# Patient Record
Sex: Female | Born: 1960
Health system: Northeastern US, Community
[De-identification: ages and names within clinical notes are randomized; demographics above are authoritative.]

## PROBLEM LIST (undated history)

## (undated) ENCOUNTER — Ambulatory Visit (HOSPITAL_BASED_OUTPATIENT_CLINIC_OR_DEPARTMENT_OTHER): Payer: Self-pay | Admitting: Orthopaedic Surgery

## (undated) ENCOUNTER — Encounter (HOSPITAL_BASED_OUTPATIENT_CLINIC_OR_DEPARTMENT_OTHER): Payer: Self-pay

## (undated) DIAGNOSIS — H0102A Squamous blepharitis right eye, upper and lower eyelids: Principal | ICD-10-CM

## (undated) DIAGNOSIS — F32A Depression, unspecified: Secondary | ICD-10-CM

## (undated) DIAGNOSIS — G8929 Other chronic pain: Secondary | ICD-10-CM

## (undated) DIAGNOSIS — I10 Essential (primary) hypertension: Secondary | ICD-10-CM

## (undated) DIAGNOSIS — M7541 Impingement syndrome of right shoulder: Secondary | ICD-10-CM

## (undated) DIAGNOSIS — H5200 Hypermetropia, unspecified eye: Secondary | ICD-10-CM

## (undated) DIAGNOSIS — H0102B Squamous blepharitis left eye, upper and lower eyelids: Principal | ICD-10-CM

## (undated) DIAGNOSIS — G4733 Obstructive sleep apnea (adult) (pediatric): Secondary | ICD-10-CM

## (undated) DIAGNOSIS — M25511 Pain in right shoulder: Secondary | ICD-10-CM

## (undated) DIAGNOSIS — F329 Major depressive disorder, single episode, unspecified: Secondary | ICD-10-CM

## (undated) DIAGNOSIS — H52209 Unspecified astigmatism, unspecified eye: Secondary | ICD-10-CM

## (undated) DIAGNOSIS — R112 Nausea with vomiting, unspecified: Secondary | ICD-10-CM

## (undated) DIAGNOSIS — Z9889 Other specified postprocedural states: Secondary | ICD-10-CM

## (undated) DIAGNOSIS — B659 Schistosomiasis, unspecified: Secondary | ICD-10-CM

## (undated) DIAGNOSIS — D259 Leiomyoma of uterus, unspecified: Secondary | ICD-10-CM

## (undated) DIAGNOSIS — R7309 Other abnormal glucose: Secondary | ICD-10-CM

## (undated) DIAGNOSIS — Z411 Encounter for cosmetic surgery: Secondary | ICD-10-CM

## (undated) DIAGNOSIS — R2230 Localized swelling, mass and lump, unspecified upper limb: Secondary | ICD-10-CM

## (undated) DIAGNOSIS — N3289 Other specified disorders of bladder: Secondary | ICD-10-CM

## (undated) DIAGNOSIS — H524 Presbyopia: Secondary | ICD-10-CM

## (undated) DIAGNOSIS — K5730 Diverticulosis of large intestine without perforation or abscess without bleeding: Secondary | ICD-10-CM

## (undated) DIAGNOSIS — H269 Unspecified cataract: Principal | ICD-10-CM

## (undated) DIAGNOSIS — N209 Urinary calculus, unspecified: Secondary | ICD-10-CM

## (undated) HISTORY — DX: Presbyopia: H52.4

## (undated) HISTORY — DX: Encounter for cosmetic surgery: Z41.1

## (undated) HISTORY — DX: Other chronic pain: G89.29

## (undated) HISTORY — PX: MASTOPEXY: REP19

## (undated) HISTORY — DX: Urinary calculus, unspecified: N20.9

## (undated) HISTORY — DX: Squamous blepharitis left eye, upper and lower eyelids: H01.02B

## (undated) HISTORY — DX: Schistosomiasis, unspecified: B65.9

## (undated) HISTORY — PX: TOTAL ABDOMINAL HYSTERECT W/WO RMVL TUBE OVARY: REP152

## (undated) HISTORY — DX: Pain in right shoulder: M25.511

## (undated) HISTORY — DX: Diverticulosis of large intestine without perforation or abscess without bleeding: K57.30

## (undated) HISTORY — PX: OB ANTEPARTUM CARE CESAREAN DLVR & POSTPARTUM: REP299

## (undated) HISTORY — PX: MAMMAPLASTY AUGMENTATION W/PROSTHETIC IMPLANT: REP22

## (undated) HISTORY — PX: TOTAL KNEE REPLACEMENT: 100043

## (undated) HISTORY — PX: BREAST ENHANCEMENT SURGERY: SHX7

## (undated) HISTORY — DX: Unspecified astigmatism, unspecified eye: H52.209

## (undated) HISTORY — DX: Localized swelling, mass and lump, unspecified upper limb: R22.30

## (undated) HISTORY — DX: Other specified disorders of bladder: N32.89

## (undated) HISTORY — DX: Essential (primary) hypertension: I10

## (undated) HISTORY — PX: REDUCTION MAMMAPLASTY: REP20

## (undated) HISTORY — DX: Unspecified cataract: H26.9

## (undated) HISTORY — DX: Squamous blepharitis right eye, upper and lower eyelids: H01.02A

## (undated) HISTORY — DX: Leiomyoma of uterus, unspecified: D25.9

## (undated) HISTORY — DX: Hypermetropia, unspecified eye: H52.00

## (undated) HISTORY — DX: Impingement syndrome of right shoulder: M75.41

## (undated) HISTORY — DX: Other abnormal glucose: R73.09

## (undated) SURGERY — ARTHROSCOPY, SHOULDER, WITH ROTATOR CUFF REPAIR
Anesthesia: General w/ Block | Laterality: Right

## (undated) SURGERY — ROBOT-ASSISTED, ARTHROPLASTY, KNEE, TOTAL, USING MAKO SYSTEM
Anesthesia: Spinal with Block | Laterality: Right

---
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
## 1999-08-10 ENCOUNTER — Emergency Department (HOSPITAL_BASED_OUTPATIENT_CLINIC_OR_DEPARTMENT_OTHER): Payer: Self-pay | Admitting: Emergency Medicine

## 1999-08-12 ENCOUNTER — Ambulatory Visit: Payer: Self-pay | Admitting: Internal Medicine

## 1999-11-23 ENCOUNTER — Emergency Department (HOSPITAL_BASED_OUTPATIENT_CLINIC_OR_DEPARTMENT_OTHER): Payer: Self-pay

## 1999-11-23 LAB — AUTOMATED WBC DIFF
BASOPHIL %: 0.8 % (ref 0–2)
EOSINOPHIL %: 0.8 % (ref 0–7)
LYMPHOCYTE %: 22.6 % (ref 12–39)
MONOCYTE %: 6.9 % (ref 1–12)
NEUTROPHIL %: 68.9 % (ref 46–79)

## 1999-11-23 LAB — AMYLASE: AMYLASE: 56 U/L (ref 25–130)

## 1999-11-23 LAB — COMPLETE BLOOD COUNT
HEMATOCRIT: 34.4 % — ABNORMAL LOW (ref 37.0–47.0)
HEMOGLOBIN: 11.6 g/dL — ABNORMAL LOW (ref 12.0–16.0)
MEAN CORP HGB CONC: 33.8 g/dL (ref 32.0–36.0)
MEAN CORPUSCULAR HGB: 29.1 pg (ref 27.0–31.0)
MEAN CORPUSCULAR VOL: 85.9 fL (ref 81.0–99.0)
RBC DISTRIBUTION WIDTH: 14.6 % — ABNORMAL HIGH (ref 11.5–14.3)
RED BLOOD CELL COUNT: 4 MIL/uL — ABNORMAL LOW (ref 4.20–5.40)
WHITE BLOOD CELL COUNT: 8.2 10*3/uL (ref 4.8–10.8)

## 2000-03-17 ENCOUNTER — Ambulatory Visit: Payer: Self-pay | Admitting: Internal Medicine

## 2000-03-17 LAB — URINALYSIS
BILIRUBIN, URINE: NEGATIVE
GLUCOSE, URINE: NEGATIVE MG/DL
KETONE, URINE: NEGATIVE MG/DL
LEUKOCYTE ESTERASE: NEGATIVE
NITRITE, URINE: NEGATIVE
OCCULT BLOOD, URINE: NEGATIVE
PH URINE: 6.5 (ref 5.0–8.0)
PROTEIN, URINE: NEGATIVE MG/DL
SPECIFIC GRAVITY URINE: 1.025 (ref 1.003–1.035)

## 2000-03-17 LAB — URINE CULTURE/COLONY COUNT

## 2000-03-17 LAB — CHG CYTP SLIDES CERV/VAG MNL SCRN PHYSICIAN SUPV

## 2000-04-21 ENCOUNTER — Ambulatory Visit: Payer: Self-pay | Admitting: Internal Medicine

## 2000-05-23 ENCOUNTER — Emergency Department (HOSPITAL_BASED_OUTPATIENT_CLINIC_OR_DEPARTMENT_OTHER): Payer: Self-pay | Admitting: Emergency Medicine

## 2001-05-25 ENCOUNTER — Emergency Department (HOSPITAL_BASED_OUTPATIENT_CLINIC_OR_DEPARTMENT_OTHER): Payer: Self-pay | Admitting: Emergency Medicine

## 2001-05-25 LAB — URINE CULTURE/COLONY COUNT

## 2001-05-26 LAB — CT ABDOMEN WO IV CONTRAST

## 2001-05-26 LAB — CT PELVIS WO CONTRAST

## 2002-10-06 LAB — COMPLETE BLOOD COUNT
HEMATOCRIT: 28.9 % — ABNORMAL LOW (ref 37.0–47.0)
HEMOGLOBIN: 9.8 g/dL — ABNORMAL LOW (ref 12.0–16.0)
MEAN CORP HGB CONC: 34 g/dL (ref 32.0–36.0)
MEAN CORPUSCULAR HGB: 28.3 pg (ref 27.0–31.0)
MEAN CORPUSCULAR VOL: 83.1 fL (ref 81.0–99.0)
RBC DISTRIBUTION WIDTH: 16.4 % — ABNORMAL HIGH (ref 11.5–14.3)
RED BLOOD CELL COUNT: 3.48 MIL/uL — ABNORMAL LOW (ref 4.20–5.40)
WHITE BLOOD CELL COUNT: 19.6 10*3/uL — ABNORMAL HIGH (ref 4.8–10.8)

## 2002-10-06 LAB — BASIC METABOLIC PANEL
BUN (UREA NITROGEN): 3 mg/dl — ABNORMAL LOW (ref 10–20)
CALCIUM: 8.7 mg/dl (ref 8.5–10.5)
CARBON DIOXIDE: 20 mEQ/L — ABNORMAL LOW (ref 22–32)
CHLORIDE: 113 mEQ/L — ABNORMAL HIGH (ref 98–110)
CREATININE: 0.9 mg/dl (ref 0.8–1.2)
Glucose Random: 91 mg/dl (ref 65–160)
POTASSIUM: 3.4 mEQ/L — ABNORMAL LOW (ref 3.5–5.0)
SODIUM: 137 mEQ/L (ref 135–145)

## 2002-10-06 LAB — BLOOD COUNT COMPLETE AUTO&AUTO DIFRNTL WBC
BASOPHIL %: 0.6 % (ref 0–2)
EOSINOPHIL %: 0 % (ref 0–7)
HEMATOCRIT: 29.5 % — ABNORMAL LOW (ref 37.0–47.0)
HEMOGLOBIN: 10.1 g/dL — ABNORMAL LOW (ref 12.0–16.0)
LYMPHOCYTE %: 7.3 % — ABNORMAL LOW (ref 12–39)
MEAN CORP HGB CONC: 34.3 g/dL (ref 32.0–36.0)
MEAN CORPUSCULAR HGB: 28.3 pg (ref 27.0–31.0)
MEAN CORPUSCULAR VOL: 82.6 fL (ref 81.0–99.0)
MEAN PLATELET VOLUME: 8.6 fL (ref 6.4–10.8)
MONOCYTE %: 6.4 % (ref 1–12)
NEUTROPHIL %: 85.7 % — ABNORMAL HIGH (ref 46–79)
PLATELET COUNT: 311 10*3/uL (ref 150–400)
RBC DISTRIBUTION WIDTH: 16.7 % — ABNORMAL HIGH (ref 11.5–14.3)
RED BLOOD CELL COUNT: 3.58 MIL/uL — ABNORMAL LOW (ref 4.20–5.40)
WHITE BLOOD CELL COUNT: 18.3 10*3/uL — ABNORMAL HIGH (ref 4.8–10.8)

## 2002-10-06 LAB — URINALYSIS
BACTERIA: >50 PER HPF — ABNORMAL HIGH (ref 0–?)
BILIRUBIN, URINE: NEGATIVE
CASTS: NONE SEEN PER LPF
CRYSTALS: NONE SEEN
GLUCOSE, URINE: NEGATIVE MG/DL
LEUKOCYTE ESTERASE: NEGATIVE
NITRITE, URINE: POSITIVE — ABNORMAL HIGH
PH URINE: 6 (ref 5.0–8.0)
PROTEIN, URINE: 30 MG/DL — ABNORMAL HIGH
SPECIFIC GRAVITY URINE: 1.03 (ref 1.003–1.035)
SQUAMOUS EPITHELIAL CELLS: >10 PER LPF — ABNORMAL HIGH (ref 0–2)

## 2002-10-06 LAB — COMPREHENSIVE METABOLIC PANEL
ALANINE AMINOTRANSFERASE: 35 IU/L (ref 3–36)
ALBUMIN: 2.9 g/dl — ABNORMAL LOW (ref 3.5–5.0)
ALKALINE PHOSPHATASE: 78 IU/L (ref 50–136)
ASPARTATE AMINOTRANSFERASE: 37 U/L — ABNORMAL HIGH (ref 7–29)
BILIRUBIN TOTAL: 0.3 mg/dl (ref 0.15–1.0)
BUN (UREA NITROGEN): 6 mg/dl — ABNORMAL LOW (ref 10–20)
CALCIUM: 8.4 mg/dl — ABNORMAL LOW (ref 8.5–10.5)
CARBON DIOXIDE: 21 mEQ/L — ABNORMAL LOW (ref 22–32)
CHLORIDE: 111 mEQ/L — ABNORMAL HIGH (ref 98–110)
CREATININE: 0.8 mg/dl (ref 0.8–1.2)
Glucose Random: 95 mg/dl (ref 65–160)
POTASSIUM: 3.7 mEQ/L (ref 3.5–5.0)
SODIUM: 138 mEQ/L (ref 135–145)
TOTAL PROTEIN: 6.4 g/dl (ref 6.2–8.5)

## 2002-10-06 LAB — AMYLASE: AMYLASE: 37 U/L (ref 25–130)

## 2002-10-06 LAB — RBCMORPH: RED BLOOD CELL MORPHOLOGY: NEGATIVE

## 2002-10-06 LAB — WBC DIFFERENTIAL SCAN

## 2002-10-06 LAB — ICTOTEST: ICTOTEST: NEGATIVE

## 2004-06-18 ENCOUNTER — Encounter (HOSPITAL_BASED_OUTPATIENT_CLINIC_OR_DEPARTMENT_OTHER): Payer: Self-pay | Admitting: Family Medicine

## 2004-06-18 ENCOUNTER — Ambulatory Visit (HOSPITAL_BASED_OUTPATIENT_CLINIC_OR_DEPARTMENT_OTHER): Payer: Self-pay | Admitting: Family Medicine

## 2004-06-18 VITALS — BP 120/80 | Ht 59.5 in | Wt 139.0 lb

## 2004-06-18 DIAGNOSIS — D649 Anemia, unspecified: Secondary | ICD-10-CM

## 2004-06-18 DIAGNOSIS — Z23 Encounter for immunization: Secondary | ICD-10-CM

## 2004-06-18 DIAGNOSIS — Z0000 Encounter for general adult medical examination without abnormal findings: Principal | ICD-10-CM

## 2004-06-18 DIAGNOSIS — N209 Urinary calculus, unspecified: Secondary | ICD-10-CM

## 2004-06-18 HISTORY — DX: Urinary calculus, unspecified: N20.9

## 2004-06-18 LAB — BLOOD COUNT COMPLETE AUTOMATED
HEMATOCRIT: 28.5 % — ABNORMAL LOW (ref 37.0–47.0)
HEMOGLOBIN: 8.8 g/dL — ABNORMAL LOW (ref 12.0–16.0)
MEAN CORP HGB CONC: 30.8 g/dL — ABNORMAL LOW (ref 32.0–36.0)
MEAN CORPUSCULAR HGB: 22 pg — ABNORMAL LOW (ref 27.0–31.0)
MEAN CORPUSCULAR VOL: 71.5 fL — ABNORMAL LOW (ref 81.0–99.0)
MEAN PLATELET VOLUME: 9.6 fL (ref 6.4–10.8)
PLATELET COUNT: 354 10*3/uL (ref 150–400)
RBC DISTRIBUTION WIDTH: 19 % — ABNORMAL HIGH (ref 11.5–14.3)
RED BLOOD CELL COUNT: 3.98 MIL/uL — ABNORMAL LOW (ref 4.20–5.40)
WHITE BLOOD CELL COUNT: 8.3 10*3/uL (ref 4.8–10.8)

## 2004-06-18 LAB — URINALYSIS
BILIRUBIN, URINE: NEGATIVE
GLUCOSE, URINE: NEGATIVE MG/DL
KETONE, URINE: NEGATIVE MG/DL
LEUKOCYTE ESTERASE: NEGATIVE
NITRITE, URINE: NEGATIVE
OCCULT BLOOD, URINE: NEGATIVE
PH URINE: 5 (ref 5.0–8.0)
PROTEIN, URINE: NEGATIVE MG/DL
SPECIFIC GRAVITY URINE: 1.025 (ref 1.003–1.035)

## 2004-06-18 LAB — CYTOPATH, C/V, THIN LAYER

## 2004-06-18 NOTE — Nursing Note (Signed)
>>   GESING, SUSAN     06/18/2004   4:29 pm  vis given

## 2004-06-19 LAB — COMPREHENSIVE METABOLIC PANEL
ALANINE AMINOTRANSFERASE: 22 IU/L (ref 7–35)
ALBUMIN: 4.2 g/dl (ref 3.4–4.8)
ALKALINE PHOSPHATASE: 53 IU/L (ref 25–106)
ASPARTATE AMINOTRANSFERASE: 27 IU/L (ref 8–34)
BILIRUBIN TOTAL: 0.6 mg/dl (ref 0.2–1.1)
BUN (UREA NITROGEN): 13 mg/dl (ref 6–20)
CALCIUM: 9.7 mg/dl (ref 8.6–10.0)
CARBON DIOXIDE: 27 mmol/L (ref 22–32)
CHLORIDE: 102 mmol/L (ref 101–111)
CREATININE: 0.7 mg/dl (ref 0.4–1.2)
Glucose Random: 79 mg/dl (ref 74–160)
POTASSIUM: 3.8 mmol/L (ref 3.5–5.1)
SODIUM: 136 mmol/L (ref 135–144)
TOTAL PROTEIN: 7.8 g/dl — ABNORMAL HIGH (ref 5.9–7.5)

## 2004-06-19 LAB — CHG LIPID PANEL
Cholesterol: 216 mg/dl — ABNORMAL HIGH (ref 0–200)
HIGH DENSITY LIPOPROTEIN: 60 mg/dl (ref 35–85)
LOW DENSITY LIPOPROTEIN DIRECT: 146 mg/dl — ABNORMAL HIGH (ref 0–100)
RISK FACTOR: 3.6 (ref ?–4.4)
TRIGLYCERIDES: 96 mg/dl (ref 0–150)

## 2004-06-19 LAB — TSH (THYROID STIMULATING HORMONE): TSH (THYROID STIM HORMONE): 0.91 u[IU]/mL (ref 0.34–5.60)

## 2004-06-19 LAB — IADNA CHLAMYDIA TRACHOMATIS DIRECT PROBE TQ: GENPROBE CHLAMYDIA: NEGATIVE

## 2004-06-19 LAB — IADNA NEISSERIA GONORRHOEAE DIRECT PROBE TQ: GENPROBE GC: NEGATIVE

## 2004-07-03 ENCOUNTER — Ambulatory Visit (HOSPITAL_BASED_OUTPATIENT_CLINIC_OR_DEPARTMENT_OTHER): Payer: Self-pay | Admitting: Family Medicine

## 2004-07-03 VITALS — BP 110/70 | Ht 59.5 in | Wt 138.5 lb

## 2004-07-03 DIAGNOSIS — N939 Abnormal uterine and vaginal bleeding, unspecified: Secondary | ICD-10-CM

## 2004-07-03 DIAGNOSIS — E7800 Pure hypercholesterolemia, unspecified: Secondary | ICD-10-CM | POA: Insufficient documentation

## 2004-07-03 DIAGNOSIS — D649 Anemia, unspecified: Principal | ICD-10-CM

## 2004-07-03 DIAGNOSIS — E6609 Other obesity due to excess calories: Secondary | ICD-10-CM

## 2004-07-03 DIAGNOSIS — IMO0002 Reserved for concepts with insufficient information to code with codable children: Secondary | ICD-10-CM

## 2004-07-03 DIAGNOSIS — E669 Obesity, unspecified: Secondary | ICD-10-CM

## 2004-07-03 DIAGNOSIS — Z6832 Body mass index (BMI) 32.0-32.9, adult: Secondary | ICD-10-CM

## 2004-07-03 DIAGNOSIS — Z6833 Body mass index (BMI) 33.0-33.9, adult: Secondary | ICD-10-CM | POA: Insufficient documentation

## 2004-07-03 DIAGNOSIS — N926 Irregular menstruation, unspecified: Secondary | ICD-10-CM

## 2004-07-03 DIAGNOSIS — Z800 Family history of malignant neoplasm of digestive organs: Secondary | ICD-10-CM

## 2004-07-03 LAB — CHG ASSAY OF FERRITIN: FERRITIN: 5 ng/ml — ABNORMAL LOW (ref 11–307)

## 2004-07-03 LAB — CYANOCOBALAMIN VITAMIN B-12: VITAMIN B12: 207 pg/ml (ref 180–914)

## 2004-07-03 LAB — IRON: IRON: 10 ug/dl — CL (ref 28–170)

## 2004-07-03 LAB — CHG ASSAY OF FOLIC ACID SERUM: FOLATE: 11.7 ng/ml (ref 3.0–?)

## 2004-07-03 MED ORDER — TRAMADOL HCL 50 MG PO TABS
ORAL_TABLET | ORAL | Status: DC
Start: 2004-07-03 — End: 2005-01-01

## 2004-07-03 NOTE — Progress Notes (Signed)
Addended by: Rolene Course on: 07/31/2004 4:03:28 PM     Modules accepted: Orders

## 2004-07-05 ENCOUNTER — Encounter (HOSPITAL_BASED_OUTPATIENT_CLINIC_OR_DEPARTMENT_OTHER): Payer: Self-pay | Admitting: Family Medicine

## 2004-07-05 ENCOUNTER — Other Ambulatory Visit (HOSPITAL_BASED_OUTPATIENT_CLINIC_OR_DEPARTMENT_OTHER): Payer: Self-pay | Admitting: Family Medicine

## 2004-07-05 DIAGNOSIS — D649 Anemia, unspecified: Principal | ICD-10-CM

## 2004-07-05 MED ORDER — FERROUS SULFATE 325 (65 FE) MG PO TABS
ORAL_TABLET | ORAL | Status: DC
Start: 2004-07-05 — End: 2005-01-01

## 2004-07-31 ENCOUNTER — Ambulatory Visit (HOSPITAL_BASED_OUTPATIENT_CLINIC_OR_DEPARTMENT_OTHER): Payer: Self-pay | Admitting: Family Medicine

## 2004-07-31 VITALS — BP 134/76 | Ht 59.5 in | Wt 139.0 lb

## 2004-07-31 DIAGNOSIS — N939 Abnormal uterine and vaginal bleeding, unspecified: Secondary | ICD-10-CM

## 2004-07-31 DIAGNOSIS — E7800 Pure hypercholesterolemia, unspecified: Secondary | ICD-10-CM

## 2004-07-31 DIAGNOSIS — IMO0002 Reserved for concepts with insufficient information to code with codable children: Secondary | ICD-10-CM

## 2004-07-31 DIAGNOSIS — D649 Anemia, unspecified: Principal | ICD-10-CM

## 2004-07-31 DIAGNOSIS — E669 Obesity, unspecified: Secondary | ICD-10-CM

## 2004-07-31 DIAGNOSIS — N926 Irregular menstruation, unspecified: Secondary | ICD-10-CM

## 2004-07-31 LAB — FECAL OCCULT (POINT OF CARE) OFFICE/INPATIENT TEST 1 CARD
LOT #: 940
OCCULT BLOOD: NEGATIVE
OCCULT BLOOD: NEGATIVE
OCCULT BLOOD: NEGATIVE

## 2004-07-31 LAB — CHG GONADOTROPIN FOLLICLE STIMULATING HORMONE: FOLLICLE STIMULATING HORMONE: 6.05 m[IU]/mL

## 2004-07-31 MED ORDER — PIROXICAM 20 MG PO CAPS
ORAL_CAPSULE | ORAL | Status: DC
Start: 2004-07-31 — End: 2005-01-01

## 2004-07-31 MED ORDER — CYCLOBENZAPRINE HCL 10 MG PO TABS
ORAL_TABLET | ORAL | Status: DC
Start: 2004-07-31 — End: 2005-01-01

## 2004-07-31 MED ORDER — FERROUS SULFATE 325 (65 FE) MG PO TABS
ORAL_TABLET | ORAL | Status: AC
Start: 2004-07-31 — End: 2005-07-31

## 2004-07-31 NOTE — Patient Instructions (Signed)
Fazer a radiografia e o ultrasom.  Volte daqui a 2-3 meses, ou antes se for preciso

## 2004-07-31 NOTE — Progress Notes (Signed)
Quick Note:    Await ultrasound results, scheduled for 08/28/04  ______

## 2004-07-31 NOTE — Progress Notes (Signed)
Stephanie [name] is a 43 year old female with the following Problems and Medications.    Patient Active Problem List:    ANEMIA NOS[285.9]   URINARY CALCULUS NOS[592.9]   OBESITY NOS[278.00]   PURE HYPERCHOLESTEROLEM[272.0]   MENSTRUAL DISORDER NOS[626.9]   FAMILY HX GI MALIGNANCY[V16.0]    Current outpatient prescriptions:  TRAMADOL HCL 50 MG OR TABS, 1-2 TABLET FOUR TIMES A DAY AS NEEDED for pain, Disp: 30, Rfl: 2    Tried ibuprofen (Motrin, Advil) for pain but still with pain right shoulder and arm, increased with activity; hasn't really been able to rest it; causing her to not sleep well.  Did not receive iron Rx due to change in address.  Last visit Diet info given in appropriate language to decrease wt and cholesterol; trying to comply.    Otherwise Generally feels well; no chest pain, no shortness of breath, no DOE, no fever, no GI sx, no focal neuro sx.    OBJECTIVE:  NAD, alert, well developed, well nourished  Shoulder exam - both sides normal; full range of motion, no pain on motion, no tenderness or deformity noted.   Hem neg cards x3  ASSESSMENT:  285.9 ANEMIA NOS (primary encounter diagnosis)  Note: will rx with:  Plan: FERROUS SULFATE 325 MG OR TABS       278.00 OBESITY NOS  Note: Counselled regarding TLC to lose wt and lower cholesterol   Plan:     272.0 PURE HYPERCHOLESTEROLEM  Note: counselled as above   Plan:     626.9 MENSTRUAL DISORDER NOS  Note: irreg menses; confirm she's not menopausal, check ultrasound 12/30; consider OCP if normal  Plan: GONADOTROPIN [facility]), ROUTINE VENIPUNCTURE       840.9 SPRAIN SHOULDER/ARM NOS  Note:   Plan: PIROXICAM 20 MG OR CAPS, CYCLOBENZAPRINE HCL 10   MG OR TABS     Spent 15 minutes counselling patient regarding the above.

## 2004-08-18 ENCOUNTER — Other Ambulatory Visit: Payer: Self-pay | Admitting: Family Medicine

## 2004-08-18 DIAGNOSIS — M79609 Pain in unspecified limb: Secondary | ICD-10-CM

## 2004-08-18 DIAGNOSIS — M19019 Primary osteoarthritis, unspecified shoulder: Principal | ICD-10-CM

## 2004-08-19 LAB — XR SHOULDER RIGHT MINIMUM 2 VIEWS

## 2004-08-19 LAB — XR HUMERUS RIGHT MINIMUM 2 VIEWS

## 2004-08-21 ENCOUNTER — Encounter (HOSPITAL_BASED_OUTPATIENT_CLINIC_OR_DEPARTMENT_OTHER): Payer: Self-pay | Admitting: Family Medicine

## 2004-08-28 ENCOUNTER — Encounter: Payer: Self-pay | Admitting: Family Medicine

## 2004-11-04 ENCOUNTER — Ambulatory Visit (HOSPITAL_BASED_OUTPATIENT_CLINIC_OR_DEPARTMENT_OTHER): Payer: MEDICAID | Admitting: Family Medicine

## 2004-11-04 VITALS — BP 120/84 | HR 80 | Temp 98.4°F | Resp 18 | Ht 59.7 in | Wt 142.0 lb

## 2004-11-04 DIAGNOSIS — N926 Irregular menstruation, unspecified: Secondary | ICD-10-CM

## 2004-11-04 DIAGNOSIS — G5600 Carpal tunnel syndrome, unspecified upper limb: Principal | ICD-10-CM

## 2004-11-04 DIAGNOSIS — E669 Obesity, unspecified: Secondary | ICD-10-CM

## 2004-11-04 DIAGNOSIS — E7800 Pure hypercholesterolemia, unspecified: Secondary | ICD-10-CM

## 2004-11-04 DIAGNOSIS — N939 Abnormal uterine and vaginal bleeding, unspecified: Secondary | ICD-10-CM

## 2004-11-04 DIAGNOSIS — D649 Anemia, unspecified: Secondary | ICD-10-CM

## 2004-11-04 LAB — BLOOD COUNT COMPLETE AUTOMATED
HEMATOCRIT: 34.6 % — ABNORMAL LOW (ref 37.0–47.0)
HEMOGLOBIN: 11.3 g/dL — ABNORMAL LOW (ref 12.0–16.0)
MEAN CORP HGB CONC: 32.7 g/dL (ref 32.0–36.0)
MEAN CORPUSCULAR HGB: 26.2 pg — ABNORMAL LOW (ref 27.0–31.0)
MEAN CORPUSCULAR VOL: 80.3 fL — ABNORMAL LOW (ref 81.0–99.0)
MEAN PLATELET VOLUME: 9.7 fL (ref 6.4–10.8)
PLATELET COUNT: 355 10*3/uL (ref 150–400)
RBC DISTRIBUTION WIDTH: 21.4 % (ref 11.5–14.3)
RED BLOOD CELL COUNT: 4.31 MIL/uL (ref 4.20–5.40)
WHITE BLOOD CELL COUNT: 9.9 10*3/uL (ref 4.8–10.8)

## 2004-11-04 NOTE — Patient Instructions (Signed)
Para dor: Descanso, piroxicam quando precisar, use o Carpal Tunnel Splint.

## 2004-11-04 NOTE — Progress Notes (Signed)
2 week hx right hand pain, numb at night with sleeping, also right elbow and arm, shoulder.  Pain increased after work, cleaning.  No other neuro sx; Sleeps well, No hx trauma.   Also, missed appt for ultrasound; need to reschedule.  Also, hx anemia, was taking iron; will recheck.  Counselled regarding TLC to lose wt and lower cholesterol; has actually gained wt since last visit.  Otherwise Generally feels well; no chest pain, no shortness of breath, no DOE, no fever, no GI sx, no focal neuro sx.    OBJECTIVE:  BP 120/84  Pulse 80  Temp (Src) 98.4 (Oral)  Resp 18  Ht 4' 11.7" (1.58m)  Wt 142 lbs (64.4kg)  Body mass index is 28.03 kg/(m^2).  Hand exam - both sides normal, full range of motion of all joints, no swelling, tenderness or deformities. There is normal motor, sensory, vascular and tendon function.   Exam for Carpal Tunnel syndrome shows both sides negative - Tinel and Phalen tests are negative, normal sensation, no atrophy.     ASSESSMENT:  354.0 CARPAL TUNNEL SYNDROME (primary encounter diagnosis)  Note: See Orders and Patient Instructions.   Plan:     278.00 OBESITY NOS  Note:   Plan: REFERRAL TO NUTRITION (INT)       272.0 PURE HYPERCHOLESTEROLEM  Note:   Plan: REFERRAL TO NUTRITION (INT)       626.9 MENSTRUAL DISORDER NOS  Note: check ultrasound 3/27  Plan:     285.9 ANEMIA NOS  Note: will recheckPlan: COMPLETE CBC, AUTOMATED

## 2004-11-23 ENCOUNTER — Other Ambulatory Visit: Payer: Self-pay | Admitting: Family Medicine

## 2004-11-23 DIAGNOSIS — D259 Leiomyoma of uterus, unspecified: Secondary | ICD-10-CM

## 2004-11-23 DIAGNOSIS — N926 Irregular menstruation, unspecified: Principal | ICD-10-CM

## 2004-11-23 DIAGNOSIS — D649 Anemia, unspecified: Secondary | ICD-10-CM

## 2004-11-25 LAB — US TRANSVAGINAL NON-OB

## 2004-11-25 LAB — US PELVIC NON-PREGNANT

## 2004-12-14 ENCOUNTER — Encounter (HOSPITAL_BASED_OUTPATIENT_CLINIC_OR_DEPARTMENT_OTHER): Payer: Self-pay | Admitting: Family Medicine

## 2005-01-01 ENCOUNTER — Ambulatory Visit (HOSPITAL_BASED_OUTPATIENT_CLINIC_OR_DEPARTMENT_OTHER): Payer: Charity | Admitting: Family Medicine

## 2005-01-01 VITALS — BP 100/76 | HR 72 | Ht 60.0 in | Wt 143.5 lb

## 2005-01-01 DIAGNOSIS — N939 Abnormal uterine and vaginal bleeding, unspecified: Secondary | ICD-10-CM

## 2005-01-01 DIAGNOSIS — E669 Obesity, unspecified: Secondary | ICD-10-CM

## 2005-01-01 DIAGNOSIS — D649 Anemia, unspecified: Secondary | ICD-10-CM

## 2005-01-01 DIAGNOSIS — D259 Leiomyoma of uterus, unspecified: Principal | ICD-10-CM

## 2005-01-01 DIAGNOSIS — N926 Irregular menstruation, unspecified: Secondary | ICD-10-CM

## 2005-01-01 DIAGNOSIS — E7800 Pure hypercholesterolemia, unspecified: Secondary | ICD-10-CM

## 2005-01-01 LAB — BLOOD COUNT COMPLETE AUTOMATED
HEMATOCRIT: 34.7 % — ABNORMAL LOW (ref 37.0–47.0)
HEMOGLOBIN: 11.3 g/dL — ABNORMAL LOW (ref 12.0–16.0)
MEAN CORP HGB CONC: 32.6 g/dL (ref 32.0–36.0)
MEAN CORPUSCULAR HGB: 26.8 pg — ABNORMAL LOW (ref 27.0–31.0)
MEAN CORPUSCULAR VOL: 82.1 fL (ref 81.0–99.0)
MEAN PLATELET VOLUME: 9.4 fL (ref 6.4–10.8)
PLATELET COUNT: 349 10*3/uL (ref 150–400)
RBC DISTRIBUTION WIDTH: 16.3 % — ABNORMAL HIGH (ref 11.5–14.3)
RED BLOOD CELL COUNT: 4.22 MIL/uL (ref 4.20–5.40)
WHITE BLOOD CELL COUNT: 10.3 10*3/uL (ref 4.8–10.8)

## 2005-01-07 ENCOUNTER — Ambulatory Visit (HOSPITAL_BASED_OUTPATIENT_CLINIC_OR_DEPARTMENT_OTHER): Payer: Charity | Admitting: Registered"

## 2005-01-07 VITALS — Ht 59.84 in | Wt 146.0 lb

## 2005-01-07 DIAGNOSIS — E669 Obesity, unspecified: Secondary | ICD-10-CM

## 2005-01-07 DIAGNOSIS — E7800 Pure hypercholesterolemia, unspecified: Principal | ICD-10-CM

## 2005-01-07 DIAGNOSIS — D649 Anemia, unspecified: Secondary | ICD-10-CM

## 2005-01-07 NOTE — Progress Notes (Signed)
INITIAL NUTRITION ASSESSMENT / INTERVENTION     Total Minutes: 60 minutes      Stephanie [name] is a 44 year old female who presents with obesity and high cholesterol. Visit via [place], [place]. The patient is from [place]. Patient's language is [place]; has [place] ethnic background. She has been in the [place] for 5 years. 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 Patient lives with friends.     Recent dietary changes reported by the patient: trying to eat more salad    Review of patient's histories:   Nutrition Referral  Problem List  Most Recent Encounter  Medical History      Review of patient's family history indicates:   Cancer - Other Father    Comment: stomach        Patient has had amemia for 5 years and has not had Medical Nutrition Therapy.    Current outpatient prescriptions:  FERROUS SULFATE 325 MG OR TABS, 1 TABLET THREE TIMES A DAY FOR ANEMIA, Disp: 100, Rfl: 1 YR      Vitamins/Minerals: no    Herbal Supplements: none     Tobacco Use: Never    Alcohol Use: Yes 10 oz/week   Comment: weekends        No Known Allergies.    Labs:  HGB 11.3 01/01/2005  HCT 34.7 01/01/2005  FERRITIN 5 07/03/2004  CHOLESTEROL 216 06/18/2004  LDL 146 06/18/2004  HDL 60 06/18/2004  TG 96 06/18/2004  GLUCOSER 79 06/18/2004  SPEGRAVURINE 1.025 06/18/2004  TSH 0.91 06/18/2004    Vitals:  Most Recent BP Reading(s)   Date: BP:   01/01/2005 100/76     Most Recent Pulse Reading(s)   Date: Pulse:   01/01/2005 72     Most Recent Temp Reading(s)   Date: Temp: Temp Src:   11/04/2004 98.4 Oral      Most Recent Height Reading(s)   Date: Ht:   01/07/2005 4' 11.84"     Most Recent Weight Reading(s)   Date: Wt:   01/07/2005 146 lbs (66.225 kg)    Body mass index is 28.66 kg/(m^2).   BMI Category: 25-29.9 overweight    Highest weight: 157 lbs   Lowest weight: 138 lbs   Desired weight: lose 10 lbs     Physical Activity:  Type: housework     Barriers to physical activity include:  none    Activities of Daily Living: independent     Chewing ability: NL Swallowing ability: NL Appetite: good    Food Allergies: NKFA  Food Intolerances: none   Food Aversions: none  Religious restrictions / Special diet: none  Food purchased by: pt   Food prepared by: pt and friend  Meal sources: home cooked  Economic factors and food assist: none  Psychosocial factors / Mental Status: good  Stress: medium  Readiness to learn: good    Dietary history / usual intake and Food frequency reveals patient follows no restrictions in diet. She eats 2 meals per day.    Food intake:   deficient foods: deficient intake of dairy, fruits, whole grains, vegetables and monosaturated fats  excessive intake of starch / carbohydrates and sweetened beverages      Portion size is excessive.  Eating frequency is deficient  Eating habits are culturally based [place]    Estimated intake shows:   deficient nutrients: monosaturated fats, calcium, vitamin C, folate, vitamin B6 and fiber  excessive nutrients: fat, saturated fat, trans fats, cholesterol, added sugar  and fluid calories      Discussed with patient the changes she has made by increasing vegetables. Reviewed recent labs and how food affects the levels. Encouraged more walking as able.     Client's goals: 1) plate picture 2) more vegetables and fruit 3) less rice     Material provided: tips to lower fat and cholesterol    Future educational needs: fiber    Referred to: none    Follow-up: 2 months.    Lajean Manes, MS, RD, LDN

## 2005-01-12 NOTE — Progress Notes (Signed)
Stephanie [name] is a 44 year old female with the following Problems and Medications.    Patient Active Problem List:   ANEMIA NOS [285.9]   URINARY CALCULUS NOS [592.9]   OBESITY NOS [278.00]   PURE HYPERCHOLESTEROLEM [272.0]   MENSTRUAL DISORDER NOS [626.9]   FAMILY HX GI MALIGNANCY [V16.0]    Current outpatient prescriptions:  FERROUS SULFATE 325 MG OR TABS, 1 TABLET THREE TIMES A DAY FOR ANEMIA, Disp: 100, Rfl: 1 YR    Here to follow up above.  Still irreg bleeding; mammo reviewed in detail with patient; options discussed in detail; will refer to gyn.  Counselled regarding TLC to lose wt and lower cholesterol; seeing RD; Follow up at next/future visit     Otherwise Generally feels well; no chest pain, no shortness of breath, no DOE, no fever, no GI sx, no focal neuro sx.    OBJECTIVE:  BP 100/76  Pulse 72  Ht 5\' 0"  (1.98m)  Wt 143 lbs 8.0 oz (65.1kg)  Body mass index is 28.03 kg/(m^2).  NAD, alert, well developed, well nourished    ASSESSMENT:  218.9 UTERINE LEIOMYOMA NOS (primary encounter diagnosis)  Note: counselled as noted above   Plan: REFERRAL TO GYNECOLOGY (INT)       278.00 OBESITY NOS  Note: counselled as noted above   Plan:     272.0 PURE HYPERCHOLESTEROLEM  Note: Follow up at next/future visit   Plan:     626.9 MENSTRUAL DISORDER NOS  Note:   Plan: REFERRAL TO GYNECOLOGY (INT)       285.9 ANEMIA NOS  Note: will recheck   Plan: COMPLETE CBC, AUTOMATED, ROUTINE VENIPUNCTURE     Spent 15 minutes counselling patient regarding the above.

## 2005-02-07 ENCOUNTER — Inpatient Hospital Stay (HOSPITAL_BASED_OUTPATIENT_CLINIC_OR_DEPARTMENT_OTHER): Payer: Self-pay | Admitting: Emergency Medicine

## 2005-02-07 ENCOUNTER — Other Ambulatory Visit: Payer: Self-pay | Admitting: Emergency Medicine

## 2005-02-07 LAB — COMPREHENSIVE METABOLIC PANEL
ALANINE AMINOTRANSFERASE: 21 IU/L (ref 7–35)
ALBUMIN: 3.7 g/dl (ref 3.4–4.8)
ALKALINE PHOSPHATASE: 38 IU/L (ref 25–106)
ASPARTATE AMINOTRANSFERASE: 22 IU/L (ref 8–34)
BILIRUBIN TOTAL: 0.5 mg/dl (ref 0.2–1.1)
BUN (UREA NITROGEN): 10 mg/dl (ref 6–20)
CALCIUM: 8.9 mg/dl (ref 8.6–10.0)
CARBON DIOXIDE: 25 mmol/L (ref 22–32)
CHLORIDE: 110 mmol/L (ref 101–111)
CREATININE: 0.7 mg/dl (ref 0.4–1.2)
Glucose Random: 93 mg/dl (ref 74–160)
POTASSIUM: 3.4 mmol/L — ABNORMAL LOW (ref 3.5–5.1)
SODIUM: 139 mmol/L (ref 135–144)
TOTAL PROTEIN: 6.9 g/dl (ref 5.9–7.5)

## 2005-02-07 LAB — DIFFERENTIAL WBC COUNT
BAND NEUTROPHILS %: 13 % — ABNORMAL HIGH (ref 0–8)
LYMPHOCYTES %: 16 % (ref 12–40)
MONOCYTES %: 3 % (ref 1–11)
PLATELET ESTIMATE: NORMAL
POLYMORPHONUCLEAR (SEGS) %: 68 % (ref 41–77)

## 2005-02-07 LAB — BLOOD COUNT COMPLETE AUTOMATED
HEMATOCRIT: 38.3 % (ref 37.0–47.0)
HEMOGLOBIN: 13.1 g/dL (ref 12.0–16.0)
MEAN CORP HGB CONC: 34.1 g/dL (ref 32.0–36.0)
MEAN CORPUSCULAR HGB: 29.4 pg (ref 27.0–31.0)
MEAN CORPUSCULAR VOL: 86.3 fL (ref 81.0–99.0)
MEAN PLATELET VOLUME: 8.8 fL (ref 6.4–10.8)
PLATELET COUNT: 314 10*3/uL (ref 150–400)
RBC DISTRIBUTION WIDTH: 18.9 % — ABNORMAL HIGH (ref 11.5–14.3)
RED BLOOD CELL COUNT: 4.44 MIL/uL (ref 4.20–5.40)
WHITE BLOOD CELL COUNT: 20.9 10*3/uL — ABNORMAL HIGH (ref 4.8–10.8)

## 2005-02-07 LAB — URINALYSIS
BILIRUBIN, URINE: NEGATIVE
CASTS: NONE SEEN PER LPF
CRYSTALS: NONE SEEN
GLUCOSE, URINE: NEGATIVE MG/DL
KETONE, URINE: NEGATIVE MG/DL
LEUKOCYTE ESTERASE: NEGATIVE
NITRITE, URINE: NEGATIVE
PH URINE: 5.5 (ref 5.0–8.0)
PROTEIN, URINE: NEGATIVE MG/DL
SPECIFIC GRAVITY URINE: 1.027 (ref 1.003–1.035)

## 2005-02-07 LAB — LIPASE: LIPASE: 29 U/L (ref 10–50)

## 2005-02-07 LAB — AMYLASE: AMYLASE: 66 U/L (ref 15–133)

## 2005-02-07 LAB — WBC DIFFERENTIAL SCAN

## 2005-02-07 LAB — APTT: APTT: 20.2 SECONDS — ABNORMAL LOW (ref 22.0–32.8)

## 2005-02-07 LAB — TYPE AND SCREEN

## 2005-02-07 LAB — PATIENT ABO/RH CONFIRM

## 2005-02-07 LAB — PROTHROMBIN TIME
INR: 1 — ABNORMAL LOW (ref 2.0–3.5)
PROTHROMBIN TIME: 12.3 SECONDS (ref 11.5–13.5)

## 2005-02-07 LAB — XR ABDOMEN (KUB) WITH UPRIGHT AND OR DECUBITUS

## 2005-02-08 LAB — URINALYSIS
BACTERIA: NONE SEEN PER HPF (ref 0–?)
BILIRUBIN, URINE: NEGATIVE
CASTS: NONE SEEN PER LPF
CRYSTALS: NONE SEEN
GLUCOSE, URINE: NEGATIVE MG/DL
KETONE, URINE: NEGATIVE MG/DL
LEUKOCYTE ESTERASE: NEGATIVE
NITRITE, URINE: NEGATIVE
PH URINE: 7 (ref 5.0–8.0)
PROTEIN, URINE: NEGATIVE MG/DL
SPECIFIC GRAVITY URINE: 1.01 (ref 1.003–1.035)
WHITE BLOOD CELLS URINE: NONE SEEN PER HPF (ref 0–4)

## 2005-02-08 LAB — BLOOD COUNT COMPLETE AUTOMATED
HEMATOCRIT: 30.3 % — ABNORMAL LOW (ref 37.0–47.0)
HEMOGLOBIN: 10.2 g/dL — ABNORMAL LOW (ref 12.0–16.0)
MEAN CORP HGB CONC: 33.5 g/dL (ref 32.0–36.0)
MEAN CORPUSCULAR HGB: 29.2 pg (ref 27.0–31.0)
MEAN CORPUSCULAR VOL: 87.2 fL (ref 81.0–99.0)
MEAN PLATELET VOLUME: 9.3 fL (ref 6.4–10.8)
PLATELET COUNT: 251 10*3/uL (ref 150–400)
RBC DISTRIBUTION WIDTH: 19.4 % — ABNORMAL HIGH (ref 11.5–14.3)
RED BLOOD CELL COUNT: 3.48 MIL/uL — ABNORMAL LOW (ref 4.20–5.40)
WHITE BLOOD CELL COUNT: 13.3 10*3/uL — ABNORMAL HIGH (ref 4.8–10.8)

## 2005-02-08 LAB — BASIC METABOLIC PANEL
BUN (UREA NITROGEN): 3 mg/dl — ABNORMAL LOW (ref 6–20)
CALCIUM: 7.9 mg/dl — ABNORMAL LOW (ref 8.6–10.0)
CARBON DIOXIDE: 19 mmol/L — ABNORMAL LOW (ref 22–32)
CHLORIDE: 115 mmol/L — ABNORMAL HIGH (ref 101–111)
CREATININE: 0.6 mg/dl (ref 0.4–1.2)
Glucose Random: 94 mg/dl (ref 74–160)
POTASSIUM: 3.9 mmol/L (ref 3.5–5.1)
SODIUM: 139 mmol/L (ref 135–144)

## 2005-02-08 LAB — US ABDOMEN COMPLETE

## 2005-02-08 LAB — CT ABDOMEN W IV CONTRAST

## 2005-02-08 LAB — CT PELVIS W CONTRAST

## 2005-02-09 LAB — IADNA CHLAMYDIA TRACHOMATIS DIRECT PROBE TQ: GENPROBE CHLAMYDIA: NEGATIVE

## 2005-02-09 LAB — BASIC METABOLIC PANEL
BUN (UREA NITROGEN): 3 mg/dl — ABNORMAL LOW (ref 6–20)
CALCIUM: 8.1 mg/dl — ABNORMAL LOW (ref 8.6–10.0)
CARBON DIOXIDE: 17 mmol/L — ABNORMAL LOW (ref 22–32)
CHLORIDE: 115 mmol/L — ABNORMAL HIGH (ref 101–111)
CREATININE: 0.6 mg/dl (ref 0.4–1.2)
Glucose Random: 86 mg/dl (ref 74–160)
POTASSIUM: 3.6 mmol/L (ref 3.5–5.1)
SODIUM: 136 mmol/L (ref 135–144)

## 2005-02-09 LAB — BLOOD COUNT COMPLETE AUTOMATED
HEMATOCRIT: 31.7 % — ABNORMAL LOW (ref 37.0–47.0)
HEMOGLOBIN: 10.7 g/dL — ABNORMAL LOW (ref 12.0–16.0)
MEAN CORP HGB CONC: 33.8 g/dL (ref 32.0–36.0)
MEAN CORPUSCULAR HGB: 29.3 pg (ref 27.0–31.0)
MEAN CORPUSCULAR VOL: 86.8 fL (ref 81.0–99.0)
MEAN PLATELET VOLUME: 9.5 fL (ref 6.4–10.8)
PLATELET COUNT: 271 10*3/uL (ref 150–400)
RBC DISTRIBUTION WIDTH: 18.9 % — ABNORMAL HIGH (ref 11.5–14.3)
RED BLOOD CELL COUNT: 3.65 MIL/uL — ABNORMAL LOW (ref 4.20–5.40)
WHITE BLOOD CELL COUNT: 11.2 10*3/uL — ABNORMAL HIGH (ref 4.8–10.8)

## 2005-02-10 ENCOUNTER — Ambulatory Visit (HOSPITAL_BASED_OUTPATIENT_CLINIC_OR_DEPARTMENT_OTHER): Payer: Charity | Admitting: Family Medicine

## 2005-02-10 ENCOUNTER — Ambulatory Visit (HOSPITAL_BASED_OUTPATIENT_CLINIC_OR_DEPARTMENT_OTHER): Payer: Self-pay | Admitting: Family Medicine

## 2005-02-10 VITALS — BP 110/78 | HR 88 | Ht 59.75 in | Wt 143.5 lb

## 2005-02-10 DIAGNOSIS — R109 Unspecified abdominal pain: Principal | ICD-10-CM

## 2005-02-10 LAB — BLOOD COUNT COMPLETE AUTO&AUTO DIFRNTL WBC
BASOPHIL %: 0.7 % (ref 0–2)
EOSINOPHIL %: 2.9 % (ref 0–7)
HEMATOCRIT: 33.1 % — ABNORMAL LOW (ref 37.0–47.0)
HEMOGLOBIN: 10.8 g/dL — ABNORMAL LOW (ref 12.0–16.0)
LYMPHOCYTE %: 33.9 % (ref 12–39)
MEAN CORP HGB CONC: 32.7 g/dL (ref 32.0–36.0)
MEAN CORPUSCULAR HGB: 28.4 pg (ref 27.0–31.0)
MEAN CORPUSCULAR VOL: 86.9 fL (ref 81.0–99.0)
MEAN PLATELET VOLUME: 9.6 fL (ref 6.4–10.8)
MONOCYTE %: 6.4 % (ref 1–12)
NEUTROPHIL %: 56.1 % (ref 46–79)
PLATELET COUNT: 337 10*3/uL (ref 150–400)
RBC DISTRIBUTION WIDTH: 19.6 % — ABNORMAL HIGH (ref 11.5–14.3)
RED BLOOD CELL COUNT: 3.81 MIL/uL — ABNORMAL LOW (ref 4.20–5.40)
WHITE BLOOD CELL COUNT: 6.5 10*3/uL (ref 4.8–10.8)

## 2005-02-10 LAB — CHG SEDIMENTATION RATE RBC NON-AUTOMATED: RBC SEDIMENTATION RATE: 59 MM/HR (ref 0–15)

## 2005-02-10 LAB — URINE CULTURE/COLONY COUNT
PROCEDURE PROMPT: NO GROWTH
URINE CULTURE/COLONY COUNT: NO GROWTH

## 2005-02-10 LAB — HOLD RED TOP TUBE

## 2005-02-10 MED ORDER — NAPROXEN 500 MG PO TABS
ORAL_TABLET | ORAL | Status: DC
Start: 2005-02-10 — End: 2005-08-10

## 2005-02-10 NOTE — Progress Notes (Signed)
Was dicharged yesterday from [facility]; had been admitted for abdominal pain for about 2 days.  Pain started Sunday AM, 3 days ago, diffuse, constant; not related to food, decreased with [place] NSAID dipirona yesterday, no other pattern except pain increased with change in positions: sit, stand, supine.  No nausea or vomiting, no change BM but loose BMs while in hospital.  NO urinary sx, no GU sx.    OBJECTIVE:  BP 110/78   Pulse 88   Ht 4' 11.75" (1.70m)   Wt 143 lbs 8.0 oz (65.1kg)  Body mass index is 28.25 kg/(m^2).  NAD, alert, well developed, well nourished  Abdomen reveals mild tenderness in the entire abdomen area, slightly increased in RLQ but no peritoneal signs; without rebound, guarding, mass or organomegaly. Abdomen is soft and bowel sounds are normal.    ASSESSMENT:  789.00 ABDOMINAL PAIN UNSPEC SITE (primary encounter diagnosis)  Note: The patient is reassured that these symptoms do not appear to represent a serious or threatening condition, but ?etiology: abdominal wall, ?IBD (doubtful but will check lab)  Plan: COMPLETE CBC W/AUTO DIFF WBC, RBC SED RATE,    NONAUTOMATED, ROUTINE VENIPUNCTURE, NAPROXEN    500 MG OR TABS   See Orders and Patient Instructions.

## 2005-02-10 NOTE — Patient Instructions (Signed)
Para dor, tome o naproxen quando precisar, depois de comer.  Volte paras consultas com o Dr Mindi Junker e comigo em [place], ou antes se for preciso

## 2005-02-16 ENCOUNTER — Ambulatory Visit (HOSPITAL_BASED_OUTPATIENT_CLINIC_OR_DEPARTMENT_OTHER): Payer: Charity | Admitting: Obstetrics & Gynecology

## 2005-02-16 VITALS — BP 110/74 | HR 72 | Ht 59.5 in | Wt 146.0 lb

## 2005-02-16 DIAGNOSIS — N926 Irregular menstruation, unspecified: Secondary | ICD-10-CM

## 2005-02-16 DIAGNOSIS — D259 Leiomyoma of uterus, unspecified: Principal | ICD-10-CM

## 2005-02-16 DIAGNOSIS — N939 Abnormal uterine and vaginal bleeding, unspecified: Secondary | ICD-10-CM

## 2005-02-16 HISTORY — DX: Leiomyoma of uterus, unspecified: D25.9

## 2005-02-16 MED ORDER — LO/OVRAL (28) 0.3-30 MG-MCG PO TABS
ORAL_TABLET | ORAL | Status: DC
Start: 2005-02-16 — End: 2005-08-28

## 2005-02-16 NOTE — Progress Notes (Signed)
44 y/o P3 has many years of menorrhagia/dysmenorrhea, now linked to the dx of fibroid uterus. She has been taking OCP's obtained from her pharmacist-daughter in [place] to control her flow, often having to double up on the pills to achieve a result. Discussed options & pt is a good candidate for definitive surgical Rx in the form of abdominal hysterectomy.    She had 3 c-sections & we discussed the particular risk associated with this history (injury to bladder)    Pt has a housecleaning business & her partner is in [place] until the end of August; thereafter she will be more free to take a medical leave. She will return to see me nearer to that time. In the meantime I will rx OCP's that she can obtain here.

## 2005-02-23 ENCOUNTER — Ambulatory Visit (HOSPITAL_BASED_OUTPATIENT_CLINIC_OR_DEPARTMENT_OTHER): Payer: Charity | Admitting: Family Medicine

## 2005-05-12 ENCOUNTER — Encounter (HOSPITAL_BASED_OUTPATIENT_CLINIC_OR_DEPARTMENT_OTHER): Payer: Charity | Admitting: Registered"

## 2005-05-12 ENCOUNTER — Ambulatory Visit (HOSPITAL_BASED_OUTPATIENT_CLINIC_OR_DEPARTMENT_OTHER): Payer: Charity | Admitting: Registered"

## 2005-05-12 VITALS — Ht 59.84 in | Wt 140.0 lb

## 2005-05-12 DIAGNOSIS — D649 Anemia, unspecified: Secondary | ICD-10-CM

## 2005-05-12 DIAGNOSIS — E669 Obesity, unspecified: Secondary | ICD-10-CM

## 2005-05-12 DIAGNOSIS — E7800 Pure hypercholesterolemia, unspecified: Principal | ICD-10-CM

## 2005-05-18 ENCOUNTER — Encounter (HOSPITAL_BASED_OUTPATIENT_CLINIC_OR_DEPARTMENT_OTHER): Payer: Charity | Admitting: Obstetrics & Gynecology

## 2005-05-18 ENCOUNTER — Ambulatory Visit (HOSPITAL_BASED_OUTPATIENT_CLINIC_OR_DEPARTMENT_OTHER): Payer: Charity | Admitting: Obstetrics & Gynecology

## 2005-05-18 VITALS — BP 118/78 | HR 92 | Temp 98.4°F | Wt 141.0 lb

## 2005-05-18 DIAGNOSIS — D259 Leiomyoma of uterus, unspecified: Secondary | ICD-10-CM

## 2005-05-18 DIAGNOSIS — N920 Excessive and frequent menstruation with regular cycle: Principal | ICD-10-CM

## 2005-05-18 DIAGNOSIS — N946 Dysmenorrhea, unspecified: Secondary | ICD-10-CM

## 2005-05-18 NOTE — Progress Notes (Signed)
44 y/o P3 (3 c/s) returns for further consultation re hysterectomy. Pt had PPTL w/ last c-s. Discussed conservative methods, i.e. Hydrothermablation, & she is clearly not interested. She does not want to go through a procedure with an uncertain chance of success in relieving her of her bleeding & pain.    Discussed retention/removal of cervix, in particular as it might contribute to the risk of the procedure & the (low) risk of cervical disease in the next decades. She is flexible on this point. Re the ovaries, I have recommended their removal, as they may be contributing to her pain and she is near menopausal age. She is in agreement with this.    There is a child expected in her family near the end of October so she would prefer a November date for her surgery.    Will have her return to see me for consent documentation and also her anesthesia pre-op visit nearer the surgery date.

## 2005-06-08 ENCOUNTER — Ambulatory Visit (HOSPITAL_BASED_OUTPATIENT_CLINIC_OR_DEPARTMENT_OTHER): Payer: Charity | Admitting: Family Medicine

## 2005-06-08 VITALS — BP 120/80 | HR 76 | Ht 59.75 in | Wt 146.0 lb

## 2005-06-08 DIAGNOSIS — D259 Leiomyoma of uterus, unspecified: Principal | ICD-10-CM

## 2005-06-08 DIAGNOSIS — N926 Irregular menstruation, unspecified: Secondary | ICD-10-CM

## 2005-06-08 DIAGNOSIS — E7800 Pure hypercholesterolemia, unspecified: Secondary | ICD-10-CM

## 2005-06-08 DIAGNOSIS — D649 Anemia, unspecified: Secondary | ICD-10-CM

## 2005-06-08 DIAGNOSIS — N939 Abnormal uterine and vaginal bleeding, unspecified: Secondary | ICD-10-CM

## 2005-06-08 DIAGNOSIS — E669 Obesity, unspecified: Secondary | ICD-10-CM

## 2005-06-08 LAB — BLOOD COUNT COMPLETE AUTOMATED
HEMATOCRIT: 35.7 % — ABNORMAL LOW (ref 37.0–47.0)
HEMOGLOBIN: 12.1 g/dL (ref 12.0–16.0)
MEAN CORP HGB CONC: 33.8 g/dL (ref 32.0–36.0)
MEAN CORPUSCULAR HGB: 30.7 pg (ref 27.0–31.0)
MEAN CORPUSCULAR VOL: 90.7 fL (ref 81.0–99.0)
MEAN PLATELET VOLUME: 9.2 fL (ref 6.4–10.8)
PLATELET COUNT: 294 10*3/uL (ref 150–400)
RBC DISTRIBUTION WIDTH: 14.8 % — ABNORMAL HIGH (ref 11.5–14.3)
RED BLOOD CELL COUNT: 3.93 MIL/uL — ABNORMAL LOW (ref 4.20–5.40)
WHITE BLOOD CELL COUNT: 6.2 10*3/uL (ref 4.8–10.8)

## 2005-06-09 NOTE — Progress Notes (Signed)
Patient Active Problem List:   UTERINE LEIOMYOMA NOS [218.9]   Date Noted: 02/16/2005   Comment: 06/08/2005 has seen Dr Mindi Junker, for    hysterectomy sometime in November; follow    up with him   OBESITY NOS [278.00]   Date Noted: 07/03/2004   Comment: 06/08/2005 seeing nutritionist; no change    wt yet   PURE HYPERCHOLESTEROLEM [272.0]   Date Noted: 07/03/2004   MENSTRUAL DISORDER NOS [626.9]   Date Noted: 07/03/2004   Comment: Due to fibroids; due for surgery   FAMILY HX GI MALIGNANCY [V16.0]   Date Noted: 07/03/2004   ANEMIA NOS [285.9]   Date Noted: 06/18/2004   Comment: 06/08/2005 due to fibroids; will recheck,    on iron   URINARY CALCULUS NOS [592.9]   Date Noted: 06/18/2004  Current outpatient prescriptions prior to 06/08/05:  LO/OVRAL (28) 0.3-30 MG-MCG OR TABS, one daily, Disp: 3, Rfl: 3  NAPROXEN 500 MG OR TABS, 1 TABLET TWICE DAILY WITH FOOD, AS NEEDED FOR PAIN, Disp: 60, Rfl: 2  FERROUS SULFATE 325 MG OR TABS, 1 TABLET THREE TIMES A DAY FOR ANEMIA, Disp: 100, Rfl: 1 YR    Here to follow up above.  Patient Active Problem List updated at today's visit.  Medications reviewed in detail with patient; reports compliance with meds and no side effects noted.  Has been holding off on surgery; now would like it done ASAP; will need follow up visit with Dr Mindi Junker.   Counselled regarding TLC to lose wt and lower cholesterol.  Otherwise Generally feels well; no chest pain, no shortness of breath, no DOE, no fever, no significant GI sx, no focal neuro sx, no significant/worrisome rash, no significant GU sx, no acute or significant musculoskeletal sx    OBJECTIVE:  BP 120/80   Pulse 76   Ht 4' 11.75" (1.39m)   Wt 146 lbs (66.2kg)  Body mass index is 28.74 kg/(m^2).  NAD, alert, well developed, well nourished  Mental status exam; alert, oriented. Normal thought content, speech, and dress are noted. Denies suicidal or homicidal ideation. Affect and mood appropriate    ASSESSMENT:  218.9 UTERINE LEIOMYOMA NOS (primary  encounter diagnosis)  Note: will email Dr Mindi Junker regarding timing of surgery; appt made with him at [facility] for 1/07  Plan: COMPLETE CBC, AUTOMATED, ROUTINE VENIPUNCTURE,    REFERRAL TO GYNECOLOGY (INT)       278.00 OBESITY NOS  Note: counselled regarding medical risks of obesity/overweight, including diabetes, high cholesterol, hypertension, and increased risk of heart disease, stroke; See Orders and Patient Instructions.   Plan:     272.0 PURE HYPERCHOLESTEROLEM  Note: counselled regarding need to lower cholesterol to avoid complications such as MI, CVA, and even death.   Plan: need to increase TLC    626.9 MENSTRUAL DISORDER NOS  Note: per gyn  Plan:     285.9 ANEMIA NOS  Note: will recheck   Plan: COMPLETE CBC, AUTOMATED

## 2005-07-06 ENCOUNTER — Encounter (HOSPITAL_BASED_OUTPATIENT_CLINIC_OR_DEPARTMENT_OTHER): Payer: Charity | Admitting: Obstetrics & Gynecology

## 2005-07-06 ENCOUNTER — Encounter (HOSPITAL_BASED_OUTPATIENT_CLINIC_OR_DEPARTMENT_OTHER): Payer: Self-pay

## 2005-07-06 DIAGNOSIS — Z01818 Encounter for other preprocedural examination: Principal | ICD-10-CM

## 2005-07-12 LAB — BLOOD COUNT COMPLETE AUTOMATED
HEMATOCRIT: 37.2 % (ref 37.0–47.0)
HEMOGLOBIN: 12.6 g/dL (ref 12.0–16.0)
MEAN CORP HGB CONC: 33.9 g/dL (ref 32.0–36.0)
MEAN CORPUSCULAR HGB: 30.9 pg (ref 27.0–31.0)
MEAN CORPUSCULAR VOL: 90.9 fL (ref 81.0–99.0)
MEAN PLATELET VOLUME: 8.6 fL (ref 6.4–10.8)
PLATELET COUNT: 336 10*3/uL (ref 150–400)
RBC DISTRIBUTION WIDTH: 14.7 % — ABNORMAL HIGH (ref 11.5–14.3)
RED BLOOD CELL COUNT: 4.09 MIL/uL — ABNORMAL LOW (ref 4.20–5.40)
WHITE BLOOD CELL COUNT: 8.9 10*3/uL (ref 4.8–10.8)

## 2005-07-12 LAB — TYPE AND SCREEN

## 2005-07-13 ENCOUNTER — Inpatient Hospital Stay (HOSPITAL_BASED_OUTPATIENT_CLINIC_OR_DEPARTMENT_OTHER): Payer: Self-pay | Admitting: Obstetrics & Gynecology

## 2005-07-13 DIAGNOSIS — Z01818 Encounter for other preprocedural examination: Principal | ICD-10-CM

## 2005-07-13 LAB — TREPONEMA PALLIDUM AB IGG: TREPONEMA PALLIDUM AB IgG: NONREACTIVE

## 2005-07-13 LAB — HCG QUALITATIVE SERUM: HCG QUALITATIVE SERUM: NEGATIVE

## 2005-07-14 LAB — BLOOD COUNT COMPLETE AUTOMATED
HEMATOCRIT: 27.7 % — ABNORMAL LOW (ref 37.0–47.0)
HEMOGLOBIN: 9.5 g/dL — ABNORMAL LOW (ref 12.0–16.0)
MEAN CORP HGB CONC: 34.2 g/dL (ref 32.0–36.0)
MEAN CORPUSCULAR HGB: 31.3 pg — ABNORMAL HIGH (ref 27.0–31.0)
MEAN CORPUSCULAR VOL: 91.7 fL (ref 81.0–99.0)
MEAN PLATELET VOLUME: 8.7 fL (ref 6.4–10.8)
PLATELET COUNT: 259 10*3/uL (ref 150–400)
RBC DISTRIBUTION WIDTH: 14.2 % (ref 11.5–14.3)
RED BLOOD CELL COUNT: 3.02 MIL/uL — ABNORMAL LOW (ref 4.20–5.40)
WHITE BLOOD CELL COUNT: 12.3 10*3/uL — ABNORMAL HIGH (ref 4.8–10.8)

## 2005-07-16 LAB — BLOOD COUNT COMPLETE AUTOMATED
HEMATOCRIT: 29.5 % — ABNORMAL LOW (ref 37.0–47.0)
HEMOGLOBIN: 9.9 g/dL — ABNORMAL LOW (ref 12.0–16.0)
MEAN CORP HGB CONC: 33.6 g/dL (ref 32.0–36.0)
MEAN CORPUSCULAR HGB: 31.1 pg — ABNORMAL HIGH (ref 27.0–31.0)
MEAN CORPUSCULAR VOL: 92.5 fL (ref 81.0–99.0)
MEAN PLATELET VOLUME: 9 fL (ref 6.4–10.8)
PLATELET COUNT: 283 10*3/uL (ref 150–400)
RBC DISTRIBUTION WIDTH: 14.4 % — ABNORMAL HIGH (ref 11.5–14.3)
RED BLOOD CELL COUNT: 3.19 MIL/uL — ABNORMAL LOW (ref 4.20–5.40)
WHITE BLOOD CELL COUNT: 10.8 10*3/uL (ref 4.8–10.8)

## 2005-07-16 LAB — SURGICAL PATH SPECIMEN

## 2005-07-19 ENCOUNTER — Encounter (HOSPITAL_BASED_OUTPATIENT_CLINIC_OR_DEPARTMENT_OTHER): Payer: Charity | Admitting: Obstetrics & Gynecology

## 2005-07-19 ENCOUNTER — Telehealth (HOSPITAL_BASED_OUTPATIENT_CLINIC_OR_DEPARTMENT_OTHER): Payer: Self-pay | Admitting: "Women's Health Care

## 2005-07-26 ENCOUNTER — Encounter (HOSPITAL_BASED_OUTPATIENT_CLINIC_OR_DEPARTMENT_OTHER): Payer: Charity | Admitting: Obstetrics & Gynecology

## 2005-07-26 DIAGNOSIS — O900 Disruption of cesarean delivery wound: Principal | ICD-10-CM

## 2005-07-28 ENCOUNTER — Ambulatory Visit (HOSPITAL_BASED_OUTPATIENT_CLINIC_OR_DEPARTMENT_OTHER): Payer: Charity | Admitting: Registered"

## 2005-07-28 VITALS — Ht 59.84 in | Wt 144.2 lb

## 2005-07-28 DIAGNOSIS — D649 Anemia, unspecified: Secondary | ICD-10-CM

## 2005-07-28 DIAGNOSIS — E7800 Pure hypercholesterolemia, unspecified: Principal | ICD-10-CM

## 2005-07-28 DIAGNOSIS — E669 Obesity, unspecified: Secondary | ICD-10-CM

## 2005-08-10 ENCOUNTER — Ambulatory Visit (HOSPITAL_BASED_OUTPATIENT_CLINIC_OR_DEPARTMENT_OTHER): Payer: Charity | Admitting: Family Medicine

## 2005-08-10 VITALS — BP 124/82 | HR 88 | Temp 98.1°F | Resp 18 | Ht 59.5 in | Wt 146.0 lb

## 2005-08-10 DIAGNOSIS — N939 Abnormal uterine and vaginal bleeding, unspecified: Secondary | ICD-10-CM

## 2005-08-10 DIAGNOSIS — E669 Obesity, unspecified: Secondary | ICD-10-CM

## 2005-08-10 DIAGNOSIS — K5900 Constipation, unspecified: Secondary | ICD-10-CM

## 2005-08-10 DIAGNOSIS — D649 Anemia, unspecified: Principal | ICD-10-CM

## 2005-08-10 DIAGNOSIS — D259 Leiomyoma of uterus, unspecified: Secondary | ICD-10-CM

## 2005-08-10 DIAGNOSIS — N926 Irregular menstruation, unspecified: Secondary | ICD-10-CM

## 2005-08-10 NOTE — Patient Instructions (Addendum)
Tome o ferro 1-2 vezes por dia.  Tome o docusate 2 vezes por dia.  Volte para um exame completo daqui a 1-2 meses, mais ou menos; trague todos os remedios [employer]

## 2005-08-25 LAB — ~~LOC~~-OPERATIVE REPORT

## 2005-08-28 DIAGNOSIS — K5900 Constipation, unspecified: Secondary | ICD-10-CM | POA: Insufficient documentation

## 2005-08-28 NOTE — Progress Notes (Signed)
Patient Active Problem List:   CONSTIPATION NOS [564.00]   Date Noted: 08/28/2005   Comment: 08/10/05 Diet info given in appropriate    language to increase fiber; also try    docusate    UTERINE LEIOMYOMA NOS [218.9]   Date Noted: 02/16/2005   Comment: Dr Mindi Junker hysterectomy November, 2006   OBESITY NOS [278.00]   Date Noted: 07/03/2004   Comment: 06/08/2005 seeing nutritionist; no change    wt yet   PURE HYPERCHOLESTEROLEM [272.0]   Date Noted: 07/03/2004   Comment: Diet info given in appropriate language in    past   MENSTRUAL DISORDER NOS [626.9]   Date Noted: 07/03/2004   Comment: Due to fibroids; follow up after surgery    FAMILY HX GI MALIGNANCY [V16.0]   Date Noted: 07/03/2004   ANEMIA NOS [285.9]   Date Noted: 06/18/2004   Comment: 06/08/2005 due to fibroids; will recheck,    on iron   URINARY CALCULUS NOS [592.9]   Date Noted: 06/18/2004  Current outpatient prescriptions prior to 08/10/05:  DISCONTD: LO/OVRAL (28) 0.3-30 MG-MCG OR TABS, one daily, Disp: 3, Rfl: 3  DISCONTD: NAPROXEN 500 MG OR TABS, 1 TABLET TWICE DAILY WITH FOOD, AS NEEDED FOR PAIN, Disp: 60, Rfl: 2  FERROUS SULFATE 325 MG OR TABS, 1 TABLET THREE TIMES A DAY FOR ANEMIA, Disp: 100, Rfl: 1 YR    Here to follow up above.  Patient Active Problem List updated at today's visit.  Medications reviewed in detail with patient; reports compliance with meds and no side effects noted.    Had hysterectomy last month, went well; now on Premarin; for follow up next month at [facility].  Due for mammo.  Still chronic problem constipation, increased since surgery; options discussed in detail; as per Patient Active Problem List     The patient denies abdominal or flank pain, anorexia, nausea or vomiting, dysphagia, other change in bowel habits or black or bloody stools.    OBJECTIVE:   BP 124/82  Pulse 88  Temp (Src) 98.1 (Oral)  Resp 18  Ht 4' 11.5" (1.52m)  Wt 146 lbs (66.2kg)  LMP 07/11/2005  Body mass index is 29.01 kg/(m^2).  NAD, alert, well developed,  well nourished \  The abdomen is soft without tenderness, guarding, mass, rebound or organomegaly. Bowel sounds are normal. No CVA tenderness noted.     ASSESSMENT:  285.9 ANEMIA NOS (primary encounter diagnosis)  Note: Follow up at next/future visit; will recheck after surgery  Plan:     564.00 CONSTIPATION NOS  Note: as per Patient Active Problem List; See Orders and Patient Instructions.   Plan:     218.9 UTERINE LEIOMYOMA NOS  Note: per gyn  Plan:     278.00 OBESITY NOS  Note: Counselled regarding TLC to lose wt and lower cholesterol   Plan:     626.9 MENSTRUAL DISORDER NOS  Note: Follow up at next/future visit   Plan:     HM: due for mammo

## 2005-08-31 ENCOUNTER — Ambulatory Visit (HOSPITAL_BASED_OUTPATIENT_CLINIC_OR_DEPARTMENT_OTHER): Payer: Charity | Admitting: Obstetrics & Gynecology

## 2005-08-31 ENCOUNTER — Encounter (HOSPITAL_BASED_OUTPATIENT_CLINIC_OR_DEPARTMENT_OTHER): Payer: Self-pay | Admitting: Obstetrics & Gynecology

## 2005-08-31 ENCOUNTER — Encounter (HOSPITAL_BASED_OUTPATIENT_CLINIC_OR_DEPARTMENT_OTHER): Payer: Self-pay

## 2005-08-31 DIAGNOSIS — Z09 Encounter for follow-up examination after completed treatment for conditions other than malignant neoplasm: Principal | ICD-10-CM

## 2005-08-31 NOTE — Progress Notes (Signed)
Pt had Supracervical Hysterectomy/BSO on 11/14. Path: Adenomyosis.     Seen in [facility] X 1 for wound check and found to have pinpoint area of superficial non-union. This is now resolved.     Pt has abdominal discomfort in the area of the incision associated with heavy lifting/straining.    Exam of the incision reveals no erythema, induration, or tenderness. Incision is well healed.    Pelvic deferred as there is no cuff to examine (cervix retained).    Pt inquires about Pap testing. Would need this q 3 yrs at her age & risk status, & would be due 2 years from now.    Pt given Premarin after discharge (0.625 mg qd) which she should continue for a year or two, then try taper/discontinuation to assess if she is symptomatic.

## 2005-09-27 ENCOUNTER — Encounter (HOSPITAL_BASED_OUTPATIENT_CLINIC_OR_DEPARTMENT_OTHER): Payer: Charity | Admitting: Registered"

## 2005-12-07 ENCOUNTER — Ambulatory Visit (HOSPITAL_BASED_OUTPATIENT_CLINIC_OR_DEPARTMENT_OTHER): Payer: Charity | Admitting: Family Medicine

## 2006-01-03 LAB — ~~LOC~~-DISCHARGE SUMMARY

## 2006-02-09 ENCOUNTER — Ambulatory Visit (HOSPITAL_BASED_OUTPATIENT_CLINIC_OR_DEPARTMENT_OTHER): Payer: Charity | Admitting: Family Medicine

## 2006-02-09 VITALS — BP 110/78 | HR 72 | Ht 59.5 in | Wt 135.0 lb

## 2006-02-09 DIAGNOSIS — IMO0002 Reserved for concepts with insufficient information to code with codable children: Secondary | ICD-10-CM

## 2006-02-09 DIAGNOSIS — D649 Anemia, unspecified: Principal | ICD-10-CM

## 2006-02-09 DIAGNOSIS — D259 Leiomyoma of uterus, unspecified: Secondary | ICD-10-CM

## 2006-02-09 DIAGNOSIS — E663 Overweight: Secondary | ICD-10-CM

## 2006-02-09 LAB — BLOOD COUNT COMPLETE AUTOMATED
HEMATOCRIT: 39.6 % (ref 37.0–47.0)
HEMOGLOBIN: 13.6 g/dL (ref 12.0–16.0)
MEAN CORP HGB CONC: 34.3 g/dL (ref 32.0–36.0)
MEAN CORPUSCULAR HGB: 32.1 pg — ABNORMAL HIGH (ref 27.0–31.0)
MEAN CORPUSCULAR VOL: 93.6 fL (ref 81.0–99.0)
MEAN PLATELET VOLUME: 9.3 fL (ref 6.4–10.8)
PLATELET COUNT: 258 10*3/uL (ref 150–400)
RBC DISTRIBUTION WIDTH: 13.9 % (ref 11.5–14.3)
RED BLOOD CELL COUNT: 4.23 MIL/uL (ref 4.20–5.40)
WHITE BLOOD CELL COUNT: 7.2 10*3/uL (ref 4.8–10.8)

## 2006-02-09 MED ORDER — PIROXICAM 20 MG PO CAPS
ORAL_CAPSULE | ORAL | Status: DC
Start: 2006-02-09 — End: 2006-12-02

## 2006-02-09 NOTE — Patient Instructions (Signed)
Mamografia dia 19 de Junho.  Para dor: piroxicam quando precisar, depois de comer; Pode usar o aparelho mais que poder; descanso (evite os movimentos ou as posicoes que face a Development worker, international aid).  Eu depois mando-lhe uma carta sobre os [employer]

## 2006-02-09 NOTE — Progress Notes (Signed)
Patient Active Problem List:   CONSTIPATION NOS [564.00]   Date Noted: 08/28/2005   Comment: 08/10/05 Diet info given in appropriate    language to increase fiber; also try    docusate    UTERINE LEIOMYOMA NOS [218.9]   Date Noted: 02/16/2005   Comment: Dr Mindi Junker hysterectomy November, 2006   OVERWEIGHT [278.02]   Date Noted: 07/03/2004   Comment: 06/08/2005 seeing nutritionist; no change    wt yet   PURE HYPERCHOLESTEROLEM [272.0]   Date Noted: 07/03/2004   Comment: Diet info given in appropriate language in    past   MENSTRUAL DISORDER NOS [626.9]   Date Noted: 07/03/2004   Comment: Due to fibroids;had hysterectomy 11/06   FAMILY HX GI MALIGNANCY [V16.0]   Date Noted: 07/03/2004   ANEMIA NOS [285.9]   Date Noted: 06/18/2004   Comment: 02/10/2006 due to fibroids most likely; had    hysterectomy 11/06; will recheck    URINARY CALCULUS NOS [592.9]   Date Noted: 06/18/2004  Current outpatient prescriptions prior to 02/09/06:  PREMARIN 0.625 MG OR TABS, 1 TABLET DAILY, per gyn, s/p hysterectomy, Disp: 30, Rfl: 0    Here to follow up above.  Patient Active Problem List updated at today's visit.  Medications reviewed in detail with patient; reports compliance with meds, unless noted otherwise below, and no side effects noted.    Had hysterectomy 11/06, saw Dr Mindi Junker 1/07; info from his note:  Pt had Supracervical Hysterectomy/BSO on 11/14. Path: Adenomyosis.   Pelvic deferred as there is no cuff to examine (cervix retained).  Pt inquires about Pap testing. Would need this q 3 yrs at her age & risk status, & would be due 2 years from now.  Pt given Premarin after discharge (0.625 mg qd) which she should continue for a year or two, then try taper/discontinuation to assess if she is symptomatic.  END OF DR NOTE    Had last pap 10/05 and so due for this 10/08.  Stopped Premarin due to feels it made her gain wt, feel bloated; has been having sx intermittently breasts engorgement, especially left side, which she feels is  generally "lumpy". No other breast sx.  Also pain right arm, shoulder, hand, wrist persists; intermittently but bothers her; realizes it's likely due to work; had done well with piroxicam in past and would like to take again.    Otherwise, except as noted above, Generally feels well; no chest pain, no shortness of breath, no DOE, no fever, no significant GI sx, no focal neuro sx, no significant/worrisome rash, no significant GU sx, no acute or significant musculoskeletal sx    OBJECTIVE:  BP 110/78  Pulse 72  Ht 4' 11.5" (1.72m)  Wt 135 lbs (61.2kg)  Body mass index is 26.82 kg/(m^2).  NAD, alert, well developed, well nourished  Breasts are symmetric. No dominant, discrete, fixed or suspicious masses are noted. Mild symmetric fibrocystic densities are noted in both upper outer quadrants. No skin or nipple changes or axillary nodes. Self exam is taught and encouraged. Mammogram - is due and ordered today.  A general joint exam of upper body is normal with full range of motion of shoulders, elbows, wrists, fingers; no active swelling, tenderness or synovitis at any joint. No soft tissue nodules.     ASSESSMENT:  285.9 ANEMIA NOS (primary encounter diagnosis)  Note: will recheck   Plan: COMPLETE CBC, AUTOMATED, ROUTINE VENIPUNCTURE       840.9 SPRAIN SHOULDER/ARM NOS  Note: See Orders and  Patient Instructions.   Plan: PIROXICAM 20 MG OR CAPS       278.02 OVERWEIGHT  Note: counselled regarding medical risks of obesity/overweight, including diabetes, high cholesterol, hypertension, and increased risk of heart disease, stroke  Plan: Follow up at next/future visit    218.9 UTERINE LEIOMYOMA NOS  Note: as per Patient Active Problem List   Plan: resolved; for next pap 10/08

## 2006-02-15 ENCOUNTER — Other Ambulatory Visit (HOSPITAL_BASED_OUTPATIENT_CLINIC_OR_DEPARTMENT_OTHER): Payer: Self-pay | Admitting: Family Medicine

## 2006-02-16 LAB — MA SCREENING MAMMO BILATERAL WITH CAD

## 2006-03-18 ENCOUNTER — Ambulatory Visit (HOSPITAL_BASED_OUTPATIENT_CLINIC_OR_DEPARTMENT_OTHER): Payer: Charity | Admitting: Family Medicine

## 2006-04-10 ENCOUNTER — Encounter (HOSPITAL_BASED_OUTPATIENT_CLINIC_OR_DEPARTMENT_OTHER): Payer: Self-pay | Admitting: Family Medicine

## 2006-05-11 ENCOUNTER — Emergency Department: Payer: Self-pay | Admitting: Emergency Medical Services

## 2006-05-11 LAB — XR RIBS LEFT WITH PA CHEST

## 2006-05-22 LAB — EMERGENCY ROOM NOTE

## 2006-11-29 ENCOUNTER — Ambulatory Visit (HOSPITAL_BASED_OUTPATIENT_CLINIC_OR_DEPARTMENT_OTHER): Payer: PRIVATE HEALTH INSURANCE | Admitting: Ophthalmology

## 2006-12-01 ENCOUNTER — Ambulatory Visit (HOSPITAL_BASED_OUTPATIENT_CLINIC_OR_DEPARTMENT_OTHER): Payer: Self-pay | Admitting: Family Medicine

## 2006-12-01 ENCOUNTER — Ambulatory Visit (HOSPITAL_BASED_OUTPATIENT_CLINIC_OR_DEPARTMENT_OTHER): Payer: PRIVATE HEALTH INSURANCE | Admitting: Family Medicine

## 2006-12-02 ENCOUNTER — Ambulatory Visit (HOSPITAL_BASED_OUTPATIENT_CLINIC_OR_DEPARTMENT_OTHER): Payer: Self-pay | Admitting: Family Medicine

## 2006-12-02 ENCOUNTER — Ambulatory Visit (HOSPITAL_BASED_OUTPATIENT_CLINIC_OR_DEPARTMENT_OTHER): Payer: PRIVATE HEALTH INSURANCE | Admitting: Family Medicine

## 2006-12-02 VITALS — BP 120/82 | Temp 97.9°F | Ht 59.5 in | Wt 150.0 lb

## 2006-12-02 DIAGNOSIS — IMO0002 Reserved for concepts with insufficient information to code with codable children: Secondary | ICD-10-CM

## 2006-12-02 DIAGNOSIS — E663 Overweight: Secondary | ICD-10-CM

## 2006-12-02 DIAGNOSIS — D259 Leiomyoma of uterus, unspecified: Secondary | ICD-10-CM

## 2006-12-02 DIAGNOSIS — D649 Anemia, unspecified: Secondary | ICD-10-CM

## 2006-12-02 DIAGNOSIS — IMO0001 Reserved for inherently not codable concepts without codable children: Principal | ICD-10-CM

## 2006-12-02 LAB — BLOOD COUNT COMPLETE AUTOMATED
HEMATOCRIT: 40.4 % (ref 36.0–48.0)
HEMOGLOBIN: 13.5 g/dl (ref 12.0–16.0)
MEAN CORP HGB CONC: 33.4 g/dl (ref 32.0–36.0)
MEAN CORPUSCULAR HGB: 30.9 pg (ref 27.0–33.0)
MEAN CORPUSCULAR VOL: 92.7 fl (ref 80.0–100.0)
MEAN PLATELET VOLUME: 9.6 fl (ref 6.4–10.8)
PLATELET COUNT: 284 10*3/uL (ref 150–400)
RBC DISTRIBUTION WIDTH: 13.9 % (ref 11.5–14.3)
RED BLOOD CELL COUNT: 4.35 M/uL — ABNORMAL LOW (ref 4.50–5.10)
WHITE BLOOD CELL COUNT: 7.6 10*3/uL (ref 4.0–10.8)

## 2006-12-02 LAB — CHG SEDIMENTATION RATE RBC NON-AUTOMATED: RBC SEDIMENTATION RATE: 15 MM/HR (ref 0–15)

## 2006-12-02 MED ORDER — PIROXICAM 20 MG PO CAPS
ORAL_CAPSULE | ORAL | Status: DC
Start: 2006-12-02 — End: 2007-05-31

## 2006-12-02 NOTE — Progress Notes (Signed)
1 mo hx left arm/humerus pain, increased past 2 weeks.  increased with abduction.  Also having leg cramps, hands fall asleep at night.  No trauma, no other focal neuro sx, no visual changes, no fever.     OBJECTIVE:  BP 120/82  Temp (Src) 97.9 F (36.6 C) (Oral)  Ht 4' 11.5" (1.511 m)  Wt 150 lb (68.04 kg)  Body mass index is 29.79 kg/(m^2).  NAD, alert, well developed, well nourished  Exam reveals positive Phalen and Tinel sign on left . There is no muscle atrophy noted.     ASSESSMENT:  729.1 Unspecified Myalgia and Myositis (primary encounter diagnosis)  Comment: See Orders and Patient Instructions.   Plan: VITAMIN D,25 HYDROXY, THYROID SCREEN TSH, RBC    SED RATE, NONAUTOMATED, COMPLETE CBC,    AUTOMATED, HOLD RED TOP TUBE, PIROXICAM 20 MG    OR CAPS, REFERRAL TO PHYSICAL THERAPY (INT)        e278.02 Overweight  Comment:   Plan: THYROID SCREEN TSH, ROUTINE VENIPUNCTURE       285.9 ANEMIA NOS  Comment:   Plan: COMPLETE CBC, AUTOMATED

## 2006-12-02 NOTE — Patient Instructions (Signed)
Para dor: ibuprofen (Motrin, Advil) ou piroxicam quando precisar, depois de comer, ou acetaminophen (Tylenol); Pode usar um compreso quente; descanso (evite os movimentos ou as posicoes que face a Development worker, international aid). Use o carpal tunnel splint o mais que poder.  Consulta com physical therapy.  Eu depois mando-lhe uma carta sobre os [employer].  Volte para um exame completo, em jejum (nada de comer ou beber depois da meia noite; mas pode tomar agua e os remedios)

## 2006-12-06 LAB — VITAMIN D,25 HYDROXY: VITAMIN D,25 HYDROXY: 24.7 ng/mL — AB (ref 32.0–100.0)

## 2006-12-07 LAB — THYROID SCREEN TSH REFLEX FT4: THYROID SCREEN TSH REFLEX FT4: 1.19 u[IU]/mL (ref 0.34–5.60)

## 2006-12-07 LAB — HOLD RED TOP TUBE

## 2007-01-02 ENCOUNTER — Ambulatory Visit (HOSPITAL_BASED_OUTPATIENT_CLINIC_OR_DEPARTMENT_OTHER): Payer: Self-pay

## 2007-01-09 ENCOUNTER — Other Ambulatory Visit (HOSPITAL_BASED_OUTPATIENT_CLINIC_OR_DEPARTMENT_OTHER): Payer: Self-pay | Admitting: Family Medicine

## 2007-01-09 ENCOUNTER — Other Ambulatory Visit (HOSPITAL_BASED_OUTPATIENT_CLINIC_OR_DEPARTMENT_OTHER): Payer: PRIVATE HEALTH INSURANCE | Admitting: Lab

## 2007-01-09 DIAGNOSIS — E7800 Pure hypercholesterolemia, unspecified: Principal | ICD-10-CM

## 2007-01-09 DIAGNOSIS — E663 Overweight: Secondary | ICD-10-CM

## 2007-01-09 DIAGNOSIS — Z139 Encounter for screening, unspecified: Principal | ICD-10-CM

## 2007-01-09 LAB — BLOOD COUNT COMPLETE AUTOMATED
HEMATOCRIT: 42.4 % (ref 36.0–48.0)
HEMOGLOBIN: 13.9 g/dl (ref 12.0–16.0)
MEAN CORP HGB CONC: 32.8 g/dl (ref 32.0–36.0)
MEAN CORPUSCULAR HGB: 31 pg (ref 27.0–33.0)
MEAN CORPUSCULAR VOL: 94.5 fl (ref 80.0–100.0)
MEAN PLATELET VOLUME: 9.6 fl (ref 6.4–10.8)
PLATELET COUNT: 286 10*3/uL (ref 150–400)
RBC DISTRIBUTION WIDTH: 13.5 % (ref 11.5–14.3)
RED BLOOD CELL COUNT: 4.48 M/uL — ABNORMAL LOW (ref 4.50–5.10)
WHITE BLOOD CELL COUNT: 7.4 10*3/uL (ref 4.0–10.8)

## 2007-01-09 NOTE — Progress Notes (Signed)
Labs drawn.  No orders placed in advance. Dr. Stann Mainland be notified.

## 2007-01-10 ENCOUNTER — Other Ambulatory Visit (HOSPITAL_BASED_OUTPATIENT_CLINIC_OR_DEPARTMENT_OTHER): Payer: Self-pay | Admitting: Family Medicine

## 2007-01-10 DIAGNOSIS — E7800 Pure hypercholesterolemia, unspecified: Principal | ICD-10-CM

## 2007-01-10 LAB — HOLD RED TOP TUBE

## 2007-01-17 ENCOUNTER — Ambulatory Visit (HOSPITAL_BASED_OUTPATIENT_CLINIC_OR_DEPARTMENT_OTHER): Payer: Self-pay | Admitting: Family Medicine

## 2007-01-18 ENCOUNTER — Encounter (HOSPITAL_BASED_OUTPATIENT_CLINIC_OR_DEPARTMENT_OTHER): Payer: Self-pay | Admitting: Family Medicine

## 2007-01-18 DIAGNOSIS — E559 Vitamin D deficiency, unspecified: Principal | ICD-10-CM

## 2007-01-18 MED ORDER — VITAMIN D 50000 UNIT OR CAPS
ORAL_CAPSULE | ORAL | Status: DC
Start: 2007-01-18 — End: 2007-05-31

## 2007-01-31 ENCOUNTER — Ambulatory Visit (HOSPITAL_BASED_OUTPATIENT_CLINIC_OR_DEPARTMENT_OTHER): Payer: PRIVATE HEALTH INSURANCE

## 2007-04-25 ENCOUNTER — Ambulatory Visit (HOSPITAL_BASED_OUTPATIENT_CLINIC_OR_DEPARTMENT_OTHER): Payer: PRIVATE HEALTH INSURANCE | Admitting: Family Medicine

## 2007-05-31 ENCOUNTER — Encounter (HOSPITAL_BASED_OUTPATIENT_CLINIC_OR_DEPARTMENT_OTHER): Payer: Self-pay

## 2007-05-31 ENCOUNTER — Ambulatory Visit (HOSPITAL_BASED_OUTPATIENT_CLINIC_OR_DEPARTMENT_OTHER): Payer: PRIVATE HEALTH INSURANCE

## 2007-05-31 VITALS — BP 120/80 | Temp 98.4°F | Wt 149.5 lb

## 2007-05-31 DIAGNOSIS — R52 Pain, unspecified: Secondary | ICD-10-CM

## 2007-05-31 DIAGNOSIS — Z0000 Encounter for general adult medical examination without abnormal findings: Principal | ICD-10-CM

## 2007-05-31 DIAGNOSIS — E559 Vitamin D deficiency, unspecified: Secondary | ICD-10-CM

## 2007-05-31 DIAGNOSIS — Z124 Encounter for screening for malignant neoplasm of cervix: Secondary | ICD-10-CM

## 2007-05-31 LAB — TSH (THYROID STIMULATING HORMONE): TSH (THYROID STIM HORMONE): 1.53 u[IU]/mL (ref 0.34–5.60)

## 2007-05-31 LAB — BLOOD COUNT COMPLETE AUTOMATED
HEMATOCRIT: 42.3 % (ref 36.0–48.0)
HEMOGLOBIN: 14.1 g/dl (ref 12.0–16.0)
MEAN CORP HGB CONC: 33.3 g/dl (ref 32.0–36.0)
MEAN CORPUSCULAR HGB: 31.5 pg (ref 27.0–33.0)
MEAN CORPUSCULAR VOL: 94.5 fl (ref 80.0–100.0)
MEAN PLATELET VOLUME: 10.1 fl (ref 6.4–10.8)
PLATELET COUNT: 274 10*3/uL (ref 150–400)
RBC DISTRIBUTION WIDTH: 13 % (ref 11.5–14.3)
RED BLOOD CELL COUNT: 4.48 M/uL — ABNORMAL LOW (ref 4.50–5.10)
WHITE BLOOD CELL COUNT: 8.8 10*3/uL (ref 4.0–10.8)

## 2007-05-31 LAB — CYANOCOBALAMIN VITAMIN B-12: VITAMIN B12: 303 pg/mL (ref 180–914)

## 2007-05-31 MED ORDER — VITAMIN D 50000 UNIT OR CAPS
ORAL_CAPSULE | ORAL | Status: AC
Start: 2007-05-31 — End: 2007-07-31

## 2007-05-31 NOTE — Patient Instructions (Signed)
Chame [phone] para marquer uma consulta com [employer] em 4 semanas

## 2007-05-31 NOTE — Progress Notes (Signed)
Stephanie [name] is a 46 year old female who presents with constant "bone aches" in arms, legs, hands. Pain is worse at night. Occasional vomiting which pt attributes to pain.  Denies pain in back, neck, abdomen.  Denies fever, diarrhea, constipation.    Also here for CPE.    ROS: denies const/ent/cv/pulm/GI/GU/derm complaints    No past medical history on file.    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    Review of Patient's Allergies indicates:  No Known Allergies.    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 Ears: TMs and canals wnl bilat. Nares: mucosa normal. OP: no erythema/exudates  Neck: supple, no thyromegaly, no LAD  CV: RRR, no m/g/r  Pulm: CTAB  Breasts: no masses/lesions/discharge. Discussed SBE.  Abd: NABS, soft, NT/ND, low transverse scars noted  GU: normal appearing  external genitalia, vagina, cervix. Pap/HPV done  Ext: no edema. Bilateral arms and legs diffusely mildly TTP.   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]   Date Noted: 02/16/2005   Dr Mindi Junker hysterectomy November, 2006; cervix    retained, last pap 10/05   Repeat pap done today 10/08       > PURE HYPERCHOLESTEROLEM [272.0]   Date Noted: 07/03/2004   Diet info given in appropriate language in past    Check fasting lipids at next appt    > ANEMIA NOS [285.9]   Date Noted: 06/18/2004   02/10/2006 due to fibroids most likely; had hysterectomy     Recheck today 10/08    > Vitamin D deficiency   Vit D 24.7 4/08; did not start supplemenation   10/08: recheck level and start weekly supplementation   Recheck level in 8 weeks (12/08)    > Diffuse myalgias, likely related to Vit D deficiency. Also check B12 and TSH levels.     BCM: hysterectomy  Health Maintenance:  > CPE: done 10/08, repeat in 1-3 yrs   > Breast: Mammo requisiton completed and given to [place] to schedule  > Pap/HPV/Pelvic: done 10/08, repeat in 3 years if pap/HPV negative  > Check fasting glucose at next visit  > Immunizations:     > Td 2005

## 2007-06-01 DIAGNOSIS — E559 Vitamin D deficiency, unspecified: Secondary | ICD-10-CM | POA: Insufficient documentation

## 2007-06-01 DIAGNOSIS — Z0000 Encounter for general adult medical examination without abnormal findings: Secondary | ICD-10-CM | POA: Insufficient documentation

## 2007-06-02 LAB — HUMAN PAPILLOMAVIRUS (HPV): HUMAN PAPILLOMAVIRUS: NEGATIVE

## 2007-06-02 LAB — VITAMIN D,25 HYDROXY: VITAMIN D,25 HYDROXY: 33.3 ng/mL (ref 32.0–100.0)

## 2007-06-07 ENCOUNTER — Ambulatory Visit (HOSPITAL_BASED_OUTPATIENT_CLINIC_OR_DEPARTMENT_OTHER): Payer: Self-pay

## 2007-06-08 ENCOUNTER — Encounter (HOSPITAL_BASED_OUTPATIENT_CLINIC_OR_DEPARTMENT_OTHER): Payer: Self-pay

## 2007-06-08 LAB — CYTOPATH, C/V, THIN LAYER

## 2007-07-06 ENCOUNTER — Encounter (HOSPITAL_BASED_OUTPATIENT_CLINIC_OR_DEPARTMENT_OTHER): Payer: Self-pay

## 2007-07-06 ENCOUNTER — Ambulatory Visit (HOSPITAL_BASED_OUTPATIENT_CLINIC_OR_DEPARTMENT_OTHER): Payer: Self-pay

## 2007-07-06 LAB — MA SCREENING MAMMO BILATERAL WITH CAD

## 2007-08-14 ENCOUNTER — Ambulatory Visit (HOSPITAL_BASED_OUTPATIENT_CLINIC_OR_DEPARTMENT_OTHER): Payer: Self-pay | Admitting: Family Medicine

## 2007-08-14 VITALS — BP 120/60 | Temp 98.3°F | Ht 60.0 in | Wt 155.1 lb

## 2007-08-14 DIAGNOSIS — R0781 Pleurodynia: Principal | ICD-10-CM

## 2007-08-14 DIAGNOSIS — R0789 Other chest pain: Secondary | ICD-10-CM

## 2007-08-14 MED ORDER — VICODIN 5-500 MG PO TABS
ORAL_TABLET | ORAL | Status: AC
Start: 2007-08-14 — End: 2007-08-21

## 2007-08-14 NOTE — Progress Notes (Signed)
Cc: fell at work    Hpi: on Friday fell at work. Taking tylenol and 800mg  ibuprofen, but not helping. Fell into bathtub while housecleaning.    Hurts with deep breathing across chest.    Ros: no fever, no abdominal pain, no weak or dizzy no shortness of breath    O: vitals noted  Pleasant, well appearing, no apparent distress  Mucous membranes moist, anicteric  Regular rate and rhythm no murmurs rubs or gallops  Chest wall with point tenderness and eccymoses over right mid rib area, not fluctuant or crepitance  Clear to auscultation bilaterally  No clubbing cyanosis or edema  Large area ecchymosis across abdomen,Nl bs, soft, non-tender, non-distended, no hepatosplenomegaly    A/p:786.50T Rib Pain  (primary encounter diagnosis)  Comment:   Plan: VICODIN 5-500 MG OR TABS        Note given for a few days off work, is possible has rib fracture or just contusion expect area to be sore next few weeks, return to clinic if worsens or problems breahting    Glyn Ade, MD

## 2007-08-19 ENCOUNTER — Encounter (HOSPITAL_BASED_OUTPATIENT_CLINIC_OR_DEPARTMENT_OTHER): Payer: Self-pay

## 2007-08-19 ENCOUNTER — Emergency Department (HOSPITAL_BASED_OUTPATIENT_CLINIC_OR_DEPARTMENT_OTHER)
Admission: RE | Admit: 2007-08-19 | Disposition: A | Discharge status: Home / Self Care | Payer: Self-pay | Source: Emergency Department | Attending: Emergency Medicine | Admitting: Emergency Medicine

## 2007-08-19 LAB — EMERGENCY ROOM NOTE

## 2007-08-19 LAB — XR RIBS RIGHT WITH PA & LATERAL CHEST

## 2007-08-19 MED ORDER — DIAZEPAM 5 MG PO TABS
ORAL_TABLET | ORAL | Status: AC
Start: 2007-08-19 — End: 2007-09-19

## 2007-08-19 MED ORDER — OXYCODONE-ACETAMINOPHEN 5-325 MG PO TABS
ORAL_TABLET | ORAL | Status: AC
Start: 2007-08-19 — End: 2007-09-28

## 2007-08-19 NOTE — ED Notes (Signed)
RT   RIB  ABD AREA.PAIN   S/P  FALL ON Tuesday

## 2007-08-19 NOTE — Discharge Instructions (Signed)
You have a broken rib.  It takes about 4-8 weeks to heal.  Take Tylenol 1-2 every 4-6 hours and/or ibuprofen OTC 3-4 every 6-8 hours as needed.   Take prescription pain medicine as needed.   Follow up if not better in about 2 weeks.     Verlin Fester de Costela  (Rib Fracture)    O diagnstico mdico diagnosticou  de costela fraturada (costela quebrada), que pode ter ocorrido devido a um golpe que recebeu no peito, queda sobre um objeto duro ou tosse ou espirro violento. Pode haver uma ou muitas fraturas. As fraturas de costela se curam sozinhas de 3 a 8 semanas. O perodo de [employer]  associado a uma tosse contnua ou outro fator agravante.     INSTRUES PARA TRATAMENTO DOMICILIAR   Evite esforo excessivo. Tome cuidado ao praticar [phone] e evite bater a [facility]. As atividades que causam dor so aquelas que repuxam o local da(s) fratura(s) e  melhor evit-las quando possvel.   Siga uma dieta normal e bem equilibrada. Beba bastante fluido para evitar constipao.   Respire fundo vrias vezes ao dia, para manter os pulmes livres de infeco. Tente tossir vrias vezes ao dia, protegendo a regio da fratura com um travesseiro. Isto ajudar evitar a pneumonia.   No use cinto ou faixa nas costelas, pois restringem a respirao, o que pode causar pneumonia.   Tome medicamentos como determinado por seu mdico. Para aliviar dor ou febre, use acetaminofeno (Tylenol) ou ibuprofeno (Advil ou Motrin), conforme orientado.     Procure assistncia mdica se:   Se a temperatura oral ultrapassar 102 F (38.9 C), no controlvel por medicamento.  Se tossir continuamente, associado a saliva grossa ou sangue.     Procure assistncia mdica imediatamente se:   Se tiver dificuldade em respirar.  Se tiver nusea, vmito ou dor abdominal.   Se a dor pior e no pode ser controlada com medicamentos.    Document Released: 08/16/2005  Document Re-Released: 02/07/2006  [facility] Patient Information 2008  [place], [place].

## 2007-09-06 ENCOUNTER — Ambulatory Visit (HOSPITAL_BASED_OUTPATIENT_CLINIC_OR_DEPARTMENT_OTHER): Payer: Self-pay

## 2007-10-09 ENCOUNTER — Emergency Department (HOSPITAL_BASED_OUTPATIENT_CLINIC_OR_DEPARTMENT_OTHER): Admission: RE | Admit: 2007-10-09 | Payer: Self-pay

## 2007-10-11 ENCOUNTER — Ambulatory Visit (HOSPITAL_BASED_OUTPATIENT_CLINIC_OR_DEPARTMENT_OTHER): Payer: PRIVATE HEALTH INSURANCE

## 2007-10-11 VITALS — BP 120/80 | Temp 98.1°F | Wt 159.0 lb

## 2007-10-11 DIAGNOSIS — E559 Vitamin D deficiency, unspecified: Secondary | ICD-10-CM

## 2007-10-11 DIAGNOSIS — F329 Major depressive disorder, single episode, unspecified: Secondary | ICD-10-CM

## 2007-10-11 DIAGNOSIS — E7800 Pure hypercholesterolemia, unspecified: Secondary | ICD-10-CM

## 2007-10-11 DIAGNOSIS — S335XXA Sprain of ligaments of lumbar spine, initial encounter: Principal | ICD-10-CM

## 2007-10-11 DIAGNOSIS — E663 Overweight: Secondary | ICD-10-CM

## 2007-10-11 LAB — CHG LIPOPROTEIN DIRECT MEASUREMENT LDL CHOLESTEROL: LOW DENSITY LIPOPROTEIN DIRECT: 140 mg/dl — ABNORMAL HIGH (ref 0–100)

## 2007-10-11 LAB — CHOLESTEROL SERUM/WHOLE BLOOD TOTAL: Cholesterol: 232 mg/dl — ABNORMAL HIGH (ref 0–200)

## 2007-10-11 LAB — GLUCOSE RANDOM: Glucose Random: 101 mg/dl (ref 74–160)

## 2007-10-11 LAB — CHG LIPOPROTEIN DIR MEAS HIGH DENSITY CHOLESTEROL: HIGH DENSITY LIPOPROTEIN: 61 mg/dl (ref 35–85)

## 2007-10-11 MED ORDER — CITALOPRAM HYDROBROMIDE 10 MG PO TABS
ORAL_TABLET | ORAL | Status: DC
Start: 2007-10-11 — End: 2008-02-20

## 2007-10-11 MED ORDER — MOTRIN 800 MG PO TABS
ORAL_TABLET | ORAL | Status: DC
Start: 2007-10-11 — End: 2008-02-20

## 2007-10-11 NOTE — Patient Instructions (Signed)
Ligue para marquer uma consulta com [facility] [place] 2 semanas  [phone]          [facility] Cuida!     Coisas importantes para voc saber e fazer:    -  Voc e a sua equipe de assistncia mdica devem trabalhar em parceria para tratar a sua depresso.    -  Voc vai receber uma ligao de uma enfermeira mdica ou de uma assistente social em 1 ou 2 semanas para ver como voc est passando.    -  Assegure-se de marcar uma consulta com um mdico em 4 a 6 semanas E TAMBM uma outra em 10 a 12 semanas.    -   muito importante que voc comparea entre 4 a 6 semanas e entre 10 a 12 semanas para termos certeza de que voc est indo bem e que o seu medicamento est funcionando.    -  Se voc pensar em se ferir ligue para o seu mdico, para o nmero 911 ou v  [facility]. (voc pode ir tambm para o pronto-socorro mais prximo).    -  A melhor forma de tratar a depresso  quando se combina medicamentos com terapia.  Se voc quiser conversar com algum sobre isso, pea ao seu mdico para lhe ajudar.    -   importante que voc tome o seu medicamento como foi indicado.  Os medicamentos para a depresso s funcionam se forem tomados diariamente.  Para ajud-lo a no se esquecer de tomar os seus medicamentos, experimente usar um estojo de comprimidos ou pea a um amigo ou parente para lhe ajudar.    -  Tenha pacincia, estes medicamentos levam de 2  4 semanas para comearem a funcionar.     -  Algumas pessoas sentem efeitos colaterais leves, mas eles normalmente passam aps alguns dias.    -  Se voc tiver algum efeito colateral grave, avise a clnica o mais breve possvel.    -  Os medicamentos para depresso NO so viciantes e nem causam dependncia.    -  Por favor no pare de tomar a sua medicao sem primeiro falar com o seu mdico.    -  Converse com o seu mdico se tiver alguma pergunta e no deixe de Equities trader voc se sente.    A depresso  tratvel!

## 2007-10-11 NOTE — Progress Notes (Signed)
SUBJECTIVE:   Stephanie [name] is a 47 year old female who complains of low back pain for 2 backs, positional with bending or lifting, no radiation down the legs. Precipitating factors: none recalled by the patient, but pt works as a Advertising copywriter. Prior history of back problems: no prior back problems.  Has been taking ibuprofen with some relief.  Denies numbness, tingling, weakness in extremities; denies bowel/bladder incontinence.    > also feeling depressed, anxious lately.  No recent stressors.  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  Pt contracts for safety today.  Also has malaise, general body aches and pains.    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]     Vitamin D Deficiency [268.9G]     Health Maintenance [V70.9L]     Major Depressive Disorder [296.20BB]      Past Surgical History    CESAREAN DELIVERY     Comment: x3    TOTAL HYSTERECTOMY     Comment: for fibroids, still has cervix                                         Meds: medication list reviewed and updated today    Review of Patient's Allergies indicates:   Nkda (no known drug*        OBJECTIVE:  BP 120/80  Temp (Src) 98.1 F (36.7 C) (Oral)  Wt 159 lb (72.122 kg)  LMP Postmenopausal  Gen: Patient appears to be comfortable  CV: RRR, no m/g/r  Pulm: CTAB  Abd: NABS, soft, NT, ND  Back: low lumbar paraspinal tenderness, no spasm.  Normal ROM.  Neuro: normal gait.  Straight leg raise is negative. DTR's, motor strength and sensation normal.      ASSESSMENT/PLAN:     > lumbar strain: For acute pain, rest, intermittent application of heat (do not sleep on heating pad), analgesics are recommended. Discussed longer term treatment plan of prn NSAID's and discussed  a home back care exercise program with flexion exercise routine. Proper lifting with avoidance of heavy lifting discussed. Consider Physical Therapy and XRay studies if not improving. Call or return to clinic prn if these symptoms worsen or fail to improve as anticipated.  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      Major Depressive Disorder [296.20BB]         Priority: High [1]         Class: Acute         Date Noted: 10/11/2007         2/09: PHQ9 = 24.  No current SI/HI.  Pt contracts          for safety.  Start celexa 10mg , increase to 20 mg          after 1 week.  TSH wnl 10/08.  At next visit,          discuss referral to [facility].  F/u 2 weeks  with Dr. Margo Aye.  Vitamin D Deficiency [268.9G]         Date Noted: 06/01/2007         Vit D 24.7 4/08; did not start supplemenation         10/08: recheck level and start weekly          supplementation.  Recheck level in 8 weeks (12/08)         2/09: check Vit D level today      Health Maintenance [V70.9L]         Date Noted: 06/01/2007         > CPE: done 10/08, repeat in 1-3 yrs          > Mammo neg 11/08         > HPV/pap neg 10/08, repeat in 3 years if pap/HPV          negative         >  Random glucose ordered 2/09         > Immunizations:            > Td 2005      PURE HYPERCHOLESTEROLEM [272.0]         Date Noted: 07/03/2004         2/09: check lipids.  Signed pt up for [facility] program;          refer to Merleen Nicely at next appt    Corena Herter, MD

## 2007-10-13 LAB — VITAMIN D,25 HYDROXY: VITAMIN D,25 HYDROXY: 26.7 ng/mL — AB (ref 32.0–100.0)

## 2007-10-18 ENCOUNTER — Telehealth (HOSPITAL_BASED_OUTPATIENT_CLINIC_OR_DEPARTMENT_OTHER): Payer: Self-pay

## 2007-10-18 NOTE — Telephone Encounter (Signed)
I called and left a voicemail to have the pt call back to make an appt with Dr. Margo Aye for a depression follow up for next week.  I will also mail a letter to the pt.

## 2007-10-20 ENCOUNTER — Other Ambulatory Visit (HOSPITAL_BASED_OUTPATIENT_CLINIC_OR_DEPARTMENT_OTHER): Payer: Self-pay | Admitting: Family Medicine

## 2007-10-20 NOTE — Telephone Encounter (Signed)
T/c to pt. No answer.message left requesting a call back. Pt put on pend.appt list.

## 2007-10-21 ENCOUNTER — Telehealth (HOSPITAL_BASED_OUTPATIENT_CLINIC_OR_DEPARTMENT_OTHER): Payer: Self-pay

## 2007-10-21 ENCOUNTER — Encounter (HOSPITAL_BASED_OUTPATIENT_CLINIC_OR_DEPARTMENT_OTHER): Payer: Self-pay

## 2007-10-21 NOTE — Telephone Encounter (Signed)
Component   Reference Range  10/11/2007   Cholesterol 0-200 mg/dl 295 (H)   HDL 62-13 mg/dl       61   LDL 0-865 mg/dl 784 (H)   Glucose Random 74-160 mg/dl      696   VITAMIN E,95-MWUXLKG 32.0-100.0 ng/mL 26.7 (A)     Pt has low vitamin D level.  Pt was prescribed high-dose vit D in 10/08; need to call pt to determine if she has been taking Vit D.  Also need to discuss hyperlipidemia; pt should make appt with Konrad Felix.    Thanks, Corena Herter, MD

## 2007-10-24 ENCOUNTER — Telehealth (HOSPITAL_BASED_OUTPATIENT_CLINIC_OR_DEPARTMENT_OTHER): Payer: Self-pay

## 2007-10-24 ENCOUNTER — Encounter (HOSPITAL_BASED_OUTPATIENT_CLINIC_OR_DEPARTMENT_OTHER): Payer: Self-pay

## 2007-10-24 NOTE — Telephone Encounter (Signed)
I called and left a voicemail for the pt to call me back when she has a chance to make an appt with Dr. Margo Aye for a depression follow up.  I will also mail an outreach letter to the pt.

## 2007-10-30 ENCOUNTER — Ambulatory Visit (HOSPITAL_BASED_OUTPATIENT_CLINIC_OR_DEPARTMENT_OTHER): Payer: PRIVATE HEALTH INSURANCE

## 2007-10-30 NOTE — Telephone Encounter (Signed)
T/c to pt via interpeter. No answer. Message left requesting a call back to review recent lab results.

## 2007-11-01 ENCOUNTER — Ambulatory Visit (HOSPITAL_BASED_OUTPATIENT_CLINIC_OR_DEPARTMENT_OTHER): Payer: PRIVATE HEALTH INSURANCE

## 2007-11-08 ENCOUNTER — Telehealth (HOSPITAL_BASED_OUTPATIENT_CLINIC_OR_DEPARTMENT_OTHER): Payer: Self-pay | Admitting: Family Medicine

## 2007-11-08 NOTE — Telephone Encounter (Signed)
Waymond Cera, RN Mon Oct 30, 2007 2:30 PM Signed   T/c to pt via interpeter. No answer. Message left requesting a call back to review recent lab results.  Corena Herter, MD Sat Oct 21, 2007 8:55 PM Signed   Component  Reference Range  10/11/2007    Cholesterol  0-200 mg/dl  130 (H)    HDL  86-57 mg/dl  61    LDL  8-469 mg/dl  629 (H)    Glucose Random  74-160 mg/dl  528    VITAMIN U,13-KGMWNUU  32.0-100.0 ng/mL  26.7 (A)    Pt has low vitamin D level. Pt was prescribed high-dose vit D in 10/08; need to call pt to determine if she has been taking Vit D. Also need to discuss hyperlipidemia; pt should make appt with Konrad Felix.   Thanks, Corena Herter, MD

## 2007-11-08 NOTE — Telephone Encounter (Addendum)
T/c to pt. No answer. Message left requesting a call back to discuss results and to r/s missed appt with Dr. Margo Aye. PT added to pending appt list

## 2007-11-28 ENCOUNTER — Encounter (HOSPITAL_BASED_OUTPATIENT_CLINIC_OR_DEPARTMENT_OTHER): Payer: Self-pay

## 2007-11-28 ENCOUNTER — Ambulatory Visit (HOSPITAL_BASED_OUTPATIENT_CLINIC_OR_DEPARTMENT_OTHER): Payer: PRIVATE HEALTH INSURANCE

## 2007-11-28 ENCOUNTER — Encounter (HOSPITAL_BASED_OUTPATIENT_CLINIC_OR_DEPARTMENT_OTHER): Payer: Self-pay | Admitting: Family Planning

## 2007-11-28 VITALS — BP 120/80 | Temp 98.4°F | Ht 59.75 in | Wt 156.3 lb

## 2007-11-28 DIAGNOSIS — F329 Major depressive disorder, single episode, unspecified: Principal | ICD-10-CM

## 2007-11-28 DIAGNOSIS — E663 Overweight: Secondary | ICD-10-CM

## 2007-11-28 DIAGNOSIS — E7800 Pure hypercholesterolemia, unspecified: Secondary | ICD-10-CM

## 2007-11-28 DIAGNOSIS — E559 Vitamin D deficiency, unspecified: Secondary | ICD-10-CM

## 2007-11-28 NOTE — Patient Instructions (Signed)
Please make appt with [facility] para marquer uma consulta com [facility] [place] em 4 semanas  [phone]

## 2007-11-28 NOTE — Progress Notes (Signed)
SUBJECTIVE:   Stephanie [name] is a 47 year old female here to f/u lab results and depression:  > taking celexa 10 mg qd, feeling better, still with trouble sleeping.  No recent stressors.  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  Pt contracts for safety today.  Also has malaise, general body aches and pains.    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]     Vitamin D Deficiency [268.9G]     Health Maintenance [V70.9L]     Major Depressive Disorder [296.20BB]      Past Surgical History    CESAREAN DELIVERY     Comment: x3    TOTAL HYSTERECTOMY     Comment: for fibroids, still has cervix                                         Meds: medication list reviewed and updated today    Review of Patient's Allergies indicates:   Nkda (no known drug*        OBJECTIVE:  BP 120/80   Temp (Src) 98.4 F (36.9 C) (Temporal)   Ht 4' 11.75" (1.518 m)   Wt 156 lb 5 oz (70.903 kg)   LMP Postmenopausal  Gen: Patient appears to be comfortable    ASSESSMENT/PLAN:     Greater than 50 % of this 25 min appt was spent counseling the patient regarding depression, hyperlipidemia      Major Depressive Disorder [296.20BB]         Priority: High [1]         Class: Acute         Date Noted: 10/11/2007         3/09: PHq9 = 12, encouraged to increase celexa to 20          mg daily.  No SI/HI.  Schedule appt with [facility]      Vitamin D Deficiency [268.9G]         Date Noted: 06/01/2007         Vit D 24.7 4/08; did not start supplemenation         10/08: recheck level and start weekly          supplementation.  Recheck level in 8 weeks (12/08)         3/09: Vit D level 26 in 2/09; pt no longer taking          Vit D; declines further  supplementation      Health Maintenance [V70.9L]         Date Noted: 06/01/2007         > CPE: done 10/08, repeat in 1-3 yrs          > Mammo neg 11/08         > HPV/pap neg 10/08, repeat in 3 years          >  Random glucose 101 2/09         >  Immunizations:            > Td 2005      OVERWEIGHT [278.02]         Date Noted: 07/03/2004         3/09: Has appt scheduled with nutritonist in [place]      PURE HYPERCHOLESTEROLEM [272.0]         Date Noted: 07/03/2004         2/09: TC 232, HDL 61, LDL 140         3/09: Declines referral to Konrad Felix.  Has appt          scheduled with nutritonist in [place]    Corena Herter, MD

## 2007-12-04 ENCOUNTER — Other Ambulatory Visit (HOSPITAL_BASED_OUTPATIENT_CLINIC_OR_DEPARTMENT_OTHER): Payer: Self-pay

## 2007-12-06 ENCOUNTER — Telehealth (HOSPITAL_BASED_OUTPATIENT_CLINIC_OR_DEPARTMENT_OTHER): Payer: Self-pay

## 2007-12-06 ENCOUNTER — Ambulatory Visit (HOSPITAL_BASED_OUTPATIENT_CLINIC_OR_DEPARTMENT_OTHER): Payer: PRIVATE HEALTH INSURANCE

## 2007-12-06 DIAGNOSIS — I8390 Asymptomatic varicose veins of unspecified lower extremity: Secondary | ICD-10-CM

## 2007-12-06 DIAGNOSIS — R32 Unspecified urinary incontinence: Secondary | ICD-10-CM

## 2007-12-06 LAB — URINE DIP (POINT OF CARE)
BILIRUBIN, URINE: NEGATIVE (ref 0–0)
GLUCOSE, URINE: NEGATIVE mg/dl (ref 0–0)
KETONE, URINE: NEGATIVE mg/dl (ref 0–0)
NITRITE, URINE: NEGATIVE
PH URINE: 7 (ref 5.0–8.0)
PROTEIN, URINE: 30 mg/dl — AB (ref 0–15)
SPECIFIC GRAVITY URINE: 1.02 (ref 1.003–1.030)
UROBILINOGEN URINE: 0.2 mg/dl (ref 0.2–1.0)

## 2007-12-06 MED ORDER — BACTRIM DS 800-160 MG PO TABS
ORAL_TABLET | ORAL | Status: AC
Start: 2007-12-06 — End: 2007-12-09

## 2007-12-06 NOTE — Progress Notes (Signed)
Stephanie [name] is a 47 year old female who presents for an acute visit with increased urinary frequency and dysuria x 2 days, abdominal pressure.  Denies fever, back pain, N/V, hematuria, abnormal vaginal discharge, abdominal pain.    Had intercourse 5 days ago, condom came off in vagina.   Sexually active with men, uses condoms for [facility].  No LMP date recorded. Reason: Postmenopausal.    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]     Vitamin D Deficiency [268.9G]     Health Maintenance [V70.9L]     Major Depressive Disorder [296.20BB]     [facility]-CARE COORDINATION PROGRAM PARTICIPANT [[id number]]    Meds: medication list reviewed and updated today    Review of Patient's Allergies indicates:   Nkda (no known drug*        OBJECTIVE:  BP 122/80  Temp (Src) 97.8 F (36.6 C) (Temporal)  Ht 4' 11.75" (1.518 m)  Wt 158 lb (71.668 kg)  LMP Postmenopausal  Gen: NAD  CV: RRR, no m  Pulm: CTAB  Abd: NABS, soft, NT, ND  Back: no CVAT  UA: + blood, +LE, + protein    A/P:   Probable UTI  > tx with bactrim bid x 7 days and follow urine C & S results  > also check urine GC/CT given recent condom mishap  > f/u if symptoms worsen or do not resolve, or if pt develops fever, back pain, or other new symptoms    > Varicose veins, pt interested in sclerotherapy, have emailed Dr Jackelyn Knife to discuss scheduling    > Urge and stress incontinence: refer back to Dr Mindi Junker.      Corena Herter, MD

## 2007-12-07 LAB — CHLAMYDIA GC NAAT
GENPROBE CHLAMYDIA: NEGATIVE
GENPROBE GC: NEGATIVE

## 2007-12-07 NOTE — Telephone Encounter (Signed)
I called and tried to schedule an appt for the pt with Dr. Mindi Junker for a Friday but Dr. Mindi Junker only works on Tuesday and Wednesdays.  I called the pt and left a voicemail to see what day she prefer's before scheduling the specialist appointment.

## 2007-12-08 LAB — URINE CULTURE/COLONY COUNT

## 2007-12-14 ENCOUNTER — Telehealth (HOSPITAL_BASED_OUTPATIENT_CLINIC_OR_DEPARTMENT_OTHER): Payer: Self-pay

## 2007-12-14 NOTE — Telephone Encounter (Signed)
I called and left a voicemail on the pt's phone to let the pt know about the appt that was scheduled for   Dr. Mindi Junker for Tuesday 01/16/08 @ 1:45pm at the [facility].  I will also mail a reminder letter to the pt.

## 2007-12-15 NOTE — Telephone Encounter (Signed)
Appt scheduled with Dr Mindi Junker 5/09.  Corena Herter, MD

## 2008-01-01 ENCOUNTER — Encounter (HOSPITAL_BASED_OUTPATIENT_CLINIC_OR_DEPARTMENT_OTHER): Payer: Self-pay

## 2008-01-01 NOTE — Progress Notes (Deleted)
[facility]- Case Management      Primary Reason for CM: {[facility]-CM REASONS:12615}    I. Cardiovascular Risk Screening  {CARDIOVASCULAR RISK SCREENING:12735}      II.  Preventative Cancer Screening    Breast Cancer Screening  {BREAST CANCER SCREENING:12736}    Cervical Cancer Screening  {CERVICAL CANCER SCREENING:12737}    Colon Cancer Screening  {COLON CANCER SCREENING:12739}      PLAN: ***      Referrals  {[facility] CM REFERRALS:12740}

## 2008-01-01 NOTE — Progress Notes (Signed)
[facility]-CHART REVIEW    Breast Cancer   Clinical Breast Exam result: Does not apply       Mammography BI-RADS result: Normal or Probably Normal Mammogram (BIRADS 1,2,3)      Cervical Cancer Screening   PAP Test Result: Normal PAP Test Result     Cardiovascular Risk Screening   High Risk Finding: BMI > 30                Elevated Risk:  Total Cholesterol > 200      Prostate Cancer Screening   Digital Rectal Exam (DRE): Does not apply                  Prostate Specific Antigen (PSA): No PSA documented  not applicable    Colon Cancer Screening   Colonoscopy results: No Colonoscopy documented      BP 122/80  BMI 31.1  ATP III score 1%    Referrals    Referral to Care Manager (RN or Social Worker) Waymond Cera RN  Referral to Risk Reduction/Health Educator      RN Case Reviewer:  Governor Rooks RN    Referred to Nurse Manager :  Governor Rooks [facility])

## 2008-01-02 ENCOUNTER — Telehealth (HOSPITAL_BASED_OUTPATIENT_CLINIC_OR_DEPARTMENT_OTHER): Payer: Self-pay | Admitting: Family Medicine

## 2008-01-09 ENCOUNTER — Ambulatory Visit (HOSPITAL_BASED_OUTPATIENT_CLINIC_OR_DEPARTMENT_OTHER): Payer: PRIVATE HEALTH INSURANCE | Admitting: Obstetrics & Gynecology

## 2008-01-09 VITALS — BP 110/64 | Wt 164.5 lb

## 2008-01-09 DIAGNOSIS — N3289 Other specified disorders of bladder: Principal | ICD-10-CM

## 2008-01-09 MED ORDER — DETROL LA 2 MG PO CP24
ORAL_CAPSULE | ORAL | Status: AC
Start: 2008-01-09 — End: 2008-04-10

## 2008-01-09 NOTE — Progress Notes (Signed)
47 y/o female presents for evaluation of irritable bladder sx.  Recently tx for UTI one month ago with some improvement.  Voids approximately every hour, but despite this has involuntary urine loss without sensation of voiding.  Has no stress incontinence.  Nocturia X 2.   Feels pressure in bladder area, and also some cramping discomfort in back (most likely unrelated).    Exam:  Excellent supports; no cystocele or rectocele, no urine loss during valsalva, despite a current sensation of the need to void.    Imp:  Irritable bladder  Plan:  Trial of anticholinergic  Referral to urology for possible urodynamic evaluation & fine-tuning of medical regimen.

## 2008-01-26 ENCOUNTER — Encounter (HOSPITAL_BASED_OUTPATIENT_CLINIC_OR_DEPARTMENT_OTHER): Payer: Self-pay

## 2008-01-26 DIAGNOSIS — N3289 Other specified disorders of bladder: Secondary | ICD-10-CM | POA: Insufficient documentation

## 2008-01-26 HISTORY — DX: Other specified disorders of bladder: N32.89

## 2008-02-06 ENCOUNTER — Ambulatory Visit (HOSPITAL_BASED_OUTPATIENT_CLINIC_OR_DEPARTMENT_OTHER): Payer: PRIVATE HEALTH INSURANCE | Admitting: Ophthalmology

## 2008-02-20 ENCOUNTER — Ambulatory Visit (HOSPITAL_BASED_OUTPATIENT_CLINIC_OR_DEPARTMENT_OTHER): Payer: PRIVATE HEALTH INSURANCE

## 2008-02-20 VITALS — BP 120/70 | Temp 98.0°F | Ht 59.78 in | Wt 160.0 lb

## 2008-02-20 DIAGNOSIS — J069 Acute upper respiratory infection, unspecified: Principal | ICD-10-CM

## 2008-02-20 MED ORDER — MOTRIN 800 MG PO TABS
ORAL_TABLET | ORAL | Status: AC
Start: 2008-02-20 — End: 2008-03-05

## 2008-02-20 NOTE — Progress Notes (Signed)
Onedia Vargus is a 47 year old female is a non-smoker here for acute visit with 5 days of fever, productive cough (yellow mucous), sore throat, myalgias.  Denies rhinorrhea or ear pain.  Sx improved today.    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  Ears: TMs and canals clear bilaterally.  Nares: clear congestion.  OP: erythematous, no exudates.  MMM.  Neck: supple, no lymphadenopathy  CV: RRR, no m/g/r  Pulm: CTAB   Skin: good skin turgor, normal cap refill    ASSESSMENT/PLAN:       > viral upper respiratory illness  Symptomatic therapy suggested: push fluids, rest, gargle warm salt water, neti-pot, use vaporizer or mist prn, use acetaminophen, ibuprofen, antihistamine-decongestant of choice, cough suppressant of choice, apply heat to sinuses prn.      Major Depressive Disorder [296.20BB]         Priority: High [1]         Class: Acute         Date Noted: 10/11/2007         3/09: PHq9 = 12, encouraged to increase celexa to 20          mg daily.  No SI/HI.  Schedule appt with Liset Mcmonigle         6/09: PHQ9 = 15 today, pt stopped taking meds 2-3          wks ago.  Does not want to  restart.  Also declines          Counseling.  Discussed with pt at length today.      Call or return to clinic prn if these symptoms worsen or fail to improve as anticipated.

## 2008-02-21 ENCOUNTER — Telehealth (HOSPITAL_BASED_OUTPATIENT_CLINIC_OR_DEPARTMENT_OTHER): Payer: Self-pay | Admitting: Family Medicine

## 2008-02-21 NOTE — Telephone Encounter (Signed)
Pt. not interested in active participation at this time.

## 2008-02-23 ENCOUNTER — Ambulatory Visit (HOSPITAL_BASED_OUTPATIENT_CLINIC_OR_DEPARTMENT_OTHER): Payer: PRIVATE HEALTH INSURANCE | Admitting: Urology

## 2008-04-01 ENCOUNTER — Telehealth (HOSPITAL_BASED_OUTPATIENT_CLINIC_OR_DEPARTMENT_OTHER): Payer: Self-pay | Admitting: Family Medicine

## 2008-04-01 NOTE — Telephone Encounter (Signed)
This pt. Needs appt. With Verdon Cummins the RRE for an  Elevated BMI

## 2008-04-02 ENCOUNTER — Telehealth: Payer: Self-pay

## 2008-04-17 ENCOUNTER — Ambulatory Visit (HOSPITAL_BASED_OUTPATIENT_CLINIC_OR_DEPARTMENT_OTHER): Payer: PRIVATE HEALTH INSURANCE

## 2008-04-17 VITALS — BP 122/82 | Temp 98.1°F | Ht 59.75 in | Wt 163.0 lb

## 2008-04-17 DIAGNOSIS — E559 Vitamin D deficiency, unspecified: Secondary | ICD-10-CM

## 2008-04-17 DIAGNOSIS — R0789 Other chest pain: Secondary | ICD-10-CM

## 2008-04-17 DIAGNOSIS — M79604 Pain in right leg: Secondary | ICD-10-CM

## 2008-04-17 DIAGNOSIS — R0781 Pleurodynia: Principal | ICD-10-CM

## 2008-04-17 LAB — XR KNEE RIGHT 3 VIEWS

## 2008-04-17 LAB — XR TIBIA FIBULA RIGHT 2 VIEWS

## 2008-04-17 MED ORDER — IBUPROFEN 800 MG PO TABS
ORAL_TABLET | ORAL | Status: DC
Start: 2008-04-17 — End: 2009-01-20

## 2008-04-17 MED ORDER — VITAMIN D 1000 UNITS PO TABS
ORAL_TABLET | ORAL | Status: AC
Start: 2008-04-17 — End: 2008-07-18

## 2008-04-17 NOTE — Progress Notes (Signed)
Cc: fall    SUBJECTIVE:  47 year old female with right rib pain and right leg pain after falling down.  She was at a park and slipped and fell onto right side of body.  Previous had rib fractures and noted that as soon as she fell, had pain in that area.  Also complains of pain in the right lower extremity.   Starts near the lateral side of right knee and extend down the leg. No difficulty walking or bearing weight. Denies numbness/tingling of that area.    Ros:  Denies fevers, chills, nausea, vomitting, diarrhea, abdominal pain, chest pain, shortness of breath, rashes or lower extremity edema.    BP 122/82   Temp (Src) 98.1 F (36.7 C) (Oral)   Ht 4' 11.75" (1.518 m)   Wt 163 lb (73.936 kg)   LMP Postmenopausal  Gen: AAO x 3 in NAD  HEENT: NCAT, Perrl, Eomi, Ears: external canal normal. TMS within normal, no erythema or dullness. Nasal turbinates within normal. Neck supple, no posterior or cervical adenopathy.   Thyroid: normal size, no nodularity, nontender.  Heart: regular rate and rhythm. No murmurs ascultated  Lungs: Clear to ascultation bilateral. No wheezes, rales or rhonchi.   Chest no tenderness over rib cage.  Abdomen: soft, nontender, nondistended. Good bowel sounds  Right lower ext: tenderness over proximal head of right fibula  Skin: no rashes, ecchymosis.  Right rib and right fibula xrays needed    A/p  786.50T Rib Pain  (primary encounter diagnosis)  Comment:   Plan: IBUPROFEN 800 MG OR TABS            729.5BC Right Leg Pain  Comment:   Plan: IBUPROFEN 800 MG OR TABS, KNEE, RIGHT 3 VIEWS,         TIBIA/FIBULA, RIGHT 2 VIEWS            E888.9D Fall - Accidental  Comment:   Plan: IBUPROFEN 800 MG OR TABS            268.9G Vitamin D Deficiency  Comment:   Plan: VITAMIN D 1000 UNIT OR TABS

## 2008-04-22 ENCOUNTER — Emergency Department (HOSPITAL_BASED_OUTPATIENT_CLINIC_OR_DEPARTMENT_OTHER)
Admission: RE | Admit: 2008-04-22 | Disposition: A | Discharge status: Home / Self Care | Payer: Self-pay | Source: Emergency Department | Attending: Emergency Medical Services | Admitting: Emergency Medical Services

## 2008-04-22 ENCOUNTER — Encounter (HOSPITAL_BASED_OUTPATIENT_CLINIC_OR_DEPARTMENT_OTHER): Payer: Self-pay

## 2008-04-22 LAB — COMPREHENSIVE METABOLIC PANEL
ALANINE AMINOTRANSFERASE: 43 IU/L — ABNORMAL HIGH (ref 7–35)
ALBUMIN: 3.8 g/dl (ref 3.4–4.8)
ALKALINE PHOSPHATASE: 80 IU/L (ref 25–106)
ANION GAP: 8 mmol/L (ref 2–25)
ASPARTATE AMINOTRANSFERASE: 35 IU/L — ABNORMAL HIGH (ref 8–34)
BILIRUBIN TOTAL: 0.6 mg/dl (ref 0.2–1.1)
BUN (UREA NITROGEN): 11 mg/dl (ref 6–20)
CALCIUM: 9.1 mg/dl (ref 8.6–10.3)
CARBON DIOXIDE: 25 mmol/L (ref 22–32)
CHLORIDE: 102 mmol/L (ref 101–111)
CREATININE: 0.6 mg/dl (ref 0.4–1.2)
ESTIMATED GLOMERULAR FILT RATE: >60 mL/min (ref 60–116)
Glucose Random: 100 mg/dl (ref 74–160)
POTASSIUM: 3.8 mmol/L (ref 3.5–5.1)
SODIUM: 135 mmol/L (ref 135–144)
TOTAL PROTEIN: 6.8 g/dl (ref 5.9–7.5)

## 2008-04-22 LAB — AMYLASE: AMYLASE: 56 U/L (ref 15–133)

## 2008-04-22 LAB — BLOOD COUNT COMPLETE AUTO&AUTO DIFRNTL WBC
BASOPHIL %: 0.8 % (ref 0.0–2.0)
EOSINOPHIL %: 0.4 % (ref 0.0–7.0)
HEMATOCRIT: 40.3 % (ref 36.0–48.0)
HEMOGLOBIN: 13.7 g/dl (ref 12.0–16.0)
LYMPHOCYTE %: 15.5 % (ref 13.0–39.0)
MEAN CORP HGB CONC: 33.8 g/dl (ref 32.0–36.0)
MEAN CORPUSCULAR HGB: 30.8 pg (ref 27.0–33.0)
MEAN CORPUSCULAR VOL: 90.9 fl (ref 80.0–100.0)
MEAN PLATELET VOLUME: 9 fl (ref 6.4–10.8)
MONOCYTE %: 3.5 % (ref 1.0–12.0)
NEUTROPHIL %: 79.8 % — ABNORMAL HIGH (ref 46.0–79.0)
PLATELET COUNT: 238 10*3/uL (ref 150–400)
RBC DISTRIBUTION WIDTH: 12.9 % (ref 11.5–14.3)
RED BLOOD CELL COUNT: 4.44 M/uL — ABNORMAL LOW (ref 4.50–5.10)
WHITE BLOOD CELL COUNT: 9.2 10*3/uL (ref 4.0–10.8)

## 2008-04-22 LAB — PROTHROMBIN TIME
INR: 1 — ABNORMAL LOW (ref 2.0–3.5)
PROTHROMBIN TIME: 10.4 SECONDS (ref 9.5–11.5)

## 2008-04-22 LAB — CK-MB PROFILE
CK INDEX: 1.7 %
CKMB: 1.7 ng/mL (ref 0.0–4.0)
CREATINE KINASE TOTAL: 98 IU/L (ref 21–215)

## 2008-04-22 LAB — TROPONIN I: TROPONIN I: 0.02 ng/mL (ref 0.00–0.04)

## 2008-04-22 LAB — LIPASE: LIPASE: 15 U/L (ref 10–50)

## 2008-04-22 LAB — APTT: APTT: 25.9 SECONDS (ref 23.7–33.3)

## 2008-04-22 NOTE — ED Notes (Signed)
Pt has had dizziness, vomiting, and chills. Pt states her heart was racing last pm and again today she feels her heart is racing.

## 2008-04-23 LAB — EKG

## 2008-04-23 LAB — XR RIBS RIGHT WITH PA CHEST

## 2008-05-10 LAB — EMERGENCY ROOM NOTE

## 2008-06-11 ENCOUNTER — Telehealth (HOSPITAL_BASED_OUTPATIENT_CLINIC_OR_DEPARTMENT_OTHER): Payer: Self-pay

## 2008-06-11 NOTE — Telephone Encounter (Signed)
[facility] outreach phone call 2nd attempt for RRE group on 06/11/08 by Karie Georges.  Pt. scheduled for RRE Group on 10/14

## 2008-07-23 ENCOUNTER — Encounter (HOSPITAL_BASED_OUTPATIENT_CLINIC_OR_DEPARTMENT_OTHER): Payer: Self-pay | Admitting: Family Planning

## 2008-09-06 ENCOUNTER — Ambulatory Visit (HOSPITAL_BASED_OUTPATIENT_CLINIC_OR_DEPARTMENT_OTHER): Payer: PRIVATE HEALTH INSURANCE

## 2008-09-06 VITALS — BP 116/80 | HR 94 | Temp 98.5°F | Ht 59.75 in | Wt 145.0 lb

## 2008-09-06 DIAGNOSIS — R42 Dizziness and giddiness: Principal | ICD-10-CM

## 2008-09-06 LAB — BLOOD COUNT COMPLETE AUTO&AUTO DIFRNTL WBC
BASOPHIL %: 0.5 % (ref 0.0–2.0)
EOSINOPHIL %: 1.5 % (ref 0.0–7.0)
HEMATOCRIT: 38 % (ref 36.0–48.0)
HEMOGLOBIN: 13.2 g/dl (ref 12.0–16.0)
LYMPHOCYTE %: 37.6 % (ref 13.0–39.0)
MEAN CORP HGB CONC: 34.6 g/dl (ref 32.0–36.0)
MEAN CORPUSCULAR HGB: 32.2 pg (ref 27.0–33.0)
MEAN CORPUSCULAR VOL: 92.9 fl (ref 80.0–100.0)
MEAN PLATELET VOLUME: 9.6 fl (ref 6.4–10.8)
MONOCYTE %: 6.5 % (ref 1.0–12.0)
NEUTROPHIL %: 53.9 % (ref 46.0–79.0)
PLATELET COUNT: 229 10*3/uL (ref 150–400)
RBC DISTRIBUTION WIDTH: 13.8 % (ref 11.5–14.3)
RED BLOOD CELL COUNT: 4.1 M/uL — ABNORMAL LOW (ref 4.50–5.10)
WHITE BLOOD CELL COUNT: 8.2 10*3/uL (ref 4.0–10.8)

## 2008-09-06 LAB — BASIC METABOLIC PANEL
ANION GAP: 5 mmol/L (ref 2–25)
BUN (UREA NITROGEN): 13 mg/dl (ref 6–20)
CALCIUM: 9.2 mg/dl (ref 8.6–10.3)
CARBON DIOXIDE: 27 mmol/L (ref 22–32)
CHLORIDE: 105 mmol/L (ref 101–111)
CREATININE: 0.6 mg/dl (ref 0.4–1.2)
ESTIMATED GLOMERULAR FILT RATE: >60 mL/min (ref 60–116)
Glucose Random: 94 mg/dl (ref 74–160)
POTASSIUM: 4.1 mmol/L (ref 3.5–5.1)
SODIUM: 137 mmol/L (ref 135–144)

## 2008-09-06 LAB — TSH (THYROID STIMULATING HORMONE): TSH (THYROID STIM HORMONE): 0.79 u[IU]/mL (ref 0.34–5.60)

## 2008-09-06 NOTE — Progress Notes (Signed)
SUBJECTIVE:  Stephanie [name] is an 48 year old female who presents for evaluation of dizziness. Characteristics of the dizziness are as follows:    Quality: lightheadedness. Pt denies any vertigo  Quantity: 6/10 in intensity  Chronicity: Onset 2 weeks ago, unchanged since  Aggravating factors: standing  Alleviating factors: recumbency  Associated symptoms: headache  No syncope or LOC    No past medical history on file.      Past Surgical History    OB ANTEPARTUM CARE C DLVR&POSTPARTUM     Comment: x3    TAH +-RMVL TUBE +-RMVL OVARY     Comment: for fibroids, still has cervix           Current outpatient prescriptions prior to encounter:  IBUPROFEN 800 MG OR TABS 1 TABLET 3 TIMES DAILY AS NEEDED Disp: 90 Rfl: 3   VITAMIN D 1000 UNIT OR TABS 1 TABLET DAILY Disp: 30 Rfl: 3   DETROL LA 2 MG OR CP24 1 CAPSULE DAILY Disp: 30 Rfl: 1         Review of Patient's Allergies indicates:   Nkda (no known drug*           Tobacco Use: Never    Alcohol Use: Yes  10 oz/week     Comment: weekends        Review Of Systems  Respiratory: negative  Cardiovascular: negative  Gastrointestinal: negative  Genitourinary: negative  Integumentary: no hair or nail changes  Derm: no rash  Constitutional: no fevers  Eye: no visual changes  ENT: no pharyngitis  MSK: no arthralgias    OBJECTIVE:  BP 116/80  Pulse 94  Temp (Src) 98.5 F (36.9 C) (Temporal)  Ht 4' 11.75" (1.518 m)  Wt 145 lb (65.772 kg)  SpO2 99%  LMP Postmenopausal  General appearance: healthy, alert, well developed, well nourished  Hydration: well hydrated  Ears: R TM - amber colored, L TM - amber colored  Nose: normal  Neuro: CN III-XII tested and intact  Oropharynx: normal  Neck: supple and no adenopathy  Lungs: clear to auscultation and percussion  Heart: normal and regular rate and rhythm  Abdomen: flat and symmetric, normal bowel sounds.   Tenderness: none  Masses: none  Organomegaly: none    ASSESSMENT/PLAN:      780.4A Dizziness  (primary encounter diagnosis)  Comment:  EKG normal  Plan: EKG ** DONE BY NURSE OR MD **, ROUTINE         VENIPUNCTURE, THYROID STIM HORMONE, BASIC         METABOLIC PANEL, COMPLETE CBC W/AUTO DIFF WBC        Serologic work-up unrevealing. Based on symptoms pt may have viral labyrinthitis vs BPH. May refer to neuro if sx not resolved in 1-2 weeks for further eval

## 2008-09-09 ENCOUNTER — Encounter (HOSPITAL_BASED_OUTPATIENT_CLINIC_OR_DEPARTMENT_OTHER): Payer: Self-pay

## 2008-09-09 ENCOUNTER — Telehealth (HOSPITAL_BASED_OUTPATIENT_CLINIC_OR_DEPARTMENT_OTHER): Payer: Self-pay

## 2008-09-09 LAB — EKG

## 2008-09-09 NOTE — Telephone Encounter (Signed)
P/c to pt through interpreter. She is still having dizzy spells. Gave message below and pt will call next week for an apt as we were unable to make apt for her PCP after 5 PM within the next week.

## 2008-09-09 NOTE — Telephone Encounter (Signed)
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    Patient's PCP: Corena Herter, MD    Person calling on behalf of patient: Patient (self)    Calls today Returning phone call    Patient's Preferred Pharmacy:   [facility] ([facility]  Phone: [phone] Fax: [phone]

## 2008-09-09 NOTE — Telephone Encounter (Signed)
Paulina Fusi - 09/06/08 (show header)   From Fae Pippin    Sent Sep 09, 2008 9:36 AM    To [facility] - pls let pt know that bloodwork was normal and f/u dizziness thx jb

## 2008-09-09 NOTE — Telephone Encounter (Signed)
Left voicemail for pt to call us back.  See message below

## 2008-09-26 ENCOUNTER — Telehealth (HOSPITAL_BASED_OUTPATIENT_CLINIC_OR_DEPARTMENT_OTHER): Payer: Self-pay | Admitting: Family Planning

## 2008-09-26 ENCOUNTER — Encounter (HOSPITAL_BASED_OUTPATIENT_CLINIC_OR_DEPARTMENT_OTHER): Payer: Self-pay

## 2008-09-26 NOTE — Telephone Encounter (Signed)
Patient navigator outreach for [facility] patient with overdue mammo.  Spoke with the patient and scheduled her mammo for 2/10/010 at 540 at [facility].  Pt is aware of the appointment.

## 2008-10-02 NOTE — Progress Notes (Signed)
Oct 09, 2008 RRE Group Flyer mailed to patient.

## 2008-10-09 ENCOUNTER — Ambulatory Visit (HOSPITAL_BASED_OUTPATIENT_CLINIC_OR_DEPARTMENT_OTHER): Payer: Self-pay

## 2008-10-24 ENCOUNTER — Encounter (HOSPITAL_BASED_OUTPATIENT_CLINIC_OR_DEPARTMENT_OTHER): Payer: Self-pay

## 2008-10-25 LAB — MA SCREENING MAMMO BILATERAL WITH CAD

## 2008-10-27 ENCOUNTER — Encounter (HOSPITAL_BASED_OUTPATIENT_CLINIC_OR_DEPARTMENT_OTHER): Payer: Self-pay

## 2008-10-29 ENCOUNTER — Telehealth (HOSPITAL_BASED_OUTPATIENT_CLINIC_OR_DEPARTMENT_OTHER): Payer: Self-pay

## 2008-10-29 NOTE — Telephone Encounter (Signed)
T/c to [facility] at [facility], patient had her mammogram done October 25, 2008.

## 2008-11-05 ENCOUNTER — Ambulatory Visit (HOSPITAL_BASED_OUTPATIENT_CLINIC_OR_DEPARTMENT_OTHER): Payer: PRIVATE HEALTH INSURANCE

## 2008-11-05 ENCOUNTER — Encounter (HOSPITAL_BASED_OUTPATIENT_CLINIC_OR_DEPARTMENT_OTHER): Payer: Self-pay

## 2008-11-05 VITALS — BP 114/78 | HR 104 | Temp 98.8°F | Wt 147.0 lb

## 2008-11-05 DIAGNOSIS — R232 Flushing: Principal | ICD-10-CM

## 2008-11-05 DIAGNOSIS — F419 Anxiety disorder, unspecified: Secondary | ICD-10-CM

## 2008-11-05 DIAGNOSIS — F329 Major depressive disorder, single episode, unspecified: Secondary | ICD-10-CM

## 2008-11-05 MED ORDER — PAROXETINE HCL 10 MG PO TABS
ORAL_TABLET | ORAL | Status: AC
Start: 2008-11-05 — End: 2008-11-27

## 2008-11-05 NOTE — Progress Notes (Signed)
SUBJECTIVE:  Stephanie [name] is an 48 year old female who presents for evaluation of anxiety  pain. Characteristics of the anxiety are as follows:    Anxiety/depression - Pt with months of anxiety worse in past few weeks. Pt feels depressed, having trouble concentrating, having trouble sleeping. No suicidal ideation or homicidal ideation. Occasional palpitations.    Of note - pt taking yellow pill from someone at the gym. Not sure what's in it. Taking pill for weight loss. Pt agrees to bring in med next visit.    Hot flashes - pt s/p hyst previously taking premarin - asks for a refill. Pt doesn't recall discussion of risks and benefits of med.        No past medical history on file.      Past Surgical History    OB ANTEPARTUM CARE C DLVR&POSTPARTUM     Comment: x3    TAH +-RMVL TUBE +-RMVL OVARY     Comment: for fibroids, still has cervix           Current outpatient prescriptions prior to encounter:  PAROXETINE HCL 10 MG OR TABS 1 TABLET DAILY Disp: 30 Rfl: 0   OMEGA 3 1000 MG OR CAPS 1 taily Disp:  Rfl:    IBUPROFEN 800 MG OR TABS 1 TABLET 3 TIMES DAILY AS NEEDED Disp: 90 Rfl: 3   VITAMIN D 1000 UNIT OR TABS 1 TABLET DAILY Disp: 30 Rfl: 3   DETROL LA 2 MG OR CP24 1 CAPSULE DAILY Disp: 30 Rfl: 1         Review of Patient's Allergies indicates:   Nkda (no known drug*           Tobacco Use: Never    Alcohol Use: Yes  10 oz/week     Comment: weekends        Review Of Systems  Respiratory: negative  Cardiovascular: palpitations  Gastrointestinal: negative  Genitourinary: negative  Integumentary: no hair or nail changes  Derm: no rash  Constitutional: no fevers  Eye: no visual changes  ENT: no pharyngitis  MSK: no arthralgias    OBJECTIVE:  BP 114/78   Pulse 104   Temp (Src) 98.8 F (37.1 C) (Oral)   Wt 147 lb (66.679 kg)   SpO2 98%   LMP Postmenopausal. Pulse rechecked by MD = 96  General appearance: healthy, alert, well developed, well nourished  Hydration: well hydrated  Ears: R TM - normal, L TM - normal  Nose:  normal  Oropharynx: normal  Neck: supple and no adenopathy  Lungs: clear to auscultation and percussion  Heart: normal and regular rate and rhythm  Abdomen: flat and symmetric, normal bowel sounds.   Tenderness: none  Masses: none  Organomegaly: none      ASSESSMENT/PLAN:      627.2X Hot Flashes  (primary encounter diagnosis)  Comment: S/p hysterectomy, bilat oophorect by hx  Plan: discussed risks and benefits of hormone replacement therapy and pt declines at this point    296.20BB Major Depressive Disorder  Comment: pt with worsening depressive sx.  Plan: REFERRAL TO ADULT PSYCHIATRY (INTERNAL)        Referral to psych per pt request. Pt would like to start med as below    300.00E Anxiety  Comment: worsening  Plan: PAROXETINE HCL 10 MG OR TABS        Try paxil due to anxiety/depression and predominance of insomnia

## 2008-11-10 ENCOUNTER — Telehealth (HOSPITAL_BASED_OUTPATIENT_CLINIC_OR_DEPARTMENT_OTHER): Payer: Self-pay

## 2008-11-10 DIAGNOSIS — F329 Major depressive disorder, single episode, unspecified: Principal | ICD-10-CM

## 2008-11-10 NOTE — Telephone Encounter (Signed)
Hi Mary,  I noticed that this pt is scheduled for a visit with Dr Nechama Guard.  She would probably do better with a [place]-[facility]ental Health Team appt.  Could you please call pt to discuss and please cancel her appt with Dr Nechama Guard if she is ok with this.  I do not think that she needs to see Dr Nechama Guard at this time.  If pt agrees, please let me know and I will refer her to [facility] [facility].  Thanks, Corena Herter, MD

## 2008-11-12 NOTE — Telephone Encounter (Signed)
T/c to pt No answer. Message left via interpreter requesting pt call me.

## 2008-11-13 NOTE — Telephone Encounter (Signed)
Staff Message copied by Gaynelle Arabian on Wed Nov 13, 2008 10:25 AM  ------   Message from: [facility], JUDITH   Created: Wed Nov 13, 2008 10:05 AM   Regarding: returning call   Contact: [phone]    Stephanie [name] [id number], 48 year old, female, Telephone Information:  Home Phone [phone]  Work Phone [phone]      Stephanie [name] NUMBER: [phone]  Best time to call back: anytime  Cell phone:   Other phone:    Available times:    Patient's language of care: [place]    Patient needs a [place] interpreter.    Patient's PCP: Corena Herter, MD    Person calling on behalf of patient: Patient (self)    Calls today Returning phonecall  Pt has agree to see a Portug speaking Mental health group, referral will be fine    Patient's Preferred Pharmacy:   [facility] ([facility])  Phone: [phone] Fax: [phone]

## 2008-11-13 NOTE — Telephone Encounter (Signed)
Referral completed for [facility].  Please let pt know that someone will be contacting her.  Please also let her know that her appt with Dr Nechama Guard will be canceled and that she should f/u with me in 2 weeks.  Thanks, Corena Herter, MD

## 2008-11-13 NOTE — Telephone Encounter (Addendum)
Spoke with pt. Via interpreter. Pt will call [facility] the week of  11/24/08 to sched f/u with PCP. She will be expecting the call from Mental Health . She is aware appt with Dr. Nechama Guard has been cancelled.

## 2008-11-20 ENCOUNTER — Other Ambulatory Visit (HOSPITAL_BASED_OUTPATIENT_CLINIC_OR_DEPARTMENT_OTHER): Payer: Self-pay

## 2008-11-26 ENCOUNTER — Telehealth (HOSPITAL_BASED_OUTPATIENT_CLINIC_OR_DEPARTMENT_OTHER): Payer: Self-pay

## 2008-11-26 NOTE — Telephone Encounter (Signed)
L/m on voicemail for pt to call the office to schedule appt with dr. hall

## 2008-11-26 NOTE — Telephone Encounter (Signed)
Please call pt to schedule an appt for depression with me.  Thanks, Corena Herter, MD

## 2008-12-23 ENCOUNTER — Ambulatory Visit (HOSPITAL_BASED_OUTPATIENT_CLINIC_OR_DEPARTMENT_OTHER): Payer: Self-pay

## 2008-12-31 ENCOUNTER — Ambulatory Visit (HOSPITAL_BASED_OUTPATIENT_CLINIC_OR_DEPARTMENT_OTHER): Payer: PRIVATE HEALTH INSURANCE

## 2009-01-09 ENCOUNTER — Encounter (HOSPITAL_BASED_OUTPATIENT_CLINIC_OR_DEPARTMENT_OTHER): Payer: Self-pay

## 2009-01-20 ENCOUNTER — Ambulatory Visit (HOSPITAL_BASED_OUTPATIENT_CLINIC_OR_DEPARTMENT_OTHER): Payer: Self-pay | Admitting: Family Medicine

## 2009-01-20 VITALS — BP 120/86 | Temp 98.3°F | Ht 59.75 in | Wt 147.0 lb

## 2009-01-20 DIAGNOSIS — E7800 Pure hypercholesterolemia, unspecified: Secondary | ICD-10-CM

## 2009-01-20 DIAGNOSIS — E559 Vitamin D deficiency, unspecified: Secondary | ICD-10-CM

## 2009-01-20 DIAGNOSIS — M25511 Pain in right shoulder: Principal | ICD-10-CM

## 2009-01-20 MED ORDER — IBUPROFEN 800 MG PO TABS
ORAL_TABLET | ORAL | Status: AC
Start: 2009-01-20 — End: 2009-04-22

## 2009-01-20 NOTE — Progress Notes (Signed)
HPI Comments: Pt here for R shoulder pain - was treated in the past for it and pills helped but pain comes back - assoc with work, when took a week off disappeared. Also hands get numb bilat after work, ibuprof also helped there but also recurs.    ROS: no n/v/d/f; no dizziness; no SOB or CP, no leg swelling or cramps.    O: NAD  HEENT: atraumatic, no icterus, OP clear  Skin: no lesions or rashes  CTA bilat  nl S1/S2, no murmur  Ext: no edema or clubbing, R shoulder pain post when raises arm, no point tenderness    A/P:719.41X Right Shoulder Pain  (primary encounter diagnosis)  Comment: supraspinatus tendonitis on exam  Plan: IBUPROFEN 800 MG OR TABS        F/u if wants formal PT    268.9G Vitamin D Deficiency  Comment: doing tanning, not atking meds  Plan: VITAMIN D,25 HYDROXY, ROUTINE VENIPUNCTURE        recheck    272.0E Hypercholesteremia  Comment: recheck, pt supposedly on diet now  Plan: LIPID PANEL        Not much wt loss.        Marland Kitchen  Marland Kitchen

## 2009-01-23 LAB — CHG LIPID PANEL
Cholesterol: 210 mg/dl — ABNORMAL HIGH (ref 0–200)
HIGH DENSITY LIPOPROTEIN: 60 mg/dl (ref 35–85)
LOW DENSITY LIPOPROTEIN DIRECT: 129 mg/dl — ABNORMAL HIGH (ref 0–100)
RISK FACTOR: 3.5 (ref ?–4.4)
TRIGLYCERIDES: 88 mg/dl (ref 0–150)

## 2009-01-23 LAB — VITAMIN D,25 HYDROXY: VITAMIN D,25 HYDROXY: 28.6 ng/ml — ABNORMAL LOW (ref 30.0–100.0)

## 2009-04-21 ENCOUNTER — Encounter (HOSPITAL_BASED_OUTPATIENT_CLINIC_OR_DEPARTMENT_OTHER): Payer: Self-pay

## 2009-08-05 ENCOUNTER — Ambulatory Visit (HOSPITAL_BASED_OUTPATIENT_CLINIC_OR_DEPARTMENT_OTHER): Payer: PRIVATE HEALTH INSURANCE

## 2010-01-06 ENCOUNTER — Encounter (HOSPITAL_BASED_OUTPATIENT_CLINIC_OR_DEPARTMENT_OTHER): Payer: Self-pay

## 2010-05-12 ENCOUNTER — Telehealth (HOSPITAL_BASED_OUTPATIENT_CLINIC_OR_DEPARTMENT_OTHER): Payer: Self-pay

## 2010-05-12 NOTE — Telephone Encounter (Signed)
T/c pt's with interpretor l/m on voicemail to call back clinic to confirm if Dr. Margo Aye is still PCP or not and if so to setup appt for Mammo.      Thanks April

## 2010-05-20 ENCOUNTER — Telehealth (HOSPITAL_BASED_OUTPATIENT_CLINIC_OR_DEPARTMENT_OTHER): Payer: Self-pay

## 2010-06-10 ENCOUNTER — Encounter (HOSPITAL_BASED_OUTPATIENT_CLINIC_OR_DEPARTMENT_OTHER): Payer: Self-pay

## 2010-06-24 ENCOUNTER — Other Ambulatory Visit (HOSPITAL_BASED_OUTPATIENT_CLINIC_OR_DEPARTMENT_OTHER): Payer: Self-pay

## 2010-06-24 ENCOUNTER — Ambulatory Visit (HOSPITAL_BASED_OUTPATIENT_CLINIC_OR_DEPARTMENT_OTHER): Payer: PRIVATE HEALTH INSURANCE

## 2010-06-24 ENCOUNTER — Encounter (HOSPITAL_BASED_OUTPATIENT_CLINIC_OR_DEPARTMENT_OTHER): Payer: Self-pay

## 2010-06-24 VITALS — BP 116/80 | HR 98 | Temp 97.2°F | Wt 154.0 lb

## 2010-06-24 DIAGNOSIS — M25569 Pain in unspecified knee: Principal | ICD-10-CM

## 2010-06-24 DIAGNOSIS — L259 Unspecified contact dermatitis, unspecified cause: Secondary | ICD-10-CM

## 2010-07-02 ENCOUNTER — Encounter (HOSPITAL_BASED_OUTPATIENT_CLINIC_OR_DEPARTMENT_OTHER): Payer: Self-pay

## 2010-07-02 ENCOUNTER — Other Ambulatory Visit: Payer: Self-pay

## 2010-07-02 LAB — MA SCREENING MAMMO BILATERAL WITH CAD

## 2010-07-03 LAB — XR KNEE LEFT 3 VIEWS

## 2010-07-22 ENCOUNTER — Ambulatory Visit (HOSPITAL_BASED_OUTPATIENT_CLINIC_OR_DEPARTMENT_OTHER): Payer: PRIVATE HEALTH INSURANCE

## 2010-07-22 ENCOUNTER — Encounter (HOSPITAL_BASED_OUTPATIENT_CLINIC_OR_DEPARTMENT_OTHER): Payer: Self-pay

## 2010-07-22 VITALS — BP 120/70 | Temp 97.6°F | Ht 59.25 in | Wt 151.0 lb

## 2010-07-22 DIAGNOSIS — F329 Major depressive disorder, single episode, unspecified: Secondary | ICD-10-CM

## 2010-07-22 DIAGNOSIS — Z124 Encounter for screening for malignant neoplasm of cervix: Secondary | ICD-10-CM

## 2010-07-22 DIAGNOSIS — Z0000 Encounter for general adult medical examination without abnormal findings: Principal | ICD-10-CM

## 2010-07-22 DIAGNOSIS — E7800 Pure hypercholesterolemia, unspecified: Secondary | ICD-10-CM

## 2010-07-22 DIAGNOSIS — H9209 Otalgia, unspecified ear: Secondary | ICD-10-CM

## 2010-07-28 LAB — CYTOPATH, C/V, THIN LAYER

## 2010-07-28 LAB — HEMOGLOBIN A1C
ESTIMATED AVERAGE GLUCOSE: 123 (ref 74–160)
HEMOGLOBIN A1C: 5.9 % (ref 0–6.0)

## 2010-07-28 LAB — VITAMIN D,25 HYDROXY: VITAMIN D,25 HYDROXY: 43.2 ng/ml (ref 30.0–100.0)

## 2010-07-28 LAB — CHOLESTEROL: Cholesterol: 220 mg/dl — ABNORMAL HIGH (ref 0–200)

## 2010-07-28 LAB — CHG LIPOPROTEIN DIR MEAS HIGH DENSITY CHOLESTEROL: HIGH DENSITY LIPOPROTEIN: 64 mg/dl (ref 35–85)

## 2010-07-28 LAB — CHG LIPOPROTEIN DIRECT MEASUREMENT LDL CHOLESTEROL: LOW DENSITY LIPOPROTEIN DIRECT: 140 mg/dl — ABNORMAL HIGH (ref 0–100)

## 2010-07-28 LAB — TSH (THYROID STIMULATING HORMONE): TSH (THYROID STIM HORMONE): 1.71 u[IU]/mL (ref 0.34–5.60)

## 2010-07-29 LAB — HUMAN PAPILLOMAVIRUS (HPV): HUMAN PAPILLOMAVIRUS: NEGATIVE

## 2010-08-16 ENCOUNTER — Encounter (HOSPITAL_BASED_OUTPATIENT_CLINIC_OR_DEPARTMENT_OTHER): Payer: Self-pay

## 2010-08-16 DIAGNOSIS — R7309 Other abnormal glucose: Secondary | ICD-10-CM

## 2010-08-16 HISTORY — DX: Other abnormal glucose: R73.09

## 2010-09-23 ENCOUNTER — Encounter (HOSPITAL_BASED_OUTPATIENT_CLINIC_OR_DEPARTMENT_OTHER): Payer: Self-pay

## 2010-09-23 NOTE — Progress Notes (Signed)
.    [facility] outreach letter mailed to patient's home. JC PN

## 2010-10-27 ENCOUNTER — Telehealth (HOSPITAL_BASED_OUTPATIENT_CLINIC_OR_DEPARTMENT_OTHER): Payer: Self-pay

## 2010-10-27 ENCOUNTER — Encounter (HOSPITAL_BASED_OUTPATIENT_CLINIC_OR_DEPARTMENT_OTHER): Payer: Self-pay

## 2010-11-10 ENCOUNTER — Ambulatory Visit (HOSPITAL_BASED_OUTPATIENT_CLINIC_OR_DEPARTMENT_OTHER): Payer: PRIVATE HEALTH INSURANCE

## 2010-11-26 ENCOUNTER — Ambulatory Visit (HOSPITAL_BASED_OUTPATIENT_CLINIC_OR_DEPARTMENT_OTHER): Payer: PRIVATE HEALTH INSURANCE | Admitting: Family Medicine

## 2010-11-26 ENCOUNTER — Encounter (HOSPITAL_BASED_OUTPATIENT_CLINIC_OR_DEPARTMENT_OTHER): Payer: Self-pay | Admitting: Family Medicine

## 2010-11-26 VITALS — BP 122/78 | HR 90 | Temp 97.6°F | Ht 59.25 in | Wt 156.0 lb

## 2010-11-26 DIAGNOSIS — G4700 Insomnia, unspecified: Secondary | ICD-10-CM

## 2010-11-26 DIAGNOSIS — F329 Major depressive disorder, single episode, unspecified: Secondary | ICD-10-CM

## 2010-11-26 DIAGNOSIS — R5383 Other fatigue: Principal | ICD-10-CM

## 2010-11-26 LAB — CBC, PLATELET & DIFFERENTIAL
BASOPHIL %: 0.6 % (ref 0.0–2.0)
EOSINOPHIL %: 1.5 % (ref 0.0–7.0)
HEMATOCRIT: 38.7 % (ref 36.0–48.0)
HEMOGLOBIN: 13.2 g/dl (ref 12.0–16.0)
LYMPHOCYTE %: 41.6 % — ABNORMAL HIGH (ref 13.0–39.0)
MEAN CORP HGB CONC: 34 g/dl (ref 32.0–36.0)
MEAN CORPUSCULAR HGB: 31.6 pg (ref 27.0–33.0)
MEAN CORPUSCULAR VOL: 92.9 fl (ref 80.0–100.0)
MEAN PLATELET VOLUME: 9.4 fl (ref 6.4–10.8)
MONOCYTE %: 5.7 % (ref 1.0–12.0)
NEUTROPHIL %: 50.6 % (ref 46.0–79.0)
PLATELET COUNT: 268 10*3/uL (ref 150–400)
RBC DISTRIBUTION WIDTH: 13.6 % (ref 11.5–14.3)
RED BLOOD CELL COUNT: 4.17 M/uL — ABNORMAL LOW (ref 4.50–5.10)
WHITE BLOOD CELL COUNT: 8.3 10*3/uL (ref 4.0–10.8)

## 2010-11-26 MED ORDER — DIAZEPAM 5 MG PO TABS
5.0000 mg | ORAL_TABLET | Freq: Two times a day (BID) | ORAL | Status: DC | PRN
Start: 2010-11-26 — End: 2011-06-17

## 2010-11-26 MED ORDER — FLUOXETINE HCL 20 MG PO CAPS
20.0000 mg | ORAL_CAPSULE | Freq: Every day | ORAL | Status: DC
Start: 2010-11-26 — End: 2012-01-11

## 2010-12-07 ENCOUNTER — Other Ambulatory Visit (HOSPITAL_BASED_OUTPATIENT_CLINIC_OR_DEPARTMENT_OTHER): Payer: Self-pay | Admitting: Clinical

## 2011-02-01 ENCOUNTER — Encounter (HOSPITAL_BASED_OUTPATIENT_CLINIC_OR_DEPARTMENT_OTHER): Payer: Self-pay | Admitting: Family Medicine

## 2011-02-01 ENCOUNTER — Ambulatory Visit (HOSPITAL_BASED_OUTPATIENT_CLINIC_OR_DEPARTMENT_OTHER): Payer: PRIVATE HEALTH INSURANCE | Admitting: Family Medicine

## 2011-02-01 VITALS — BP 118/88 | HR 73 | Temp 97.7°F | Ht 59.5 in | Wt 143.0 lb

## 2011-02-01 DIAGNOSIS — R0789 Other chest pain: Principal | ICD-10-CM

## 2011-02-01 MED ORDER — NAPROXEN 500 MG PO TABS
500.0000 mg | ORAL_TABLET | Freq: Two times a day (BID) | ORAL | Status: DC
Start: 2011-02-01 — End: 2013-02-21

## 2011-02-01 NOTE — Progress Notes (Signed)
SUBJECTIVE:  Stephanie [name] is a 50 year old female who presents with left chest pain for 1 week.  4 days ago it got worse and today it continues and may be a little worse.  No discharge or redness.  Painful when she touches it and when she moves her are and torso.  Sometimes feels like the area is burning. She started going to the gym a month ago.  1 week ago bumped her left chest on a bed while cleaning and the pain started after that point.    ROS:  No fevers but some chills.      ALLLERGIES:   ALLLERGIES:  Review of Patient's Allergies indicates:  No Known Allergies      OBJECTIVE:    VITALS: VITALS:  BP 118/88  Pulse 73  Temp(Src) 97.7 F (36.5 C) (Temporal)  Ht 4' 11.5" (1.511 m)  Wt 143 lb (64.864 kg)  BMI 28.40 kg/m2  SpO2 97%  LMP 07/11/2005  Constitutional:  Well developed, Well nourished, No acute distress, Non-toxic appearance.   Left chest:  With tenderness over Left pectoralis muscle.  Pain with movement of left arm and using abdominal muscles.  No redness or swelling.    ASSESSMENT/PLAN:   Muscle strain:  Stretching, heat, and massage.  Naprosyn bid for 10 days with food.  Discussed possible stomach symptoms.    1. The patient indicates understanding of these issues and agrees with the plan.  2.  The patient is given an After Visit Summary sheet that lists all of their medications with directions, their allergies, orders placed during this encounter, immunization dates, and follow- up instructions.  3. I reviewed the patient's medical information, allergies, and social history and reconciled the patient's medication list.        Electronically signed by: Tawny Asal, MD, 07/20/2010 7:17 PM  This note is electronically signed in the electronic medical record.

## 2011-04-16 ENCOUNTER — Ambulatory Visit (HOSPITAL_BASED_OUTPATIENT_CLINIC_OR_DEPARTMENT_OTHER): Payer: PRIVATE HEALTH INSURANCE | Admitting: Family Medicine

## 2011-05-18 ENCOUNTER — Encounter (HOSPITAL_BASED_OUTPATIENT_CLINIC_OR_DEPARTMENT_OTHER): Payer: Self-pay

## 2011-06-17 ENCOUNTER — Other Ambulatory Visit (HOSPITAL_BASED_OUTPATIENT_CLINIC_OR_DEPARTMENT_OTHER): Payer: Self-pay | Admitting: Family Medicine

## 2011-06-17 ENCOUNTER — Encounter (HOSPITAL_BASED_OUTPATIENT_CLINIC_OR_DEPARTMENT_OTHER): Payer: Self-pay | Admitting: Family Medicine

## 2011-06-17 ENCOUNTER — Ambulatory Visit (HOSPITAL_BASED_OUTPATIENT_CLINIC_OR_DEPARTMENT_OTHER): Payer: Self-pay | Admitting: Family Medicine

## 2011-06-17 VITALS — BP 120/90 | HR 90 | Temp 97.8°F | Wt 137.0 lb

## 2011-06-17 DIAGNOSIS — G4700 Insomnia, unspecified: Secondary | ICD-10-CM

## 2011-06-17 DIAGNOSIS — M25511 Pain in right shoulder: Secondary | ICD-10-CM

## 2011-06-17 DIAGNOSIS — Z23 Encounter for immunization: Secondary | ICD-10-CM

## 2011-06-17 DIAGNOSIS — R002 Palpitations: Secondary | ICD-10-CM

## 2011-06-17 DIAGNOSIS — R200 Anesthesia of skin: Secondary | ICD-10-CM

## 2011-06-17 DIAGNOSIS — M25569 Pain in unspecified knee: Secondary | ICD-10-CM

## 2011-06-17 DIAGNOSIS — Z1239 Encounter for other screening for malignant neoplasm of breast: Principal | ICD-10-CM

## 2011-06-17 MED ORDER — DIAZEPAM 5 MG PO TABS
5.0000 mg | ORAL_TABLET | Freq: Two times a day (BID) | ORAL | Status: AC | PRN
Start: 2011-06-17 — End: 2011-07-18

## 2011-06-17 NOTE — Progress Notes (Addendum)
SUBJECTIVE  Stephanie [name] is a 50 yo woman who presents with complaint of intermittent knee pain  Sharp pain when going up or down stairs; feels like cracking sometimes  Has taken naproxen, Ibuprofen and tylenol which help moderately   Also takes [place] medication provided by pharmacist daughter and relieves pain completely    Reports bilateral hand numbness x 2 weeks; made it difficult holding steering wheel; no paresthesias;   Numbness in legs as well but only at night when they lose circulation;    Chest pain -  Once a week occurs in the evening; lasts 5 mins, non-exertional; palpitations and feels soreness inside; no SOB or radiation;no chest pain today.     Difficulty falling/staying asleep; would like refill of valium; last received 11/26/10    Continues to experience  R shoulder pain; diagnosed with bursitis long time ago and would like PT    Family History    Cancer - Other Father     Comment: stomach    Heart Brother     Comment: died at 66 during valve replacement    Heart Brother     Comment: chestpain with neg w/u    Heart Mother     Comment: Died of MI age 61       Review of Systems   No melena or hematochezia; no smoking; one beer on occasion    OBJECTIVE  BP 120/90  Pulse 90  Temp(Src) 97.8 F (36.6 C) (Oral)  Wt 137 lb (62.143 kg)  SpO2 100%  LMP 07/11/2005  General: NAD, well-appearing, pleasant and cooperative  Skin: without rashes or erythema  Musculoskeletal: 5/5 SAR of LE b/l; 4/5 SAR R UE; 5/5 SAR L UE: tenderness with palpation of anterior and superior glenohumeral joint; cannot elevate R shoulder past 90 degrees; FROM of L shoulder;   Ext: mild swelling in LE, non-pitting; no cords  Neuro: Cranial nerves grossly intact; sensation intact bilaterally; reflexes equal and symmetrical    ASSESSMENT  V76.71F Screening for malignant neoplasm of breast  (primary encounter diagnosis)  Comment:due for mammo  Plan: ORDER FOR MAMMOGRAM (SCREENING)      1) discussed results from last  screening    782.0D Numbness  Comment: ? B12 deficiency; no neurologically focal deficits  Plan: VITAMIN B-12    785.1 Palpitations/atypical chest   Comment: ? Anxiety given palpitations occur with chest soreness every week; r/o thyroid involvement;  No report of EKG today as patient reports no chest pain; will discuss with Dr. Margo Aye regarding stress test; pt low risk given no HTN, HDL 64, LDL 140 and family history only significant for valvular disease   Plan: THYROID STIM HORMONE    719.46D Knee pain  Comment: most likely arthritis given difficulty with stairs and cracking feeling; will check vit D   1) offered Ibuprofen 800 mg every 8 hours as needed for pain x 10 days  2) May try tylenol 500 mg every 4-6 hours as needed for pain; not to exceed 4g daily  3) warm compresses applied to knee nightly  4) mild non-pitting edema on exam, spends all day on feet - compression stockings and elevation  5) PT referral  Plan: REFERRAL TO PHYSICAL THERAPY ( INT), VITAMIN         D,25 HYDROXY      V04.81 Flu Vaccine Given  Plan: IMMUNIZATION ADMIN SINGLE, RN, FLU VACC SPLIT         VIRUS 3 & > (PUBLIC)     780.52A  Insomnia  Comment: discussed with Dr. Katrinka Blazing; refilled script as no suspicion of abuse; if pt continues to have symptoms would consider change to ambien  Plan: diazepam (VALIUM) 5 MG tablet    719.41AP Shoulder pain, right  Comment: ?frozen shoulder given duration of pain (years), weakness, and reduced ROM  Plan: REFERRAL TO PHYSICAL THERAPY ( INT)

## 2011-06-17 NOTE — Progress Notes (Signed)
Vaccine given in right deltoid without difficulty. Stephanie [name] denies being allergic to eggs, being pregnant, having Guillian Barre Syndrome.  She was given recent VIS.  Patient understands teaching. Stephanie [name] denies any questions.

## 2011-06-17 NOTE — Patient Instructions (Signed)
Compression stocking for leg swelling - medical supply store  Can take Ibuprofen 800mg  as needed for pain with food;   Tylenol 500 mg as needed for pain every 4-6 hours do not exceed 4g or 8 pills

## 2011-06-17 NOTE — Telephone Encounter (Signed)
Called with interpretor;  Refilled valium; faxed to pharmacy;   Also advised pt to take either naproxen or Ibuprofen; not both at once  [facility] PA-C

## 2011-06-18 ENCOUNTER — Encounter (HOSPITAL_BASED_OUTPATIENT_CLINIC_OR_DEPARTMENT_OTHER): Payer: Self-pay

## 2011-06-18 ENCOUNTER — Telehealth (HOSPITAL_BASED_OUTPATIENT_CLINIC_OR_DEPARTMENT_OTHER): Payer: Self-pay | Admitting: Family Medicine

## 2011-06-18 NOTE — Telephone Encounter (Signed)
Mammo scheduled at [facility] on Wed, 07/07/11 at 3:00pm

## 2011-06-21 LAB — VITAMIN D,25 HYDROXY: VITAMIN D,25 HYDROXY: 58.9 ng/ml (ref 30.0–100.0)

## 2011-06-21 LAB — VITAMIN B12: VITAMIN B12: 578 pg/mL (ref 180–914)

## 2011-06-21 LAB — TSH (THYROID STIMULATING HORMONE): TSH (THYROID STIM HORMONE): 0.92 u[IU]/mL (ref 0.34–5.60)

## 2011-06-29 ENCOUNTER — Telehealth (HOSPITAL_BASED_OUTPATIENT_CLINIC_OR_DEPARTMENT_OTHER): Payer: Self-pay | Admitting: Registered Nurse

## 2011-06-29 NOTE — Telephone Encounter (Signed)
Message copied by Darral Dash on Tue Jun 29, 2011  1:50 PM  ------       Message from: Madilyn Fireman       Created: Tue Jun 29, 2011 12:43 PM       Regarding: Test Results         [facility]              Stephanie [name] [id number], 50 year old, female, Telephone Information:       Home Phone      [phone]       Work Phone      Not on file.       Mobile          [phone]                     [name] NUMBER: [phone]       Best time to call back:        Cell phone:        Other phone:              Available times:              Patient's language of care: [place]              Patient needs a [place] interpreter.              Patient's PCP: Corena Herter, MD              Person calling on behalf of patient: Patient (self)              Calls today for test result(s). Patient received lab results but it was all in english.. Patient would like to speak with the nurse about her results.              Patient's Preferred Pharmacy:        [facility] ([facility]              Phone: [phone] Fax: [phone]              [facility]              Phone: [phone] Fax: [phone]

## 2011-06-29 NOTE — Telephone Encounter (Signed)
[place] [employer] claims handler Used. Returned call to patient. Patient wanted to know what results of her labs were. Patient unable to read in English Discussed normal labs and also confirmed Mammo appointment for 07/07/11. Will have front desk make follow up appointment after mammo date. Patient understands and agrees to plan.       Nallah Hermreck  [address] [place] [address]        Prezado/a Stephanie [name],    O resultado do seu exame de laboratrio mais recente foi:      Your blood tests for Vit B12, Vit D, and thyroid hormone were normal. Please try the therapies we discussed to see if they improve your pain. Also remember to book you next routine physical exam if you have not already done so.    Por favor, telefone para a clnica [phone]) se tiver alguma pergunta.   Eu ou a enfermeira lhe telefonaremos de volta.    ______________________  Rexene Agent PA-C

## 2011-07-07 ENCOUNTER — Ambulatory Visit (HOSPITAL_BASED_OUTPATIENT_CLINIC_OR_DEPARTMENT_OTHER): Payer: Self-pay

## 2011-07-07 DIAGNOSIS — Z1231 Encounter for screening mammogram for malignant neoplasm of breast: Principal | ICD-10-CM

## 2011-07-08 LAB — MA SCREENING MAMMO BILATERAL WITH CAD

## 2011-07-20 ENCOUNTER — Encounter (HOSPITAL_BASED_OUTPATIENT_CLINIC_OR_DEPARTMENT_OTHER): Payer: Self-pay | Admitting: Family Medicine

## 2011-07-20 ENCOUNTER — Ambulatory Visit (HOSPITAL_BASED_OUTPATIENT_CLINIC_OR_DEPARTMENT_OTHER): Payer: Self-pay | Admitting: Family Medicine

## 2011-07-20 VITALS — BP 112/74 | HR 80 | Temp 98.3°F | Wt 140.2 lb

## 2011-07-20 DIAGNOSIS — R200 Anesthesia of skin: Principal | ICD-10-CM

## 2011-07-20 NOTE — Progress Notes (Signed)
Subjective  50 yo woman who presents with persistent hand numbness;  Last seen for this 06/17/11. B12, vit D, and TSH at the time normal.  Numbness is intermittent and painful and mostly occurs while driving and at night while she lays in bed.  Sometimes hands fill stiff. No redness or swelling.  Reports no alleviating factors and no radiation.  Denies wrist pain.  Has occurred for years but worst over this past year.    SH: non-smoker; cleans homes    Also reports breast enlargement surgery December 3 for which she would like labs and PE.    Review of Systems   Denies chest pain, SOB, paresthesias, and weakness;  Denies TIA and stoke    OBJECTIVE  BP 112/74  Pulse 80  Temp(Src) 98.3 F (36.8 C) (Temporal)  Wt 140 lb 4 oz (63.617 kg)  SpO2 100%  LMP 07/11/2005  General: NAD, well-appearing, pleasant and cooperative  HEENT: atraumatic, normocephalic; PERRL, EOMI; oropharynx clear and without tonsillar exudates or hypertrophy; neck without tenderness or lymphadenopathy;   Skin: without rashes or erythema  Lungs: clear to ascultation bilaterally, no wheezing or rales  Cardio: regular rate and rhythm; normal s1 and s2; no murmurs, rubs, or gallops;  Musculoskeletal: 5/5 SAR UE; no tenderness in upper extremity joints  Ext: without edema, erythema, or tenderness; cap refill <2 secs in UE; negative Phalen's and tinel's; no atrophy of thenar prominence  Neuro: alert and oriented x 3; CN 2-12 grossly intact; sharp and dull touch intact; reflexes equal and symmetrical;    ASSESSMENT  782.0HL Numbness in both hands  (primary encounter diagnosis)  Comment: 50 yo woman presents with complaint of persistent hand numbness occuring at night and while driving. Has not improved since her last visit in Oct. B12, vit D, and TSH normal. No pain in epicondyles to suggest epicondylitis. Neg Tinel's and  Phalen's. Distribution of numbness in c/w carpal tunnel. Unclear as to cause of patient's numbness. Will discuss  possible referral to neuro with Dr. Margo Aye.    Patient will book pre-op exam for next week and bring documents with the requested labs.

## 2011-07-28 ENCOUNTER — Encounter (HOSPITAL_BASED_OUTPATIENT_CLINIC_OR_DEPARTMENT_OTHER): Payer: Self-pay | Admitting: Family Medicine

## 2011-07-28 ENCOUNTER — Ambulatory Visit (HOSPITAL_BASED_OUTPATIENT_CLINIC_OR_DEPARTMENT_OTHER): Payer: PRIVATE HEALTH INSURANCE | Admitting: Family Medicine

## 2011-07-28 VITALS — BP 120/80 | HR 94 | Temp 97.0°F | Ht 59.5 in | Wt 143.0 lb

## 2011-07-28 DIAGNOSIS — R200 Anesthesia of skin: Secondary | ICD-10-CM

## 2011-07-28 DIAGNOSIS — Z01818 Encounter for other preprocedural examination: Principal | ICD-10-CM

## 2011-07-28 LAB — CBC, PLATELET & DIFFERENTIAL
ABSOLUTE BASO COUNT: 0 10*3/uL (ref 0.0–0.1)
ABSOLUTE EOSINOPHIL COUNT: 0.2 10*3/uL (ref 0.0–0.8)
ABSOLUTE IMM GRAN COUNT: 0.02 10*3/uL (ref 0.00–0.03)
ABSOLUTE LYMPH COUNT: 2 10*3/uL (ref 0.6–5.9)
ABSOLUTE MONO COUNT: 0.6 10*3/uL (ref 0.2–1.4)
ABSOLUTE NEUTROPHIL COUNT: 4.2 10*3/uL (ref 1.6–8.3)
BASOPHIL %: 0.4 % (ref 0.0–1.2)
EOSINOPHIL %: 3.1 % (ref 0.0–7.0)
HEMATOCRIT: 40.4 % (ref 34.1–44.9)
HEMOGLOBIN: 13.2 g/dL (ref 11.2–15.7)
IMMATURE GRANULOCYTE %: 0.3 % (ref 0.0–0.4)
LYMPHOCYTE %: 29 % (ref 15.0–54.0)
MEAN CORP HGB CONC: 32.7 g/dL (ref 31.0–37.0)
MEAN CORPUSCULAR HGB: 30.8 pg (ref 26.0–34.0)
MEAN CORPUSCULAR VOL: 94.4 fL (ref 80.0–100.0)
MEAN PLATELET VOLUME: 11.6 fL (ref 8.7–12.5)
MONOCYTE %: 8.1 % (ref 4.0–13.0)
NEUTROPHIL %: 59.1 % (ref 40.0–75.0)
PLATELET COUNT: 299 10*3/uL (ref 150–400)
RBC DISTRIBUTION WIDTH STD DEV: 46.3 fL (ref 35.1–46.3)
RBC DISTRIBUTION WIDTH: 13.3 % (ref 11.5–14.3)
RED BLOOD CELL COUNT: 4.28 M/uL (ref 3.90–5.20)
WHITE BLOOD CELL COUNT: 7 10*3/uL (ref 4.0–11.0)

## 2011-07-28 NOTE — Patient Instructions (Addendum)
Admission Note  Stephanie [name] is a 50 year old female who is here for a preoperative physical.  She has an upcoming breast augmentation surgery 08/02/2011.   Patient issues currently are bilateral hand numbness x one month occuring at night and while driving.  I have fully reviewed the past medical, surgical, social and family history and updated the Histories section of Epic[employer]. I have also reviewed patient's most recent mammogram.  Patient's last menstrual period was 07/11/2005.     Obstetric History   G3  P3  T3  P0  TAB0  SAB0  E0  M0  L0   Patient Active Problem List    Other plastic surgery for unacceptable cosmetic appearance [V50.1]         Date Noted: 07/30/2011            Breast augmentation scheduled 08/02/11;     Dr.  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  Trial of anticholinergic,            refer to urology, has appt Dr Ricki Miller 02/23/08      Major Depressive Disorder [296.20BB]         Class: Continuation         Date Noted: 10/11/2007            3/09: PHq9 = 12, encouraged to increase celexa to            20 mg daily.  No SI/HI.              Schedule appt with Yaneisi Hutchins            6/09: PHQ9 = 15 today, pt stopped taking meds 2-3            wks ago.  Does not want to restart.  Also declines            counseling.            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  Has appt            scheduled with             nutritonist in [place]            11/11: LDL 140, elevated but not at level where            meds recommended, LDL goal             < 160, continue to discuss diet and exercise      FAMILY HX GI MALIGNANCY [V16.0]         Date Noted: 07/03/2004      ANEMIA NOS [285.9]         Date Noted: 06/18/2004            02/10/2006 due to fibroids most likely; had            hysterectomy            10/08 CBC wnl      URINARY CALCULUS NOS [592.9]         Date Noted: 06/18/2004        Past History:  No past medical history on file.        Past Surgical History    OB ANTEPARTUM CARE C DLVR&POSTPARTUM     Comment x3    TAH +-RMVL TUBE +-RMVL OVARY     Comment for fibroids, still has cervix         Hospitalizations: [facility], reason: Abdominal Pain Date: 01/2005       Family History    Cancer - Other Father     Comment: stomach    Heart Brother     Comment: died at 27 during valve replacement    Heart Brother     Comment: chestpain with neg w/u    Heart Mother     Comment: Died of MI age 50         Social History Main Topics   Smoking status: Former cigarette smoker quit 10 years ago    Smokeless tobacco:     Alcohol Use: Yes  10.0 oz/week     Comment: weekends    Drug Use: No    Sexually Active: Not Currently  Partner(s): Female    Comment: G3P3, no hx STD, no hx abn Pap      Allergy History:  Review of patient's allergies indicates no known allergies.      Current outpatient prescriptions:  naproxen (NAPROSYN) 500 MG tablet Take 1 tablet by mouth 2 (two) times daily with meals. For 10 days.  Then you can take twice a day as  needed for pain. Disp: 60 tablet Rfl: 5   fluoxetine (PROZAC) 20 MG capsule Take 1 capsule by mouth daily. Disp: 30 capsule Rfl: 6   OMEGA 3 1000 MG OR CAPS 1 taily Disp:  Rfl:  REVIEW OF SYSTEMS:  Skin: negative for rash and bruising  Eyes: negative for visual blurring, double vision and eye pain  Ears/Nose/Throat: negative for deafness, tinnitus, bleeding gums and ear pain  Respiratory: negative for cough and shortness of breath  Cardiovascular: negative for palpitations and chest pain  Gastrointestinal: negative for poor appetite, abdominal pain, melena, hematochezia, constipation and diarrhea  Genitourinary: negative for dysuria and hematuria  Musculoskeletal: negative for joint stiffness, muscle atrophy and joint swelling  Neurologic: numbness of hands x one month  Psychiatric: negative for sleep disturbance, illegal drug usage and suicidal ideation  Hematologic/Lymphatic/Immunologic: negative for fever, chills and weight loss  Endocrine: negative goiter, polydipsia and polyuria      PHYSICAL EXAMINATION:  Patient's last menstrual period was 07/11/2005.   BP 120/80  Pulse 94  Temp(Src) 97 F (36.1 C) (Temporal)  Ht 4' 11.5" (1.511 m)  Wt 143 lb (64.864 kg)  BMI 28.40 kg/m2  SpO2 100%  LMP 07/11/2005   General appearance: healthy, alert, well developed, well nourished  Skin: skin color, texture, turgor are normal  Head: Normocephalic. No masses, lesions, tenderness or abnormalities  Eyes: conjunctivae/corneas clear. PERRL, EOM's intact. Fundi benign  Ears: External ears normal. Canals clear. TM's normal.  Nose/Sinuses: Nares normal. Septum midline. Mucosa normal. No drainage or sinus tenderness.  Oropharynx: Lips, mucosa, and tongue normal. Teeth and gums normal. Oropharynx moist and without lesion  Neck: Neck supple. No adenopathy. Thyroid symmetric, normal size, and without nodularity  Back: Back symmetric, no curvature. ROM normal. No CVA tenderness.  Lungs: Good diaphragmatic excursion. Lungs  clear to auscultation bilaterally  Heart: PMI normal. No lifts, heaves, or thrills. RRR. No murmurs, clicks, gallops or rubs  Breasts: Inspection negative. No nipple discharge or bleeding. No masses or nodularity palpable  Abdomen: Abdomen soft, non-tender. BS normal. No masses, no organomegaly  Extremities: Extremities normal. No deformities, edema, or skin discoloration  Musculoskeletal: Spine ROM normal. Muscular strength intact.  Peripheral pulses: radial=4/4  Neuro: Gait normal. Reflexes normal and symmetric. Sensation grossly normal   Pelvic: deferred  EKG: normal EKG, normal sinus rhythm. Without ST abnormalities.      ASSESSMENT:  1) pre-op assessment    PLAN:  Patient is clear for surgery.    782.0D Numbness  Comment: ? Carpal tunnel; Per 07/20/11 note patient had negative phalen's, tinnels, and wrist pain. Last A1c in 2011 5.9%, unlikely peripheral neuropathy. I have discussed these findings with patient's PCP who suggests b/l hand splinting at night and while driving. Ordered RPR today to r/o possible syphilis. RPR is negative.  Plan: TREPONEMA PALLIDUM ANTIBODY (RPR)      07/28/2011 9:13 AM     _____________________________________  Rexene Agent PA-C   Pager: [phone]    Plastic Surgeon: Dr. Corliss Parish  Phone#: [phone]    Admission Note  Stephanie [name] is a 50 year old female who is here for a preoperative physical.  She has an upcoming breast augmentation surgery 08/02/2011.   Patient issues currently are bilateral hand numbness x one month occuring at night and while driving.  I have fully reviewed the past medical, surgical, social and family history and updated the Histories section of EpicCare. I have also reviewed patient's most recent mammogram.  Patient's last menstrual period was 07/11/2005.     Obstetric History   G3  P3  T3  P0  TAB0  SAB0  E0  M0  L0   Patient Active Problem List    Other plastic surgery for unacceptable cosmetic  appearance [V50.1]         Date Noted: 07/30/2011             Breast augmentation scheduled 08/02/11;     Dr.  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  Trial of anticholinergic,            refer to urology, has appt Dr Ricki Miller 02/23/08      Major Depressive Disorder [296.20BB]         Class: Continuation         Date Noted: 10/11/2007            3/09: PHq9 = 12, encouraged to increase celexa to            20 mg daily.  No SI/HI.              Schedule appt with Mauriana Fogleman            6/09: PHQ9 = 15 today, pt stopped taking meds 2-3            wks ago.  Does not want to restart.  Also declines            counseling.            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  Has appt  scheduled with             nutritonist in [place]            11/11: LDL 140, elevated but not at level where            meds recommended, LDL goal             < 160, continue to discuss diet and exercise      FAMILY HX GI MALIGNANCY [V16.0]         Date Noted: 07/03/2004      ANEMIA NOS [285.9]          Date Noted: 06/18/2004            02/10/2006 due to fibroids most likely; had            hysterectomy            10/08 CBC wnl      URINARY CALCULUS NOS [592.9]         Date Noted: 06/18/2004        Past History:  No past medical history on file.        Past Surgical History    OB ANTEPARTUM CARE C DLVR&POSTPARTUM     Comment x3    TAH +-RMVL TUBE +-RMVL OVARY     Comment for fibroids, still has cervix         Hospitalizations: [facility], reason: Abdominal Pain Date: 01/2005       Family History    Cancer - Other Father     Comment: stomach    Heart Brother     Comment: died at 26 during valve replacement    Heart Brother     Comment: chestpain with neg w/u    Heart Mother     Comment: Died of MI age 10         Social History Main Topics   Smoking status: Former cigarette smoker quit 10 years ago    Smokeless tobacco:     Alcohol Use: Yes  10.0 oz/week     Comment: weekends    Drug Use: No    Sexually Active: Not Currently  Partner(s): Female    Comment: G3P3, no hx STD, no hx abn Pap      Allergy History:  Review of patient's allergies indicates no known allergies.      Current outpatient prescriptions:  naproxen (NAPROSYN) 500 MG tablet Take 1 tablet by mouth 2 (two) times daily with meals. For 10 days.  Then you can take twice a day as needed for pain. Disp: 60 tablet Rfl: 5   fluoxetine (PROZAC) 20 MG capsule Take 1 capsule by mouth daily. Disp: 30 capsule Rfl: 6   OMEGA 3 1000 MG OR CAPS 1 taily Disp:  Rfl:          REVIEW OF SYSTEMS:  Skin: negative for rash and bruising  Eyes: negative for visual blurring, double vision and eye pain  Ears/Nose/Throat: negative for deafness, tinnitus, bleeding gums and ear pain  Respiratory: negative for cough and shortness of breath  Cardiovascular: negative for palpitations and chest pain  Gastrointestinal: negative for poor appetite, abdominal pain, melena, hematochezia, constipation and diarrhea  Genitourinary: negative for dysuria and hematuria  Musculoskeletal:  negative for joint stiffness, muscle atrophy and joint swelling  Neurologic: numbness of hands x one month  Psychiatric: negative for sleep disturbance, illegal drug usage and suicidal ideation  Hematologic/Lymphatic/Immunologic: negative for fever, chills and weight loss  Endocrine: negative goiter, polydipsia and polyuria      PHYSICAL EXAMINATION:  Patient's last menstrual period was 07/11/2005.   BP 120/80  Pulse 94  Temp(Src) 97 F (36.1 C) (Temporal)  Ht 4' 11.5" (1.511 m)  Wt 143 lb (64.864 kg)  BMI 28.40 kg/m2  SpO2 100%  LMP 07/11/2005   General appearance: healthy, alert, well developed, well nourished  Skin: skin color, texture, turgor are normal  Head: Normocephalic. No masses, lesions, tenderness or abnormalities  Eyes: conjunctivae/corneas clear. PERRL, EOM's intact. Fundi benign  Ears: External ears normal. Canals clear. TM's normal.  Nose/Sinuses: Nares normal. Septum midline. Mucosa normal. No drainage or sinus tenderness.  Oropharynx: Lips, mucosa, and tongue normal. Teeth and gums normal. Oropharynx moist and without lesion  Neck: Neck supple. No adenopathy. Thyroid symmetric, normal size, and without nodularity  Back: Back symmetric, no curvature. ROM normal. No CVA tenderness.  Lungs: Good diaphragmatic excursion. Lungs clear to auscultation bilaterally  Heart: PMI normal. No lifts, heaves, or thrills. RRR. No murmurs, clicks, gallops or rubs  Breasts: Inspection negative. No nipple discharge or bleeding. No masses or nodularity palpable  Abdomen: Abdomen soft, non-tender. BS normal. No masses, no organomegaly  Extremities: Extremities normal. No deformities, edema, or skin discoloration  Musculoskeletal: Spine ROM normal. Muscular strength intact.  Peripheral pulses: radial=4/4  Neuro: Gait normal. Reflexes normal and symmetric. Sensation grossly normal   Pelvic: deferred  EKG: normal EKG, normal sinus rhythm. Without ST abnormalities.      ASSESSMENT:  1) pre-op  assessment    PLAN:  Patient is clear for surgery.    782.0D Numbness  Comment: ? Carpal tunnel; Per 07/20/11 note patient had negative phalen's, tinnels, and wrist pain. Last A1c in 2011 5.9%, unlikely peripheral neuropathy. I have discussed these findings with patient's PCP who suggests b/l hand splinting at night and while driving. Ordered RPR today to r/o possible syphilis. RPR is negative.  Plan: TREPONEMA PALLIDUM ANTIBODY (RPR)      07/28/2011 9:13 AM     _____________________________________  Rexene Agent PA-C   Pager: [phone]    Plastic Surgeon: Dr. Corliss Parish  Phone#: [phone]        Sndrome do Tnel do Carpo  (Carpal Tunnel Syndrome)        O tnel do carpo  uma rea oca e estreita no punho.  formado pelos ossos e ligamentos do punho. Os nervos, vasos sangneos e tendes no lado da palma (o lado da sua mo na direo em que os seus dedos dobram) da sua mo passam pelo tnel do carpo. O movimento repetido do punho ou certas doenas podem causar inchao dentro do tnel. (Essa  a razo pela qual so [employer] de leses por esforo repetitivo.  tambm um problema comum no fim da [place].) Esse inchao pina o nervo principal do punho (nervo mediano) e causa a condio dolorida chamada de sndrome do tnel do carpo. Pode-se observar uma sensao de alfinetadas e agulhadas nos dedos ou na mo; entretanto, o brao inteiro pode doer por causa disso. A sndrome do tnel do carpo pode se resolver por si mesma. Injees de cortisona podem ajudar. s vezes, pode ser necessria uma operao para liberar o nervo pinado. Um eletromiograma (um tipo de teste) pode ser necessrio para confirmar esse diagnstico. Esse  um teste que mede a conduo nervosa. A conduo nervosa fica normalmente diminuda na sndrome do tnel do carpo.       INSTRUES PARA TRATAMENTO DOMICILIAR    Se o seu  mdico receitou medicao para ajudar a reduzir o inchao, tome-a conforme orientado.    Se lhe derem uma tala  para impedir que o seu punho dobre, use-a conforme indicado.  importante usar a tala  noite. Use a tala enquanto tiver dor ou formigamento na mo, brao ou punho. Isso pode durar de 1 a 2 meses.    Se tiver dor  noite, pode ajudar se voc esfregar ou sacudir a mo, ou erguer a mo acima do nvel do corao (no centro do seu peito).     importante dar um descanso para o seu punho ao parar as atividades que causam o problema. Se os seus sintomas forem relacionados ao trabalho, talvez voc precise conversar com seu empregador para mudar para uma funo que no exija o uso do punho.    S tome no balcao ou medicinas de receita para dor, incmodo, ou febre como dirigido por seu mdico.   Aps perodos de uso prolongado, principalmente de uso intenso, aplicar uma bolsa de gelo embrulhada numa toalha no lado anterior (palma) do punho afetado durante 20 a 30 minutos. Repita conforme necessrio de trs a quatro vezes por dia. Isso ajudar a reduzir o inchao.         PROCURE ASSISTNCIA MDICA IMEDIATAMENTE SE:   Se voc ainda tiver dor e formigamento aps uma semana de tratamento.     Se apresentar novos sintomas inexplicados.     Se os seus sintomas atuais piorarem e no melhorarem nem forem controlados com medicaes.       CERTIFIQUE-SE DE:   Compreende as instrues referentes  alta.     Ir monitorar sua condio.   Procurar assistncia mdica imediatamente, conforme indicado.     Document Released: 08/16/2005  Document Re-Released: 11/12/2008  [facility] Patient Information 2011 [place], [place].

## 2011-07-28 NOTE — Progress Notes (Signed)
Admission Note  Stephanie [name] is a 50 year old female who is here for a preoperative physical.  She has an upcoming breast augmentation surgery 08/02/2011.   Patient issues currently are bilateral hand numbness x one month occuring at night and while driving.  I have fully reviewed the past medical, surgical, social and family history and updated the Histories section of EpicCare. I have also reviewed patient's most recent mammogram.  Patient's last menstrual period was 07/11/2005.     Obstetric History   G3  P3  T3  P0  TAB0  SAB0  E0  M0  L0   Patient Active Problem List    Other plastic surgery for unacceptable cosmetic appearance [V50.1]         Date Noted: 07/30/2011            Breast augmentation scheduled 08/02/11;     Dr.  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  Trial of anticholinergic,            refer to urology, has appt Dr Ricki Miller 02/23/08      Major Depressive Disorder [296.20BB]         Class: Continuation         Date Noted: 10/11/2007            3/09: PHq9 = 12, encouraged to increase celexa to            20 mg daily.  No SI/HI.              Schedule appt with Taylore Grindstaff            6/09: PHQ9 = 15 today, pt stopped taking meds 2-3            wks ago.  Does not want to restart.  Also declines            counseling.            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  Has appt            scheduled with             nutritonist in [place]            11/11: LDL 140, elevated but not at level where            meds recommended, LDL goal             < 160, continue to discuss diet and exercise      FAMILY HX GI MALIGNANCY [V16.0]         Date Noted: 07/03/2004      ANEMIA NOS [285.9]         Date Noted: 06/18/2004            02/10/2006 due to fibroids most likely; had            hysterectomy            10/08 CBC wnl      URINARY CALCULUS NOS [592.9]         Date Noted: 06/18/2004        Past History:  No past medical history on file.        Past Surgical History    OB ANTEPARTUM CARE C DLVR&POSTPARTUM     Comment x3    TAH +-RMVL TUBE +-RMVL OVARY     Comment for fibroids, still has cervix         Hospitalizations: [facility], reason: Abdominal Pain Date: 01/2005       Family History    Cancer - Other Father     Comment: stomach    Heart Brother     Comment: died at 82 during valve replacement    Heart Brother     Comment: chestpain with neg w/u    Heart Mother     Comment: Died of MI age 40         Social History Main Topics   Smoking status: Former cigarette smoker quit 10 years ago    Smokeless tobacco:     Alcohol Use: Yes  10.0 oz/week     Comment: weekends    Drug Use: No    Sexually Active: Not Currently  Partner(s): Female    Comment: G3P3, no hx STD, no hx abn Pap      Allergy History:  Review of patient's allergies indicates no known allergies.      Current outpatient prescriptions:  naproxen (NAPROSYN) 500 MG tablet Take 1 tablet by mouth 2 (two) times daily with meals. For 10 days.  Then you can take twice a day as  needed for pain. Disp: 60 tablet Rfl: 5   fluoxetine (PROZAC) 20 MG capsule Take 1 capsule by mouth daily. Disp: 30 capsule Rfl: 6   OMEGA 3 1000 MG OR CAPS 1 taily Disp:  Rfl:  REVIEW OF SYSTEMS:  Skin: negative for rash and bruising  Eyes: negative for visual blurring, double vision and eye pain  Ears/Nose/Throat: negative for deafness, tinnitus, bleeding gums and ear pain  Respiratory: negative for cough and shortness of breath  Cardiovascular: negative for palpitations and chest pain  Gastrointestinal: negative for poor appetite, abdominal pain, melena, hematochezia, constipation and diarrhea  Genitourinary: negative for dysuria and hematuria  Musculoskeletal: negative for joint stiffness, muscle atrophy and joint swelling  Neurologic: numbness of hands x one month  Psychiatric: negative for sleep disturbance, illegal drug usage and suicidal ideation  Hematologic/Lymphatic/Immunologic: negative for fever, chills and weight loss  Endocrine: negative goiter, polydipsia and polyuria      PHYSICAL EXAMINATION:  Patient's last menstrual period was 07/11/2005.   BP 120/80  Pulse 94  Temp(Src) 97 F (36.1 C) (Temporal)  Ht 4' 11.5" (1.511 m)  Wt 143 lb (64.864 kg)  BMI 28.40 kg/m2  SpO2 100%  LMP 07/11/2005   General appearance: healthy, alert, well developed, well nourished  Skin: skin color, texture, turgor are normal  Head: Normocephalic. No masses, lesions, tenderness or abnormalities  Eyes: conjunctivae/corneas clear. PERRL, EOM's intact. Fundi benign  Ears: External ears normal. Canals clear. TM's normal.  Nose/Sinuses: Nares normal. Septum midline. Mucosa normal. No drainage or sinus tenderness.  Oropharynx: Lips, mucosa, and tongue normal. Teeth and gums normal. Oropharynx moist and without lesion  Neck: Neck supple. No adenopathy. Thyroid symmetric, normal size, and without nodularity  Back: Back symmetric, no curvature. ROM normal. No CVA tenderness.  Lungs: Good diaphragmatic excursion. Lungs  clear to auscultation bilaterally  Heart: PMI normal. No lifts, heaves, or thrills. RRR. No murmurs, clicks, gallops or rubs  Breasts: Inspection negative. No nipple discharge or bleeding. No masses or nodularity palpable  Abdomen: Abdomen soft, non-tender. BS normal. No masses, no organomegaly  Extremities: Extremities normal. No deformities, edema, or skin discoloration  Musculoskeletal: Spine ROM normal. Muscular strength intact.  Peripheral pulses: radial=4/4  Neuro: Gait normal. Reflexes normal and symmetric. Sensation grossly normal   Pelvic: deferred  EKG: normal EKG, normal sinus rhythm. Without ST abnormalities.      ASSESSMENT:  1) pre-op assessment    PLAN:  Patient is clear for surgery.    782.0D Numbness  Comment: ? Carpal tunnel; Per 07/20/11 note patient had negative phalen's, tinnels, and wrist pain. Last A1c in 2011 5.9%, unlikely peripheral neuropathy. I have discussed these findings with patient's PCP who suggests b/l hand splinting at night and while driving. Ordered RPR today to r/o possible syphilis. RPR is negative.  Plan: TREPONEMA PALLIDUM ANTIBODY (RPR)      07/28/2011 9:13 AM     _____________________________________  Rexene Agent PA-C   Pager: [phone]    Plastic Surgeon: Dr. Corliss Parish  Phone#: [phone]

## 2011-07-29 LAB — BASIC METABOLIC PANEL
ANION GAP: 8 mmol/L (ref 3–11)
BUN (UREA NITROGEN): 12 mg/dl (ref 6–20)
CALCIUM: 9.7 mg/dl (ref 8.6–10.3)
CARBON DIOXIDE: 25 mmol/L (ref 22–32)
CHLORIDE: 103 mmol/L (ref 101–111)
CREATININE: 0.5 mg/dl (ref 0.4–1.2)
ESTIMATED GLOMERULAR FILT RATE: >60 mL/min (ref >60)
Glucose Random: 97 mg/dl (ref 74–160)
POTASSIUM: 4.1 mmol/L (ref 3.5–5.1)
SODIUM: 136 mmol/L (ref 135–144)

## 2011-07-29 LAB — TREPONEMA PALLIDUM AB IGG: TREPONEMA PALLIDUM AB IgG: NONREACTIVE

## 2011-07-30 DIAGNOSIS — Z411 Encounter for cosmetic surgery: Secondary | ICD-10-CM

## 2011-07-30 HISTORY — DX: Encounter for cosmetic surgery: Z41.1

## 2011-08-13 LAB — EKG

## 2011-12-17 ENCOUNTER — Ambulatory Visit (HOSPITAL_BASED_OUTPATIENT_CLINIC_OR_DEPARTMENT_OTHER): Payer: PRIVATE HEALTH INSURANCE | Admitting: Family Medicine

## 2011-12-21 ENCOUNTER — Encounter (HOSPITAL_BASED_OUTPATIENT_CLINIC_OR_DEPARTMENT_OTHER): Payer: Self-pay | Admitting: Family Medicine

## 2011-12-21 ENCOUNTER — Ambulatory Visit (HOSPITAL_BASED_OUTPATIENT_CLINIC_OR_DEPARTMENT_OTHER): Payer: PRIVATE HEALTH INSURANCE | Admitting: Family Medicine

## 2011-12-21 VITALS — BP 140/98 | HR 96 | Temp 96.9°F | Ht 59.5 in | Wt 152.0 lb

## 2011-12-21 DIAGNOSIS — M79601 Pain in right arm: Principal | ICD-10-CM

## 2011-12-21 DIAGNOSIS — F329 Major depressive disorder, single episode, unspecified: Secondary | ICD-10-CM

## 2011-12-21 DIAGNOSIS — Z7189 Other specified counseling: Secondary | ICD-10-CM

## 2011-12-21 MED ORDER — ACETAMINOPHEN-CODEINE #3 300-30 MG PO TABS
1.00 | ORAL_TABLET | ORAL | Status: AC | PRN
Start: 2011-12-21 — End: 2012-01-20

## 2011-12-21 NOTE — Progress Notes (Signed)
S: 51 year old female with c/o R arm pain x2 weeks. 10/10 at worst. Constant, non-radiating, feels like bone pain. Has tried ibuprofen and unclear medication from [place] which helped initially, but not anymore. Worse with lifting arm. Has had shoulder pain and hand numbness on same side in the past.      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    Patient reports she is taking prozac and helps with mood somewhat. Current arm pain is affecting general depression. Denies SI/HI/AH/VH.    O: BP 140/98  Pulse 96  Temp(Src) 96.9 F (36.1 C) (Temporal)  Ht 4' 11.5" (1.511 m)  Wt 152 lb (68.947 kg)  BMI 30.19 kg/m2  SpO2 97%  LMP 07/11/2005  Pain Score: 10 (10/10)  Gen: NAD  MSK: R shoulder TTP biceps/mid upperarm, full active ROM, 5/5 strength throughout, N/V intact    A/P:  729.5BV Right arm pain  (primary encounter diagnosis)  Comment: Unclear etiology. Possible overuse, though not in shoulder  Plan: ORDER FOR GENERAL X-RAY, acetaminophen-codeine         (TYLENOL #3) 300-30 MG per tablet        Supportive care discussed. We discussed TyCo #3 and the importance of medication compliance. The patient was ready to learn and no apparent learning barriers were identified. I explained the diagnosis and treatment plan, and the patient expressed understanding of the content. Possible side effects of the prescribed medication(s) were explained.  I attempted to answer any questions regarding the diagnosis and the proposed treatment. Strict return precautions discussed. Pt. iterates understanding and all questions answered.       296.20BB Major depressive disorder  Comment: Stable  Plan: Brief care plan completed. Continue meds as scheduled. Anticipate improved PHQ-9 with improved pain control. Warned. Will follow.     V65.49AJ ACP (advance care planning)  Comment: Full code  Plan: HEALTH CARE PROXY

## 2011-12-22 ENCOUNTER — Encounter (HOSPITAL_BASED_OUTPATIENT_CLINIC_OR_DEPARTMENT_OTHER): Payer: Self-pay | Admitting: Student in an Organized Health Care Education/Training Program

## 2011-12-22 ENCOUNTER — Emergency Department (HOSPITAL_BASED_OUTPATIENT_CLINIC_OR_DEPARTMENT_OTHER)
Admission: RE | Admit: 2011-12-22 | Disposition: A | Discharge status: Home / Self Care | Payer: Self-pay | Source: Emergency Department | Attending: Student in an Organized Health Care Education/Training Program | Admitting: Student in an Organized Health Care Education/Training Program

## 2011-12-22 HISTORY — DX: Depression, unspecified: F32.A

## 2011-12-22 HISTORY — DX: Major depressive disorder, single episode, unspecified: F32.9

## 2011-12-22 LAB — POC URINALYSIS
BILIRUBIN, URINE: NEGATIVE
GLUCOSE,URINE: NEGATIVE
KETONE, URINE: NEGATIVE
LEUKOCYTE ESTERASE: NEGATIVE
NITRITE, URINE: NEGATIVE
OCCULT BLOOD, URINE: NEGATIVE
PH URINE: 5.5 (ref 5.0–8.0)
PROTEIN, URINE: NEGATIVE
SPECIFIC GRAVITY, URINE: 1.025 (ref 1.003–1.030)
UROBILINOGEN URINE: 0.2 (ref 0.2–1.0)

## 2011-12-22 MED ORDER — BUTALBITAL-APAP-CAFFEINE 50-325-40 MG PO TABS
2.00 | ORAL_TABLET | Freq: Once | ORAL | Status: AC
Start: 2011-12-22 — End: 2011-12-22
  Administered 2011-12-22: 2 via ORAL
  Filled 2011-12-22: qty 2

## 2011-12-22 MED ORDER — IBUPROFEN 600 MG PO TABS
600.00 mg | ORAL_TABLET | Freq: Once | ORAL | Status: AC
Start: 2011-12-22 — End: 2011-12-22
  Administered 2011-12-22: 600 mg via ORAL
  Filled 2011-12-22: qty 1

## 2011-12-22 MED ORDER — ONDANSETRON 4 MG PO TBDP
4.00 mg | ORAL_TABLET | Freq: Once | ORAL | Status: AC
Start: 2011-12-22 — End: 2011-12-22
  Administered 2011-12-22: 4 mg via ORAL
  Filled 2011-12-22: qty 1

## 2011-12-22 MED ORDER — BUTALBITAL-APAP-CAFFEINE 50-325-40 MG PO TABS
1.00 | ORAL_TABLET | ORAL | Status: AC | PRN
Start: 2011-12-22 — End: 2011-12-27

## 2011-12-22 NOTE — Discharge Instructions (Signed)
Take Fioricet as prescribed for headache.    Do not take Fioricet if taking Tylenol #3.    Followup with your primary care physician tomorrow regarding your headache and high blood pressure.    Your CAT scan of the head was normal.    If you develop severe worsening headache despite medication, persistent vomiting or other worrisome symptoms then return to the emergency department immediately.

## 2011-12-22 NOTE — ED Notes (Signed)
Pain level remains the same 8/10 after medication intervention - head CT ordered

## 2011-12-22 NOTE — ED Notes (Signed)
CT shows no abnormalities - Pt is ready to go home - gingerale and crackers given

## 2011-12-22 NOTE — ED Notes (Signed)
Patient Disposition    Patient education for diagnosis, medications, activity, diet and follow-up.  Patient left ED 11:05 PM.  Patient rep received written instructions.  Interpreter to provide instructions: No    Discharged to: Discharged to home

## 2011-12-22 NOTE — ED Provider Notes (Signed)
EMERGENCY DEPARTMENT ATTENDING NOTE        The patient was seen primarily by me. The Emergency Department nursing record was reviewed. Prior records as available through the electronic health record were reviewed.    Chief Complaint: Headache      History of Present Illness:    This 51 year old female patient presents with headache.  Patient presents with headache that started last night, states gradual onset, this morning improved after taking her Tylenol #3 she was prescribed for her right arm pain, states that she went to work today and the headache returned, took Tylenol #3 again at 12 PM.  States her headache later returned but worse than it had been prior, describes it as frontal, associated with nausea but denies any vomiting. Reports a "funny sensation" in her face, reports feeling "flushed and hot", denies numbness or difficulty speaking.  Denies neck pain, fever/chills.    Review of Systems: Pertinent positives were reviewed as per the HPI above. All other systems were reviewed and are negative.      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  Current outpatient prescriptions ordered prior to encounter:  acetaminophen-codeine (TYLENOL  #3) 300-30 MG per tablet Take 1-2 tablets by mouth every 4 (four) hours as needed for Pain. Disp: 45 tablet Rfl: 0   naproxen (NAPROSYN) 500 MG tablet Take 1 tablet by mouth 2 (two) times daily with meals. For 10 days.  Then you can take twice a day as needed for pain. Disp: 60 tablet Rfl: 5   fluoxetine (PROZAC) 20 MG capsule Take 1 capsule by mouth daily. 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   VITAL SIGNS:  BP 159/99  Pulse 80  Temp 98.6 F  Resp 16  Wt 68.04 kg  SpO2 98%  LMP 07/11/2005    HEAD AND NECK:  Normocephalic, atraumatic.  Pupils equally round and reactive to light.  Extraocular muscles intact.  Neck is supple.    CHEST:  Lungs are clear to auscultation bilaterally.    CARDIAC:  Heart sounds regular rate and rhythm.     ABDOMEN:  Soft, nontender.    EXTREMITIES:  Pulses are 2+ bilateral.  No edema.    SKIN:  Warm and dry.  No rash.    NEUROLOGIC:  CN 2-12 intact.  Strength 5/5 bilateral.  Sensation intact.  No dysmetria, normal speech.       ED Course and Medical Decision Making: 51 year old female presents with headache, was gradual onset and intermittent, resolved completely with Tylenol #3, not concerning for intracranial  hemorrhage as the patient has had periods of no symptoms and headache started gradually and worsened over the course of 12 hours.  Patient does have some associated nausea and photophobia, has hypertension here blood pressure 160/100 with normal neurologic examination.  Patient treated symptomatically with ibuprofen and Zofran.  She was reassessed and reported that her nausea was better but she still had a severe headache.  CT of the head ordered and normal, no evidence of any mass.  Patient was given 2 tabs of Fioricet and reported symptom relief.  Feels comfortable enough to go home, will provide prescription for Fioricet.  Patient instructed to followup with her primary care physician tomorrow regarding her headache and hypertension, at this time I will not start antihypertensive medicine.      Orders:     Orders Placed This Encounter  POC Urinalysis  Standing Status: Standing  Number of Occurrences: 1  ondansetron (ZOFRAN-ODT) disintegrating tablet 4 mg   Sig:   ibuprofen (ADVIL,MOTRIN) tablet 600 mg   Sig:       Results:  Results for orders placed during the hospital encounter of 12/22/11 (from the past 24 hour(s))   POC URINALYSIS    Collection Time    12/22/11  7:57 PM   Component Value Comment    COLOR YELLOW      CLARITY CLEAR      GLUCOSE,URINE NEGATIVE      BILIRUBIN, URINE NEGATIVE      KETONE, URINE NEGATIVE      SPECIFIC GRAVITY, URINE 1.025      UROBILINOGEN URINE 0.2      OCCULT BLOOD, URINE NEGATIVE      PH URINE 5.5      PROTEIN, URINE NEGATIVE      NITRITE, URINE NEGATIVE      LEUKOCYTE ESTERASE NEGATIVE               Patient was discharged from the Emergency Department in stable and improved condition.  The patient was given discharge instructions from [facility] and instructed to return to the Emergency Department for any new or worsening symptoms; otherwise, to follow up with the patients PCP.  Patient voiced understanding of the discharge  instructions and agreed with the Emergency  Department plan and disposition.  Patients questions were asked and answered prior to discharge.       PROVISIONARY DIAGNOSIS:  Headache  Hypertension      Critical care time outside of procedures:  0 minutes          Evalina Field, MD      Evalina Field, MD, 12/22/2011, 8:08 PM

## 2011-12-22 NOTE — ED Notes (Signed)
Back from CT

## 2011-12-22 NOTE — ED Notes (Signed)
Pt is feeling better - pain level 1/10-  Discharge instructions given with one prescription for fioricet

## 2011-12-22 NOTE — ED Triage Note (Signed)
Pt states that she has a very bad frontal head ache - started this am at 11 o'clock.  Pt reports that she didn't take anything for it - "cause I was afraid to".

## 2011-12-23 ENCOUNTER — Ambulatory Visit (HOSPITAL_BASED_OUTPATIENT_CLINIC_OR_DEPARTMENT_OTHER): Payer: PRIVATE HEALTH INSURANCE | Admitting: Family Medicine

## 2011-12-23 ENCOUNTER — Encounter (HOSPITAL_BASED_OUTPATIENT_CLINIC_OR_DEPARTMENT_OTHER): Payer: Self-pay | Admitting: Family Medicine

## 2011-12-23 VITALS — BP 132/82 | HR 92 | Temp 97.4°F | Wt 150.0 lb

## 2011-12-23 DIAGNOSIS — L989 Disorder of the skin and subcutaneous tissue, unspecified: Secondary | ICD-10-CM

## 2011-12-23 DIAGNOSIS — M79603 Pain in arm, unspecified: Principal | ICD-10-CM

## 2011-12-23 DIAGNOSIS — R030 Elevated blood-pressure reading, without diagnosis of hypertension: Secondary | ICD-10-CM

## 2011-12-23 LAB — CT HEAD WO CONTRAST

## 2011-12-23 NOTE — Patient Instructions (Signed)
1) Take you blood pressure 2-3 times per week for two weeks  2) start walking at least 30 mins twice a week  3) you may take Fioricet if you have headache as instructed  4) please book your physical therapy appointment for arm pain  5) Take naproxen twice a day as needed with food for arm pain; also message with bengay  6) return to see Dr. Margo Aye for the lesion on your back

## 2011-12-23 NOTE — Progress Notes (Signed)
SUBJECTIVE  [name] sis a 51 yo woman who presents for ED f/u. Present to ED with complaint of HA and BP 159/99.  UA neg;  Denies consumption of salty foods; denies arm weakness or numbness  No headache today;    Also reports atraumatic R arm pain x 2 weeks.  Seen in clinic 4/23 for this.  Pain unchanged and patient has not completed xray.  She denies radiation but has weakness.    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]     Elevated hemoglobin A1c [790.6AX]     Other plastic surgery for unacceptable cosmetic appearance [V50.1]      OBJECTIVE  BP 132/82  Pulse 92  Temp(Src) 97.4 F (36.3 C) (Temporal)  Wt 150 lb (68.04 kg)  SpO2 99%  LMP 07/11/2005  General: NAD, well-appearing, pleasant and cooperative  HEENT: atraumatic, normocephalic; PERRL, EOMI, fundi benign; oropharynx clear and without tonsillar exudates or hypertrophy; neck without tenderness or lymphadenopathy;   Skin: small nodule < 1 cm with darkened pore at upper back, no discharge or erythema  Lungs: clear to ascultation bilaterally, no wheezing or rales  Cardio: regular rate and rhythm; normal s1 and s2; no murmurs, rubs, or gallops;  Musculoskeletal: 3/5 SAR at R shoulder, TTP of upper bicep, FAROM; L UE normal  Abdomin: soft, NT/ND; no organomegally;  Ext: without edema or tenderness; cap refill < 2 sec UE b/l    A/P  729.5B Arm pain  (primary encounter diagnosis)  Comment: there is muscular pain on exam; reassured pt of exam findings; advised her to complete xray and if neg with commence PT given weakness;  Plan: REFERRAL TO PHYSICAL THERAPY ( INT)  1) naproxen 500 mg BID prn pain with food  2) warm compresses    796.2 Elevated blood pressure  reading without diagnosis of hypertension  Comment: BP elevated in ED; etiology unclear as BP previously controlled. 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

## 2011-12-29 ENCOUNTER — Ambulatory Visit: Payer: Self-pay | Admitting: Family Medicine

## 2011-12-30 LAB — XR HUMERUS RIGHT MINIMUM 2 VIEWS

## 2011-12-30 LAB — XR SHOULDER RIGHT MINIMUM 2 VIEWS

## 2012-01-05 ENCOUNTER — Ambulatory Visit (HOSPITAL_BASED_OUTPATIENT_CLINIC_OR_DEPARTMENT_OTHER): Payer: PRIVATE HEALTH INSURANCE

## 2012-01-05 ENCOUNTER — Encounter (HOSPITAL_BASED_OUTPATIENT_CLINIC_OR_DEPARTMENT_OTHER): Payer: Self-pay

## 2012-01-05 VITALS — BP 132/90 | HR 87 | Temp 98.0°F | Ht 59.5 in | Wt 151.3 lb

## 2012-01-05 DIAGNOSIS — R195 Other fecal abnormalities: Secondary | ICD-10-CM

## 2012-01-05 DIAGNOSIS — Z1211 Encounter for screening for malignant neoplasm of colon: Secondary | ICD-10-CM

## 2012-01-05 DIAGNOSIS — M79601 Pain in right arm: Principal | ICD-10-CM

## 2012-01-05 DIAGNOSIS — F329 Major depressive disorder, single episode, unspecified: Secondary | ICD-10-CM

## 2012-01-05 DIAGNOSIS — IMO0001 Reserved for inherently not codable concepts without codable children: Secondary | ICD-10-CM

## 2012-01-05 LAB — FECAL OCCULT (POINT OF CARE) HOME TEST 3 CARDS
LOT #: 2521
OCCULT BLOOD: NEGATIVE
OCCULT BLOOD: NEGATIVE
OCCULT BLOOD: POSITIVE — AB

## 2012-01-05 NOTE — Patient Instructions (Addendum)
Please schedule an appt with Dr Margo Aye in 2 weeks for f/u depression and arm pain    Patient self-management goals: I want to have more energy, stop crying    Medical team treatment goals: decrease depression sx, improve coping skills    Important care providers for this condition: MD Margo Aye, PA [place], RN [place]    Self-management tools given: handouts on depression and SSRI in [place]    Barriers: Need more health knowledge  Difficult to communicate in [place]  Health system is hard to understand  Not enough personal support from friends and family  Other family problems or responsibility    Brief Action Plan (developed with the patient):   Take my medicines every day; increase fluoxetine to 40 mg daily (2 pills)  Use stress management techniques  Return to clinic for an appointment with Dr Margo Aye in 2 weeks; I will bring in my medication with me    My confidence level that I am able to do this is: Fair    Referred to complex care management: No

## 2012-01-05 NOTE — Progress Notes (Signed)
S: Stephanie [name] is a 51 year old female who presents for the following:    > 5 months ago had silicone implants, no complications with surgery in [place].  Then last month developed pain in her right arm.  She was seen by Dr Steward Ros 4/23, then in ER 4/24, then by [facility] on 4/25 for this same complaint.  She notes that pain is worsening, in right arm, worse with ROM in all directions.  She denies pain in her shoulder, neck, elbow or wrist.  She denies numbness, tingling or weakness.  No clear preceding factor, no injury.  No h/o shoulder or arm injury.  She is taking motrin with temporary relief.    > She was also seen in the ER on 4/24 for HA and HTN.  She denies a h/o HTN.  Denies CP, SOB, palpitations, LE edema.  No further HA since being seen in the ER    > Depression: taking fluoxetine 20 mg that her daughter sends from [place].  Reports she feels better, more calm, since taking, although her PHQ9 = 16 today.  PHQ-9 FLOWSHEET 01/05/2012   Interest 1   Depressed 2   Sleep 3   Fatigue 3   Appetite 1   Self Esteem 2   Concentration 2   Psychomotor 2   Suicide 0   Total 16   Problem Mgmt Very Difficult     > she also brings in stool cards today.  She denies a h/o melena or hematochezia.    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]     Elevated hemoglobin A1c [790.6AX]     Other plastic surgery for unacceptable cosmetic appearance [V50.1]    Meds: medication list reviewed and updated today    Review of Patient's Allergies indicates:  No Known Allergies    OBJECTIVE:  BP 132/90  Pulse 87  Temp(Src) 98 F (36.7 C) (Temporal)  Ht 4' 11.5" (1.511 m)  Wt 151 lb 5 oz (68.635 kg)  BMI 30.05 kg/m2  SpO2 99%  LMP 07/11/2005  Most  Recent Weight Reading(s)  01/05/12 : 151 lb 5 oz (68.635 kg)  12/23/11 : 150 lb (68.04 kg)  12/22/11 : 150 lb (68.04 kg)  12/21/11 : 152 lb (68.947 kg)  07/28/11 : 143 lb (64.864 kg)  Gen: NAD  CV: RRR  Pulm: CTAB  Abd: NABS, soft, NT, ND  Ext: no edema  Right shoulder: no tenderness, edema or deformity; limited painful ROM in all directions.  Strength testing and maneuvers limited by pain.    Right arm: TTP body of biceps muscle  MSE: Well dressed.  Good eye contact.  Mood normal with full affect, occasionally tearful.  Speech and language are clear.  Thought process is normal.  Thought content is devoid of paranoia, suicidality or hallucinations.  Judgement and insight are fair.  Reviewed arm and shoulder xrays with pt showing mild degenerative changes, o/w normal    FOB cards: 1 positive, 2 negative    A/P:    > arm pain of unclear etiology, several clinic visits and ER visit for this same complaint with  no improvement or diagnosis.  Refer ortho and obtain MRI.  Rest, tylenol prn.  RTC 2 weeks for f/u.    > Elevated BP: discussed low-salt diet, exercise, nutrition.   Discussed d/c motrin for arm pain and start tylenol.    > Depression: taking fluoxetine 20 mg from [place] and one other med (pt is uncertain of the name).  I have requested that she RTC in 2 weeks and bring meds in with her.   She will increase fluoxetine to 40 mg daily (2 tabs).  Discussed SE profile, encourage to continue use for 6-8 weeks before assessing efficacy.  Advised pt that they may experience jitteriness, GI or other side effects that will usually resolve after 2 weeks.      Patient self-management goals: I want to have more energy, stop crying    Medical team treatment goals: decrease depression sx, improve coping skills    Important care providers for this condition: MD Margo Aye, PA [place], RN [employer]    Self-management tools given: handouts on depression and SSRI in [place]    Barriers: Need more health knowledge  Difficult to  communicate in [place]  Health system is hard to understand  Not enough personal support from friends and family  Other family problems or responsibility    Brief Action Plan (developed with the patient):   Take my medicines every day; increase fluoxetine to 40 mg daily (2 pills)  Use stress management techniques  Return to clinic for an appointment with Dr Margo Aye in 2 weeks; I will bring in my medication with me    My confidence level that I am able to do this is: Fair    Referred to complex care management: No      > Occult blood pos (1/3): refer for colonoscopy      We discussed the patients current medications. The patient expressed understanding and no barriers to adherence were identified.    We discussed the importance of medication compliance. The patient was ready to learn and no apparent learning barriers were identified. I explained the diagnosis and treatment plan, and the patient expressed understanding of the content. Possible side effects of the prescribed medication(s) were explained.  I attempted to answer any questions regarding the diagnosis and the proposed treatment.    I have spent 40 minutes in face to face time with this patient/patient proxy of which > 50% was in counseling or coordination of care regarding above issues/Dx.      Corena Herter, MD

## 2012-01-07 ENCOUNTER — Telehealth (HOSPITAL_BASED_OUTPATIENT_CLINIC_OR_DEPARTMENT_OTHER): Payer: Self-pay

## 2012-01-07 NOTE — Telephone Encounter (Signed)
Called patient to discuss scheduling a colonoscopy for occult positive stools. Unable to reach patient.Left a VM message with my contact information and call back # of [phone].

## 2012-01-11 ENCOUNTER — Other Ambulatory Visit (HOSPITAL_BASED_OUTPATIENT_CLINIC_OR_DEPARTMENT_OTHER): Payer: Self-pay

## 2012-01-11 DIAGNOSIS — Z1211 Encounter for screening for malignant neoplasm of colon: Principal | ICD-10-CM

## 2012-01-11 NOTE — Patient Instructions (Addendum)
Verbal instructions given over phone. Written instructions sent to patient in mail. Script sent to pharmacy.

## 2012-01-11 NOTE — Telephone Encounter (Signed)
Spoke with patient regarding scheduling a colonoscopy for positive fecal occult cards 1/3 and also a f.h. GI malignancy. Procedure/preparation/need for ride explained. Medical history reviewed with patient. Patient scheduled for a colonoscopy  With Dr Lajean Saver 01/20/12 at 0900

## 2012-01-12 LAB — MRI SHOULDER RIGHT WO CONTRAST

## 2012-01-12 MED ORDER — PEG 3350-KCL-NA BICARB-NACL 420 G PO SOLR
ORAL | Status: AC
Start: 2012-01-11 — End: 2012-03-11

## 2012-01-13 ENCOUNTER — Telehealth (HOSPITAL_BASED_OUTPATIENT_CLINIC_OR_DEPARTMENT_OTHER): Payer: Self-pay

## 2012-01-13 NOTE — Progress Notes (Signed)
Talked to patient about MRI results. Patient states understanding. She is concerned that it may be related to her breast implants as the surgery happened right before she noticed the pain in her shoulders. Discussed that the two things are unlikely related. Answered all questions and concerns at this time. Encouraged patient to call back if new questions arise.     Patient agrees to see specialist, but does say that she is going to an orthopedic appt on 5/23. For the specialist referral she can make any day work and says "I will do whatever, my health is very important to me."

## 2012-01-13 NOTE — Progress Notes (Signed)
Received shoulder MRI results showing:  Impression: Rotator cuff tears with hypertrophic degenerative changes  of acromioclavicular joint.  Pt needs to see a shoulder specialist to discuss treatment.  No answer; l/m for pt requesting call back.  Corena Herter, MD

## 2012-01-14 NOTE — Progress Notes (Signed)
Patient aware and will keep appointment.

## 2012-01-14 NOTE — Progress Notes (Signed)
Let's keep the appt for now with ortho for next week; do not need to schedule another appt at this time.  Please call pt to let her know.  Thanks, Corena Herter, MD

## 2012-01-17 ENCOUNTER — Ambulatory Visit (HOSPITAL_BASED_OUTPATIENT_CLINIC_OR_DEPARTMENT_OTHER): Payer: Self-pay

## 2012-01-19 ENCOUNTER — Ambulatory Visit (HOSPITAL_BASED_OUTPATIENT_CLINIC_OR_DEPARTMENT_OTHER): Payer: PRIVATE HEALTH INSURANCE

## 2012-01-19 VITALS — BP 138/80 | HR 89 | Temp 97.4°F | Ht 59.5 in | Wt 152.0 lb

## 2012-01-19 DIAGNOSIS — R195 Other fecal abnormalities: Secondary | ICD-10-CM

## 2012-01-19 DIAGNOSIS — L723 Sebaceous cyst: Principal | ICD-10-CM

## 2012-01-19 DIAGNOSIS — F329 Major depressive disorder, single episode, unspecified: Secondary | ICD-10-CM

## 2012-01-19 DIAGNOSIS — M75101 Unspecified rotator cuff tear or rupture of right shoulder, not specified as traumatic: Secondary | ICD-10-CM

## 2012-01-19 DIAGNOSIS — M12811 Other specific arthropathies, not elsewhere classified, right shoulder: Secondary | ICD-10-CM

## 2012-01-19 NOTE — Patient Instructions (Addendum)
Please schedule an appt with Dr Margo Aye in 10-12 days for suture removal / f/u depression                 Reviewed brief care plan; no changes            Patient self-management goals: I want to have more            energy, stop crying                        Medical team treatment goals: decrease depression            sx, improve coping             skills                        Important care providers for this condition: MD            Margo Aye, PA [place], RN             [place]                        Self-management tools given: handouts on            depression and SSRI in [place]                        Barriers: Need more health knowledge            Difficult to communicate in [place]            Health system is hard to understand            Not enough personal support from friends and            family            Other family problems or responsibility                        Brief Action Plan (developed with the patient):             Take my medicines every day; increase fluoxetine            to 40 mg daily (2 pills)            Use stress management techniques            Return to clinic for an appointment with Dr Margo Aye            in 2 weeks; I will bring             in my medication with me                        My confidence level that I am able to do this is:            Fair                        Referred to complex care management: No

## 2012-01-19 NOTE — Progress Notes (Signed)
S: Stephanie [name] is a 51 year old female who presents for the following:    > has noted a growing lesion on her back; sometimes there is a white pus-like discharge.  It waxes/wanes in size.  It is sometimes painful.    > depression: taking fluoxetine 20 mg daily.  At last visit, we had discussed increasing to 40 mg daily but she has not done this yet.  PHQ-9 FLOWSHEET 01/19/2012   Interest 0   Depressed 0   Sleep 2   Fatigue 1   Appetite 2   Self Esteem 0   Concentration 2   Psychomotor 2   Suicide 0   Total 9   Problem Mgmt Not difficult at all   She also brings in the name of another med she has taken in the past: carbolitium    > she is also here to f/u on her right shoulder pain and to discuss her MRI results.  Her pain is slightly improved, still very difficult to raise and IR her right arm.    > at last visit, she tested positive for FOBT cards; she is scheduled for a colonoscopy.  She denies melena, hematochezia, unintentional weight loss, abd pain.    > she is considering having her breast implants revised.  She is not happy with how they look.      OBJECTIVE:   Patient appears well.  BP 138/80  Pulse 89  Temp(Src) 97.4 F (36.3 C) (Temporal)  Ht 4' 11.5" (1.511 m)  Wt 152 lb (68.947 kg)  BMI 30.2 kg/m2  SpO2 98%  LMP 07/11/2005   Skin: sebaceous cyst on right upper back ~ 1 cm    Procedure Note:  Informed consent was obtained and documented including option of not performing surgery, technique of surgery, potential for scarring, risk of infection, and allergic reaction to anesthetic.    PATIENT/PROCEDURE VERIFICATION DOCUMENTATION    Correct patient: Yes  Correct procedure: Yes  Correct site, mark visible if applicable: Yes  Correct position: N/A  Special equipment/implant(s) present, if applicable: N/A    Time-out completed, documented by provider doing procedure or designated team member:  Corena Herter, MD     01/19/2012     5:47 PM     After site was prepped in sterile fashion, using Betadine for  cleansing and 1.0cc  1% Lidocaine with epinephrine for anesthetic, with sterile technique, elliptical excision in total was performed. (1.5 cm size wound)  3 superficial sutures placed 3-0 prolene   2 deep sutures placed 3-0  Steristrips were not applied  Wound was dressed with antibiotic ointment.  Wound care instructions were provided and the patient was instructed to be alert for any signs of cutaneous infection.    POST PAIN ASSESSMENT:  Post pain assessment done. Patient rates pain as a 0 on a 0-10 pain scale.    Review of MRI with pt:  Impression: Rotator cuff tears with hypertrophic degenerative changes  of acromioclavicular joint    A/P:    > Excision of cyst today, sebaceous vs inclusion cyst.  Follow up: the specimen is labeled and sent to pathology for evaluation, return for suture removal in 10-12 days.    >  Major depressive disorder         Priority: High [1]            01/21/2012: taking fluoxetine 20 mg from [place].  I again d/w pt increasing to 40 mg, which she is planning to do.  I advised her against taking lithium from [place]; discussed SE and reviewed that this is a medication for pts with bipolar, which pt does not have.    Reviewed SE profile, encourage to continue use for 6-8 weeks before assessing efficacy.  Advised pt that they may         experience jitteriness, GI or other side effects that will usually resolve after 2 weeks.              Reviewed brief care plan; no changes            Patient self-management goals: I want to have more            energy, stop crying                        Medical team treatment goals: decrease depression            sx, improve coping             skills                        Important care providers for this condition: MD            Margo Aye, PA [place], RN             [employer]                        Self-management tools given: handouts on            depression and SSRI in [place]                        Barriers: Need more health knowledge             Difficult to communicate in [place]            Health system is hard to understand            Not enough personal support from friends and            family            Other family problems or responsibility                        Brief Action Plan (developed with the patient):             Take my medicines every day; increase fluoxetine            to 40 mg daily (2 pills)            Use stress management techniques            Return to clinic for an appointment with Dr Margo Aye            in 2 weeks; I will bring             in my medication with me                        My confidence level that I am able to do this is:            Fair                        Referred to complex care management: No  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  Colonoscopy scheduled, but EGD has not been.      At next visit, suture removal, discuss path of cyst, discussion depression, need for EGD and rotator cuff tear  Discuss other [place] meds that she has questions about: pholioagra and glucomanan  We discussed depression and the importance of medication compliance. The patient was ready to learn and no apparent learning barriers were identified. I explained the diagnosis and treatment plan, and the patient expressed understanding of the content. Possible side effects of the prescribed medication(s) were explained, including jitteriness, GI upset.  I attempted to answer any questions regarding the diagnosis and the proposed treatment.  We discussed the patients current medications. The patient expressed understanding and no barriers to adherence were identified.    I have spent 25 minutes in face to face time with this patient/patient proxy of which > 50% was in counseling  or coordination of care regarding above issues/Dx.

## 2012-01-20 ENCOUNTER — Encounter (HOSPITAL_BASED_OUTPATIENT_CLINIC_OR_DEPARTMENT_OTHER): Payer: Self-pay | Admitting: Gastroenterology

## 2012-01-20 ENCOUNTER — Ambulatory Visit (HOSPITAL_BASED_OUTPATIENT_CLINIC_OR_DEPARTMENT_OTHER): Payer: PRIVATE HEALTH INSURANCE | Admitting: Orthopaedic Surgery

## 2012-01-20 ENCOUNTER — Ambulatory Visit (HOSPITAL_BASED_OUTPATIENT_CLINIC_OR_DEPARTMENT_OTHER)
Admit: 2012-01-20 | Disposition: A | Discharge status: Home / Self Care | Payer: Self-pay | Source: Ambulatory Visit | Attending: Gastroenterology | Admitting: Gastroenterology

## 2012-01-20 DIAGNOSIS — M19019 Primary osteoarthritis, unspecified shoulder: Secondary | ICD-10-CM

## 2012-01-20 DIAGNOSIS — M7541 Impingement syndrome of right shoulder: Secondary | ICD-10-CM

## 2012-01-20 DIAGNOSIS — M75101 Unspecified rotator cuff tear or rupture of right shoulder, not specified as traumatic: Principal | ICD-10-CM

## 2012-01-20 DIAGNOSIS — M12811 Other specific arthropathies, not elsewhere classified, right shoulder: Principal | ICD-10-CM

## 2012-01-20 LAB — GI OPERATIVE NOTE

## 2012-01-20 MED ORDER — MIDAZOLAM HCL 5 MG/5ML IJ SOLN
INTRAMUSCULAR | Status: AC
Start: 2012-01-20 — End: 2012-01-20
  Administered 2012-01-20: 5 mg via INTRAVENOUS
  Filled 2012-01-20: qty 10

## 2012-01-20 MED ORDER — SODIUM CHLORIDE 0.9 % IV SOLN
INTRAVENOUS | Status: DC
Start: 2012-01-20 — End: 2012-01-20
  Administered 2012-01-20: 09:00:00 via INTRAVENOUS

## 2012-01-20 MED ORDER — FENTANYL CITRATE 0.05 MG/ML IJ SOLN
INTRAMUSCULAR | Status: AC
Start: 2012-01-20 — End: 2012-01-20
  Administered 2012-01-20: 125 ug via INTRAVENOUS
  Filled 2012-01-20: qty 4

## 2012-01-20 MED ORDER — MIDAZOLAM HCL 5 MG/5ML IJ SOLN
0.5000 mg | INTRAMUSCULAR | Status: DC
Start: 2012-01-20 — End: 2012-01-20
  Administered 2012-01-20: 5 mg via INTRAVENOUS

## 2012-01-20 MED ORDER — DIPHENHYDRAMINE HCL 50 MG/ML IJ SOLN
25.0000 mg | Freq: Once | INTRAMUSCULAR | Status: DC | PRN
Start: 2012-01-20 — End: 2012-01-20

## 2012-01-20 MED ORDER — SIMETHICONE 40 MG/0.6ML PO SUSP
Freq: Once | ORAL | Status: DC
Start: 2012-01-20 — End: 2012-01-20

## 2012-01-20 MED ORDER — FENTANYL CITRATE 0.05 MG/ML IJ SOLN
25.0000 ug | INTRAMUSCULAR | Status: DC
Start: 2012-01-20 — End: 2012-01-20
  Administered 2012-01-20: 125 ug via INTRAVENOUS

## 2012-01-20 MED ORDER — SODIUM CHLORIDE 0.9 % IV SOLN
INTRAVENOUS | Status: DC
Start: 2012-01-20 — End: 2012-01-20
  Filled 2012-01-20: qty 500

## 2012-01-20 NOTE — Discharge Instructions (Signed)
[facility] DISCHARGE INSTRUCTIONS     Quando voc for para casa  possvel que se sinta sonolento(a). Descanse bastante pelo resto do dia.   When you return home you may feel sleepy. Get plenty of rest for the remainder of the day.     Se voc tiver recebido algum sedative para o procedimento NO DIRIJA, NO  MANUSEIE MQUINAS NEM TOME NENHUMA DECISO IMPORTANTE durante o resto do dia.  If you received sedation for your procedure DO NOT DRIVE, OPERATE MACHINERY, OR MAKE IMPORTANT DECISIONS for the remainder of the day.     Depois ter feito uma COLONOSCOPIA  normal ter gases e sentir inchao abdominal, mas se voc tiver uma DOR ABDOMINAL SEVERA entre em contato com o seu mdico imediatamente.   It is normal after having a COLONOSCOPY to feel a little gassy and bloated, but if you develop SEVERE ABDOMINAL PAIN call your doctor immediately.      Depois ter feito uma COLONOSCOPIA  normal ver uma pequena quantidade de sangue aps as primeiras evacuaes, mas se voc vir uma QUANTIDADE GRANDE DE SANGUE VERMELHO VIVO, entre em contato com o seu mdico imediatamente.  It is normal after having a COLONOSCOPY to see a small amount of blood after your first few bowel movements, but, if you see a LARGE AMOUNT OF BRIGHT RED BLOOD, call your doctor immediately.     Aps ter feito uma ENDOSCOPIA DIGESTIVA ALTA (GASTROENTEROSCOPIA)  normal se ter uma dor de garganta branda que pode durar por alguns dias, mas se voc tiver  DORES NO PEITO, FALTA DE AR, DIFICULDADE PARA ENGOLIR, VMITOS DE SANGUE VERMELHO VIVO OU SE NOTAR AS FEZES NEGRAS OU MARROM ESCURAS OU SE VOC SE SENTIR FRACO(A) OU CANSADO(A), entre em contato com o seu mdico imediatamente.    It is normal after having an UPPER ENDOSCOPY to develop a mild sore throat that will last for a few days, but, if you develop CHEST PAIN, SHORTNESS OF BREATH, DIFFICULTY SWALLOWING, VOMIT BRIGHT RED BLOOD, NOTICE YOUR BOWEL  MOVEMENTS ARE BLACK OR MAROON COLORED OR YOU FEEL WEAK AND TIRED, call your doctor immediately.     Se tiver feito uma bipsia ou Polipectomia voc ter que suspender a aspirina ou os medicamentos que contenham aspirina por {3} dias.                  If you had a biopsy or Polypectomy you will need to hold aspirin or medication containing aspirin for _3__days.     Se voc tiver feito uma bipsia ou Polipectomia voc ter que suspender o medicamento coumadin por {NUMBERS [id number] dias.                                           If you had a biopsy or Polypectomy you will need to hold your coumadin for___days.     Se voc tiver feito uma bipsia ou Polipectomia voc ter que suspender qualquer medicamento tais como Advil, Motrin, Naproxin, Ibuprofen etc por {3} dias.  If you had a biopsy or Polypectomy you will need to hold any medication such as Advil, Motrin, Naproxin, Ibuprofen etc for_3__days.     Entre em contato com o seu mdico se houver qualquer outro sintoma fora do normal.  Call you physician for any other unusual symptoms.     Se por qualquer razo  voc no conseguir entrar em contato com o seu mdico v para a sala de [employer] prxima.   If for any reason you are unable to reach your doctor go to the nearest Emergency Room.    Instrues Especficas:***  Specific Instructions: Diverticulosis. Polyps removed. Await pathology results. Keep follow up appointment. If the pathology report reveals adenomatous tissue, then repeat the colonoscopy for surveillance in 3 years.  Preoperatively reviewed by Admitting RN

## 2012-01-20 NOTE — H&P (Signed)
- SURGERY ADMISSION H&P NOTE -    Chief Complaint: This is a 51 y/o RHD female who complains of 2 months of atraumatic right shoulder pain. Shoulder pain increases with any attempt to lift her arm. She is unable to sleep on her right side due to pain. She reports no improvement with medication.    Shoulder Pain        Past Medical History:     Past Medical History    Depression        Past Surgical History:       Past Surgical History    OB ANTEPARTUM CARE CESAREAN DLVR & POSTPARTUM      Comment x3    TOTAL ABDOMINAL HYSTERECT W/WO RMVL TUBE OVARY      Comment for fibroids, still has cervix       Allergies:   Review of Patient's Allergies indicates:  No Known Allergies    Medications prior to Admission:     Current Outpatient Prescriptions on File Prior to Visit:  acetaminophen-codeine (TYLENOL #3) 300-30 MG per tablet Take 1-2 tablets by mouth every 4 (four) hours as needed for Pain. Disp: 45 tablet Rfl: 0   polyethylene glycol-electrolytes (NULYTELY WITH FLAVOR PACKS) 420 GM solution As directed Disp: 1 Bottle Rfl: 0   EXPIRED: naproxen (NAPROSYN) 500 MG tablet Take 1 tablet by mouth 2 (two) times daily with meals. For 10 days.  Then you can take twice a day as needed for pain. Disp: 60 tablet Rfl: 5   EXPIRED: fluoxetine (PROZAC) 20 MG capsule Take 1 capsule by mouth daily. Disp: 30 capsule Rfl: 6   OMEGA 3 1000 MG OR CAPS 1 taily Disp:  Rfl:      Current Facility-Administered Medications on File Prior to Visit:  sodium chloride 0.9% infusion  Intravenous Continuous Marrianne Mood Last Rate: 30 mL/hr at 01/20/12 0913   EXPIRED: fentanyl (SUBLIMAZE) injection 25-250 mcg 25-250 mcg Intravenous See Admin Instructions Marrianne Mood 125 mcg at 01/20/12 2956   EXPIRED: midazolam (VERSED) injection 0.5-15 mg 0.5-15 mg Intravenous See Admin Instructions Marrianne Mood 5 mg at 01/20/12 2130   diphenhydrAMINE (BENADRYL) injection 25 mg 25 mg Intravenous Once PRN Marrianne Mood    And        diphenhydrAMINE (BENADRYL) injection 25 mg  25 mg Intravenous Once PRN Marrianne Mood    simethicone (MYLICON) 80 mg in sterile water for irrigation 1,000 mL  Irrigation Once Marrianne Mood    sodium chloride 0.9% 0.9 % infusion            Tobacco Use:     Smoking status: Former Smoker   Types: Cigarettes   Quit date: 07/27/2001   Smokeless tobacco:       Alcohol:     Alcohol Use: Yes 10.0 oz/week   Comment: weekends       Family History:     Family History    Cancer - Other Father     Comment: stomach    Heart Brother     Comment: died at 62 during valve replacement    Heart Brother     Comment: chestpain with neg w/u    Heart Mother     Comment: Died of MI age 38       Review of Systems   Constitutional: Negative.    HENT: Negative.    Eyes: Negative.    Respiratory: Positive for shortness of breath.         With exertion   Cardiovascular:  Negative.    Gastrointestinal: Negative.    Endocrine: Negative.    Genitourinary: Negative.    Musculoskeletal:        Right shoulder   Skin: Negative.    Allergic/Immunologic: Negative.    Neurological: Negative.    Psychiatric/Behavioral: Negative.           Vital Signs - Last 24 Hours:   BP: (104-138)/(66-85)   Temp:  [97.4 F (36.3 C)-98.8 F (37.1 C)]   Pulse:  [68-89]   Resp:  [11-21]   SpO2:  [94 %-100 %]     Vital Signs - Last 8 Hours:   BP: (104-123)/(66-85)   Temp:  [98.6 F (37 C)-98.8 F (37.1 C)]   Pulse:  [68-84]   Resp:  [11-21]   SpO2:  [94 %-100 %]     Intake/Output last 24hours (7a-7a):   @IO24HRS @    Last 8 Hours:   @IOTHISSHIFT @     Physical Exam   Constitutional: She is oriented to person, place, and time. She appears well-developed and well-nourished.   HENT:   Head: Atraumatic.   Eyes: Pupils are equal, round, and reactive to light.   Neck: Neck supple.   Cardiovascular: Normal rate, regular rhythm and intact distal pulses.    Pulmonary/Chest: Effort normal and breath sounds normal.   Abdominal: Soft. Bowel sounds are normal.   Neurological: She is alert and oriented to person, place, and time.    Psychiatric: She has a normal mood and affect.   Musculoskeletal: right shoulder A/Prom: forward elevation 140/170, abduction 70/170, external rotation with the arm at the side is 80 degrees and internal rotation is to L5. +Palpable tenderness at the [facility] joint and bicipital groove. +cross body adduction test, +drop arm test.      DATA LAST 24 HOURS:     Recent labs:    No results found for this basename: WBC, HGB, HCT, PLTA, NEUT, LYMPH, MO, EOS, BASO,  in the last 24 hours    No results found for this basename: NA, K, CL, CO2, BUN, CREAT, GLUCOSER, CA, MG, PHOS, TBILI, DBILI, IBIL, AST, ALT, ALKPHOS, ALBUMIN,  in the last 24 hours        Component Value Date   UACOL YELLOW 12/22/2011   UACOL yellow 12/06/2007   UACLA CLEAR 12/22/2011   UACLA clear 12/06/2007   UAPRO NEGATIVE 12/22/2011   UAPRO 30* 12/06/2007   UAGLU NEGATIVE 12/22/2011   UAGLU neg 12/06/2007   UAKET NEGATIVE 12/22/2011   UAKET neg 12/06/2007   UAOCC NEGATIVE 12/22/2011   UAOCC trace 12/06/2007   UANIT NEGATIVE 12/22/2011   UANIT neg 12/06/2007   UABIL NEGATIVE 12/22/2011   UABIL neg 12/06/2007   UAWBC NONE SEEN 02/08/2005   UARBC MANY 10-50* 02/08/2005   UASQE 0-2 02/07/2005   UACRY NONE SEEN 02/08/2005   UABAC NONE SEEN 02/08/2005   UACAS NONE SEEN 02/08/2005       Imaging: MRI reveals a degenerative anterior rotator cuff tear, Type 3 acromion, and AC joint arthropathy.    Assessment/Plan: This is a 51 y/o female with right shoulder rotator cuff tear, impingement syndrome and AC joint arthropathy. She is scheduled for right shoulder arthroscopy, rotator cuff repair, mumford procedure, and partial acromioplasty with Dr. Tennis Must on 01/28/2012. Risks and benefits of the procedure were explained in detail and all the patient's questions were answered. The patient was seen and examined by Jolayne Haines, MD and he agrees with this plan.    @PROBHOSP @  Clydie Braun [facility], 01/20/2012, 3:31 PM       Pager [phone]

## 2012-01-20 NOTE — Progress Notes (Signed)
Estimated body mass index is 30.20 kg/(m^2) as calculated from the following:    Height as of 01/19/12: 4' 11.5" (1.511 m).    Weight as of 01/19/12: 152 lb (68.947 kg).

## 2012-01-20 NOTE — H&P (Signed)
H&P NOTE     Chief Complaint:   Patient presents with:    VERIFY CHIEF COMPLAINT - POSITIVE FECAL OCCULT CARDS/OAE      The history is provided by the patient. A language interpreter was used (portugese).          Review of Systems   Constitutional: Negative for fever, appetite change and fatigue.   HENT: Negative.    Respiratory: Negative.    Cardiovascular: Negative.    Gastrointestinal: Negative.    Neurological: Negative.           Patient Active Problem List:  Patient Active Problem List:     Unspecified Anemia     URINARY CALCULUS NOS     Overweight     Pure hypercholesterolemia     FAMILY HX GI MALIGNANCY     Leiomyoma of uterus, unspecified     Vitamin D deficiency     Major depressive disorder     Irritable Bladder     Elevated hemoglobin A1c     Other plastic surgery for unacceptable cosmetic appearance      Past Medical History:     Past Medical History    Depression        Past Surgical History:       Past Surgical History    OB ANTEPARTUM CARE CESAREAN DLVR & POSTPARTUM      Comment x3    TOTAL ABDOMINAL HYSTERECT W/WO RMVL TUBE OVARY      Comment for fibroids, still has cervix       Medications prior to Admission:     (Not in a hospital admission)    Allergies:   Review of Patient's Allergies indicates:  No Known Allergies    Social History:   Tobacco Use:   Smoking status: Former Smoker   Types: Cigarettes   Quit date: 07/27/2001   Smokeless tobacco:      Alcohol:   Alcohol Use: Yes 10.0 oz/week   Comment: weekends       Family History:   Family History    Cancer - Other Father     Comment: stomach    Heart Brother     Comment: died at 51 during valve replacement    Heart Brother     Comment: chestpain with neg w/u    Heart Mother     Comment: Died of MI age 10       Intake/Output last 24hours (7a-7a):        Vital Signs - Last 24 Hours:   BP: (110)/(85)   Temp:  [98.8 F (37.1 C)]   Pulse:  [71]   Resp:  [13]   SpO2:  [99 %]     Vital Signs - Last 8 Hours:   BP: (110)/(85)   Temp:  [98.8 F (37.1  C)]   Pulse:  [71]   Resp:  [13]   SpO2:  [99 %]     Physical Exam   Vitals reviewed.  Constitutional: She is oriented to person, place, and time. She appears well-developed and well-nourished. No distress.   HENT:   Head: Normocephalic and atraumatic.   Mouth/Throat: Oropharynx is clear and moist.   Eyes: Conjunctivae are normal.   Neck: Normal range of motion. Neck supple.   Cardiovascular: Normal rate and normal heart sounds.    No murmur heard.  Pulmonary/Chest: Effort normal and breath sounds normal.   Abdominal: Soft. Bowel sounds are normal. She exhibits no mass (No organomegaly).   Musculoskeletal: She  exhibits no edema.   Neurological: She is alert and oriented to person, place, and time.   Skin: Skin is warm and dry. She is not diaphoretic.              All data in last 24hours, labs only:  Recent labs:    Component Value Date   CA 9.7 07/28/2011   NA 136 07/28/2011   K 4.1 07/28/2011   CL 103 07/28/2011   CO2 25 07/28/2011   BUN 12 07/28/2011   CREAT 0.5 07/28/2011   GLUCOSER 97 07/28/2011       Component Value Date   WBC 7.0 07/28/2011   HGB 13.2 07/28/2011   HCT 40.4 07/28/2011   PLTA 299 07/28/2011   RBC 4.28 07/28/2011   ALBUMIN 3.8 04/22/2008     Proceed with procedure      Merilyn Baba, 01/20/2012, 9:12 AM

## 2012-01-21 DIAGNOSIS — M12811 Other specific arthropathies, not elsewhere classified, right shoulder: Principal | ICD-10-CM | POA: Insufficient documentation

## 2012-01-21 DIAGNOSIS — M7541 Impingement syndrome of right shoulder: Secondary | ICD-10-CM | POA: Insufficient documentation

## 2012-01-21 DIAGNOSIS — M19019 Primary osteoarthritis, unspecified shoulder: Secondary | ICD-10-CM | POA: Insufficient documentation

## 2012-01-21 DIAGNOSIS — M75101 Unspecified rotator cuff tear or rupture of right shoulder, not specified as traumatic: Secondary | ICD-10-CM | POA: Insufficient documentation

## 2012-01-21 DIAGNOSIS — R195 Other fecal abnormalities: Secondary | ICD-10-CM | POA: Insufficient documentation

## 2012-01-21 HISTORY — DX: Impingement syndrome of right shoulder: M75.41

## 2012-01-21 LAB — SURGICAL PATH SPECIMEN

## 2012-01-25 LAB — SURGICAL PATH SPECIMEN

## 2012-01-26 ENCOUNTER — Encounter (HOSPITAL_BASED_OUTPATIENT_CLINIC_OR_DEPARTMENT_OTHER): Payer: Self-pay

## 2012-01-26 DIAGNOSIS — D126 Benign neoplasm of colon, unspecified: Secondary | ICD-10-CM | POA: Insufficient documentation

## 2012-01-28 ENCOUNTER — Ambulatory Visit (HOSPITAL_BASED_OUTPATIENT_CLINIC_OR_DEPARTMENT_OTHER): Admit: 2012-01-28 | Payer: Self-pay | Source: Ambulatory Visit | Admitting: Orthopaedic Surgery

## 2012-02-02 ENCOUNTER — Encounter (HOSPITAL_BASED_OUTPATIENT_CLINIC_OR_DEPARTMENT_OTHER): Payer: Self-pay

## 2012-02-02 ENCOUNTER — Ambulatory Visit (HOSPITAL_BASED_OUTPATIENT_CLINIC_OR_DEPARTMENT_OTHER): Payer: PRIVATE HEALTH INSURANCE

## 2012-02-02 VITALS — BP 120/80 | HR 85 | Temp 98.4°F | Wt 152.0 lb

## 2012-02-02 DIAGNOSIS — K219 Gastro-esophageal reflux disease without esophagitis: Secondary | ICD-10-CM

## 2012-02-02 DIAGNOSIS — S43429A Sprain of unspecified rotator cuff capsule, initial encounter: Secondary | ICD-10-CM

## 2012-02-02 DIAGNOSIS — R195 Other fecal abnormalities: Principal | ICD-10-CM

## 2012-02-02 DIAGNOSIS — L720 Epidermal cyst: Secondary | ICD-10-CM

## 2012-02-02 DIAGNOSIS — F329 Major depressive disorder, single episode, unspecified: Secondary | ICD-10-CM

## 2012-02-02 DIAGNOSIS — D126 Benign neoplasm of colon, unspecified: Secondary | ICD-10-CM

## 2012-02-02 DIAGNOSIS — Z411 Encounter for cosmetic surgery: Secondary | ICD-10-CM

## 2012-02-02 MED ORDER — HYDROCODONE-ACETAMINOPHEN 5-325 MG PO TABS
ORAL_TABLET | ORAL | Status: AC
Start: 2012-02-02 — End: 2012-02-16

## 2012-02-02 NOTE — Progress Notes (Signed)
S: Stephanie [name] is a 51 year old female who presents for the following:    > here for suture removal for cyst removal on back    > depression: has increased fluoxetine to 40 mg (2 tabs) daily.    PHQ-9 FLOWSHEET 02/02/2012 01/19/2012   Interest 1 0   Depressed 0 0   Sleep 2 2   Fatigue 1 1   Appetite 1 2   Self Esteem 0 0   Concentration 1 2   Psychomotor 1 2   Suicide 0 0   Total 7 9   Problem Mgmt Somewhat difficult Not difficult at all     > since last visit, she has had a revision of her breast implants last week.  She would like to have me look at her steri-strips.    > she is planning on right shoulder surgery in August for rotator cuff tear    > she would also like her colonoscopy results today    OBJECTIVE:  BP 120/80  Pulse 85  Temp(Src) 98.4 F (36.9 C) (Temporal)  Wt 152 lb (68.947 kg)  BMI 28.33 kg/m2  SpO2 97%  LMP 07/11/2005  Gen: NAD  CV: RRR, no m/g/r  Pulm: CTAB  Breasts: steristrips replaced inferior to left breast, both breasts seem to be healing well with no signs of infection, but both are tender  Abd: NABS, soft, NT, ND  Ext: no edema  Upper right back: 3 interrupted sutures removed.  Skin edges well-approximated.      Reviewed colon polyp path showing:  **FINAL DIAGNOSIS**    SIGMOID COLON, 8 MM POLYP:  - TUBULAR ADENOMA.    TRANSVERSE COLON, 2 MM POLYP:  - TUBULAR ADENOMA.    Reviewed path of back excision:  SKIN, SEBACEOUS CYST, EXCISION:  - EPIDERMAL INCLUSION CYST.    A/P:    > Reviewed path of cyst on back showing epidermal inclusion cyst.  Reassured pt regarding this benign cyst    > Reviewed colon polyp path showing tubular adenoma.  Discussed need for repeat colonoscopy in 3 yrs.  Reviewed need for EGD given heme pos stool.   OAE referral done    > s/p breast implant revision, healing well.  Refill post-op pain meds    > Right shoulder rotator cuff tear: had ortho appt, planning for surgery in August    >  Major depressive disorder         Priority: High [1]           Has  increased fluoxetine to 40 mg daily, phq9 improved from 18 to 7.  Pt denies HI/SI.              Reviewed brief care plan; no changes            Patient self-management goals: I want to have more            energy, stop crying                        Medical team treatment goals: decrease depression            sx, improve coping             skills                        Important care providers for this condition: MD  Margo Aye, [place] Liberus, Sports coach                        Self-management tools given: handouts on            depression and SSRI in [place]                        Barriers: Need more health knowledge            Difficult to communicate in [place]            Health system is hard to understand            Not enough personal support from friends and            family            Other family problems or responsibility                        Brief Action Plan (developed with the patient):             Take my medicines every day; continue fluoxetine            40 mg daily (2 pills)            Use stress management techniques            Return to clinic for an appointment with Dr Margo Aye            in 4-6 weeks; I will bring             in my medication with me                        My confidence level that I am able to do this is:            Fair                        Referred to complex care management: No    We discussed depression and the importance of medication compliance. The patient was ready to learn and no apparent learning barriers were identified. I explained the diagnosis and treatment plan, and the patient expressed understanding of the content. Possible side effects of the prescribed medication(s) were explained, including jitteriness, GI upset.  I attempted to answer any questions regarding the diagnosis and the proposed treatment.  We discussed the patients current medications. The patient expressed understanding and no barriers to adherence were identified.    I have spent  40 minutes in face to face time with this patient/patient proxy of which > 50% was in counseling or coordination of care regarding above issues/Dx.        Corena Herter, MD

## 2012-02-04 ENCOUNTER — Telehealth (HOSPITAL_BASED_OUTPATIENT_CLINIC_OR_DEPARTMENT_OTHER): Payer: Self-pay

## 2012-02-04 NOTE — Progress Notes (Signed)
Received a referral form Dr Margo Aye to schedule patient for an Egd for Reflux symptoms, history of heme positive stools and a F.H. Of GI malignancy. She had a colonoscopy with Dr Lajean Saver in May but an Egd was not ordered at that time. Called patient to discuss scheduling an EGD. Spoke with patient about procedure, need to be NPO and need for a ride home. Medical history reviewed with patient .Patient scheduled with Dr Lajean Saver for 03/09/12 at 0940. Scheduled out into July as she reports she just had minor breast surgery on May 30th.

## 2012-02-04 NOTE — Patient Instructions (Signed)
Verbal instructions given over phone. Written instructions sent to patient in mail.

## 2012-02-14 NOTE — Patient Instructions (Signed)
Patient self-management goals: I want to have more            energy, stop crying                        Medical team treatment goals: decrease depression            sx, improve coping             skills                        Important care providers for this condition: MD            Margo Aye, PA Liberus, RN             [employer]                        Self-management tools given: handouts on            depression and SSRI in [place]                        Barriers: Need more health knowledge            Difficult to communicate in [place]            Health system is hard to understand            Not enough personal support from friends and            family            Other family problems or responsibility                        Brief Action Plan (developed with the patient):             Take my medicines every day; continue fluoxetine            40 mg daily (2 pills)            Use stress management techniques            Return to clinic for an appointment with Dr Margo Aye            in 4-6 weeks; I will bring             in my medication with me                        My confidence level that I am able to do this is:            Fair                        Referred to complex care management: No

## 2012-03-09 ENCOUNTER — Ambulatory Visit (HOSPITAL_BASED_OUTPATIENT_CLINIC_OR_DEPARTMENT_OTHER)
Admit: 2012-03-09 | Disposition: A | Discharge status: Home / Self Care | Payer: Self-pay | Source: Ambulatory Visit | Attending: Gastroenterology | Admitting: Gastroenterology

## 2012-03-09 ENCOUNTER — Encounter (HOSPITAL_BASED_OUTPATIENT_CLINIC_OR_DEPARTMENT_OTHER): Payer: Self-pay | Admitting: Gastroenterology

## 2012-03-09 LAB — GI OPERATIVE NOTE

## 2012-03-09 MED ORDER — FENTANYL CITRATE 0.05 MG/ML IJ SOLN
INTRAMUSCULAR | Status: DC
Start: 2012-03-09 — End: 2012-03-10
  Filled 2012-03-09: qty 4

## 2012-03-09 MED ORDER — DIPHENHYDRAMINE HCL 50 MG/ML IJ SOLN
25.0000 mg | Freq: Once | INTRAMUSCULAR | Status: DC | PRN
Start: 2012-03-09 — End: 2012-03-10

## 2012-03-09 MED ORDER — SODIUM CHLORIDE 0.9 % IV SOLN
INTRAVENOUS | Status: DC
Start: 2012-03-09 — End: 2012-03-10
  Filled 2012-03-09: qty 500

## 2012-03-09 MED ORDER — SIMETHICONE 40 MG/0.6ML PO SUSP
Freq: Once | ORAL | Status: DC
Start: 2012-03-09 — End: 2012-03-10

## 2012-03-09 MED ORDER — MIDAZOLAM HCL 5 MG/5ML IJ SOLN
INTRAMUSCULAR | Status: DC
Start: 2012-03-09 — End: 2012-03-10
  Filled 2012-03-09: qty 10

## 2012-03-09 MED ORDER — FENTANYL CITRATE 0.05 MG/ML IJ SOLN
25.0000 ug | INTRAMUSCULAR | Status: DC
Start: 2012-03-09 — End: 2012-03-10

## 2012-03-09 MED ORDER — PANTOPRAZOLE SODIUM 40 MG PO TBEC
40.0000 mg | DELAYED_RELEASE_TABLET | Freq: Every day | ORAL | Status: DC
Start: 2012-03-09 — End: 2012-03-10

## 2012-03-09 MED ORDER — SODIUM CHLORIDE 0.9 % IV SOLN
INTRAVENOUS | Status: DC
Start: 2012-03-09 — End: 2012-03-10

## 2012-03-09 MED ORDER — MIDAZOLAM HCL 5 MG/5ML IJ SOLN
0.5000 mg | INTRAMUSCULAR | Status: DC
Start: 2012-03-09 — End: 2012-03-10

## 2012-03-09 NOTE — Discharge Instructions (Signed)
Preoperatively reviewed by Admitting [facility] DISCHARGE INSTRUCTIONS     When you return home, you may feel sleepy. Get plenty of rest for the remainder of the day.     If you received sedation for your procedure DO NOT DRIVE, OPERATE MACHINERY OR MAKE IMPORTANT DECISIONS for the reminder of the day.       It is normal after having an UPPER ENDOSCOPY to develop a mild sore throat that will last a few days.     If you had a biopsy or Polypectomy you will need o hold aspirin or medications containing aspirin for  3 day/s.       If you had a biopsy or Polypectomy you will need to hold any medication such as Advil, Motrin, Naproxin, Ibuprofen etc. for days.      Call your doctor immediately if you develop:   -  Chest Pain   -  Shortness of breath   -  Difficulty swallowing   -  Vomit bright red blood   -  Notice your bowel movements are black or maroon colored   -  You feel weak and tired     Call your physician for any unusual symptoms.     If for any reason you are unable to reach your doctor go to the nearest         EMERGENCY ROOM.     SPECIFIC INSTRUCTIONS:  ***

## 2012-03-09 NOTE — H&P (Signed)
GI Pre-procedure History and Physical Short Form  Stephanie [name] is an 51 year old female.    Chief Complaint: She is being scheduled for Upper GI endoscopy    The history is provided by the patient. A language interpreter was used.           Active Problems:  Patient Active Problem List:     Unspecified Anemia     URINARY CALCULUS NOS     Overweight     Pure hypercholesterolemia     FAMILY HX GI MALIGNANCY     Leiomyoma of uterus, unspecified     Vitamin D deficiency     Major depressive disorder     Irritable Bladder     Elevated hemoglobin A1c     Other plastic surgery for unacceptable cosmetic appearance     Rotator cuff tear arthropathy of right shoulder     AC joint arthropathy     Impingement syndrome of right shoulder     Hematest positive stools     Tubular adenoma of colon      History (Medical, Surgical, Social, Family):    Past Medical History    Depression          Past Surgical History    OB ANTEPARTUM CARE CESAREAN DLVR & POSTPARTUM      Comment x3    TOTAL ABDOMINAL HYSTERECT W/WO RMVL TUBE OVARY      Comment for fibroids, still has cervix    BREAST ENHANCEMENT SURGERY      Comment 12/12 had bilateral mammoplasty and saline implants, had 2nd surgery 5/30 for revision       Social History   Marital Status: Divorced  Spouse Name: N/A    Years of Education: N/A  Number of Children: N/A     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; 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     Social History Main Topics   Smoking status: Former Smoker     Types: Cigarettes    Quit date: 07/27/2001    Smokeless tobacco:     Alcohol Use: Yes  10.0 oz/week     Comment: weekends    Drug Use: No    Sexually Active: Not Currently  Partner(s): Female    Comment: G3P3, no hx STD, no hx abn Pap     Other Topics Concern   None on file     Social History Narrative   None on file       Family History    Cancer - Other Father     Comment: stomach    Heart Brother     Comment: died at 61 during valve replacement    Heart Brother     Comment: chestpain with neg  w/u    Heart Mother     Comment: Died of MI age 2       Allergies:   Review of Patient's Allergies indicates:  No Known Allergies    Medications:     Current Outpatient Prescriptions on File Prior to Encounter:  fluoxetine (PROZAC) 20 MG tablet Take 2 tablets by mouth daily. Disp: 1 tablet Rfl:    polyethylene glycol-electrolytes (NULYTELY WITH FLAVOR PACKS) 420 GM solution As directed Disp: 1 Bottle Rfl: 0   EXPIRED: naproxen (NAPROSYN) 500 MG tablet Take 1 tablet by mouth 2 (two) times daily with meals. For 10 days.  Then you can take twice a day as needed for pain. Disp: 60 tablet Rfl: 5   OMEGA 3 1000 MG OR  CAPS 1 taily Disp:  Rfl:      No current facility-administered medications on file prior to encounter.    Vitals:  BP 126/83  Pulse 69  Temp(Src) 98 F (36.7 C)  Resp 21  Ht 5\' 1"  (1.549 m)  Wt 150 lb (68.04 kg)  BMI 28.36 kg/m2  LMP 07/11/2005    Review of Systems   Constitutional: Negative for fever, appetite change and fatigue.   HENT: Negative.    Respiratory: Negative.    Cardiovascular: Negative.    Gastrointestinal: Negative.    Neurological: Negative.           Physical Exam   Constitutional: She is oriented to person, place, and time. She appears well-developed and well-nourished. No distress.   HENT:   Head: Normocephalic and atraumatic.   Mouth/Throat: Oropharynx is clear and moist.   Eyes: Conjunctivae are normal.   Neck: Normal range of motion. Neck supple.   Cardiovascular: Normal rate and normal heart sounds.    No murmur heard.  Pulmonary/Chest: Effort normal and breath sounds normal.   Abdominal: Soft. Bowel sounds are normal. She exhibits no mass (No organomegaly).   Musculoskeletal: She exhibits no edema.   Neurological: She is alert and oriented to person, place, and time.   Skin: Skin is warm and dry. She is not diaphoretic.          Airway Evaluation:  Gag reflex intact: Yes  Ability to open mouth wide:   Full  Dentures:  No  Loose teeth:  No  Neck range of motion   Full    Mallampati Airway Classification: Class I     A soft palate, fauces, uvula, anterior and posterior tonsil pillars are seen.  Mallampati Airway Classification:     ASA Classification: ASA Class I (a normally healthy patient)    Assessment:  Proceed with procedure

## 2012-03-13 LAB — SURGICAL PATH SPECIMEN

## 2012-03-14 ENCOUNTER — Encounter (HOSPITAL_BASED_OUTPATIENT_CLINIC_OR_DEPARTMENT_OTHER): Payer: Self-pay

## 2012-03-14 ENCOUNTER — Ambulatory Visit (HOSPITAL_BASED_OUTPATIENT_CLINIC_OR_DEPARTMENT_OTHER): Payer: PRIVATE HEALTH INSURANCE | Admitting: Gastroenterology

## 2012-03-14 VITALS — BP 121/96 | HR 73 | Temp 98.6°F | Wt 151.0 lb

## 2012-03-14 DIAGNOSIS — D126 Benign neoplasm of colon, unspecified: Secondary | ICD-10-CM

## 2012-03-14 DIAGNOSIS — K2100 Gastro-esophageal reflux disease with esophagitis, without bleeding: Secondary | ICD-10-CM

## 2012-03-14 MED ORDER — OMEPRAZOLE 40 MG PO CPDR
40.0000 mg | DELAYED_RELEASE_CAPSULE | Freq: Every day | ORAL | Status: DC
Start: 2012-03-14 — End: 2012-05-08

## 2012-03-14 MED ORDER — HYDROCORTISONE 2.5 % PR CREA
TOPICAL_CREAM | Freq: Two times a day (BID) | RECTAL | Status: AC
Start: 2012-03-14 — End: 2012-06-12

## 2012-03-14 NOTE — Progress Notes (Signed)
Date of Service: 03/14/2012    Ms Falkenhagen was seen in followup after she had a colonoscopy for screening purposes and an upper endoscopy for heartburn.    The patient was seen in the presence of a Portuguese-speaking interpreter.    The patient has a very rich family history of GI cancer, including the stomach and colon.    The colonoscopy showed diverticulosis, an 8-mm polyp in the sigmoid colon, and a 2-mm polyp in the transverse colon.  The pathology of the polyps was adenomatous.    The upper endoscopy showed esophageal erosions but no other abnormality.  Biopsy of the distal esophagus showed chronic esophagitis.    The patient has been complaining of postprandial abdominal discomfort and heartburn.    When questioned about the different things that could promote reflux, she admits to drinking alcohol, especially on weekends, up to 6 beers a night.  She denies smoking and denies consuming chocolate or mint.  She has been gaining weight recently.    Blood pressure 121/96, pulse 73, temperature 98.6, oxygen saturation 97%, and weight 151 pounds.    The patient was informed about the findings and was educated with respect to the antireflux measures that she should follow, both dietary and postural.  Also, a prescription for Prilosec was sent to the pharmacy.    As far as the colonoscopy is concerned, she was told that she needs to have a followup colonoscopy in 3 years' time.  I also discussed with her the finding of diverticulosis and the possible complications, such as diverticulitis or diverticular bleed.  I told her to increase her fiber intake and to supplement this with Citrucel and Metamucil.    Towards the end of the visit, the patient complained of having rectal burning, particularly after the passage of a bowel movement.  She was told to perform Sitz baths and to apply Anusol-HC cream.  A prescription for Anusol-HC cream was sent to the pharmacy.    The visit lasted 25 minutes, mostly spent in translation  and counseling.    ___________________________  Reviewed and Electronically Signed By: Marrianne Mood MD  Sig Date: 03/20/2012  Sig Time: 14:54:40  Dictated By: Marrianne Mood MD  Dict Date: 03/14/2012 Dict Time: 10 24 AM    Dictation Date and Time:03/14/2012 10:24:34  Transcription Date and Time:03/14/2012 12:46:03  eScription Dictation id: [id number] Confirmation # :[id number]      cc: Corena Herter MD    DICTATED BY: Marrianne Mood MDKarim [facility] MDD:03/14/2012 10:24:34 T:03/14/2012 12:46:03 1N Job#: [id number]

## 2012-03-14 NOTE — Progress Notes (Signed)
This office note has been dictated. Account number [id number]

## 2012-03-20 ENCOUNTER — Encounter (HOSPITAL_BASED_OUTPATIENT_CLINIC_OR_DEPARTMENT_OTHER): Payer: Self-pay

## 2012-03-20 DIAGNOSIS — K5730 Diverticulosis of large intestine without perforation or abscess without bleeding: Secondary | ICD-10-CM | POA: Insufficient documentation

## 2012-03-20 HISTORY — DX: Diverticulosis of large intestine without perforation or abscess without bleeding: K57.30

## 2012-03-21 ENCOUNTER — Ambulatory Visit (HOSPITAL_BASED_OUTPATIENT_CLINIC_OR_DEPARTMENT_OTHER): Payer: Self-pay | Admitting: Gastroenterology

## 2012-05-07 ENCOUNTER — Encounter (HOSPITAL_BASED_OUTPATIENT_CLINIC_OR_DEPARTMENT_OTHER): Payer: Self-pay

## 2012-05-07 ENCOUNTER — Other Ambulatory Visit (HOSPITAL_BASED_OUTPATIENT_CLINIC_OR_DEPARTMENT_OTHER): Payer: Self-pay

## 2012-05-07 DIAGNOSIS — R7309 Other abnormal glucose: Secondary | ICD-10-CM

## 2012-05-07 DIAGNOSIS — E7800 Pure hypercholesterolemia, unspecified: Principal | ICD-10-CM

## 2012-05-08 ENCOUNTER — Ambulatory Visit (HOSPITAL_BASED_OUTPATIENT_CLINIC_OR_DEPARTMENT_OTHER): Payer: PRIVATE HEALTH INSURANCE

## 2012-05-08 VITALS — BP 122/82 | HR 96 | Temp 97.4°F | Wt 159.2 lb

## 2012-05-08 DIAGNOSIS — E7800 Pure hypercholesterolemia, unspecified: Secondary | ICD-10-CM

## 2012-05-08 DIAGNOSIS — R7309 Other abnormal glucose: Secondary | ICD-10-CM

## 2012-05-08 DIAGNOSIS — R059 Cough, unspecified: Secondary | ICD-10-CM

## 2012-05-08 DIAGNOSIS — K219 Gastro-esophageal reflux disease without esophagitis: Secondary | ICD-10-CM

## 2012-05-08 DIAGNOSIS — R05 Cough: Secondary | ICD-10-CM

## 2012-05-08 DIAGNOSIS — F329 Major depressive disorder, single episode, unspecified: Principal | ICD-10-CM

## 2012-05-08 DIAGNOSIS — K649 Unspecified hemorrhoids: Secondary | ICD-10-CM

## 2012-05-08 LAB — ALANINE AMINOTRANSFERASE: ALANINE AMINOTRANSFERASE: 42 IU/L — ABNORMAL HIGH (ref 7–35)

## 2012-05-08 LAB — CHG LIPOPROTEIN DIRECT MEASUREMENT LDL CHOLESTEROL: LOW DENSITY LIPOPROTEIN DIRECT: 149 mg/dl — ABNORMAL HIGH (ref 0–100)

## 2012-05-08 LAB — CHOLESTEROL: Cholesterol: 261 mg/dl — ABNORMAL HIGH (ref 0–200)

## 2012-05-08 LAB — CHG LIPOPROTEIN DIR MEAS HIGH DENSITY CHOLESTEROL: HIGH DENSITY LIPOPROTEIN: 59 mg/dl (ref 35–85)

## 2012-05-08 MED ORDER — FLUOXETINE HCL 40 MG PO CAPS
40.0000 mg | ORAL_CAPSULE | Freq: Every day | ORAL | Status: DC
Start: 2012-05-08 — End: 2013-05-17

## 2012-05-08 MED ORDER — HYDROCORTISONE ACETATE 25 MG PR SUPP
RECTAL | Status: AC
Start: 2012-05-08 — End: 2012-08-07

## 2012-05-08 MED ORDER — HYDROCORTISONE 2.5 % PR CREA
TOPICAL_CREAM | RECTAL | Status: AC
Start: 2012-05-08 — End: 2012-08-07

## 2012-05-08 MED ORDER — OMEPRAZOLE 40 MG PO CPDR
40.0000 mg | DELAYED_RELEASE_CAPSULE | Freq: Every day | ORAL | Status: DC
Start: 2012-05-08 — End: 2013-05-17

## 2012-05-08 NOTE — Progress Notes (Signed)
S: Stephanie [name] is a 51 year old female who presents for the following:    > ran out of prozac x 20 days, feeling more anxious, not sleeping well, increased appetite.  phq9 = 4 today, GAD7 not done.    > also requesting omeprazole refill    > she also notes rectal itching.  Dr Lajean Saver prescribed anusol cream but pt never received.    > she also notes dry cough x 1 month.  Denies fever, throat pain/itching, nasal congestion.    OBJECTIVE:  BP 122/102  Pulse 96  Temp(Src) 97.4 F (36.3 C) (Temporal)  Wt 159 lb 3.2 oz (72.213 kg)  BMI 30.1 kg/m2  SpO2 98%  LMP 07/11/2005  Gen: NAD  CV: RRR, no m/g/r  Pulm: CTAB  Abd: NABS, soft, NT, ND  Ext: no edema    A/P:  Also see problem list updated today    (296.20) Major depressive disorder  (primary encounter diagnosis)  Plan: restart fluoxetine (PROZAC) 40 MG capsule, reviewed SE profile, encourage to continue use for 6-8 weeks before assessing efficacy.  Advised pt that they may experience jitteriness, GI or other side effects that will usually resolve after 2 weeks.    If sx of depression and anxiety do not improve, call/RTC     (272.0) Pure hypercholesterolemia  Plan: CHOLESTEROL, HIGH DENSITY LIPOPROTEIN, LOW         DENSITY LIPOPROTEIN,DIRECT, COLLECTION VENOUS         BLOOD VENIPUNCTURE, ALANINE AMINO TRANSFERASE         (ALT)    (790.6) Elevated hemoglobin A1c  Plan: COLLECTION VENOUS BLOOD VENIPUNCTURE,         HEMOGLOBIN A1C    (786.2) Cough  Comment: pt reassured, no red flags on exam or history, RTC if sx worsen or do not resolve    (530.81) Esophageal reflux  Comment: omeprazole recommended by GI after endoscopy  Plan: omeprazole (PRILOSEC) 40 MG capsule    (455.6) Unspecified hemorrhoids without mention of complication  Plan: hydrocortisone (ANUSOL-HC) 25 MG suppository,         hydrocortisone (PROCTOSOL HC) 2.5 % rectal         cream  Discussed increasing dietary fiber, water.  RTC for symptoms that worsen or do not improve.      Corena Herter, MD

## 2012-05-09 ENCOUNTER — Encounter (HOSPITAL_BASED_OUTPATIENT_CLINIC_OR_DEPARTMENT_OTHER): Payer: Self-pay

## 2012-05-09 LAB — HEMOGLOBIN A1C
ESTIMATED AVERAGE GLUCOSE: 126 (ref 74–160)
HEMOGLOBIN A1C: 6 % — ABNORMAL HIGH (ref 4.0–5.6)

## 2012-06-05 ENCOUNTER — Ambulatory Visit (HOSPITAL_BASED_OUTPATIENT_CLINIC_OR_DEPARTMENT_OTHER): Payer: PRIVATE HEALTH INSURANCE | Admitting: Registered Nurse

## 2012-06-05 ENCOUNTER — Ambulatory Visit (HOSPITAL_BASED_OUTPATIENT_CLINIC_OR_DEPARTMENT_OTHER): Payer: Self-pay

## 2012-06-05 ENCOUNTER — Ambulatory Visit (HOSPITAL_BASED_OUTPATIENT_CLINIC_OR_DEPARTMENT_OTHER): Payer: PRIVATE HEALTH INSURANCE

## 2012-06-05 VITALS — BP 130/80 | HR 90 | Temp 97.5°F | Ht 61.0 in | Wt 160.8 lb

## 2012-06-05 DIAGNOSIS — R7309 Other abnormal glucose: Secondary | ICD-10-CM

## 2012-06-05 DIAGNOSIS — E7800 Pure hypercholesterolemia, unspecified: Secondary | ICD-10-CM

## 2012-06-05 DIAGNOSIS — F329 Major depressive disorder, single episode, unspecified: Secondary | ICD-10-CM

## 2012-06-05 DIAGNOSIS — K649 Unspecified hemorrhoids: Secondary | ICD-10-CM

## 2012-06-05 DIAGNOSIS — R059 Cough, unspecified: Secondary | ICD-10-CM

## 2012-06-05 DIAGNOSIS — R05 Cough: Principal | ICD-10-CM

## 2012-06-05 LAB — XR CHEST 2 VIEWS

## 2012-06-05 MED ORDER — GUAIFENESIN-CODEINE 100-10 MG/5ML PO SOLN
ORAL | Status: AC
Start: 2012-06-05 — End: 2012-06-12

## 2012-06-05 MED ORDER — AZITHROMYCIN 250 MG PO TABS
ORAL_TABLET | ORAL | Status: AC
Start: 2012-06-05 — End: 2012-06-10

## 2012-06-05 NOTE — Progress Notes (Signed)
S: Stephanie [name] is a 51 year old female who presents for the following:    > here to review cholesterol and diabetes screening test results    > did not start prozac at last visit.  Went to [facility] to pick up but was told insurance was not active    > was also unable to pick up omeprazole Rx due to insurance issue, but has been taking omeprazole from [place]    > she also notes rectal itching.  Pt has not yet picked up this Rx    > she continues to have dry cough x 2 mos.  Denies fever, throat pain/itching, nasal congestion.  No improvement with omeprazole that she is taking form [place]    Patient Active Problem List:     Unspecified Anemia     URINARY CALCULUS NOS     Overweight     Pure hypercholesterolemia     FAMILY HX GI MALIGNANCY     Leiomyoma of uterus, unspecified     Vitamin D deficiency     Major depressive disorder     Irritable Bladder     Elevated hemoglobin A1c     Other plastic surgery for unacceptable cosmetic appearance     Rotator cuff tear arthropathy of right shoulder     AC joint arthropathy     Impingement syndrome of right shoulder     Hematest positive stools     Tubular adenoma of colon     Diverticulosis of colon    Meds: medication list reviewed and updated today    Review of Patient's Allergies indicates:  No Known Allergies    OBJECTIVE:  BP 130/80  Pulse 90  Temp(Src) 97.5 F (36.4 C) (Temporal)  Ht 5\' 1"  (1.549 m)  Wt 160 lb 12.8 oz (72.938 kg)  BMI 30.4 kg/m2  SpO2 97%  LMP 07/11/2005  Gen: NAD  CV: RRR, no m/g/r  Pulm: CTAB  Abd: NABS, soft, NT, ND  Ext: no edema  Reviewed recent labs with patient:  Component      Latest Ref Rng 05/08/2012   HEMOGLOBIN A1C      4.0 - 5.6 % 6.0 (H)   ESTIMATED AVERAGE GLUCOSE      74 - 160 126   Cholesterol      0 - 200 mg/dl 161 (H)   HDL      35 - 85 mg/dl 59   LDL DIRECT      0 - 100 mg/dl 096 (H)   ALANINE AMINOTRANSFERASE (ALT)      7 - 35 IU/L 42 (H)     A/P:    Called [facility] to ensure active insurance    Chronic cough x 2 mos:  may be related to GER, but no improvement with omeprazole that pt is taking from [place].  Given timecourse and unclear etiology, check CXR and trial of zithromax.  Rx cheratussin, use with caution.    (296.20) Major depressive disorder  (primary encounter diagnosis)  Plan: restart fluoxetine (PROZAC) 40 MG capsule, reviewed SE profile, encourage to continue use for 6-8 weeks before assessing efficacy.  Advised pt that they may experience jitteriness, GI or other side effects that will usually resolve after 2 weeks.    If sx of depression and anxiety do not improve, call/RTC     (455.6) Unspecified hemorrhoids without mention of complication  Plan: hydrocortisone (ANUSOL-HC) 25 MG suppository,         hydrocortisone (PROCTOSOL HC) 2.5 %  rectal         cream  Discussed increasing dietary fiber, water.  RTC for symptoms that worsen or do not improve.              Elevated hemoglobin A1c         A1C = 6, again discuss nutrition and            exercise      Pure hypercholesterolemia        LDL 145, discussed diet and exercise    Corena Herter, MD

## 2012-06-05 NOTE — Patient Instructions (Signed)
Dieta para controle de níveis de gordura e colesterol   (Fat and Cholesterol Control Diet)  Os níveis de colesterol em seu corpo são determinados significativamente por sua dieta. Os níveis de colesterol também podem estar relacionados à doença cardíaca. Os seguintes materiais ajudam a explicar essa relação e discutem o que você pode fazer para ajudar a manter seu coração saudável. Nem todo o colesterol é ruim. O colesterol de lipoproteína de baixa densidade (LDL) é o colesterol "ruim". Ele pode causar depósitos de gordura em suas artérias. O colesterol de lipoproteína de alta densidade (HDL) é o colesterol "bom". Ele ajuda a remover o colesterol LDL "ruim" de seu sangue. O colesterol é um fator de risco muito importante para doença cardíaca. Outros fatores de risco são pressão sanguínea alta, fumo, estresse, hereditariedade e sobrepeso.   O músculo cardíaco obtém seu suprimento de sangue através de artérias coronárias. Se seu colesterol LDL ("ruim") é alto e seu colesterol HDL ("bom") é baixo, você tem um fator de risco de depósitos de gordura se acumularem em suas artérias coronárias (um vaso que fornece sangue ao coração). Isso deixa menos espaço pelo qual o sangue flui. Sem sangue e oxigênio suficientes, o músculo cardíaco não pode funcionar apropriadamente e você pode sentir dores no peito (angina pectoris). Quando uma artéria coronária se fecha inteiramente, uma parte do músculo pode morrer (infarto do miocárdio).   VERIFICANDO O COLESTEROL   Quando seu médico envia seu sangue a um laboratório para ser analisado quanto ao colesterol, eles podem fazer um perfil lipídico (de gordura). Com este exame, a quantidade total de colesterol, bem como os níveis de LDL e HDL, são determinados. Os triglicérides são um tipo de gordura que circula no sangue. Eles também podem ser usados para se determinar o risco de doenças cardíacas. A tabela abaixo descreve quais números devem ser:   Exame: Colesterol Total   · Menor que  200 mg/dl.  Exame: LDL "colesterol ruim"  · Menor que 100 mg/dl.  · Menos de 70 mg/dl caso você esteja no grupo de alto risco de ataque cardíaco ou morte cardíaca súbita.  Exame: HDL "colesterol bom"  · Mulheres: Maior que 50 mg/dl.  · Homens: Maior que 40 mg/dl.  Exame: Triglicérides   · Menor que 150 mg/dl.  CONTROLANDO O COLESTEROL COM DIETA  Embora fatores como exercício e estilo de vida sejam importantes, a "primeira linha de ataque" é a dieta. Isto porque se sabe que certos alimentos aumentam o colesterol e outros abaixam. A meta é equilibrar alimentos por seu efeito no colesterol e, principalmente, substituir gordura saturada e trans com outros tipos de gordura, tais como monoinsaturadas, polinsaturadas e ácidos graxos omega-3.  Em média, uma pessoa deveria consumir não mais do que 15 a 17 g de gordura saturada diariamente. Gorduras saturadas e trans são consideradas "ruins" e aumentarão o colesterol LDL. Gorduras saturadas são encontradas principalmente em produtos animais tais como carnes, manteiga e cremes. Contudo, isto não significa que você precisa sacrificar todos os seus alimentos favoritos. Hoje em dia, há substitutos com bom sabor, pobres em gordura e colesterol para a maioria dos alimentos que gostamos de comer. Escolha alternativas pobres em gordura ou sem ela. Escolha cortes redondos ou lombo de carne vermelha, uma vez que esses tipos de cortes são mais pobres em gordura e colesterol. Frango (sem a pele), peixe, vitela e peito de peru moído são escolhas excelentes. Elimine carnes gordurosas, tais como salsichas e salame. Mesmo crustáceos têm pouca ou nenhuma gordura saturada. Coma uma porção   de 3 oz (85 g) de carne magra, aves, ou peixe.   Para assar e cozinhar, os azeites são um excelente substituto para a manteiga. Os azeites monoinsaturados são de particular benefício, pois acredita-se que eles abaixam o LDL (o colesterol ruim), mas aumentam o HDL. Os azeites que você deve evitar inteiramente  são óleos tropicais saturados, como os de coco e palma.   Gorduras trans são chamadas também de "óleos parcialmente hidrogenados." Eles são óleos manipulados cientificamente, portanto, são sólidos à temperatura ambiente resultando em um tempo de armazenamento mais longo e sabor e textura melhorados de alimentos nos quais são adicionados. Gorduras trans são encontradas em margarina em barra, algumas margarina em barra, bolinhos, biscoitos e produtos assados.   Lembre-se de comer liberadamente de grupos de alimentos que são naturalmente livres de gordura saturada e trans, incluindo peixe, frutas, vegetais, feijão, grãos (cevada, arroz, cuscuz, trigo integral) e massas (sem molhos cremosos).   IDENTIFICANDO ALIMENTOS QUE DIMINUEM O COLESTEROL  Fibra solúvel encontrada em frutas como [place]s; vegetais como brócolis, batatas e cenouras; legumes como feijões, ervilhas e lentilhas; e grãos como cevada podem diminuir o colesterol. Os alimentos fortificados com esteróis de plantas ou fitoesterol podem também diminuir o colesterol. Você deve comer pelo menos 2 g por dia desses alimentos para um efeito de diminuição do colesterol.   Leia os rótulos das embalagens de alimentos no supermercado para identificar gorduras pouco saturadas, livres de gorduras trans e com baixa gordura. Selecione queijos que tenham somente 2-3 g de gordura saturada a cada 30 g. Use uma margarina de barra saudável ao coração que seja livre de gorduras trans ou óleo parcialmente hidrogenado. Ao comprar produtos assados (biscoitos, bolachas), evite óleos parcialmente hidrogenados. Pães e bolos devem ser feitos de grãos integrais (trigo integral ou aveia integral, em vez de apenas "farinha" ou "farinha enriquecida"). Compre sopas enlatadas não-cremosas com teor de sal reduzido e sem nenhuma gordura adicionada.   TÉCNICAS DE PREPARAÇÃO DE ALIMENTOS  Nunca fritura de imersão. Se você precisar fritar ou refogar, use muito pouca gordura, ou use sprays para  cozinhar anti-aderentes. Quando possível, cozinhar, assar, ou grelhar carnes, e vegetais no vapor. Em vez de preparar vegetais com manteiga ou margarina, use limão, ervas, molho de [place]çã e canela (para abobrinha e batatas-doces), iogurte desnatado e molhos para saladas com baixa gordura.   SUBSTITUTOS DE ALIMENTOS COM POUCA GORDURA SATURADA/COM BAIXA GORDURA   Carnes / Gordura Saturada (g)  · Evite: Bife, aspecto marmoreado (3 oz/85 g) / 11 g  · Prefira: Bife, magro (3 oz/85 g) / 4 g  · Evite: Hambúrguer (3 oz/85 g) / 7 g  · Prefira: Hambúrguer, magro (3 oz/85 g) / 5 g  · Evite: Presunto (3 oz/85 g) / 6 g  · Prefira: Presunto, corte magro (3 oz/85 g) / 2,4 g  · Evite: Frango, com pele (3 oz/85 g), Carne escura / 4 g  · Prefira: Frango, com a pele removida (3 oz/85 g), 2 g  · Evite: Frango, com pele (3 oz/85 g), Carne magra / 2,5 g  · Prefira: Frango, com a pele removida (3 oz/85 g), Carne magra / 1 g  Laticínios / Gordura Saturada (g)  · Evite: Leite Integral (1 xícara) / 5 g  · Prefira: Leite com baixa gordura, 2% (1 xícara) / 3 g  · Prefira: Leite com baixa gordura, 1% (1 xícara) / 1,5 g  · Prefira: Leite desnatado (1 xícara) / 0,3 g  · Evite: Queijo duro (1 oz/28 g) /   6 g  · Prefira: Queijo de leite desnatado (1 oz/28 g) / 2-3 g  · Evite: Queijo cottage, 4% de gordura (1 xícara) / 6,5 g  · Prefira:  Queijo cottage com baixa gordura, 1% de gordura (1 xícara) / 1,5 g  · Evite: Sorvete (1 xícara) / 9 g  · Prefira: Sorvete de frutas (1 xícara) / 2,5 g  · Prefira: Iogurte congelado sem gordura (1 xícara) / 0,3 g  · Prefira: Barras de fruta congeladas / traço  · Evite: Creme tipo chantilly (1 colher de sopa) / 3,5 g  · Prefira: Coberturas batidas sem laticínios (1 colher de sopa) /1 g  Condimentos / Gordura Saturada (g)  · Evite: Maionese (1 colher de sopa) / 2 g  · Prefira: Maionese com baixa gordura (1 colher de sopa) / 1 g  · Evite: Manteiga (1 colher de sopa) / 7 g  · Prefira: Margarinas extra light (1 colher de  sopa) / 1 g  · Evite: Óleo de coco (1 colher de sopa) / 11,8 g  · Prefira: Óleo de oliva  (1 colher de sopa) / 1,8 g  · Prefira: Óleo de milho  (1 colher de sopa) / 1,7 g  · Prefira: Óleo de cártamo  (1 colher de sopa) / 1,2 g  · Prefira: Óleo de girassol  (1 colher de sopa) / 1,4 g  · Prefira: Óleo de soja  (1 colher de sopa) / 2,4 g  · Escolha: Óleo de canola (1 colher de sopa) / 1 g  Document Released: 08/16/2005 Document Revised: 11/08/2011  Exit[employer]® Patient Information ©2013 [employer].

## 2012-06-06 NOTE — Progress Notes (Signed)
Stephanie [name] is a 51 year old female who presents to clinic to discuss recent lab results.   Component      Latest Ref Rng 05/08/2012   HEMOGLOBIN A1C      4.0 - 5.6 % 6.0 (H)   ESTIMATED AVERAGE GLUCOSE      74 - 160 126   Cholesterol      0 - 200 mg/dl 161 (H)   HDL      35 - 85 mg/dl 59   LDL DIRECT      0 - 100 mg/dl 096 (H)   ALANINE AMINOTRANSFERASE (ALT)      7 - 35 IU/L 42 (H)     Current Outpatient Prescriptions on File Prior to Visit:  azithromycin (ZITHROMAX) 250 MG tablet 2 TABLETS BY MOUTH TODAY, THEN 1 TABLET DAILY FOR 4 MORE DAYS Disp: 6 tablet Rfl: 0   guaiFENesin-codeine (CHERATUSSIN AC) 100-10 MG/5ML liquid 5-10 ml qhs prn cough Disp: 120 mL Rfl: 0   fluoxetine (PROZAC) 40 MG capsule Take 1 capsule by mouth daily. Disp: 30 capsule Rfl: 5   omeprazole (PRILOSEC) 40 MG capsule Take 1 capsule by mouth daily. Disp: 30 capsule Rfl: 11   hydrocortisone (ANUSOL-HC) 25 MG suppository Use as needed every morning, every evening and after every bowel movement until hemorrhoids improve Disp: 28 suppository Rfl: 2   hydrocortisone (PROCTOSOL HC) 2.5 % rectal cream Use as needed; apply every am, every pm, and after every bowel movement as needed until pain, itching &  hemorrhoids improve Disp: 120 g Rfl: 2   hydrocortisone (ANUSOL-HC) 2.5 % rectal cream Place  rectally 2 (two) times daily. Disp: 30 g Rfl: 3   naproxen (NAPROSYN) 500 MG tablet Take 1 tablet by mouth 2 (two) times daily with meals. For 10 days.  Then you can take twice a day as needed for pain. Disp: 60 tablet Rfl: 5   OMEGA 3 1000 MG OR CAPS 1 taily Disp:  Rfl:      No current facility-administered medications on file prior to visit.  Patient reports that she is taking medications regularly. Discussed cholesterol and A1c results, diabetes and risk for having it. Education about diet and exercise to lower numbers and improve general health provided.     Patient self-management goals: I want to be healthy  Medical team treatment goals:  decrease in A1c and cholesterol results  Important care providers for this condition: PCP, RN  Self-management tools given: handouts on Living with Diabetes book  Barriers: Need more health knowledge  Difficult to communicate in English  Brief Action Plan (developed with the patient): Take my medicines every day. Exercise for 20 minutes at least 3 times per week.  My confidence level that I am able to do this is: Fair  Referred to complex care management: No

## 2012-06-07 ENCOUNTER — Telehealth (HOSPITAL_BASED_OUTPATIENT_CLINIC_OR_DEPARTMENT_OTHER): Payer: Self-pay

## 2012-06-07 NOTE — Telephone Encounter (Signed)
Message copied by Gaynelle Arabian on Wed Jun 07, 2012  5:22 PM  ------       Message from: Iran Ouch       Created: Wed Jun 07, 2012  2:48 PM       Regarding: requesting to speak to a nurse       Contact: [phone]                       Nelia [facility] [id number], 51 year old, female, Telephone Information:       Home Phone      [phone]       Work Phone      Not on file.       Mobile          [phone]                     Patient's Preferred Pharmacy:               [facility]       Phone: [phone] Fax: [phone]              [id number] - [place], [place] - [address] AT [facility]       Phone: [phone] Fax: [phone]                     CONFIRMED TODAY: Lovett Sox NUMBER: [id number]       Best time to call back:        Cell phone:        Other phone:              Available times:              Patient's language of care: [place]              Patient does not need an interpreter.              Patient's PCP: Corena Herter, MD              Person calling on behalf of patient: Patient (self)              Calls today requesting to speak to a nurse regarding her chest x-ray that she had done on Monday.she still not better,with a lot pain.                       ------

## 2012-06-07 NOTE — Progress Notes (Signed)
Left voice mail to call back 

## 2012-07-17 ENCOUNTER — Encounter (HOSPITAL_BASED_OUTPATIENT_CLINIC_OR_DEPARTMENT_OTHER): Payer: Self-pay | Admitting: Family Medicine

## 2012-07-17 ENCOUNTER — Ambulatory Visit (HOSPITAL_BASED_OUTPATIENT_CLINIC_OR_DEPARTMENT_OTHER): Payer: PRIVATE HEALTH INSURANCE | Admitting: Family Medicine

## 2012-07-17 VITALS — BP 128/102 | HR 91 | Temp 97.1°F | Wt 157.6 lb

## 2012-07-17 DIAGNOSIS — R030 Elevated blood-pressure reading, without diagnosis of hypertension: Secondary | ICD-10-CM

## 2012-07-17 DIAGNOSIS — R053 Chronic cough: Secondary | ICD-10-CM

## 2012-07-17 DIAGNOSIS — R05 Cough: Principal | ICD-10-CM

## 2012-07-17 DIAGNOSIS — Z23 Encounter for immunization: Secondary | ICD-10-CM

## 2012-07-17 MED ORDER — BENZONATATE 100 MG PO CAPS
100.00 mg | ORAL_CAPSULE | Freq: Three times a day (TID) | ORAL | Status: AC | PRN
Start: 2012-07-17 — End: 2012-08-16

## 2012-07-17 NOTE — Progress Notes (Signed)
Influenza Vaccine Procedure  May 20, 2011    1. Has the patient received the information for the influenza vaccine? Yes    2. Does the patient have any of the following contraindications?  Allergy to eggs? No  Allergic reaction to previous influenza vaccines? No  Any other problems to previous influenza vaccines? No  Paralyzed by Guillain-Barre syndrome?  No  Current moderate or severe illness? No  Allergy to contact lens solution? No    3. The vaccine has been administered in the usual fashion.     Immunization information reviewed. Current VIS reviewed and given to patient/ guardian. Verbal assent obtained from patient/ guardian.  See immunization/Injection module or chart review for date of publication and additional information. Verbal assent obtained from patient/guardian. Comfort measures for possible side effects reviewed.

## 2012-07-17 NOTE — Progress Notes (Signed)
SUBJECTIVE:   Stephanie [name] is a 51 year old female who complains of cough, sometimes to point of vomiting for 2-3 mos. She denies a history of sweats, chills and fevers and does not have a history of asthma. Patient does not smoke cigarettes.    Has had cough x 2-3 mos. Took a zithromax pak.    Works as [employer]; no one else sick    1 mo ago took zpak.  Had xray which was neg except for peribronchial cuffing.    OBJECTIVE:  BP 128/102  Pulse 91  Temp(Src) 97.1 F (36.2 C) (Temporal)  Wt 157 lb 9.6 oz (71.487 kg)  BMI 29.79 kg/m2  SpO2 98%  LMP 07/11/2005  She appears well, vital signs are as noted by the nurse. Ears normal.  Throat and pharynx normal.  Neck supple. No adenopathy in the neck. Nose is congested. Sinuses non tender. The chest is clear, without wheezes or rales.    ASSESSMENT:   ? Pertussis - has already been treated with zpak; discussed that sx should be settling down; deferred trying inhaler; did want to try tessalon perles.    PLAN:  Symptomatic therapy suggested: push fluids, rest and ROV prn if symptoms persist or worsen. Call or return to clinic prn if these symptoms worsen or fail to improve as anticipated.    BP elevated today - follow up in 1 week with nursing visit; consider medication if still elevated.  Elevated in setting of being sick, bt high risk r/t overweight.

## 2012-07-17 NOTE — Progress Notes (Signed)
Influenza Vaccine Procedure  July 17, 2012    1. Has the patient received the information for the influenza vaccine? Yes    2. Does the patient have any of the following contraindications?  Allergy to eggs? No  Allergic reaction to previous influenza vaccines? No  Any other problems to previous influenza vaccines? No  Paralyzed by Guillain-Barre syndrome?  No  Current moderate or severe illness? No  Allergy to contact lens solution? No    3. The vaccine has been administered in the usual fashion.     Immunization information reviewed. Current VIS reviewed and given to patient/ guardian. Verbal assent obtained from patient/ guardian.  See immunization/Injection module or chart review for date of publication and additional information. Verbal assent obtained from patient/guardian. Comfort measures for possible side effects reviewed.

## 2012-07-25 ENCOUNTER — Ambulatory Visit (HOSPITAL_BASED_OUTPATIENT_CLINIC_OR_DEPARTMENT_OTHER): Payer: PRIVATE HEALTH INSURANCE | Admitting: Registered Nurse

## 2012-07-25 VITALS — BP 142/100 | HR 89 | Resp 18

## 2012-07-25 DIAGNOSIS — R030 Elevated blood-pressure reading, without diagnosis of hypertension: Principal | ICD-10-CM

## 2012-07-25 NOTE — Progress Notes (Signed)
51 y.o female here for BP check, last visit was elevated.  Pt confirms strong family history of HTN, including her son who is in his 8s.   Denies HA, vision changes, chest pain or pressure or SOB.    Discussed HTN with patient, educated regarding complications including MI, stroke, vessel damage.  Discussed potential symptoms including HA, vision changes.  Pt has noted she sometimes feels flushed in the face and attributes this to elevated BP. Informs her brother occasionally checks it for her.    Educated regarding diet, especially salt intake. Pt denies heavy sodium intake, feels she tries to be careful. Discussed potential sources and ways to decrease including running canned vegetables under water in colander to remove excess salt, checking labels etc. Nutriteach on low sodium diet provided to patient.   Educated about genetic link with HTN and stressed importance of diet and exercise especially with family history.       BP elevated today at 142/100. Explained I'd send message to PCP team and we will call with plan for f/u. Discussed possibility of starting medication for BP, pt seemed open to this idea.    Time spent face to face with patient for education 30 minutes

## 2012-07-28 NOTE — Addendum Note (Signed)
Addended by: Marnee Guarneri on: 07/28/2012 10:56 AM     Modules accepted: Orders

## 2012-07-28 NOTE — Progress Notes (Signed)
Reviewed note with Lurlean Nanny. Agree that patient likely has hypertension. New medication should be started. Will refer patient to Suella Broad, clinical pharmacist, for evaluation and medication recommendation.

## 2012-08-07 ENCOUNTER — Telehealth (HOSPITAL_BASED_OUTPATIENT_CLINIC_OR_DEPARTMENT_OTHER): Payer: Self-pay

## 2012-08-07 NOTE — Progress Notes (Addendum)
Patient referred to pharmacotherapy services for hypertension on 08/02/12 by Lurlean Nanny, PA-C. Called patient with interpreter services and left message to call and schedule and appointment with the pharmacist at [facility]. Left clinic callback number. Will continue to outreach patient.

## 2012-08-09 ENCOUNTER — Ambulatory Visit (HOSPITAL_BASED_OUTPATIENT_CLINIC_OR_DEPARTMENT_OTHER): Payer: PRIVATE HEALTH INSURANCE

## 2012-08-09 ENCOUNTER — Telehealth (HOSPITAL_BASED_OUTPATIENT_CLINIC_OR_DEPARTMENT_OTHER): Payer: Self-pay

## 2012-08-09 NOTE — Progress Notes (Signed)
Translator needed: Yes [place])    Pt was a no show to today's visit. Called pt and left VM encouraging pt to return call to clinic to reschedule visit. Will continue outreach.

## 2012-08-10 ENCOUNTER — Telehealth (HOSPITAL_BASED_OUTPATIENT_CLINIC_OR_DEPARTMENT_OTHER): Payer: Self-pay

## 2012-08-10 NOTE — Progress Notes (Signed)
Called patient with interpreter services. Left message with clinic callback number and instructions to make appointment as soon as possible with pharmacist at [facility].

## 2012-08-11 ENCOUNTER — Encounter (HOSPITAL_BASED_OUTPATIENT_CLINIC_OR_DEPARTMENT_OTHER): Payer: Self-pay | Admitting: Registered Nurse

## 2012-08-11 NOTE — Patient Instructions (Signed)
Patient has not been answering phone. Letter to be sent

## 2012-08-15 ENCOUNTER — Ambulatory Visit (HOSPITAL_BASED_OUTPATIENT_CLINIC_OR_DEPARTMENT_OTHER): Payer: PRIVATE HEALTH INSURANCE

## 2012-08-15 DIAGNOSIS — I10 Essential (primary) hypertension: Principal | ICD-10-CM

## 2012-08-15 DIAGNOSIS — E663 Overweight: Secondary | ICD-10-CM

## 2012-08-15 DIAGNOSIS — R7309 Other abnormal glucose: Secondary | ICD-10-CM

## 2012-08-15 NOTE — Progress Notes (Signed)
Treadmill 1hr, salads brown rice.  Many changes since meeting with nursing.    HTN Management Follow Up Visit Skeet Latch, [facility].D.    SUBJECTIVE   Translator used for this visit: Yes: 3333   Stephanie [name] is a 51 year old female who presents to clinic for follow up.     Family History    Cancer - Other Father     Comment: stomach    Heart Brother     Comment: died at 79 during valve replacement    Heart Brother     Comment: chestpain with neg w/u    Heart Mother     Comment: Died of MI age 71         OBJECTIVE    Current Outpatient Prescriptions on File Prior to Visit:  OMEGA 3 1000 MG OR CAPS 1 taily Disp:  Rfl:      No current facility-administered medications on file prior to visit.       [facility] ([facility]  Phone: [phone] Fax: [phone]    [id number] - [place], [place] - [address] AT [facility]  Phone: [phone] Fax: [phone]        Most Recent BP Reading(s)  02/11/15 : 133/60  02/04/15 : 135/79  12/05/14 : 118/66  10/15/14 : 132/88  08/13/14 : 133/78        CREATININE   Date Value   02/04/2015 0.7 mg/dL   28/41/3244 0.5 mg/dL   09/01/7251 0.5 mg/dl   ----------      BUN (UREA NITROGEN)   Date Value   02/04/2015 13 mg/dL   66/44/0347 12 mg/dL   42/59/5638 12 mg/dl   ----------          ALANINE AMINOTRANSFERASE   Date Value   02/04/2015 37 U/L   10/31/2013 61 U/L (H)   05/08/2012 42 IU/L (H)   ----------    ASPARTATE AMINOTRANSFERASE   Date Value   02/04/2015 21 U/L   10/31/2013 36 U/L (H)   04/22/2008 35 IU/L (H)   ----------    ALKALINE PHOSPHATASE   Date Value   02/04/2015 89 U/L   10/31/2013 100 U/L   04/22/2008 80 IU/L   ----------        Health Maintenance:  PHYSICAL EXAM (AGE 20+) due on 02/21/2014  COLONOSCOPY due on 01/20/2015  PHQ-9 due on 04/15/2015      Smoking status: Former Smoker   Types: Cigarettes   Quit date: 07/27/2001   Smokeless tobacco: Not on file         ASSESSMENT  Blood pressure: Controlled less than 140/90    PLAN  1.  Continue current BP treatment, reassess at follow-up  2. Treadmill 1hr, salads brown rice.  Many changes since meeting with nursing.    Planned return date: tbd  Provided verbal and written dosing instructions.  Greater than 80% of this visit was spent counseling the patient regarding medication management and chronic care disease education.

## 2012-09-19 ENCOUNTER — Ambulatory Visit (HOSPITAL_BASED_OUTPATIENT_CLINIC_OR_DEPARTMENT_OTHER): Payer: PRIVATE HEALTH INSURANCE

## 2012-09-19 ENCOUNTER — Telehealth (HOSPITAL_BASED_OUTPATIENT_CLINIC_OR_DEPARTMENT_OTHER): Payer: Self-pay

## 2012-09-19 NOTE — Progress Notes (Addendum)
Called pt to reschedule missed appointment.  Left message with clinic callback number and instructions to make an appointment as soon as possible.

## 2012-09-22 ENCOUNTER — Telehealth (HOSPITAL_BASED_OUTPATIENT_CLINIC_OR_DEPARTMENT_OTHER): Payer: Self-pay

## 2012-09-22 NOTE — Progress Notes (Signed)
Called pt to reschedule missed appointment.  Left message with clinic callback number and instructions to make an appointment as soon as possible.

## 2012-11-10 ENCOUNTER — Ambulatory Visit (HOSPITAL_BASED_OUTPATIENT_CLINIC_OR_DEPARTMENT_OTHER): Payer: PRIVATE HEALTH INSURANCE | Admitting: Registered Nurse

## 2012-11-17 ENCOUNTER — Ambulatory Visit (HOSPITAL_BASED_OUTPATIENT_CLINIC_OR_DEPARTMENT_OTHER): Payer: PRIVATE HEALTH INSURANCE | Admitting: Family Medicine

## 2012-11-17 VITALS — BP 140/90 | HR 87 | Temp 98.6°F | Wt 144.2 lb

## 2012-11-17 DIAGNOSIS — Z411 Encounter for cosmetic surgery: Secondary | ICD-10-CM

## 2012-11-17 DIAGNOSIS — Z13220 Encounter for screening for lipoid disorders: Secondary | ICD-10-CM

## 2012-11-17 DIAGNOSIS — I10 Essential (primary) hypertension: Principal | ICD-10-CM

## 2012-11-17 MED ORDER — HYDROCHLOROTHIAZIDE 25 MG PO TABS
25.0000 mg | ORAL_TABLET | Freq: Every day | ORAL | Status: DC
Start: 2012-11-17 — End: 2013-05-31

## 2012-11-17 NOTE — Progress Notes (Signed)
SUBJECTIVE  Stephanie [name] is a 52 yo woman with history of R breast enhancement who presents with complaint of high blood pressure and high cholesterol;    Reports BP has been high; systolic in 140's and 150's;  Denies chest pain but reports heart races and slight HA when BP high;    Pt would like to control BP and cholesterol prior to having surgery;  She has lost 13 lbs and has cut out salt;    Also requesting letter to reschedule surgery as she was charged $700 after she canceled last;      Current Outpatient Prescriptions:  hydrochlorothiazide (HYDRODIURIL) 25 MG tablet Take 1 tablet by mouth daily. Disp: 30 tablet Rfl: 0   omeprazole (PRILOSEC) 40 MG capsule Take 1 capsule by mouth daily. Disp: 30 capsule Rfl: 11   naproxen (NAPROSYN) 500 MG tablet Take 1 tablet by mouth 2 (two) times daily with meals. For 10 days.  Then you can take twice a day as needed for pain. Disp: 60 tablet Rfl: 5   OMEGA 3 1000 MG OR CAPS 1 taily Disp:  Rfl:      No current facility-administered medications for this visit.   Hypertension ROS: no TIA's, no chest pain on exertion, no dyspnea on exertion, no swelling of ankles.     Objective:   BP 140/90  Pulse 87  Temp(Src) 98.6 F (37 C) (Temperature probe)  Wt 144 lb 4 oz (65.431 kg)  BMI 27.27 kg/m2  SpO2 99%  LMP 07/11/2005   Appearance healthy, alert and cooperative.  General exam BP noted to be borderline elevated today in office, S1, S2 normal, no gallop, no murmur, chest clear, no JVD, no HSM, no edema.   Lab review: no lab studies available for review at time of visit.     Assessment/Plan:  (401.9) Hypertension  (primary encounter diagnosis)  Comment: BP borderline; will start treatment as pt reports systolic ambulatory readings in 150's;  Will draw BMP and have pt return in 1-2 weeks; she would also like pre-op exam at that time;  Plan: BASIC METABOLIC PANEL       (V77.91) Screening cholesterol level  Comment: reviewed result letter from PCP to patient; she  has lost 13 lbs; she was congratulated on her efforts; will check lipid as she wants to know if numbers have improved; placed as future order as she is not fasting  Plan: LIPID PANEL    (V50.1) Other plastic surgery for unacceptable cosmetic appearance  Comment: pt reports breast augmentation surgery in April for L breast; she would like to control her BP and cholesterol first;  Plan: letter written and given to pt; she will return for pre-op exam and ekg ( as she wants this exam now but will schedule surgery for July);

## 2012-11-21 ENCOUNTER — Ambulatory Visit (HOSPITAL_BASED_OUTPATIENT_CLINIC_OR_DEPARTMENT_OTHER): Payer: PRIVATE HEALTH INSURANCE | Admitting: Ophthalmology

## 2012-11-21 ENCOUNTER — Encounter (HOSPITAL_BASED_OUTPATIENT_CLINIC_OR_DEPARTMENT_OTHER): Payer: Self-pay | Admitting: Ophthalmology

## 2012-11-21 DIAGNOSIS — H269 Unspecified cataract: Secondary | ICD-10-CM

## 2012-11-21 DIAGNOSIS — H524 Presbyopia: Secondary | ICD-10-CM

## 2012-11-21 DIAGNOSIS — H5200 Hypermetropia, unspecified eye: Secondary | ICD-10-CM

## 2012-11-21 DIAGNOSIS — H52209 Unspecified astigmatism, unspecified eye: Secondary | ICD-10-CM

## 2012-11-21 HISTORY — DX: Unspecified cataract: H26.9

## 2012-11-21 HISTORY — DX: Presbyopia: H52.00

## 2012-11-21 NOTE — Nursing Note (Signed)
>>   Stephanie [name]     Tue Nov 21, 2012  3:54 PM  Here for routine eye exams. Pt was told she has Cataracts in one eye when she went for a check up in [place]. Blurry Va cc.

## 2012-11-21 NOTE — Progress Notes (Signed)
Very mild, early cataracts, both eyes. Too early to consider cataract surgery. Follow, no specific therapy.    Hyperopia with astigmatism and presbyopia. She's given a prescription for glasses.    She is considering LASIK, to be less dependent upon glasses. We discussed options of LASIK for hyperopia with astigmatism. She is considering, but this is expensive options.

## 2012-11-30 ENCOUNTER — Other Ambulatory Visit (HOSPITAL_BASED_OUTPATIENT_CLINIC_OR_DEPARTMENT_OTHER): Payer: Self-pay

## 2012-11-30 ENCOUNTER — Ambulatory Visit (HOSPITAL_BASED_OUTPATIENT_CLINIC_OR_DEPARTMENT_OTHER): Payer: PRIVATE HEALTH INSURANCE

## 2012-11-30 DIAGNOSIS — R7309 Other abnormal glucose: Secondary | ICD-10-CM

## 2012-11-30 DIAGNOSIS — E559 Vitamin D deficiency, unspecified: Secondary | ICD-10-CM

## 2012-11-30 DIAGNOSIS — E7800 Pure hypercholesterolemia, unspecified: Secondary | ICD-10-CM

## 2012-11-30 DIAGNOSIS — Z1159 Encounter for screening for other viral diseases: Principal | ICD-10-CM

## 2012-11-30 DIAGNOSIS — R030 Elevated blood-pressure reading, without diagnosis of hypertension: Secondary | ICD-10-CM

## 2012-11-30 NOTE — Progress Notes (Signed)
This encounter was opened in error.  Please disregard.

## 2013-02-21 ENCOUNTER — Ambulatory Visit (HOSPITAL_BASED_OUTPATIENT_CLINIC_OR_DEPARTMENT_OTHER): Payer: PRIVATE HEALTH INSURANCE | Admitting: Family Medicine

## 2013-02-21 ENCOUNTER — Encounter (HOSPITAL_BASED_OUTPATIENT_CLINIC_OR_DEPARTMENT_OTHER): Payer: Self-pay

## 2013-02-21 ENCOUNTER — Encounter (HOSPITAL_BASED_OUTPATIENT_CLINIC_OR_DEPARTMENT_OTHER): Payer: Self-pay | Admitting: Family Medicine

## 2013-02-21 VITALS — BP 110/68 | HR 80 | Temp 97.8°F | Wt 148.4 lb

## 2013-02-21 DIAGNOSIS — R194 Change in bowel habit: Secondary | ICD-10-CM

## 2013-02-21 DIAGNOSIS — F329 Major depressive disorder, single episode, unspecified: Secondary | ICD-10-CM

## 2013-02-21 DIAGNOSIS — M940 Chondrocostal junction syndrome [Tietze]: Secondary | ICD-10-CM

## 2013-02-21 DIAGNOSIS — R5381 Other malaise: Secondary | ICD-10-CM

## 2013-02-21 DIAGNOSIS — R5383 Other fatigue: Secondary | ICD-10-CM

## 2013-02-21 DIAGNOSIS — Z0000 Encounter for general adult medical examination without abnormal findings: Principal | ICD-10-CM

## 2013-02-21 LAB — CBC, PLATELET & DIFFERENTIAL
ABSOLUTE BASO COUNT: 0 10*3/uL (ref 0.0–0.1)
ABSOLUTE EOSINOPHIL COUNT: 0.1 10*3/uL (ref 0.0–0.8)
ABSOLUTE IMM GRAN COUNT: 0.01 10*3/uL (ref 0.00–0.03)
ABSOLUTE LYMPH COUNT: 2.8 10*3/uL (ref 0.6–5.9)
ABSOLUTE MONO COUNT: 0.5 10*3/uL (ref 0.2–1.4)
ABSOLUTE NEUTROPHIL COUNT: 2.7 10*3/uL (ref 1.6–8.3)
BASOPHIL %: 0.5 % (ref 0.0–1.2)
EOSINOPHIL %: 1.8 % (ref 0.0–7.0)
HEMATOCRIT: 37.3 % (ref 34.1–44.9)
HEMOGLOBIN: 12.3 g/dL (ref 11.2–15.7)
IMMATURE GRANULOCYTE %: 0.2 % (ref 0.0–0.4)
LYMPHOCYTE %: 44.9 % (ref 15.0–54.0)
MEAN CORP HGB CONC: 33 g/dL (ref 31.0–37.0)
MEAN CORPUSCULAR HGB: 31.4 pg (ref 26.0–34.0)
MEAN CORPUSCULAR VOL: 95.2 fL (ref 80.0–100.0)
MEAN PLATELET VOLUME: 11.4 fL (ref 8.7–12.5)
MONOCYTE %: 8.1 % (ref 4.0–13.0)
NEUTROPHIL %: 44.5 % (ref 40.0–75.0)
PLATELET COUNT: 264 10*3/uL (ref 150–400)
RBC DISTRIBUTION WIDTH STD DEV: 47.6 fL — ABNORMAL HIGH (ref 35.1–46.3)
RBC DISTRIBUTION WIDTH: 13.7 % (ref 11.5–14.3)
RED BLOOD CELL COUNT: 3.92 M/uL (ref 3.90–5.20)
WHITE BLOOD CELL COUNT: 6.2 10*3/uL (ref 4.0–11.0)

## 2013-02-21 LAB — FECAL OCCULT (POINT OF CARE) OFFICE/INPATIENT TEST 1 CARD
LOT #: 2821
OCCULT BLOOD: NEGATIVE

## 2013-02-21 MED ORDER — NAPROXEN 500 MG PO TABS
500.0000 mg | ORAL_TABLET | Freq: Two times a day (BID) | ORAL | Status: DC
Start: 2013-02-21 — End: 2013-10-31

## 2013-02-21 MED ORDER — NAPROXEN 500 MG PO TBEC
500.0000 mg | DELAYED_RELEASE_TABLET | Freq: Two times a day (BID) | ORAL | Status: DC
Start: 2013-02-21 — End: 2013-02-21

## 2013-02-21 NOTE — Progress Notes (Signed)
Chief Complaint:  Stephanie [name] is a 52 year old female who presents for a physical exam.    She has no additional complaints or concerns.    She is a having additional breast augmentation in July.      Patient Active Problem List:     Unspecified Anemia     URINARY CALCULUS NOS     Overweight     Pure hypercholesterolemia     FAMILY HX GI MALIGNANCY     Leiomyoma of uterus, unspecified     Vitamin D deficiency     Major depressive disorder     Irritable Bladder     Elevated hemoglobin A1c     Other plastic surgery for unacceptable cosmetic appearance     Rotator cuff tear arthropathy of right shoulder     AC joint arthropathy     Impingement syndrome of right shoulder     Hematest positive stools     Tubular adenoma of colon     Diverticulosis of colon     Elevated blood pressure (not hypertension)     Hyperopia with astigmatism and presbyopia     Immature cataract        Current Outpatient Prescriptions:  hydrochlorothiazide (HYDRODIURIL) 25 MG tablet Take 1 tablet by mouth daily. Disp: 30 tablet Rfl: 0   omeprazole (PRILOSEC) 40 MG capsule Take 1 capsule by mouth daily. Disp: 30 capsule Rfl: 11   naproxen (NAPROSYN) 500 MG tablet Take 1 tablet by mouth 2 (two) times daily with meals. For 10 days.  Then you can take twice a day as needed for pain. Disp: 60 tablet Rfl: 5   OMEGA 3 1000 MG OR CAPS 1 taily Disp:  Rfl:      No current facility-administered medications for this visit.    Allergies:  Review of Patient's Allergies indicates:  No Known Allergies    Health Maintenance:  Hiv Screening due on 09/16/1973  Hep B High Risk Vaccine Eval (Once) due on 09/16/1978  Flu Vaccine Seasonal due on 04/30/2013  Physical Exam (Age 46+) due on 02/21/2014  Tetanus (16 And Over) due on 06/18/2014  Mammography due on 07/17/2014  Colonoscopy due on 01/20/2015  Pap Smear due on 07/23/2015  Hpv Screening due on 07/23/2015  Health Care Proxy due on 12/20/2016  Lipid Screening due on 05/08/2017    Immunizations:  Immunization  History   Administered Date(s) Administered    Flu Vaccine > 3 Yrs 06/17/2011, 07/17/2012    Td 06/18/2004       Histories:    Past Medical History    Depression     Hyperopia with astigmatism and presbyopia 11/21/2012    Immature cataract 11/21/2012         Past Surgical History    OB ANTEPARTUM CARE CESAREAN DLVR & POSTPARTUM      Comment x3    TOTAL ABDOMINAL HYSTERECT W/WO RMVL TUBE OVARY      Comment for fibroids, still has cervix    BREAST ENHANCEMENT SURGERY      Comment 12/12 had bilateral mammoplasty and saline implants, had 2nd surgery 5/30 for revision       Social History   Marital Status: Divorced  Spouse Name: N/A    Years of Education: N/A  Number of Children: N/A     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; 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     Social History Main Topics   Smoking status: Former Smoker     Types: Cigarettes  Quit date: 07/27/2001    Smokeless tobacco: Not on file    Alcohol Use: Yes  10.0 oz/week     Comment: weekends    Drug Use: No    Sexually Active: Not Currently  Partner(s): Female    Comment: G3P3, no hx STD, no hx abn Pap     Other Topics Concern   None on file     Social History Narrative   None on file       Family History    Cancer - Other Father     Comment: stomach    Heart Brother     Comment: died at 35 during valve replacement    Heart Brother     Comment: chestpain with neg w/u    Heart Mother     Comment: Died of MI age 73       Review of Systems:                   Skin: negative  Eyes: h/o immature cataract; some blurry vision, not bad; has appointment 9/14 with opthal  Ears/Nose/Throat: negative  Respiratory: negative  Cardiovascular: negative  Gastrointestinal: negative  Genitourinary:reports BM's 5-8 per day; not loose; sometimes feels she has to rush to the bathroom; had not had these symptoms before;    UJW:JXBJYN menses, no abnormal bleeding, pelvic pain or discharge  no breast pain or new or enlarging lumps on self exam  Musculoskeletal: Pain in her chest and neck x one  week;  Comes and stays the whole day;  Movement worsen  Does not radiation   Taking motrin with mild effect;  Neurologic: negative  Endocrine: negative  Psychiatric: negative  Hematologic/Lymphatic/Immunologic: fatigue; body feels tired;      Physical:  BP 110/68  Pulse 80  Temp(Src) 97.8 F (36.6 C) (Temporal)  Wt 148 lb 6.4 oz (67.314 kg)  BMI 28.05 kg/m2  SpO2 100%  LMP 07/11/2005  General appearance: healthy, alert, well developed, well nourished  Eyes: conjunctivae/corneas clear. PERRL, EOM's intact. Fundi benign  Skin: skin color, texture, turgor are normal  Head: Normocephalic. No masses, lesions, tenderness or abnormalities  Ears: External ears normal. Canals clear. TM's normal.  Nose/Sinuses: Nares normal. Septum midline. Mucosa normal. No drainage or sinus tenderness.  Oropharynx: Lips, mucosa, and tongue normal. Teeth and gums normal. Oropharynx moist and without lesion  Neck: Mild TTP along R sternocleidomastoid and R posterior neck; Neck supple. No adenopathy. Thyroid symmetric, normal size, and without nodularity  Back: Back symmetric, no curvature. ROM normal. No CVA tenderness.  Lungs: Good diaphragmatic excursion. Lungs clear to auscultation bilaterally  Heart:RRR. No murmurs, clicks, gallops or rubs;  Abdomen: Abdomen soft, non-tender. BS normal. No masses, no organomegaly  Extremities: Extremities normal. No deformities, edema, or skin discoloration  Musculoskeletal: Spine ROM normal. Muscular strength intact. TTP over mid chest;   Peripheral pulses: radial=4/4  Neuro: Gait normal. Reflexes normal and symmetric. Sensation grossly normal       Health Counseling:  Smoking:  Reviewed and Discussed  Substance Use Issues:   Reviewed and Discussed   Diet, Exercise, Wt. Control:  Reviewed and Discussed  Dental Health:  Reviewed and Discussed  Vision Health:  Reviewed and Discussed  Mental Health:  Reviewed and Discussed  Domestic Violence:  Reviewed and Discussed  Sexual Health:  Reviewed and  Discussed  BSE:  Reviewed and Discussed  Osteoperosis: Reviewed and Discussed  Health Care Proxy:  Reviewed and Discussed    EKG: normal EKG, normal  sinus rhythm.      ASSESSMENT/PLAN:  (V70.0) Routine general medical examination at a health care facility  (primary encounter diagnosis)  Comment: benign exam today; continue routine screening  Plan: EKG, FECAL OCCULT (POINT OF CARE)         OFFICE/INPATIENT TEST 1 CARD, HEP B SURFACE         ANTIGEN, HEPATITIS B SURFACE ANTIBODY, HIV         ANTIGEN ANTIBODY        (780.79) Malaise and fatigue  Comment: will check labs given complaint of fatigue;  Plan: VITAMIN D,25 HYDROXY, CBC + PLT + AUTO DIFF,         THYROID SCREEN TSH REFLEX FT4        (733.6) Costochondritis  Comment: pt reported atypical chest pain; pain reproducible on exam and EKG normal; pt reassured and given short-term naproxen for pain;  Plan: naproxen (NAPROSYN) 500 MG tablet,   We discussed naproxen and the importance of medication compliance. The patient was ready to learn and no apparent learning barriers were identified. I explained the diagnosis and treatment plan, and the patient expressed understanding of the content. Possible side effects of the prescribed medication(s) were explained, including GI discomfort/bleed.  I attempted to answer any questions regarding the diagnosis and the proposed treatment.            (787.99) Frequent bowel movements  Comment: neg FOBT: likely benign but given GI history will refer for eval  Plan: REFERRAL TO GASTROENTEROLOGY ( INT)       (296.20) Major depressive disorder  Comment: PHQ9 = 10 reports symptoms controlled on prozac but med list suggests she should be out of med as no refills in system;   Plan: will call pharmacy to review refill history

## 2013-02-21 NOTE — Patient Instructions (Signed)
[facility]  [facility]  [address]  [place] [address]  Clinical Nutrition Services    Calcium and Your Health    Calcium is a mineral that helps build strong bones and teeth, helps prevent brittle bones (osteoporosis) and hip fractures, and helps maintain a normal blood pressure. It is very important to get enough calcium each day.    * CALCIUM NEEDS AT ALL AGES: (RDI 2000)  Infants birth to 12 months  is 210 -270 mg/day          Child 1-8 years  is 500-.800 mg/day                     Adolescent 9-18years is 1300 mg/day                   Adults 19-30 years is 1000 mg/day  Adults 31-50 years is 1000 mg/day  Adults 51 and older is 1200 mg/day    * TO MAINTAIN GOOD BONE HEALTH, EAT A CALCIUM RICH DIET:    FOODS                                      Yogurt, plain, nonfat: 1 cup serving is 450 mgs of calcium    Ricotta cheese, part skim: 1/2 cup serving is 340 mgs of calcium    Milk, skim: 1 cup serving is 300 mgs of calcium    Milk, whole: 1 cup serving is 290 mgs of calcium    Sardines, canned, with bones: 3 oz serving is 280 mgs of calcium    Yogurt, plain, whole milk:1 cup serving is 275 mgs of calcium    Orange juice, calcium fortified:1 cup serving is 270 mgs of calcium    Swiss cheese: 1 oz serving is 270 mgs of calcium    Spinach, cooked: 1 cup serving is 240 mgs of calcium   Turnip greens, rhubarb, cooked:1 cup serving is 200 mgs of calcium   Salmon, canned, with bones: 3 oz serving is 180 mgs of calcium   Ice Cream: 1 cup serving is 175 mgs of calcium   Pudding, instant mix: 1/2 cup serving is 150 mgs of calcium   Almonds:  /2 cup serving is 150 mgs of calcium   Kale, cooked: 1 cup serving is 100 mgs of calcium   Lobster, cooked: 6 oz serving is 100 mgs of calcium   Tofu, with calcium sulfate:  3oz serving is 100 mgs of calcium     EXERCISE: Weight bearing exercises, such as walking, jogging, racquet sports, aerobics, and weight training  help make bones stronger.      VITAMIN D: Vitamin D is essential to help your body absorb calcium. A healthy body can make its own vitamin D with the help of sunlight. But in the winter months, you may not get enough sun, and should make   sure you get it from another source, like milk or a multivitamin.     HORMONES: The hormone estrogen helps the female body and bones to use calcium. After menopause women need to discuss hormone treatment with their doctor.    * This is just a first step in helping improve your health.  * For help with a meal plan for you, make an appointment with the Nutritionist.  * For more information, you can also contact the National Nutrition Hotline   1-800-366-1655                                                              9/95 MAS 1/96, 12/97eqj, 01/02, 4/02

## 2013-02-21 NOTE — Progress Notes (Signed)
Medical records request form received via walk-in. Medical records request form was placed in the medical records bin.

## 2013-02-23 LAB — HIV ANTIGEN ANTIBODY: HIV 1/2 PLUS O AG/AB SCREEN: NONREACTIVE

## 2013-02-23 LAB — HEPATITIS B SURFACE ANTIGEN: HEPATITIS B SURFACE ANTIGEN: NONREACTIVE

## 2013-02-23 LAB — THYROID SCREEN TSH REFLEX FT4: THYROID SCREEN TSH REFLEX FT4: 1.03 u[IU]/mL (ref 0.358–3.740)

## 2013-02-23 LAB — VITAMIN D,25 HYDROXY: VITAMIN D,25 HYDROXY: 24 ng/mL — ABNORMAL LOW (ref 30.0–100.0)

## 2013-02-23 LAB — HEPATITIS B SURFACE ANTIBODY: HEPATITIS B SURFACE ANTIBODY: REACTIVE

## 2013-02-27 LAB — EKG

## 2013-03-13 MED ORDER — VITAMIN D 50 MCG (2000 UT) PO TABS
1.00 | ORAL_TABLET | Freq: Every day | ORAL | Status: AC
Start: 2013-03-13 — End: 2013-04-12

## 2013-03-13 NOTE — Addendum Note (Signed)
Addended by: Rexene Agent on: 03/13/2013 11:55 AM     Modules accepted: Orders

## 2013-05-17 ENCOUNTER — Other Ambulatory Visit (HOSPITAL_BASED_OUTPATIENT_CLINIC_OR_DEPARTMENT_OTHER): Payer: Self-pay

## 2013-05-22 ENCOUNTER — Ambulatory Visit (HOSPITAL_BASED_OUTPATIENT_CLINIC_OR_DEPARTMENT_OTHER): Payer: PRIVATE HEALTH INSURANCE | Admitting: Ophthalmology

## 2013-05-22 DIAGNOSIS — H5200 Hypermetropia, unspecified eye: Secondary | ICD-10-CM

## 2013-05-22 DIAGNOSIS — H52209 Unspecified astigmatism, unspecified eye: Secondary | ICD-10-CM

## 2013-05-22 DIAGNOSIS — H524 Presbyopia: Secondary | ICD-10-CM

## 2013-05-22 DIAGNOSIS — H269 Unspecified cataract: Principal | ICD-10-CM

## 2013-05-22 NOTE — Nursing Note (Signed)
>>   Stephanie [name] [place]     Tue May 22, 2013  3:30 PM  Pt here for 6 mo f/u mild early cataracts OU. She has no new complaints or ocular changes.    Ocular Hx:  Hyperopia with astigmatism and presbyopia.  Mild early cataracts. OU

## 2013-05-30 ENCOUNTER — Ambulatory Visit (HOSPITAL_BASED_OUTPATIENT_CLINIC_OR_DEPARTMENT_OTHER): Payer: PRIVATE HEALTH INSURANCE | Admitting: Family Medicine

## 2013-05-30 VITALS — BP 126/80 | HR 77 | Temp 97.5°F | Ht 61.0 in | Wt 151.6 lb

## 2013-05-30 DIAGNOSIS — R29898 Other symptoms and signs involving the musculoskeletal system: Secondary | ICD-10-CM

## 2013-05-30 DIAGNOSIS — M75101 Unspecified rotator cuff tear or rupture of right shoulder, not specified as traumatic: Principal | ICD-10-CM

## 2013-05-30 DIAGNOSIS — M12811 Other specific arthropathies, not elsewhere classified, right shoulder: Principal | ICD-10-CM

## 2013-05-30 DIAGNOSIS — M7541 Impingement syndrome of right shoulder: Secondary | ICD-10-CM

## 2013-05-30 DIAGNOSIS — M79673 Pain in unspecified foot: Secondary | ICD-10-CM

## 2013-05-30 DIAGNOSIS — Z23 Encounter for immunization: Secondary | ICD-10-CM

## 2013-05-30 NOTE — Progress Notes (Deleted)
Chief Complaint:  1- Lump on left hand x 3 weeks, pain 1 out 10 but the patient feel it all the time and is worried about cancer because her sister had skin cancer. No medications. Denies injury    2 Left heel pain x 2weeks, persistent, relief with Ibuprofen 600mg  and with medication from [place]  every 6 hours. No radiation. Denies injury     3. Right shoulder pain still persistent, over 15 years, relief with Ibuprofen and [place] medication and worst with movement. 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   Referred to physical therapy in past, but haven't visit the physical therapist yet.   Patient is interested in physical therapy.    4. Blood pressure 140/90 at home 5 days ago. - felt better after rest. But felt dizzy for 2 days.

## 2013-05-30 NOTE — Progress Notes (Signed)
SUBJECTIVE  Stephanie [name] is a 52 yo woman who presents with multiple complaints;  Due to the language barrier, the office visit was conducted with assistance from Antony Odea, PA-C who speaks portuguese;   Pt declined interpretor services  1) reports right shoulder pain; has had this in the past; h/o right shoulder rotator cuff tear and impingement;  States she is taking [place] medication that her daughter brings; helps a lot  Rated 6/10;    2) reports lump in her hand; has been there 2 weeks;  She is worried because her sister had a lump on the face which she reports was cancerous; unsure what type;    3) reports b/l heel pain x 2 weeks; she has tried nsaids and pain has improved;    Review of Systems -   Denies n/v/d/f, dizziness, chest discomfort, SOB, edema or leg cramps      Patient Active Problem List:     Unspecified Anemia     URINARY CALCULUS NOS     Overweight     Pure hypercholesterolemia     FAMILY HX GI MALIGNANCY     Leiomyoma of uterus, unspecified     Vitamin D deficiency     Major depressive disorder     Irritable Bladder     Elevated hemoglobin A1c     Other plastic surgery for unacceptable cosmetic appearance     Rotator cuff tear arthropathy of right shoulder     AC joint arthropathy     Impingement syndrome of right shoulder     Hematest positive stools     Tubular adenoma of colon     Diverticulosis of colon     Elevated blood pressure (not hypertension)     Hyperopia with astigmatism and presbyopia     Immature cataract    OBJECTIVE  BP 126/80   Pulse 77   Temp(Src) 97.5 F (36.4 C) (Temporal)   Ht 5\' 1"  (1.549 m)   Wt 151 lb 9.6 oz (68.765 kg)   BMI 28.66 kg/m2   SpO2 98%   LMP 07/11/2005  O: NAD  HEENT: atraumatic, no icterus, OP clear without erythema or exudates; TM's without erythema or effusion; neck supple, no LAN  Skin: no lesions or rash; callus over R 4th MCP;  Lungs: CTA b/l  Cardiac: nl S1/S2, no murmurs, rubs, gallops  Shoulder exam: moderate tenderness over R  biceps; 3/5 SAR at R shoulder; reduced range of motion with abduction;  Ext: heels w/o ttp b/l    A/P  (716.11) Rotator cuff tear arthropathy of right shoulder  (primary encounter diagnosis)  Comment: pt has h/o RC tear and impingement syndrome; she was followed by ortho until she stopped attending appt; she is willing to f/u with ortho and restart PT; she is taking [place] med for pain; does not recall name but states it is effect; she will bring this to next appt'  Plan: REFERRAL TO PHYSICAL THERAPY ( INT), REFERRAL         TO ORTHOPEDICS ( INT)    (V04.81) Need for prophylactic vaccination and inoculation against influenza  Comment:   Plan: IMMUNIZATION ADMIN SINGLE, RN, INFLUENZA VIRUS         QUAD PRESV FREE VACCINE 3/> YRS IM (PUBLIC)         (726.2) Impingement syndrome of right shoulder  Comment: see above  Plan: REFERRAL TO PHYSICAL THERAPY ( INT), REFERRAL         TO ORTHOPEDICS ( INT)        (  729.5) Heel pain  Comment: resolve per pt  Plan: ? Plantar fasciatitis; we discussed foot exercises for future occurences    (V49.5) Hand problems  Comment: pt reports lesion in her hand x 2 weeks; she is scared this is cancer; I explained lesion in her hand feels like callus; possible tendonitis under callus as well;  Pt not completely convinced but agrees to come back in 2 weeks for f/u; will monitor until then;

## 2013-06-18 ENCOUNTER — Ambulatory Visit (HOSPITAL_BASED_OUTPATIENT_CLINIC_OR_DEPARTMENT_OTHER): Payer: PRIVATE HEALTH INSURANCE | Admitting: Family Medicine

## 2013-06-18 VITALS — BP 130/88 | HR 101 | Temp 98.2°F | Wt 153.6 lb

## 2013-06-18 DIAGNOSIS — L84 Corns and callosities: Principal | ICD-10-CM

## 2013-06-18 DIAGNOSIS — M12811 Other specific arthropathies, not elsewhere classified, right shoulder: Secondary | ICD-10-CM

## 2013-06-18 DIAGNOSIS — M7541 Impingement syndrome of right shoulder: Secondary | ICD-10-CM

## 2013-06-18 DIAGNOSIS — M75101 Unspecified rotator cuff tear or rupture of right shoulder, not specified as traumatic: Secondary | ICD-10-CM

## 2013-06-18 DIAGNOSIS — I10 Essential (primary) hypertension: Secondary | ICD-10-CM | POA: Insufficient documentation

## 2013-07-03 ENCOUNTER — Ambulatory Visit (HOSPITAL_BASED_OUTPATIENT_CLINIC_OR_DEPARTMENT_OTHER): Payer: PRIVATE HEALTH INSURANCE | Admitting: Orthopaedic Surgery

## 2013-07-05 ENCOUNTER — Ambulatory Visit (HOSPITAL_BASED_OUTPATIENT_CLINIC_OR_DEPARTMENT_OTHER): Payer: PRIVATE HEALTH INSURANCE

## 2013-07-09 ENCOUNTER — Ambulatory Visit (HOSPITAL_BASED_OUTPATIENT_CLINIC_OR_DEPARTMENT_OTHER): Payer: PRIVATE HEALTH INSURANCE | Admitting: Ophthalmology

## 2013-07-25 ENCOUNTER — Telehealth (HOSPITAL_BASED_OUTPATIENT_CLINIC_OR_DEPARTMENT_OTHER): Payer: Self-pay | Admitting: Clinic/Center

## 2013-07-27 ENCOUNTER — Ambulatory Visit (HOSPITAL_BASED_OUTPATIENT_CLINIC_OR_DEPARTMENT_OTHER): Payer: PRIVATE HEALTH INSURANCE | Admitting: Physician Assistant

## 2013-07-27 ENCOUNTER — Encounter (HOSPITAL_BASED_OUTPATIENT_CLINIC_OR_DEPARTMENT_OTHER): Payer: Self-pay | Admitting: Physician Assistant

## 2013-07-27 VITALS — BP 120/90 | HR 87 | Temp 98.6°F | Wt 155.5 lb

## 2013-07-27 DIAGNOSIS — R2232 Localized swelling, mass and lump, left upper limb: Secondary | ICD-10-CM

## 2013-07-27 DIAGNOSIS — N644 Mastodynia: Principal | ICD-10-CM

## 2013-07-27 NOTE — Progress Notes (Signed)
S:  52 year old female c/o Pain under R lower rib cage x > 1 week.  Lower R rib anteriorly.  Feels it goes into back and breast.  6-7/10 today.  2-3 days ago not even able to sleep.  Denies recent injury, trauma.  4 y ago had problem breathing on that side s/p fall.  No h/o PE.  Denies f/c, sob, palpitations.  Has dry cough, like when choke on something.  Reports pain triggered by movement and stretching of chest/torso.  She is concerned because she has saline water implant, which she had implanted in 2012.  But 5 months ago doctor had to re open to remove the implant.  Denies personal h/o breast cancer.  Reports FHx cancer - stomach cancer in father.    Also c/o lump in palm of L hand.  Sometimes itch, pins and needles, but no pain and no reduced ROM.    Has seen Clementeen Hoof - both advised it was a corn and that she could return to have injection.        Patient Active Problem List:     Unspecified Anemia     URINARY CALCULUS NOS     Overweight     Pure hypercholesterolemia     FAMILY HX GI MALIGNANCY     Leiomyoma of uterus, unspecified     Vitamin D deficiency     Major depressive disorder     Irritable Bladder     Elevated hemoglobin A1c     Other plastic surgery for unacceptable cosmetic appearance     Rotator cuff tear arthropathy of right shoulder     AC joint arthropathy     Impingement syndrome of right shoulder     Hematest positive stools     Tubular adenoma of colon     Diverticulosis of colon     Elevated blood pressure (not hypertension)     Hyperopia with astigmatism and presbyopia     Immature cataract     Hypertension, goal below 140/90      Review of Patient's Allergies indicates:  No Known Allergies      Current Outpatient Prescriptions on File Prior to Visit:  meloxicam (MOBIC) 7.5 MG tablet Take 1 tablet by mouth 2 (two) times daily. Disp:  Rfl:    cyclobenzaprine (FLEXERIL) 5 MG tablet Take 1 tablet by mouth 2 (two) times daily as needed. Disp:  Rfl:    omeprazole (PRILOSEC) 20  MG capsule TAKE 2 CAPSULE BY MOUTH DAILY. Disp: 60 capsule Rfl: 2   FLUoxetine (PROZAC) 20 MG capsule TAKE 2 CAPSULES BY MOUTH EVERY DAY Disp: 60 capsule Rfl: 11   naproxen (NAPROSYN) 500 MG tablet Take 1 tablet by mouth 2 (two) times daily with meals. Please discontinue last naproxen medication Disp: 28 tablet Rfl: 0   OMEGA 3 1000 MG OR CAPS 1 taily Disp:  Rfl:      No current facility-administered medications on file prior to visit.      O:  BP 120/90   Pulse 87   Temp(Src) 98.6 F (37 C) (Oral)   Wt 155 lb 8 oz (70.534 kg)   BMI 29.4 kg/m2   SpO2 99%   LMP 07/11/2005  General: A&Ox3, NAD.  Breast:  Pt feels R breast is more swollen than left but I did not appreciate a difference.  Well-healed incisions from previous breast augmentation surgery and subsequent reconstruction surgeries.  On exam I did not appreciate any edema, erythema, skin changes other  than the incision sites, lumps or nipple d/c. +TTP along R lower breast and inferior to R breast along R lower rib cage anteriorly.    Hand: L hand palmar aspect, proximal to 3rd finger, hard fixed mass, about 0.5 cm, non-tender, no skin discoloration.  Digits with FROM without pain.  Strength and sensation intact.  Digits wwp.      A/P:  (611.71) Breast pain, right  (primary encounter diagnosis)  Comment: no obvious s/s of infection.  Send to breast center for further eval, it may be related to her multiple surgeries of R breast  Plan: REFERRAL TO BREAST CENTER - [facility] ( INT)            (782.2) Mass of hand, left  Comment: Ganglion cyst or corn  Pt reports that Dr. Margo Aye advised her to return for an injection if the problems persists or worsens.  I explained to pt that the hand is a very delicate area, very small with important neurovascular structures that are at risk of damage if a non-hand specialist injects.  I recommended that she see the hand specialist for possible injection.  Plan: REFERRAL TO ORTHOPEDICS ( INT)      1. We reviewed patient's medications.   Pt reports taking all medications as instructed and finds no barriers to adherence.  2. Allergies were reviewed and updated.  3. Past medical, past surgical, family and social hx reviewed and updated as needed.  4. AVS given to pt.  5. The patient/guardian expressed understanding and no barriers to adherence to plan were identified.    6. Return if sx worsen or fail to improve as anticipated.      I have spent 20 minutes in face to face time with this patient/patient proxy of which > 50% was in counseling or coordination of care regarding above issues/Dx.

## 2013-08-20 ENCOUNTER — Ambulatory Visit (HOSPITAL_BASED_OUTPATIENT_CLINIC_OR_DEPARTMENT_OTHER): Payer: PRIVATE HEALTH INSURANCE | Admitting: Nurse Practitioner

## 2013-08-20 VITALS — BP 120/70 | Temp 98.2°F

## 2013-08-20 DIAGNOSIS — Z803 Family history of malignant neoplasm of breast: Secondary | ICD-10-CM

## 2013-08-20 DIAGNOSIS — Z1239 Encounter for other screening for malignant neoplasm of breast: Secondary | ICD-10-CM

## 2013-08-20 DIAGNOSIS — Z9882 Breast implant status: Principal | ICD-10-CM

## 2013-08-21 NOTE — Progress Notes (Signed)
This office note has been dictated. Account number [id number]

## 2013-08-31 ENCOUNTER — Ambulatory Visit (HOSPITAL_BASED_OUTPATIENT_CLINIC_OR_DEPARTMENT_OTHER): Payer: Self-pay | Admitting: Nurse Practitioner

## 2013-09-06 ENCOUNTER — Encounter (HOSPITAL_BASED_OUTPATIENT_CLINIC_OR_DEPARTMENT_OTHER): Payer: Self-pay

## 2013-09-18 ENCOUNTER — Ambulatory Visit (HOSPITAL_BASED_OUTPATIENT_CLINIC_OR_DEPARTMENT_OTHER): Payer: PRIVATE HEALTH INSURANCE | Admitting: Hand Surgery

## 2013-09-18 ENCOUNTER — Ambulatory Visit (HOSPITAL_BASED_OUTPATIENT_CLINIC_OR_DEPARTMENT_OTHER): Payer: PRIVATE HEALTH INSURANCE | Admitting: Gastroenterology

## 2013-10-01 ENCOUNTER — Ambulatory Visit (HOSPITAL_BASED_OUTPATIENT_CLINIC_OR_DEPARTMENT_OTHER): Payer: PRIVATE HEALTH INSURANCE | Admitting: Family Medicine

## 2013-10-03 ENCOUNTER — Ambulatory Visit (HOSPITAL_BASED_OUTPATIENT_CLINIC_OR_DEPARTMENT_OTHER): Payer: PRIVATE HEALTH INSURANCE | Admitting: Family Medicine

## 2013-10-04 ENCOUNTER — Ambulatory Visit (HOSPITAL_BASED_OUTPATIENT_CLINIC_OR_DEPARTMENT_OTHER): Payer: PRIVATE HEALTH INSURANCE | Admitting: Hand Surgery

## 2013-10-08 ENCOUNTER — Encounter (HOSPITAL_BASED_OUTPATIENT_CLINIC_OR_DEPARTMENT_OTHER): Payer: Self-pay | Admitting: Family Medicine

## 2013-10-18 ENCOUNTER — Ambulatory Visit (HOSPITAL_BASED_OUTPATIENT_CLINIC_OR_DEPARTMENT_OTHER): Payer: PRIVATE HEALTH INSURANCE

## 2013-10-31 ENCOUNTER — Encounter (HOSPITAL_BASED_OUTPATIENT_CLINIC_OR_DEPARTMENT_OTHER): Payer: Self-pay | Admitting: Family Medicine

## 2013-10-31 ENCOUNTER — Ambulatory Visit (HOSPITAL_BASED_OUTPATIENT_CLINIC_OR_DEPARTMENT_OTHER): Payer: PRIVATE HEALTH INSURANCE | Admitting: Family Medicine

## 2013-10-31 ENCOUNTER — Ambulatory Visit (HOSPITAL_BASED_OUTPATIENT_CLINIC_OR_DEPARTMENT_OTHER): Payer: PRIVATE HEALTH INSURANCE | Admitting: Gastroenterology

## 2013-10-31 ENCOUNTER — Encounter (HOSPITAL_BASED_OUTPATIENT_CLINIC_OR_DEPARTMENT_OTHER): Payer: Self-pay | Admitting: Gastroenterology

## 2013-10-31 VITALS — BP 140/90 | HR 79 | Wt 156.0 lb

## 2013-10-31 VITALS — BP 148/100 | HR 87 | Temp 96.6°F | Wt 157.6 lb

## 2013-10-31 DIAGNOSIS — K625 Hemorrhage of anus and rectum: Principal | ICD-10-CM

## 2013-10-31 LAB — COMPREHENSIVE METABOLIC PANEL
ALANINE AMINOTRANSFERASE: 61 U/L — ABNORMAL HIGH (ref 12–45)
ALBUMIN: 4.1 g/dL (ref 3.4–5.0)
ALKALINE PHOSPHATASE: 100 U/L (ref 45–117)
ANION GAP: 10 mmol/L (ref 5–15)
ASPARTATE AMINOTRANSFERASE: 36 U/L — ABNORMAL HIGH (ref 8–34)
BILIRUBIN TOTAL: 0.3 mg/dL (ref 0.2–1.0)
BUN (UREA NITROGEN): 12 mg/dL (ref 7–18)
CALCIUM: 9.6 mg/dL (ref 8.5–10.1)
CARBON DIOXIDE: 24 mmol/L (ref 21–32)
CHLORIDE: 106 mmol/L (ref 98–107)
CREATININE: 0.5 mg/dL (ref 0.4–1.2)
ESTIMATED GLOMERULAR FILT RATE: >60 mL/min (ref >60)
Glucose Random: 93 mg/dL (ref 74–160)
POTASSIUM: 4.3 mmol/L (ref 3.5–5.1)
SODIUM: 140 mmol/L (ref 136–145)
TOTAL PROTEIN: 8.1 g/dL (ref 6.4–8.2)

## 2013-10-31 LAB — CBC, PLATELET & DIFFERENTIAL
ABSOLUTE BASO COUNT: 0 10*3/uL (ref 0.0–0.1)
ABSOLUTE EOSINOPHIL COUNT: 0.1 10*3/uL (ref 0.0–0.8)
ABSOLUTE IMM GRAN COUNT: 0.01 10*3/uL (ref 0.00–0.03)
ABSOLUTE LYMPH COUNT: 3.4 10*3/uL (ref 0.6–5.9)
ABSOLUTE MONO COUNT: 0.6 10*3/uL (ref 0.2–1.4)
ABSOLUTE NEUTROPHIL COUNT: 2.7 10*3/uL (ref 1.6–8.3)
BASOPHIL %: 0.4 % (ref 0.0–1.2)
EOSINOPHIL %: 1.6 % (ref 0.0–7.0)
HEMATOCRIT: 41.7 % (ref 34.1–44.9)
HEMOGLOBIN: 14.1 g/dL (ref 11.2–15.7)
IMMATURE GRANULOCYTE %: 0.1 % (ref 0.0–0.4)
LYMPHOCYTE %: 49.4 % (ref 15.0–54.0)
MEAN CORP HGB CONC: 33.8 g/dL (ref 31.0–37.0)
MEAN CORPUSCULAR HGB: 30.3 pg (ref 26.0–34.0)
MEAN CORPUSCULAR VOL: 89.7 fL (ref 80.0–100.0)
MEAN PLATELET VOLUME: 11.9 fL (ref 8.7–12.5)
MONOCYTE %: 8.2 % (ref 4.0–13.0)
NEUTROPHIL %: 40.3 % (ref 40.0–75.0)
PLATELET COUNT: 289 10*3/uL (ref 150–400)
RBC DISTRIBUTION WIDTH STD DEV: 45 fL (ref 35.1–46.3)
RBC DISTRIBUTION WIDTH: 13.7 % (ref 11.5–14.3)
RED BLOOD CELL COUNT: 4.65 M/uL (ref 3.90–5.20)
WHITE BLOOD CELL COUNT: 6.8 10*3/uL (ref 4.0–11.0)

## 2013-10-31 NOTE — Progress Notes (Signed)
Dict

## 2013-10-31 NOTE — Progress Notes (Signed)
Cc: rectal bleeding    Started on Monday. Not note today. stooling almost every hour which is unusual. Stool is soft, but not liquidy. On Monday was a lot of bright red blood. Having a crampy abdominal pain. 5/10 when feels it. Not feeling it right now. Of note, pain is in epigastric/ruq area. In her mind it is associated with her liver. Stool has varied between black and yellowish. Today it is mostly yellow.  No palpitations. Has been feeling a little tired and weak, but not dizzy. No recent antibiotic use.    No fever  Has been a little sob and with poor sleep.   No vomit    Patient Active Problem List:     Unspecified Anemia     URINARY CALCULUS NOS     Overweight     Pure hypercholesterolemia     FAMILY HX GI MALIGNANCY     Leiomyoma of uterus, unspecified     Vitamin D deficiency     Major depressive disorder     Irritable Bladder     Elevated hemoglobin A1c     Other plastic surgery for unacceptable cosmetic appearance     Rotator cuff tear arthropathy of right shoulder     AC joint arthropathy     Impingement syndrome of right shoulder     Hematest positive stools     Tubular adenoma of colon     Diverticulosis of colon     Elevated blood pressure (not hypertension)     Hyperopia with astigmatism and presbyopia     Immature cataract     Hypertension, goal below 140/90    Social: no recent travel    O:    10/31/13  1151   BP: 148/100   Pulse: 87   Temp: 96.6 F (35.9 C)   TempSrc: Temporal   Weight: 157 lb 9.6 oz (71.487 kg)   SpO2: 98%   pleasant, well appearing, no apparent distress  Mucous membranes moist, anicteric  Regular rate and rhythm no murmurs rubs or gallops  Clear to auscultation bilaterally  Nl bs, soft, non-tender, non-distended, no hepatosplenomegaly  No clubbing cyanosis or edema    A/p: (569.3) Hemorrhage of rectum and anus  (primary encounter diagnosis)  Comment: Gi bleed, known diverticulosis, I strongly counseled her to go to er. Concerned that ongoing black blood is melena.  She however refuses to go to the er. Feels she is not that sick and doesn't want to sit there for a long time. Too busy. Would not agree to go.   Plan: CBC + PLT + AUTO DIFF, COMPREHENSIVE METABOLIC         PANEL, REFERRAL TO GASTROENTEROLOGY ( INT)        Strongly instructed to go to er if bleeding is more frequent, if feels at all dizzy, more weak or palpitations  She voiced clear understanding of plan  She agrees to go to er if significant anemia on lab test    F/u with pcp eary next week if not in to see gi within 1 week  Note cc'd to her pcp and her gi doctor

## 2013-10-31 NOTE — Progress Notes (Signed)
Reviewed and discussed flexible sigmoidoscopy and prep.  Instructions given to pt both verbal and written  Pt verbalized understanding.      Sigmoidoscopia  Flexible Sigmoidoscopy-Portuguese        A sigmoidoscopia (tambm conhecida como flex-sig)  um procedimento usado para examinar o interior do clon e do reto.  Durante uma flex-sig, um tubo flexvel com uma cmera e uma luz  inserido atravs do reto para examinar a parte inferior do intestino grosso (tambm chamado o intestino grosso ou clon). Por favor, note que o sigmoidoscpio flexvel no chega a ver a parte superior do clon e, portanto, no consegue examinar nenhum problema nesta rea.    A sigmoidoscopia flexvel pode detectar tecidos inflamados, lceras, hemorridas e tumores anormais chamados plipos. Alguns plipos so malignos, mas a maioria so "pr-cancerosos". Estes podem tornar-se perigosos um dia. O mdico geralmente pode remover esses plipos durante o exame. Eles ento so enviados ao laboratrio para verificao. Os plipos so comuns em adultos e, geralmente, so inofensivos; no entanto, a [employer] cnceres colorretais comeam como plipos. Remov-los  uma boa forma de prevenir o cncer. O mdico talvez retire algumas bipsias (pequenos fragmentos de tecido) para Journalist, newspaper de anormalidades que ele [place] verificar mais detalhadamente.    Preparao:      Voc pode jantar normalmete no dia anterior ao exame.     Voc pode tomar lquidos transparentes at 4 horas antes do exame.            (Ch, gua, suco transparente ou caldo).     Se tiver alguma dvida por favor converse com seu mdico ou com o pessoal da enfermagem.   extremamente importante seguir todos os passos da limpeza do clon .  Se voc no seguir todos os passos, o mdico pode no conseguir examinar com clareza. O exame poder ser anulado e repetido outro dia. Os lquidos transparentes que voc pode tomar durante a limpeza so encarados como alimento por ser  organismo, por isso voc no passar fome nem ficar desidratado.     O mdico indicar qual  a preparao que voc dever tomar. A preparao para o exame consiste no pedido para fazer dois fleets enemas.  Faa o primeiro 2 horas antes de [place], o segundo uma 1 hora antes da chegada. Voc pode obter esses em [employer] (voc no precisa de receita mdica). Voc far estes logo antes do exame para a limpeza total do reto e do clon inferior . Em alguns casos, nenhuma preparao  necessria e voc no ter que tomar nenhum laxativo nem fazer enemas.      Preparao  Voc no deve tomar Ibuprofeno, Motrin, Advil, Naprosyn, Aleve  ou nenhum outro antiinflamatrio no esteride por uma semana antes do exame. O Tylenol pode ser tomado para alvio de dor moderada. Se voc toma anticoagulantes sanguneos (tais como Aspirina, [place], Heparina, Coumadin ou Plavix) voc deve perguntar ao mdico ou enfermeiro antes de interromper estes medicamentos!!! Isso  especialmente importante se voc toma esses medicamentos para doenas cardacas, acidente vascular cerebral, embolia pulmonar, vlvulas cardacas artificiais, stent cardaco ou vascular e qualquer outros problemas que poderiam levar a uma grave formao de cogulo, acidente vascular cerebral ou ataque cardaco.    Sua medicao:  Na manh do exame, voc pode tomar seus medicamentos importantes (medicao de presso arterial, anticonvulsivantes, medicao cardaca, medicao da tireide, Social research officer, government) no horrio habitual com um pequeno gole de gua.      Preparao No Hospital    No dia do exame,  um enfermeiro pedir algumas informaes sobre seu histrico mdico.  Voc vestir um roupo do hospital.  Stephanie LaniusEmbora seja raro, em alguns casos uma pequena agulha intravenosa ser inserida na parte de trs de sua mo ou em seu antebrao para administrarmos medicamentos que o deixaro confortvel durante o exame. Por favor, note que no poderemos administrar medicamentos sedativos  se voc tiver comido 8 horas antes do exame. Se seu mdico falou sobre sedao por linha intravenosa, no coma nem beba mais nada aps a meia-noite.    Na sala do procedimento, voc ser solicitado a deitar-se do lado esquerdo, em uma posio fetal.  Por favor, informe ao mdico se esta posio for desconfortvel para voc.  Em geral, o teste leva de 5 a 15 minutos, s vezes mais se houver a necessidade de remoo de plipos ou se notarem outras anormalidades.      s vezes, voc talvez sinta uma sensao de clica. Estaremos monitorando e tratando de deix-lo o mais confortvel possvel. O tubo ser inserido no nus (parte de trs) e Stephanie Boresavanado atravs do reto e do intestino grosso.  O mdico procurar examinar todas as paredes internas da parte inferior de seu clon. A parede externa e os rgos fora do clon no so visveis neste exame e nem a parte superior do clon.  Este exame analisa apenas a ltima parte do clon e do reto.    Possveis Complicaes    As complicaes so raras durante ou aps o exame, porm podem ocorrer. Os riscos mais comuns incluem perfurao intestinal (um rompimento no clon) sangramento, problemas respiratrios, problemas de presso arterial, problemas cardacos, desconforto e reaes adversas  medicao utilizada.  Uma perfurao pode resultar na necessidade de uma cirurgia de emergncia e uma bolsa de colostomia. Esteja ciente tambm que a sigmoidoscopia flexvel, como outros exames mdicos, no  perfeita. Ela pode falhar em ver problemas como plipos, cnceres ou outras doenas, principalmente se estas ocorrerem na parte superior do clon. Felizmente, o risco de todos esses problemas combinados  pequeno.    Aps o exame:    Se voc no receber medicao, voc poder ir para casa sozinho.    Voc talvez sinta-se inchado devido ao ar que foi colocado no clon durante o exame. Caso contrrio, voc poder ir para casa ou ao trabalho.    Voc talvez sinta um pouco de sonolncia se  recebeu alguma medicao. (Se for Halliburton Companyeste o caso, voc no poder operar maquinrio pesado (como um carro) nem fazer nenhum trabalho importante pelo resto do dia. Voc deve planejar repousar, assistir televiso ou fazer uma leitura fcil aps o exame. Voc talvez no se recorde do que ocorreu durante ou imediatamente aps o exame, por isso traga um acompanhante para lembr-lo de todas as instrues fornecidas. Se recebeu medicao, voc dever ter um acompanhante que o leve de volta  casa aps o exame.  Mais uma vez, no realizaremos o exame se voc no tiver um acompanhante para lev-lo de volta  casa     Voc ficar no hospital de 30 minutos  1 hora (embora algumas vezes leve mais tempo).  Garantiremos que voc esteja se sentindo muito bem antes da alta.  Na [facility] parte das vezes, a sigmoidoscopia  feita sem medicamentos. Se por algum motivo voc receber sedao endovenosa voc deve providenciar um acompanhante para lev-lo de volta  casa aps o exame. Mais uma vez, no realizaremos o exame se voc no tiver um acompanhante para lev-lo de Emergency planning/management officervolta  casa. Voc no poder ir para  casa em um txi ou um nibus a menos que o teste seja feito sem sedao.    A Sigmoidoscopia flexvel  um exame seguro e eficaz que  normalmente realizado em nossas instalaes. Voc talvez receba um telefonema de lembrete com a data e a hora de seu exame. Se tiver alguma pergunta, por favor sinta-se  vontade para telefonar:     Para perguntas sobre o prprio exame, telefone no [id number].    Para perguntas sobre a data e a hora de seu exame, ou para alterar a data ou a hora, telefone no [id number].     Para perguntas sobre sua medicao de rotina ou problemas mdicos, por favor, telefone para seu mdico.

## 2013-10-31 NOTE — Progress Notes (Signed)
Patient feels safe at home.

## 2013-11-05 NOTE — Progress Notes (Signed)
Date of Service: 10/31/2013    [name] was seen in urgent consultation at the request of Dr. Katy Apo on October 31, 2013 for rectal bleeding.    HISTORY OF PRESENT ILLNESS:  The patient is a 53 year old woman who had 2 weeks of diarrhea and then developed some blood in the stools 2 days ago.  The bleeding occurred twice with each bowel movement 2 days ago.  Today she had 5 bowel movements that are loose.  At the onset of the diarrhea she was having 6-7 loose bowel movements a day.  She has rectal itching and a sensation of a lump in her anal region, which is painful.  She denies having fever or chills.  The amount of blood was significant only on 1 occasion.  Her hematocrit which was checked today was normal.  She had a colon polyp in 2013.  She gained 12 pounds in the last year.    PAST MEDICAL HISTORY:  Colon polyp, diverticulosis, depression, hypercholesterolemia, leiomyoma of uterus, right shoulder pain.    ALLERGIES:  No known allergies.    MEDICATIONS:  Prilosec, Prozac, and omega 3.    PHYSICAL EXAMINATION:  GENERAL:  She is healthy looking and in no distress.  VITAL SIGNS:  Blood pressure 140/90, pulse 79, oxygen saturation 100%, and weight 156 pounds.  HEAD AND ENT:  No icterus, pallor, or mouth lesions.  ABDOMEN:  Shows no organomegaly, masses, or tenderness.    ASSESSMENT AND PLAN:  This patient had diarrhea for a couple of weeks and as the diarrhea started to improve she had blood in the stools for the last couple of days.  She has had a colonoscopy in 2013, which showed polyps.  In view of the presence of a tender and itchy anal lump, the most likely cause of the bleeding is a hemorrhoid and since she had a colonoscopy in 2013, the investigation will start with just a flexible sigmoidoscopy.    ___________________________  Reviewed and Electronically Signed By: Junius Argyle MD  Sig Date: 11/12/2013  Sig Time: 12:51:15  Dictated By: Junius Argyle MD  Dict Date: 11/04/2013 Dict Time: 09 37 PM    Dictation Date  and Time:11/04/2013 21:37:10  Transcription Date and Time:11/05/2013 00:18:52  eScription Dictation id: [id number] Confirmation # :[id number]      cc: Orie Fisherman MD

## 2013-11-06 ENCOUNTER — Ambulatory Visit (HOSPITAL_BASED_OUTPATIENT_CLINIC_OR_DEPARTMENT_OTHER): Payer: PRIVATE HEALTH INSURANCE | Admitting: Gastroenterology

## 2013-11-07 ENCOUNTER — Ambulatory Visit (HOSPITAL_BASED_OUTPATIENT_CLINIC_OR_DEPARTMENT_OTHER): Payer: PRIVATE HEALTH INSURANCE

## 2013-11-16 ENCOUNTER — Ambulatory Visit (HOSPITAL_BASED_OUTPATIENT_CLINIC_OR_DEPARTMENT_OTHER)
Admit: 2013-11-16 | Disposition: A | Discharge status: Home / Self Care | Payer: Self-pay | Source: Ambulatory Visit | Attending: Gastroenterology | Admitting: Gastroenterology

## 2013-11-16 ENCOUNTER — Encounter (HOSPITAL_BASED_OUTPATIENT_CLINIC_OR_DEPARTMENT_OTHER): Payer: Self-pay | Admitting: Gastroenterology

## 2013-11-16 MED ORDER — FENTANYL CITRATE 0.05 MG/ML IJ SOLN
INTRAMUSCULAR | Status: DC
Start: 2013-11-16 — End: 2013-11-16
  Filled 2013-11-16: qty 4

## 2013-11-16 MED ORDER — LIDOCAINE HCL 2 % EX GEL
CUTANEOUS | Status: DC
Start: 2013-11-16 — End: 2013-11-16
  Filled 2013-11-16: qty 5

## 2013-11-16 MED ORDER — MIDAZOLAM HCL 5 MG/5ML IJ SOLN
INTRAMUSCULAR | Status: DC
Start: 2013-11-16 — End: 2013-11-16
  Filled 2013-11-16: qty 10

## 2013-11-16 MED ORDER — SODIUM CHLORIDE 0.9 % IV SOLN
INTRAVENOUS | Status: DC
Start: 2013-11-16 — End: 2013-11-16
  Filled 2013-11-16: qty 500

## 2013-11-16 NOTE — H&P (Signed)
No sedation  No H&P needed

## 2013-11-16 NOTE — Discharge Instructions (Signed)
[facility] DISCHARGE INSTRUCTIONS     Quando voc for para casa  possvel que se sinta sonolento(a). Descanse bastante pelo resto do dia.   When you return home you may feel sleepy. Get plenty of rest for the remainder of the day.     Se voc tiver recebido algum sedative para o procedimento NO DIRIJA, NO  MANUSEIE MQUINAS NEM TOME NENHUMA DECISO IMPORTANTE durante o resto do dia.  If you received sedation for your procedure DO NOT DRIVE, OPERATE MACHINERY, OR MAKE IMPORTANT DECISIONS for the remainder of the day.     Depois ter feito uma COLONOSCOPIA  normal ter gases e sentir inchao abdominal, mas se voc tiver uma DOR ABDOMINAL SEVERA entre em contato com o seu mdico imediatamente.   It is normal after having a COLONOSCOPY to feel a little gassy and bloated, but if you develop SEVERE ABDOMINAL PAIN call your doctor immediately.      Depois ter feito uma COLONOSCOPIA  normal ver uma pequena quantidade de sangue aps as primeiras evacuaes, mas se voc vir uma QUANTIDADE GRANDE DE SANGUE VERMELHO VIVO, entre em contato com o seu mdico imediatamente.  It is normal after having a COLONOSCOPY to see a small amount of blood after your first few bowel movements, but, if you see a LARGE AMOUNT OF BRIGHT RED BLOOD, call your doctor immediately.     Aps ter feito uma ENDOSCOPIA DIGESTIVA ALTA (GASTROENTEROSCOPIA)      Se tiver feito uma bipsia ou Polipectomia voc ter que suspender a aspirina ou os medicamentos que contenham aspirina por {NUMBERS 6-64:40347} dias.                  If you had a biopsy or Polypectomy you will need to hold aspirin or medication containing aspirin for ___days.     .     Se voc tiver feito uma bipsia ou Polipectomia voc ter que suspender qualquer medicamento tais como Advil, Motrin, Naproxin, Ibuprofen etc por {NUMBERS 1-12:19994} dias.  If you had a biopsy or Polypectomy you will need to hold any medication such as  Advil, Motrin, Naproxin, Ibuprofen etc for___days.     Entre em contato com o seu mdico se houver qualquer outro sintoma fora do normal.  Call you physician for any other unusual symptoms.     Se por qualquer razo voc no conseguir entrar em contato com o seu mdico v para a sala de Education officer, museum prxima.   If for any reason you are unable to reach your doctor go to the nearest Emergency Room.    Instrues Especficas:***  Specific Instructions:  Sigmoidoscopia flexvel  (Flexible Sigmoidoscopy)  Seu mdico solicitou uma sigmoidoscopia flexvel.  um exame para avaliar o clon inferior. Neste exame, o clon  limpo e uma fibra tica  inserida atravs do reto, para dentro do clon. O endoscpio  um pequeno conjunto de pequenas fibras de vidro, muito flexveis, que transmitem luz  rea examinada e imagens daquela rea para seu mdico. Voc no precisa se preocupar com quebra de vidro no endoscpio. O desconforto geralmente  mnimo, se houver, e sedativos e geralmente no so necessrios medicamentos para a dor. Este exame ajuda a detectar tumores, plipos, inflamao e reas de sangramento. Tambm pode ser usado para retirar bipsias (um pedao pequeno de tecido extrado para exame sob o microscpio).  INFORME SEU [place]:   Alergias.   Medicamentos tomados incluindo ervas, colrios, medicamentos de  uso livre, e cremes.   Uso de corticides (orais ou cremes).   Problemas anteriores com anestsicos ou novocana.   Possibilidade de [place] se For o caso.   Histrico de cogulos de sangue (tromboflebite).   Histrico de sangramento ou problemas sangneos.   Cirurgia prvia.   Outros problemas de sade.  ANTES DO PROCEDIMIENTO  Coma normalmente na noite anterior ao exame. Seu mdico pode recomendar um enema suave ou laxante na noite anterior. No se deve tomar mais nada via oral depois da meia-noite, at o trmino do procedimento. Pode ser ministrado um supositrio retal ou enemas  pela manh, antes de realizar o exame, ou conforme recomendado pelo mdico. Voc ser conduzido  rea do exame vestindo uma bata hospitalar.  Voc deve estar presente 60 minutos antes do procedimento.   APS O PROCEDIMENTO   Ocasionalmente h um pouco de sangue que passa com o primeiro movimento do intestino. No fique preocupado. Durante o exame, h passagem de quantidades moderadas de ar, o que pode originar surjam clicas abdominais aps o procedimento. Andar ou aplicar compressa quente no abdmen ajudam o problema. No durma com uma compressa quente, pois pode causar queimaduras.   Retome seus hbitos alimentares e atividades normalmente.   S tome no balcao ou medicinas de receita para dor, incmodo, ou febre como dirigido por seu mdico. No use aspirina nem afinadores de sangue se foi realizada uma bipsia. Consulte seu mdico quanto ao uso de medicamentos caso, tenham sido feitas bipsias.   Telefone para obter seus resultados conforme instrudo pelo seu mdico. [place] de que  sua responsabilidade obter os resultados de [place] bipsia. No suponha que tudo est bem s porque no tem notcias de seu mdico.  PROCURE UM MDICO IMEDIATAMENTE SE:   Se a temperatura oral ultrapassar 38,9 C (102 F).   Se tiver cogulos grandes de sangue ou encher a privada com sangue aps o procedimento. Isto tambm pode ocorrer de 10 a 14 dias aps o procedimento e  mais provvel que ocorra aps uma biopsia.   Se desenvolver dor abdominal que no  [place] pelo medicamento ou que piora em vez de Careers information officer.  Document Released: 08/16/2005 Document Revised: 11/08/2011  [facility] Patient Information 2014 [place], [place].Post Procedure Diagnosis:   - Diverticulosis in the sigmoid colon.   - Hemorrhoids.   Complications: No immediate complications.   Recommendation:   - Return to GI clinic PRN.   Junius Argyle, MD   11/16/2013 11:09 AM

## 2013-11-16 NOTE — PROVATION-GI (Signed)
[facility]  Patient Name: Stephanie [name]  MRN: [id number]  Account Number: [id number]  Gender: Female  Age: 53  Date of Birth: 31-Dec-1960  Admit Type: Outpatient  Patient Location: [facility]  Note Status: Finalized  Referring MD:         Arlan Organ  Procedure Date:       11/16/2013 10:49:32 AM  Procedure:            Flexible Sigmoidoscopy  Endoscopist:          Junius Argyle, MD  Indications for Procedure:       Rectal hemorrhage  Medications:          Lidocaine gel  Procedure:       After obtaining informed consent, the endoscope was passed under direct        vision. Throughout the procedure, the patient's blood pressure, pulse, and        oxygen saturations were monitored continuously. The PCF-Q180AL was introduced        through the anus and advanced to the sigmoid colon. The flexible        sigmoidoscopy was accomplished without difficulty. The patient tolerated the        procedure well. The quality of the bowel preparation was good.  Findings:       Diverticula were found in the sigmoid colon.       Hemorrhoids were found during retroflexion.  Post Procedure Diagnosis:       - Diverticulosis in the sigmoid colon.       - Hemorrhoids.  Complications:        No immediate complications.  Recommendation:       - Return to GI clinic PRN.  Junius Argyle, MD  11/16/2013 11:09 AM  This report has been signed electronically.  Number of Addenda: 0  Note Initiated On: 11/16/2013 10:49 AM

## 2013-12-07 ENCOUNTER — Encounter (HOSPITAL_BASED_OUTPATIENT_CLINIC_OR_DEPARTMENT_OTHER): Payer: Self-pay | Admitting: Family Medicine

## 2013-12-07 ENCOUNTER — Ambulatory Visit (HOSPITAL_BASED_OUTPATIENT_CLINIC_OR_DEPARTMENT_OTHER): Payer: PRIVATE HEALTH INSURANCE | Admitting: Family Medicine

## 2013-12-07 VITALS — BP 130/80 | HR 105 | Temp 97.8°F | Wt 148.8 lb

## 2013-12-07 DIAGNOSIS — R2232 Localized swelling, mass and lump, left upper limb: Principal | ICD-10-CM

## 2013-12-07 MED ORDER — OMEPRAZOLE 20 MG PO CPDR
20.0000 mg | DELAYED_RELEASE_CAPSULE | Freq: Every day | ORAL | Status: DC
Start: 2013-12-07 — End: 2014-02-12

## 2013-12-07 NOTE — Progress Notes (Signed)
SUBJECTIVE  Stephanie [name] is a 53 yo woman who presents with complaint of persistent L hand mass first noted 05/2013.  Mass thought to be a corn at the time however persisted and pt was then seen   Pt reports this is not tender but she has now developed 07/27/13;  She was referred to hand surgeon for eval and possible treatment but pt reports due to weather she missed appt;  She reports she has now developed another mass this time occuring near the base of her L 4th digit; this is painful and rated 5/10;  She would like to reschedule with specialist;  She denies decline in ROM, paresthesias or numbness;    Patient Active Problem List:     Unspecified Anemia     URINARY CALCULUS NOS     Overweight     Pure hypercholesterolemia     FAMILY HX GI MALIGNANCY     Leiomyoma of uterus, unspecified     Vitamin D deficiency     Major depressive disorder     Irritable Bladder     Elevated hemoglobin A1c     Other plastic surgery for unacceptable cosmetic appearance     Rotator cuff tear arthropathy of right shoulder     AC joint arthropathy     Impingement syndrome of right shoulder     Hematest positive stools     Tubular adenoma of colon     Diverticulosis of colon     Elevated blood pressure (not hypertension)     Hyperopia with astigmatism and presbyopia     Immature cataract     Hypertension, goal below 140/90    OBJECTIVE  BP 130/80   Pulse 105   Temp(Src) 97.8 F (36.6 C) (Oral)   Wt 148 lb 12.8 oz (67.495 kg)   BMI 28.83 kg/m2   SpO2 98%   LMP 07/11/2005  O: NAD  Appearance: in no apparent distress and well developed and well nourished.  Hand exam: small 0.3 cm subcutaneous mass along palmer aspect of L 4th digit proximal to MCP.  TTP, without surrounding erythema;  1 cm rough mass NTTP, 3 cm away from MCP of 4th L digit;  There is no swelling, instability; ligaments intact, FROM all hand, wrist, finger joints. Radial pulse intact, sensation intact;    A/p  (782.2) Mass of hand, left  (primary encounter  diagnosis)  Comment: pt now with two left hand masses;  Second mass does not appear to be a callous; will re-refer to hand surgeon; suggested warm compresses and tylenol prn pain; pt to call if symptoms worsen or if she does not receive a call for her appt;  Plan: REFERRAL TO ORTHOPEDICS ( INT)

## 2013-12-07 NOTE — Patient Instructions (Signed)
For pain may take 1-2 tablets of the 325 mg every 6 hours as needed for pain.    May try also 500 mg (extra strength) every 6 hours as needed for pain;    Pick one only

## 2013-12-14 ENCOUNTER — Encounter (HOSPITAL_BASED_OUTPATIENT_CLINIC_OR_DEPARTMENT_OTHER): Payer: Self-pay | Admitting: Family Medicine

## 2013-12-18 ENCOUNTER — Ambulatory Visit (HOSPITAL_BASED_OUTPATIENT_CLINIC_OR_DEPARTMENT_OTHER): Payer: PRIVATE HEALTH INSURANCE | Admitting: Gastroenterology

## 2014-01-15 ENCOUNTER — Encounter (HOSPITAL_BASED_OUTPATIENT_CLINIC_OR_DEPARTMENT_OTHER): Payer: Self-pay | Admitting: Family Medicine

## 2014-01-30 ENCOUNTER — Other Ambulatory Visit (HOSPITAL_BASED_OUTPATIENT_CLINIC_OR_DEPARTMENT_OTHER): Payer: Self-pay

## 2014-02-12 ENCOUNTER — Other Ambulatory Visit (HOSPITAL_BASED_OUTPATIENT_CLINIC_OR_DEPARTMENT_OTHER): Payer: Self-pay | Admitting: Family Medicine

## 2014-02-12 MED ORDER — OMEPRAZOLE 20 MG PO CPDR
20.0000 mg | DELAYED_RELEASE_CAPSULE | Freq: Every day | ORAL | Status: DC
Start: 2014-02-12 — End: 2014-05-20

## 2014-02-12 NOTE — Progress Notes (Signed)
PER Patient (self), Stephanie [name] is a 53 year old female has requested a refill of omeprazole 20 MG.      Last Office Visit: 12/07/2013  Last Physical Exam: 02/21/2013      Other Med Adult:  Most Recent BP Reading(s)  12/07/13 : 130/80        Cholesterol (mg/dl)   Date  Value    05/08/2012  261*   ----------    LDL DIRECT (mg/dl)   Date  Value    05/08/2012  149*   ----------    HDL (mg/dl)   Date  Value    05/08/2012  59    ----------    TRIGLYCERIDES (mg/dl)   Date  Value    01/20/2009  88    ----------        THYROID SCREEN TSH REFLEX FT4 (uIU/mL)   Date  Value    02/21/2013  1.030    ----------        TSH (THYROID STIM HORMONE) (uIU/mL)   Date  Value    06/17/2011  0.92    ----------      HEMOGLOBIN A1C (%)   Date  Value    05/08/2012  6.0*   ----------        INR (no units)   Date  Value    04/22/2008  1.0*    02/07/2005  1.0*   ----------       Documented patient preferred pharmacies:    [facility] ([facility])  Phone: [phone] Fax: [phone]

## 2014-02-12 NOTE — Progress Notes (Addendum)
.  [facility]    Person calling on behalf of patient: Patient (self)    May list multiple medications in this section    Medicine Name: FLUoxetine (PROZAC) 20 MG capsule, omeprazole (PRILOSEC) 20 MG capsule    Dosage:     Frequency (how many pills, how many times a day):     Number of pills left:     Documented patient preferred pharmacies:   [facility] ([facility])  Phone: [phone] Fax: [phone]      Pharmacy Name:     Pharmacy Telephone Number:     Pharmacy  Fax Number:     CALL BACK NUMBER:     Cell phone:      Other phone:     Available times:     Patient's language of care: [place]    Patient needs a [place] interpreter.

## 2014-02-12 NOTE — Progress Notes (Signed)
Med on file with Pharmacy:  [facility] confirmed that FLUoxetine 20 MG has active refills remaining. No refill is required at this time.

## 2014-02-15 ENCOUNTER — Other Ambulatory Visit (HOSPITAL_BASED_OUTPATIENT_CLINIC_OR_DEPARTMENT_OTHER): Payer: Self-pay | Admitting: Licensed Practical Nurse

## 2014-02-15 ENCOUNTER — Ambulatory Visit (HOSPITAL_BASED_OUTPATIENT_CLINIC_OR_DEPARTMENT_OTHER): Payer: PRIVATE HEALTH INSURANCE | Admitting: Hand Surgery

## 2014-02-15 VITALS — BP 144/88 | HR 76 | Temp 98.0°F | Resp 18 | Ht 61.42 in | Wt 146.0 lb

## 2014-02-15 DIAGNOSIS — R2232 Localized swelling, mass and lump, left upper limb: Principal | ICD-10-CM

## 2014-02-15 DIAGNOSIS — R2230 Localized swelling, mass and lump, unspecified upper limb: Secondary | ICD-10-CM

## 2014-02-15 HISTORY — DX: Localized swelling, mass and lump, unspecified upper limb: R22.30

## 2014-02-15 LAB — XR HAND LEFT MINIMUM 3 VIEWS

## 2014-02-15 NOTE — Progress Notes (Signed)
CC: Patient presents with:  Hand Pain: cyst vs corn on palmar aspect of L hand near MCP of 4th digit      HPI: Stephanie [name] is a 53 year old RHD female presenting with 4 months of nodule in the palm of left side, no injury.  She reports no real pain just and discomfort.  This discomfort is worse with direct pressure and flexion of her fingers.  She denies locking of her fingers, numbness or tingling.    Re: HPI, see also scanned New Patient form for additional info provided by patient    ROS: Review of systems filled out by patient and scanned into the medical record. Reviewed by me and all other systems are negative except as noted in HPI.    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; 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     Social History Main Topics   Smoking status: Former Smoker     Types: Cigarettes    Quit date: 07/27/2001    Smokeless tobacco: Not on file    Alcohol Use: Yes  10.0 oz/week     Comment: occasional, weekends    Drug Use: No    Sexual Activity: Not Currently    Partners: Male    Comment: G3P3, no hx STD, no hx abn Pap     Other Topics Concern   None on file     Social History Narrative       Allergies: Review of Patient's Allergies indicates:  No Known Allergies    Current Medications: Current outpatient prescriptions:omeprazole (PRILOSEC) 20 MG capsule, Take 1 capsule by mouth daily., Disp: 30 capsule, Rfl: 6;  FLUoxetine (PROZAC) 20  MG capsule, TAKE 2 CAPSULES BY MOUTH EVERY DAY, Disp: 60 capsule, Rfl: 11;  OMEGA 3 1000 MG OR CAPS, 1 taily, Disp: , Rfl:     Re: past history, social history, meds and allergies, see also scanned New Patient form for additional info provided by patient    Physical Exam    02/15/14  1536   BP: 144/88   Pulse: 76   Temp: 98 F (36.7 C)   Resp: 18   Height: 5' 1.42" (1.56 m)   Weight: 146 lb (66.225 kg)   SpO2: 100%     General Appearance: Appropriate.  Alert and oriented x3, pleasant and cooperative  Mood: Appropriate.  Upper Extremities:   Right UE: Normal with regards to inspection, palpation, ROM, stability, and strength. No skin changes. All tendons grossly intact and functioning, neuro intact, vascular intact.   Left UE: Skin is intact.  There is a small nodule at the mid palm, distal to the MP joint the fourth finger.  It is  soft, nontender to palpation and minimal mobile.  It is not appeared to be thickening of the fascia.  There is no locking of the finger with range of motion or pain with range of motion.  All tendons grossly intact and functioning, neuro intact, vascular intact.   Gait/Posture: Normal.   Coordination and balance: grossly normal    Imaging - Available images visualized by me and reports reviewed. No fractures, dislocations or loose bodies seen.    A/P: 53 year old female with ganglion cyst of the left hand in the palm.  We explained that this is benign and there is no indication to surgically remove it unless it is bothering her.  She does not wish to do anything at this time.  We did explain that there is a possibility that this could be early Dupuytren's contracture so if she notices an increase in size or a deformity of the palm that she should come back to the office for repeat exam. Telephone portuguese interpretor was used for the duration of the office visit.    We reviewed the likely diagnosis, the prognosis, and various treatment options in detail.   Risks and benefits of  treatment plan discussed.   Patient's questions have been answered, patient understands condition and agrees with treatment plan.     Dr. Joylene John saw and examined the patient and formulated the assessment and plan.    [name], PA-C, 02/15/2014, 6:30 PM

## 2014-02-15 NOTE — Progress Notes (Signed)
Date of Service: 02/15/2014    DIAGNOSIS:  Nodule on left hand.    She has a small nodule on her left hand which is most likely just a little cyst, but it could possibly be the beginning of Dupuytren's contracture.  Either way nothing needs to be done, and I would like to see her back on a p.r.n. basis if this changes in size.  Please see the full note of Salvadore Dom, PA.  I, Dr. Fredderick Severance, examined this patient and formulated the plan of care.    ___________________________  Reviewed and Electronically Signed By: Estrella Deeds MD  Sig Date: 06/24/2014  Sig Time: 11:49:12  Dictated By: Estrella Deeds MD  Dict Date: 02/15/2014 Dict Time: 04 15 PM    Dictation Date and Time:02/15/2014 16:15:13  Transcription Date and Time:02/15/2014 18:46:57  eScription Dictation id: [id number] Confirmation # :[id number]      cc: Orie Fisherman MD

## 2014-02-15 NOTE — Patient Instructions (Signed)
Cisto sinovial   (Ganglion Cyst)   O cisto sinovial  um aglomerado preenchido com fluido, no canceroso, que ocorre prximo s articulaes ou tendes. O cisto sinovial cresce para fora da articulao ou do revestimento de um tendo. Ele se desenvolve com mais frequncia na mo ou pulso, mas pode tambm se desenvolver no ombro, cotovelo, quadril, joelho, tornozelo ou p. O gnglio redondo ou oval pode ser do Museum/gallery conservator de uma ervilha ou maior que [place]. O aumento de exerccios pode aumentar o tamanho do cisto porque mais fluido comea a se formar.   CAUSAS   No  completamente conhecido o que faz um cisto sinovial crescer. Contudo, pode estar relacionado a:    Inflamao ou irritao em torno Surveyor, quantity.   Uma leso.   Movimentos repetitivos ou exagerados.   Artrite.  SINTOMAS   O aglomerado aparece com mais frequncia na mo ou pulso, mas pode ocorrer em outras reas do corpo. Geralmente, o aglomerado  indolor sem outros sintomas. Contudo, algumas vezes, a dor pode ser sentida durante exerccios ou quando  aplicada presso ao aglomerado. O aglomerado pode at [employer]. Formigamento, dor, dormncia ou fraqueza muscular podem ocorrer se o cisto sinovial pressionar [employer]. Sua fora de aperto pode diminuir e voc pode ter menos movimentos em suas articulaes.   DIAGNSTICO   O cisto sinovial  mais frequentemente diagnosticado com base em um exame fsico, observando onde o cisto est e como ele se parece. O mdico sentir o aglomerado e pode aplicar uma luz atravs dele. Se for um gnglio, a luz brilha atravs dele. O mdico pode solicitar um raio-X, ultrassom ou IRM para eliminar outras condies.   TRATAMENTO   Os gnglios normalmente desaparecem por si sem tratamento. Se a dor ou outros sintomas so envolvidos, o tratamento pode ser necessrio. O tratamento tambm  necessrio se o gnglio limita seu movimento ou se ele ficar infectado. As opes de tratamento incluem:    Vestir uma  faixa ou tala para o pulso ou dedo.   Tomar medicamentos anti-inflamatrios.   Drenar o fluido do aglomerado com uma agulha (aspirao).   Injetar um esteroide na articulao.   A cirurgia para remover o cisto sinovial e sua haste que  conectada  articulao ou tendo. Contudo, os cistos sinoviais voltam a crescer.  INSTRUES PARA TRATAMENTO DOMICILIAR    No pressione o gnglio, cutuque com [place], ou aperte com um objeto pesado. Voc pode esfregar o aglomerado gentilmente e com frequncia. Algumas vezes, o fluido se move para fora do cisto.   Tome medicamentos conforme as orientaes de seu mdico.   Vista faixa ou tala conforme instrudo pelo seu mdico.  PROCURE UM MDICO SE:    O gnglio ficar maior ou mais dolorido.   Tiver um [employer] da vermelhido, listras vermelhas ou inchao.   Tiver pus expelido do aglomerado.   Tiver fraqueza ou dormncia na rea afetada.  CERTIFIQUE-SE DE:    Compreender estas instrues.   Observar as suas condies.   Procurar um mdico imediatamente se no se sentir bem ou piorar.  Document Released: 05/26/2005 Document Revised: 05/10/2012  [facility] Patient Information 2014 [place], [place].

## 2014-03-21 ENCOUNTER — Encounter (HOSPITAL_BASED_OUTPATIENT_CLINIC_OR_DEPARTMENT_OTHER): Payer: Self-pay

## 2014-03-29 ENCOUNTER — Encounter (HOSPITAL_BASED_OUTPATIENT_CLINIC_OR_DEPARTMENT_OTHER): Payer: Self-pay

## 2014-04-25 ENCOUNTER — Ambulatory Visit (HOSPITAL_BASED_OUTPATIENT_CLINIC_OR_DEPARTMENT_OTHER): Payer: PRIVATE HEALTH INSURANCE

## 2014-05-20 ENCOUNTER — Ambulatory Visit (HOSPITAL_BASED_OUTPATIENT_CLINIC_OR_DEPARTMENT_OTHER): Payer: PRIVATE HEALTH INSURANCE | Admitting: Family Medicine

## 2014-05-20 ENCOUNTER — Encounter (HOSPITAL_BASED_OUTPATIENT_CLINIC_OR_DEPARTMENT_OTHER): Payer: Self-pay | Admitting: Family Medicine

## 2014-05-20 ENCOUNTER — Telehealth (HOSPITAL_BASED_OUTPATIENT_CLINIC_OR_DEPARTMENT_OTHER): Payer: Self-pay | Admitting: Family Medicine

## 2014-05-20 VITALS — BP 128/80 | HR 88 | Temp 96.8°F | Wt 154.8 lb

## 2014-05-20 DIAGNOSIS — Z23 Encounter for immunization: Secondary | ICD-10-CM

## 2014-05-20 DIAGNOSIS — F329 Major depressive disorder, single episode, unspecified: Secondary | ICD-10-CM

## 2014-05-20 DIAGNOSIS — K2100 Gastro-esophageal reflux disease with esophagitis, without bleeding: Secondary | ICD-10-CM

## 2014-05-20 DIAGNOSIS — R42 Dizziness and giddiness: Secondary | ICD-10-CM

## 2014-05-20 DIAGNOSIS — F32A Depression, unspecified: Secondary | ICD-10-CM

## 2014-05-20 DIAGNOSIS — E559 Vitamin D deficiency, unspecified: Principal | ICD-10-CM

## 2014-05-20 DIAGNOSIS — M545 Low back pain, unspecified: Secondary | ICD-10-CM

## 2014-05-20 DIAGNOSIS — R7309 Other abnormal glucose: Secondary | ICD-10-CM

## 2014-05-20 LAB — CBC, PLATELET & DIFFERENTIAL
ABSOLUTE BASO COUNT: 0 10*3/uL (ref 0.0–0.1)
ABSOLUTE EOSINOPHIL COUNT: 0.1 10*3/uL (ref 0.0–0.8)
ABSOLUTE IMM GRAN COUNT: 0.02 10*3/uL (ref 0.00–0.03)
ABSOLUTE LYMPH COUNT: 3.5 10*3/uL (ref 0.6–5.9)
ABSOLUTE MONO COUNT: 0.5 10*3/uL (ref 0.2–1.4)
ABSOLUTE NEUTROPHIL COUNT: 4.3 10*3/uL (ref 1.6–8.3)
BASOPHIL %: 0.4 % (ref 0.0–1.2)
EOSINOPHIL %: 0.8 % (ref 0.0–7.0)
HEMATOCRIT: 41.3 % (ref 34.1–44.9)
HEMOGLOBIN: 13.6 g/dL (ref 11.2–15.7)
IMMATURE GRANULOCYTE %: 0.2 % (ref 0.0–0.4)
LYMPHOCYTE %: 41.6 % (ref 15.0–54.0)
MEAN CORP HGB CONC: 32.9 g/dL (ref 31.0–37.0)
MEAN CORPUSCULAR HGB: 30.8 pg (ref 26.0–34.0)
MEAN CORPUSCULAR VOL: 93.7 fL (ref 80.0–100.0)
MEAN PLATELET VOLUME: 11.7 fL (ref 8.7–12.5)
MONOCYTE %: 6.3 % (ref 4.0–13.0)
NEUTROPHIL %: 50.7 % (ref 40.0–75.0)
PLATELET COUNT: 316 10*3/uL (ref 150–400)
RBC DISTRIBUTION WIDTH STD DEV: 45.6 fL (ref 35.1–46.3)
RBC DISTRIBUTION WIDTH: 13.2 % (ref 11.5–14.3)
RED BLOOD CELL COUNT: 4.41 M/uL (ref 3.90–5.20)
WHITE BLOOD CELL COUNT: 8.5 10*3/uL (ref 4.0–11.0)

## 2014-05-20 LAB — THYROID SCREEN TSH REFLEX FT4: THYROID SCREEN TSH REFLEX FT4: 0.482 u[IU]/mL (ref 0.358–3.740)

## 2014-05-20 LAB — HEMOGLOBIN A1C
ESTIMATED AVERAGE GLUCOSE: 108 (ref 74–160)
HEMOGLOBIN A1C: 5.4 % (ref 4.0–5.6)

## 2014-05-20 LAB — VITAMIN D,25 HYDROXY: VITAMIN D,25 HYDROXY: 18 ng/mL — CL (ref 30.0–100.0)

## 2014-05-20 MED ORDER — OMEPRAZOLE 20 MG PO CPDR
20.0000 mg | DELAYED_RELEASE_CAPSULE | Freq: Every day | ORAL | Status: DC
Start: 2014-05-20 — End: 2014-10-15

## 2014-05-20 MED ORDER — CHOLECALCIFEROL 1.25 MG (50000 UT) PO TABS
50000.00 [IU] | ORAL_TABLET | ORAL | Status: AC
Start: 2014-05-20 — End: 2014-08-18

## 2014-05-20 MED ORDER — FLUOXETINE HCL 20 MG PO CAPS
ORAL_CAPSULE | ORAL | Status: DC
Start: 2014-05-20 — End: 2014-10-15

## 2014-05-20 NOTE — Progress Notes (Signed)
Influenza Vaccine Procedure  May 20, 2014    1. Has the patient received the information for the influenza vaccine? Yes    2. Does the patient have any of the following contraindications?  Allergy to eggs? No  Allergic reaction to previous influenza vaccines? No  Any other problems to previous influenza vaccines? No  Paralyzed by Guillain-Barre syndrome?  No  Current moderate or severe illness? No  Allergy to contact lens solution? No    3. The vaccine has been administered in the usual fashion.     Immunization information reviewed. Current VIS reviewed and given to patient/ guardian. Verbal assent obtained from patient/ guardian.  See immunization/Injection module or chart review for date of publication and additional information. Verbal assent obtained from patient/guardian. Comfort measures for possible side effects reviewed.

## 2014-05-20 NOTE — Patient Instructions (Signed)
Index Spanish version Illustration     Low Back Pain Exercises   Exercises that stretch and strengthen the muscles of your abdomen and spine can help prevent back problems. Strong back and abdominal muscles help you keep good posture, with your spine in its correct position.  If your muscles are tight, take a warm shower or bath before doing the exercises. Exercise on a rug or mat. Wear loose clothing. Don’t wear shoes. Stop doing any exercise that causes pain until you have talked with your healthcare provider.  These exercises are intended only as suggestions. Check with your provider before starting the exercises. Ask your provider or physical therapist to help you develop an exercise program. Ask your provider how many times a week you need to do the exercises.  Caution: If you have a herniated disk or other disk problem, check with your healthcare provider before doing these exercises.  Exercises  · Standing hamstring stretch: Put the heel of one leg on a stool about 15 inches high. Keep your leg straight. Lean forward, bending at the hips until you feel a mild stretch in the back of your thigh. Make sure you do not roll your shoulders or bend at the waist when doing this. You want to stretch your leg, not your lower back. Hold the stretch for 15 to 30 seconds. Repeat with each leg 3 times.   · Cat and camel: Get down on your hands and knees. Let your stomach sag, allowing your back to curve downward. Hold this position for 5 seconds. Then arch your back and hold for 5 seconds. Do 2 sets of 15.   · Quadruped arm and leg raise: Get down on your hands and knees. Pull in your belly button and tighten your abdominal muscles to stiffen your spine. While keeping your abdominals tight, raise one arm and the opposite leg away from you. Hold this position for 5 seconds. Lower your arm and leg slowly and change sides. Do this 10 times on each side.   · Pelvic tilt: Lie on your back with your knees bent and your feet  flat on the floor. Pull your belly button in towards your spine and push your lower back into the floor, flattening your back. Hold this position for 15 seconds, then relax. Repeat 5 to 10 times.   · Partial curl: Lie on your back with your knees bent and your feet flat on the floor. Draw in your abdomen and tighten your stomach muscles. With your hands stretched out in front of you, curl your upper body forward until your shoulders clear the floor. Hold this position for 3 seconds. Don't hold your breath. It helps to breathe out as you lift your shoulders. Relax back to the floor. Repeat 10 times. Build to 2 sets of 15. To challenge yourself, clasp your hands behind your head and keep your elbows out to your sides.   · Gluteal stretch: Lie on your back with both knees bent. Rest the ankle of one leg over the knee of your other leg. Grasp the thigh of the bottom leg and pull toward your chest. You will feel a stretch along the buttocks and possibly along the outside of your hip. Hold the stretch for 15 to 30 seconds. Repeat 3 times with each leg.   · Extension exercise:  1. Lie face down on the floor for 5 minutes. If this hurts too much, lie face down with a pillow under your stomach. This should relieve   your leg or back pain. When you can lie on your stomach for 5 minutes without a pillow, you can continue with Part B of this exercise.  2. After lying on your stomach for 5 minutes, prop yourself up on your elbows for another 5 minutes. If you can do this without having more leg or buttock pain, you can start doing part C of this exercise.  3. Lie on your stomach with your hands under your shoulders. Then press down on your hands and extend your elbows while keeping your hips flat on the floor. Hold for 1 second and lower yourself to the floor. Do 3 to 5 sets of 10 repetitions. Rest for 1 minute between sets. You should have no pain in your legs when you do this, but it is normal to feel some pain in your lower  back.  Do this exercise several times a day.  · Side plank: Lie on your side with your legs, hips, and shoulders in a straight line. Prop yourself up onto your forearm so your elbow is directly under your shoulder. Lift your hips off the floor and balance on your forearm and the outside of your foot. Try to hold this position for 15 seconds, then slowly lower your hip to the ground. Switch sides and repeat. Work up to holding for 1 minute or longer. This exercise can be made easier by starting with your knees and hips flexed toward your chest.   Exercises to avoid   It’s best to avoid the following exercises because they strain the lower back:  · legs raised straight and together   · full sit-ups or sit-ups with straight legs   · hip twists.   Sports and other activities   In addition to strengthening your back muscles, it would be helpful to keep your entire body in shape. Physical activities such as walking or swimming are considered to be back-friendly exercises.   It’s always best to check with your healthcare provider before you start an exercise program. Remember to start slowly.   Good activities for people with back problems include:  · walking   · bicycling   · swimming   · cross-country skiing   Some sports can hurt your back because of rough contact, twisting, sudden impact, or direct stress on your back. Sports that may be dangerous to your back include:  · football   · soccer   · volleyball   · handball   · golf   · weight lifting   · trampoline   · tobogganing   · sledding   · snowmobiling   · snowboarding   · ice hockey   Developed by [employer]  Adult Advisor 2012.1 published by [employer].  Last modified: 2010-04-08  Last reviewed: 2009-01-23  This content is reviewed periodically and is subject to change as new health information becomes available. The information is intended to inform and educate and is not a replacement for medical evaluation, advice, diagnosis or treatment by a healthcare  professional.  Adult Advisor 2012.1 Index   © 2012 [employer] and/or its affiliates. All rights reserved.   Index Spanish version

## 2014-05-20 NOTE — Progress Notes (Signed)
SUBJECTIVE:   Stephanie [name] is a 53 year old female who complains of dizziness x 4 days and low back pain for few weeks.   Pain rated 4/10; Feels very tired. Reports when she gets up feels dizzy.  Positional with bending or lifting.  Precipitating factors Thinks pain due to age. Prior history of back problems: recurrent self limited episodes of low back pain in the past.     Pain Radiation - No  Parasthesias - No  Weakness - no  Fever -  no  Urinary incontinence - no  Fecal incontinence -  no    Smoking Status: Former Smoker                   Packs/Day: 0.00  Years:           Types: Cigarettes      Quit date: 07/27/2001    Alcohol Use: Yes           10.0 oz/week       Comment: occasional, weekends      OBJECTIVE:  Well developed, well nourished patient appears to be in mild pain, normal gait noted. Lumbosacral spine area reveals left paraspinal tenderness  Extremities:  SLR - Right negative, Left negative  Neuro;alert and oriented; EOMI, CN 2-12 intact; Gait normal. Reflexes normal and symmetric. Sensation grossly normal  Peripheral pulses are palpable.     A/P  (724.2) Left-sided low back pain without sciatica  (primary encounter diagnosis)  Comment: exam and history c/w msk etiology  Plan: suggested heat and massage; tylenol if pain increases; pt given home exercises; PT if symptoms persist;    (790.29) Elevated hemoglobin A1c  Comment: due for recheck; elevated in 2013   Plan: HEMOGLOBIN A1C      (780.4) Dizziness  Comment: normal neuro exam; description suggests orthostatic hypotension; encouraged pt to push fluids; will check labs given age  Plan: THYROID SCREEN TSH REFLEX FT4, CBC + PLT + AUTO        DIFF, VITAMIN D,25 HYDROXY      (V04.81) Need for prophylactic vaccination and inoculation against influenza  Comment:   Plan: IMMUNIZATION ADMIN SINGLE, RN, PR INFLUENZA         VACCINE QUADRIVALENT 3 YRS PLUS IM (PRIVATE),         CANCELED: IMMUNIZATION ADMIN SINGLE, RN,         CANCELED: INFLUENZA  VIRUS QUAD PRESV FREE         VACCINE 3/> YRS IM (PUBLIC)         (213) Depression  Comment: refilled per pt request  Plan: FLUoxetine (PROZAC) 20 MG capsule          (530.11) Gastroesophageal reflux disease with esophagitis  Comment: refills per epic however pt states pharmacy states no refills available  Plan: omeprazole (PRILOSEC) 20 MG capsule

## 2014-05-20 NOTE — Progress Notes (Signed)
Hi Amberly can you pls call Stephanie [name] and let her know A1c is now normal (great job);    Vitamin D low likely contributing to her fatigue; high dose supplementation sent to pharmacy;   Should take weekly for 12 weeks then return for testing around 07/2014;  No anemia, thyroid normal;  Thanks JL    Component      Latest Ref Rng 05/20/2014   WHITE BLOOD CELL COUNT      4.0 - 11.0 TH/uL 8.5   RED BLOOD CELL COUNT      3.90 - 5.20 M/uL 4.41   HEMOGLOBIN      11.2 - 15.7 g/dL 13.6   HEMATOCRIT      34.1 - 44.9 % 41.3   MEAN CORPUSCULAR VOL      80.0 - 100.0 fL 93.7   MEAN CORPUSCULAR HGB      26.0 - 34.0 pg 30.8   MEAN CORP HGB CONC      31.0 - 37.0 g/dL 32.9   RBC DISTRIBUTION WIDTH STD DEV      35.1 - 46.3 fL 45.6   RBC DISTRIBUTION WIDTH      11.5 - 14.3 % 13.2   PLATELET COUNT      150 - 400 TH/uL 316   MEAN PLATELET VOLUME      8.7 - 12.5 fL 11.7   NEUTROPHIL %      40.0 - 75.0 % 50.7   IMMATURE GRANULOCYTE %      0.0 - 0.4 % 0.2   LYMPHOCYTE %      15.0 - 54.0 % 41.6   MONOCYTE %      4.0 - 13.0 % 6.3   EOSINOPHIL %      0.0 - 7.0 % 0.8   BASOPHIL %      0.0 - 1.2 % 0.4   ABSOLUTE NEUTROPHIL COUNT      1.6 - 8.3 TH/uL 4.3   ABSOLUTE IMM GRAN COUNT      0.00 - 0.03 TH/uL 0.02   ABSOLUTE LYMPH COUNT      0.6 - 5.9 TH/uL 3.5   ABSOLUTE MONO COUNT      0.2 - 1.4 TH/uL 0.5   ABSOLUTE EOSINOPHIL COUNT      0.0 - 0.8 TH/uL 0.1   ABSOLUTE BASO COUNT      0.0 - 0.1 TH/uL 0.0   HEMOGLOBIN A1C      4.0 - 5.6 % 5.4   ESTIMATED AVERAGE GLUCOSE      74 - 160 108   THYROID SCREEN TSH REFLEX FT4      0.358 - 3.740 uIU/mL 0.482   VITAMIN D,25 HYDROXY      30.0 - 100.0 ng/mL 18 (LL)

## 2014-05-23 ENCOUNTER — Ambulatory Visit (HOSPITAL_BASED_OUTPATIENT_CLINIC_OR_DEPARTMENT_OTHER): Payer: PRIVATE HEALTH INSURANCE

## 2014-05-27 NOTE — Progress Notes (Signed)
Called patient to discuss results, unable to reach pt at this time. Left message on voicemail with phone number for clinic included.

## 2014-05-28 NOTE — Progress Notes (Signed)
Dicussed test result and the plan with the patient  The patient verbalizes her understanding   agrees to pick up rx from [place] for vit D 50000 units to be take one tab daily for 12 weeks  Repeat lab after completion of the course of the medication    The patient admits to experiencing fatigue and was told the low vit D may be contributing to the symptoms    The patient complaint of constipation   " I have a lump on my anus, that hurst every time I go to the bathroom"  The patient denies rectal bleeding or fever    Advise prunes juice or stool softer too avoid staining at stool  Drink plenty of water  Eat high fiber diet (fruits, vegetable whole grains)  Sitz bath   Wear cotton underwear         " I don't think it is hemorrhoids, I believe it is something else"  The patient requested an appointment    Appointment made with Dr. Wyonia Hough for 05/29/14 at 2:20 PM

## 2014-05-28 NOTE — Progress Notes (Signed)
Called patient to discuss results, unable to reach pt at this time. Left message on voicemail with phone number for clinic included.

## 2014-05-29 ENCOUNTER — Ambulatory Visit (HOSPITAL_BASED_OUTPATIENT_CLINIC_OR_DEPARTMENT_OTHER): Payer: PRIVATE HEALTH INSURANCE | Admitting: Family Medicine

## 2014-05-30 ENCOUNTER — Ambulatory Visit (HOSPITAL_BASED_OUTPATIENT_CLINIC_OR_DEPARTMENT_OTHER): Payer: PRIVATE HEALTH INSURANCE

## 2014-06-20 ENCOUNTER — Encounter (HOSPITAL_BASED_OUTPATIENT_CLINIC_OR_DEPARTMENT_OTHER): Payer: Self-pay

## 2014-06-20 ENCOUNTER — Ambulatory Visit (HOSPITAL_BASED_OUTPATIENT_CLINIC_OR_DEPARTMENT_OTHER): Payer: PRIVATE HEALTH INSURANCE

## 2014-06-20 DIAGNOSIS — T8542XA Displacement of breast prosthesis and implant, initial encounter: Principal | ICD-10-CM

## 2014-06-20 NOTE — Progress Notes (Signed)
Stephanie [name] is a 53 year old woman who had an augmentation/mastopexy performed in [place] in 2012.  She has had problems with the right breast implant since then.  The implant has been too high.  She had two attempts at surgical correction by the same surgeon in 2013 and 2014.  The right implant is still higher than the left and she does not like the way it looks.  She would either like it revised or both of the implants removed.  They are saline implants.      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  Wise pattern scars bilateral breasts.  Inferior poles of both breasts slightly blunted, right greater than left.  Right implant higher than the left.  Both implants are soft and mobile.     A/P:  Stephanie [name] is a 54 year old woman with a high riding saline implant on the right.  We could attempt to reposition the implant but she has had this attempted 2 times by another surgeon so I'm not sure why that didn't work.  She is considering just having them removed.  She would need to get me the operative note or implant card if she would like the right implant moved.  If she would like them removed, we do not need the previous operative report.  We will give a quote for both of these procedures.

## 2014-06-25 ENCOUNTER — Telehealth (HOSPITAL_BASED_OUTPATIENT_CLINIC_OR_DEPARTMENT_OTHER): Payer: Self-pay

## 2014-06-25 NOTE — Progress Notes (Signed)
Tried to call patient with plastics quote, no answer, will try back.

## 2014-07-01 ENCOUNTER — Other Ambulatory Visit (HOSPITAL_BASED_OUTPATIENT_CLINIC_OR_DEPARTMENT_OTHER): Payer: Self-pay

## 2014-07-01 NOTE — Telephone Encounter (Signed)
Mammogram outreach letter sent to pt

## 2014-07-16 ENCOUNTER — Ambulatory Visit (HOSPITAL_BASED_OUTPATIENT_CLINIC_OR_DEPARTMENT_OTHER): Payer: PRIVATE HEALTH INSURANCE | Admitting: Family Medicine

## 2014-07-18 ENCOUNTER — Ambulatory Visit (HOSPITAL_BASED_OUTPATIENT_CLINIC_OR_DEPARTMENT_OTHER): Payer: PRIVATE HEALTH INSURANCE | Admitting: Family Medicine

## 2014-08-01 ENCOUNTER — Encounter (HOSPITAL_BASED_OUTPATIENT_CLINIC_OR_DEPARTMENT_OTHER): Payer: Self-pay | Admitting: Family Medicine

## 2014-08-01 ENCOUNTER — Ambulatory Visit (HOSPITAL_BASED_OUTPATIENT_CLINIC_OR_DEPARTMENT_OTHER): Payer: PRIVATE HEALTH INSURANCE | Admitting: Family Medicine

## 2014-08-01 VITALS — BP 146/91 | HR 71 | Temp 97.6°F | Wt 147.8 lb

## 2014-08-01 DIAGNOSIS — Z4889 Encounter for other specified surgical aftercare: Principal | ICD-10-CM

## 2014-08-01 DIAGNOSIS — M25511 Pain in right shoulder: Secondary | ICD-10-CM

## 2014-08-01 MED ORDER — HYDROCODONE-ACETAMINOPHEN 5-325 MG PO TABS
1.00 | ORAL_TABLET | Freq: Four times a day (QID) | ORAL | Status: AC | PRN
Start: 2014-08-01 — End: 2014-08-08

## 2014-08-01 NOTE — Progress Notes (Signed)
Pt's surgical site appears clean, steri strips intact on R breast, appear to have been displaced on the midline incision on L breast. Reinforced with 2 new steri strips this evening.   As patient was without discharge instructions or follow up plan with breast surgeon from [place], plan to have her follow up with plastics urgently tomorrow.  Site was padded with ABD pads, thickest dressing available in clinic and surgical bra re-closed with patient.  She tolerated this dressing evaluation and adjustment well, no complaints of pain.  No active bleeding, no purulent drainage or erythema/edema noted surrounding incision sites.

## 2014-08-01 NOTE — Progress Notes (Signed)
53 yo female here for two reasons  1 - had implants removed yesterday in [place]; denies discomfort; took total of 1600 mg ibu and one meloxicam yesterday.  Wasn't able to fill vicodin rx .  She doesn't have instructions from surgeons office.  Sent home and told to remove dressing.  Pt highly anxious and woul dlike to have someone look at surg site    2 - R shoulder pain, on and off for yrs, worse lately  No numbness or tingling  Limited ROM  Hasn't done PT in past though referred    O:  BP 146/91 mmHg   Pulse 71   Temp(Src) 97.6 F (36.4 C) (Temporal)   Wt 67.042 kg (147 lb 12.8 oz)   SpO2 97%   LMP 07/11/2005  AOX3, NAD  HEENT: sclerae white, atraumatic, mmm  Cor: RRR, nl S1S2  Pulm: CTA bilat  Ext: pink, no clubbing, no edema  R shoulder with limited ROM actively, but full passive ROm  Most tenderness over bicipital tendon  Breasts with incision sites mostly well closed except small area with bright red scant blood on L  Some bruising noted at / near surgical site    A/P:    (Z48.89) Encounter for follow-up care involving plastic surgery of breast  (primary encounter diagnosis)  Comment:  abd pads placed (see nursing note)  Plan to get f/u with surg team asap for dressing consult and tx and dress  Plan: REFERRAL TO GENERAL SURGERY ( INT)          (M25.511) Right shoulder pain  Comment: May also benefit from ortho appt   Plan: REFERRAL TO PHYSICAL THERAPY ( INT)          We discussed the patients current medications. The patient expressed understanding and no barriers to adherence were identified.  Marland Kitchen

## 2014-08-02 ENCOUNTER — Other Ambulatory Visit (HOSPITAL_BASED_OUTPATIENT_CLINIC_OR_DEPARTMENT_OTHER): Payer: Self-pay | Admitting: Family Medicine

## 2014-08-02 DIAGNOSIS — Z9889 Other specified postprocedural states: Principal | ICD-10-CM

## 2014-08-07 ENCOUNTER — Telehealth (HOSPITAL_BASED_OUTPATIENT_CLINIC_OR_DEPARTMENT_OTHER): Payer: Self-pay

## 2014-08-07 NOTE — Progress Notes (Signed)
Patient referred for urgent surgery f/up re: dressings  Scheduled for tomorrow, 12/10 at [facility] - she is booked for 4:15pm but is to arrive at 3:30pm  Patient aware and confirms appt info

## 2014-08-08 ENCOUNTER — Ambulatory Visit (HOSPITAL_BASED_OUTPATIENT_CLINIC_OR_DEPARTMENT_OTHER): Payer: Self-pay

## 2014-08-13 ENCOUNTER — Encounter (HOSPITAL_BASED_OUTPATIENT_CLINIC_OR_DEPARTMENT_OTHER): Payer: Self-pay | Admitting: Family Medicine

## 2014-08-13 ENCOUNTER — Ambulatory Visit (HOSPITAL_BASED_OUTPATIENT_CLINIC_OR_DEPARTMENT_OTHER): Payer: PRIVATE HEALTH INSURANCE | Admitting: Family Medicine

## 2014-08-13 VITALS — BP 133/78 | HR 78 | Temp 97.5°F | Wt 148.0 lb

## 2014-08-13 DIAGNOSIS — R42 Dizziness and giddiness: Principal | ICD-10-CM

## 2014-08-13 DIAGNOSIS — M79601 Pain in right arm: Secondary | ICD-10-CM

## 2014-08-13 DIAGNOSIS — Z139 Encounter for screening, unspecified: Secondary | ICD-10-CM

## 2014-08-13 LAB — URINE DIP (POINT OF CARE)
BILIRUBIN, URINE: NEGATIVE
GLUCOSE, URINE: NEGATIVE mg/dl
KETONE, URINE: NEGATIVE mg/dl
LEUKOCYTE ESTERASE: NEGATIVE
NITRITE, URINE: NEGATIVE
OCCULT BLOOD, URINE: NEGATIVE
PH URINE: 5.5 (ref 5.0–8.0)
PROTEIN, URINE: NEGATIVE mg/dl (ref 0–15)
SPECIFIC GRAVITY URINE: 1.02 (ref 1.003–1.030)
UROBILINOGEN URINE: 0.2 mg/dl (ref 0.2–1.0)

## 2014-08-13 MED ORDER — ACETAMINOPHEN 500 MG PO TABS
500.00 mg | ORAL_TABLET | Freq: Four times a day (QID) | ORAL | Status: AC | PRN
Start: 2014-08-13 — End: 2014-09-12

## 2014-08-13 MED ORDER — MECLIZINE HCL 25 MG PO TABS
25.0000 mg | ORAL_TABLET | Freq: Three times a day (TID) | ORAL | Status: AC | PRN
Start: 2014-08-13 — End: 2014-09-12

## 2014-08-14 NOTE — Progress Notes (Signed)
Subjective    Stephanie [name] is a 53 year old female presents with complaint of dizziness x one month;  Pt describes symptoms as room spinning;  Reports symptoms occur daily; pt unclear regarding how many episodes or duration of symptoms;  Notes she gets episodes throughout the day while working;  Denies nausea, vomiting, weakness, paresthesias, numbness, or confusion;  Of note appears pt has has similar symptoms in the past;  Note in 2010 with similar symptoms at the time labrinitis considered;   Pt has had head CT 2013 which was normal and repeated TSH/CBC which have been normal;    Pt also reports right upper arm pain;  Seen for this 2 weeks ago and referred to PT; pt reports has not time for PT;  She tried taking vicodin from previous surgery but reports it made her sleepy;  She is not taking anything for pain now; reports cannot lift up her right arm; denies numbness, paresthesias; 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, building services;      Social History    Marital Status: Divorced            Spouse Name:                       Years of Education:                 Number of children:               Occupational History  Occupation          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; lives                                           w/roommates    Social History Main Topics    Smoking Status: Former Smoker                   Packs/Day: 0.00  Years:           Types: Cigarettes      Quit date: 07/27/2001    Alcohol Use: Yes           10.0 oz/week       Comment: occasional, weekends    Drug Use: No              Sexual Activity: Not Currently     Partners with: Female       Comment: G3P3, no hx STD, no hx abn Pap      Patient Active Problem List:     Anemia, unspecified     Urinary calculus, unspecified     Overweight(278.02)     Pure hypercholesterolemia     FAMILY HX GI MALIGNANCY     Leiomyoma of uterus, unspecified     Vitamin D deficiency     Major depressive disorder      Irritable Bladder     Elevated hemoglobin A1c     Other plastic surgery for unacceptable cosmetic appearance     Rotator cuff tear arthropathy of right  shoulder     AC joint arthropathy     Impingement syndrome of right shoulder     Hematest positive stools     Tubular adenoma of colon     Diverticulosis of colon     Elevated blood pressure (not hypertension)     Hyperopia with astigmatism and presbyopia     Immature cataract     Hypertension, goal below 140/90     Mass of hand        Past Surgical History    OB ANTEPARTUM CARE CESAREAN DLVR & POSTPARTUM      Comment x3    TOTAL ABDOMINAL HYSTERECT W/WO RMVL TUBE OVARY      Comment for fibroids, still has cervix    BREAST ENHANCEMENT SURGERY      Comment 12/12 had bilateral mammoplasty and saline implants, had 2nd surgery 5/30 for revision       Family History    Cancer - Other Father     Comment: stomach    Heart Brother     Comment: died at 69 during valve replacement    Heart Brother     Comment: chestpain with neg w/u    Heart Mother     Comment: Died of MI age 31       Current Outpatient Prescriptions:  acetaminophen (TYLENOL) 500 MG tablet Take 1 tablet by mouth every 6 (six) hours as needed for Pain. Disp: 40 tablet Rfl: 0   meclizine (ANTIVERT) 25 MG TABS Take 1 tablet by mouth every 8 (eight) hours as needed. Disp: 20 tablet Rfl: 0   FLUoxetine (PROZAC) 20 MG capsule TAKE 2 CAPSULES BY MOUTH EVERY DAY Disp: 60 capsule Rfl: 11   omeprazole (PRILOSEC) 20 MG capsule Take 1 capsule by mouth daily. Disp: 30 capsule Rfl: 6   Cholecalciferol 50000 UNITS TABS Take 50,000 Units by mouth once a week. Disp: 4 tablet Rfl: 2   OMEGA 3 1000 MG OR CAPS 1 taily Disp:  Rfl:      No current facility-administered medications for this visit.  Review of Patient's Allergies indicates:  No Known Allergies           Objective    BP 133/78 mmHg   Pulse 78   Temp(Src) 97.5 F (36.4 C) (Temporal)   Wt 67.132 kg (148 lb)   SpO2 98%   LMP 07/11/2005  Gen: NAD, alert and  oriented;  Breasts: steri streps intact, sutures around nipples intact; breast NTTP b/l; there is tenderness over lateral aspect of right pectoralis major, without swelling or erythema; without suspicious masses, skin or nipple changes or axillary nodes, symmetric fibrous changes in both upper outer quadrants.  Shoulder exam: normal exam, no swelling, tenderness, instability; ligaments intact, reduced ROM at shoulder joint 2/2 pain at lateral right pectoralis major  Neurological Exam: normal without focal findings, mental status, speech normal, alert and oriented x iii, PERLA, cranial nerves 2-12 intact, muscle tone and strength normal and symmetric, reflexes normal and symmetric, sensation grossly normal, gait and station normal.         Assessment  Problem List Items Addressed This Visit     None      Visit Diagnoses     Screening    -  Primary     Relevant Orders        Hemoglobin A1c     Dizziness         Relevant Orders        REFERRAL TO NEUROLOGY (  INT)                 Plan      Orders Placed This Encounter  Hemoglobin A1c  REFERRAL TO NEUROLOGY ( INT)  Referral Priority: Routine  Referral Type: Consult and Treat  Referral Reason: Specialty Services Required  Number of Visits Requested: 3  Urine Dip (Point of Care)  acetaminophen (TYLENOL) 500 MG tablet   Sig: Take 1 tablet by mouth every 6 (six) hours as needed for Pain.   Dispense:  40 tablet   Refill:  0  meclizine (ANTIVERT) 25 MG TABS   Sig: Take 1 tablet by mouth every 8 (eight) hours as needed.   Dispense:  20 tablet   Refill:  0    A/P  (R42) Dizziness  (primary encounter diagnosis)  Comment: ? Labynthitis; she has had normal head CT and labs in the past; urine normal today; considered BPPV but no reports of dizziness when changing head positions; given long history of dizziness will refer to neuro for other possible etiologies; for now will trial meclizine to see if this helps with symptoms;  Plan: URINE DIP (POINT OF CARE), meclizine (ANTIVERT)         25 MG TABS, REFERRAL TO NEUROLOGY ( INT)       (Z13.9) Screening  Comment: screening per pt request; states labs elevated at outside lab; explained the difference between serum glucose and A1c;  Plan: HEMOGLOBIN A1C      (M79.601) Pain of right upper extremity  Comment: pain along right pectoris major; no pain in shoulder or arm;  Pt does have reduced ROM at R shoulder due to pain at pec major; explained appears msk for now; encouraged pt to consider PT and she agrees; again printed PT contact info; in no improvement agree refer to ortho  Plan: acetaminophen (TYLENOL) 500 MG tablet        Salvatore Poe PA-C

## 2014-08-22 ENCOUNTER — Encounter (HOSPITAL_BASED_OUTPATIENT_CLINIC_OR_DEPARTMENT_OTHER): Payer: Self-pay | Admitting: Family Medicine

## 2014-10-15 ENCOUNTER — Encounter (HOSPITAL_BASED_OUTPATIENT_CLINIC_OR_DEPARTMENT_OTHER): Payer: Self-pay | Admitting: Family Medicine

## 2014-10-15 ENCOUNTER — Ambulatory Visit (HOSPITAL_BASED_OUTPATIENT_CLINIC_OR_DEPARTMENT_OTHER): Payer: PRIVATE HEALTH INSURANCE | Admitting: Family Medicine

## 2014-10-15 VITALS — BP 132/88 | HR 80 | Temp 98.0°F | Wt 149.0 lb

## 2014-10-15 DIAGNOSIS — K2100 Gastro-esophageal reflux disease with esophagitis, without bleeding: Secondary | ICD-10-CM

## 2014-10-15 DIAGNOSIS — B078 Other viral warts: Secondary | ICD-10-CM

## 2014-10-15 DIAGNOSIS — F3340 Major depressive disorder, recurrent, in remission, unspecified: Secondary | ICD-10-CM

## 2014-10-15 DIAGNOSIS — Z23 Encounter for immunization: Secondary | ICD-10-CM

## 2014-10-15 DIAGNOSIS — G500 Trigeminal neuralgia: Secondary | ICD-10-CM

## 2014-10-15 DIAGNOSIS — I10 Essential (primary) hypertension: Secondary | ICD-10-CM

## 2014-10-15 DIAGNOSIS — N644 Mastodynia: Principal | ICD-10-CM

## 2014-10-15 MED ORDER — ACETAMINOPHEN-CODEINE #3 300-30 MG PO TABS
1.00 | ORAL_TABLET | Freq: Four times a day (QID) | ORAL | Status: AC | PRN
Start: 2014-10-15 — End: 2014-10-29

## 2014-10-15 MED ORDER — OMEPRAZOLE 20 MG PO CPDR
20.0000 mg | DELAYED_RELEASE_CAPSULE | Freq: Every day | ORAL | Status: DC
Start: 2014-10-15 — End: 2015-02-04

## 2014-10-15 NOTE — Progress Notes (Signed)
S: 54 year old female here with concern for L-sided facial numbness and electricity pain for 3 days. A little better today than over the weekend. Entire L side of face. Some "heat", "numbness" and "pulling" feeling. Denies droop, fevers, slurred speech. Never happened before. No gait or vision or hearing changes.    Also with L breast pain, worse with movement. Has persisted for a few weeks, seems to be worsening. Had B/L silicone removal just over 2 months ago. 8/10 at worst, constant, non-radiating, cannot qualify. Taking ibuprofen, oxycodone without help. Denies bleeding or drainage from nipple or surgical site. Has not noticed any new lumps or bumps. No skin changes.    At end of visit notes wart on L hand that she would like removed. Not changing. No treatments tried. No pain.    Stopped fluoxetine and omeprazole for the last week. Unclear as to why. Omeprazole helps reflux symptoms and would like refill. Not interested in taking fluoxetine at this time.      ROS:  Review of systems is negative for constitutional sx, eyes, ENT, CV, resp, GU, endo, heme/lymph and allerg/immun    Patient Active Problem List:     Anemia, unspecified     Urinary calculus, unspecified     Overweight(278.02)     Pure hypercholesterolemia     FAMILY HX GI MALIGNANCY     Leiomyoma of uterus, unspecified     Vitamin D deficiency     Major depressive disorder     Irritable Bladder     Elevated hemoglobin A1c     Other plastic surgery for unacceptable cosmetic appearance     Rotator cuff tear arthropathy of right shoulder     AC joint arthropathy     Impingement syndrome of right shoulder     Hematest positive stools     Tubular adenoma of colon     Diverticulosis of colon     Elevated blood pressure (not hypertension)     Hyperopia with astigmatism and presbyopia     Immature cataract     Hypertension, goal below 140/90     Mass of hand      Current Outpatient Prescriptions on File Prior to Visit:  OMEGA 3 1000 MG OR CAPS  1 taily Disp:  Rfl:      No current facility-administered medications on file prior to visit.  We discussed the patients current medications. The patient expressed understanding and no barriers to adherence were identified.  Review of Patient's Allergies indicates:  No Known Allergies  O:   BP 132/88 mmHg   Pulse 80   Temp(Src) 98 F (36.7 C) (Temporal)   Wt 67.586 kg (149 lb)   SpO2 97%   LMP 07/11/2005  Pain Score: 8/10  Gen: NAD  HEENT: NCAT, no icterus, OP clear, MMM  Neuro: CN 2-12 grossly intact  CV: RRR, nl S1/S2, no MRG  Pulm: CTA B/L, no WRR  Breasts: B/L appropriately healed implant removal scars, L breast with upper outer TTP without lumps, erythema, warmth, nipple discharge, or LAD.  Abd: +BS, soft, mild epigastric TTP, no HSM, no rebound or guarding  Skin: small verrucous lesion to L hand dorsum  Ext: 2+ pulses B/L, no CCE    A/P:  (N64.4) Breast pain, left  (primary encounter diagnosis)  Comment: No suspicious lesions. Likely 2/2 surgical scar tissue.   Plan: US BREAST-AXILLA LEFT, acetaminophen-codeine         (TYLENOL #3) 300-30 MG per tablet  Will get ultrasound to r/o possible underlying infection or bleed. F/u pending results. Strict return and emergent precautions discussed. Pt. iterates understanding and all questions answered.     (G50.0) Trigeminal neuralgia of left side of face  Comment: Reassured  Plan: Declines any medical interventions as is improving. Strict return and emergent precautions discussed. Pt. iterates understanding and all questions answered.     (K21.0) Gastroesophageal reflux disease with esophagitis  Plan: omeprazole (PRILOSEC) 20 MG capsule        Will restart omeprazole. Reviewed use and expected results and timeframe. Strict return and emergent precautions discussed. Pt. iterates understanding and all questions answered.     (B07.8) Other viral warts [B07.8]  Single wart destroyed  Plan: DESTRUCTION BENIGN LESIONS UP TO 14        The treatments, side effects and  failure rates are discussed.  Under sterile technique wart(s) was not shaved.  Liquid Nitrogen Spray was applied to each wart.  The expected skin reaction including erythema, pain, scabbing, blistering and hypopigmented scar formation was discussed.  See at intervals until warts resolved.    POST PAIN ASSESSMENT:  Post pain assessment done. Patient rates pain as a 0 on a 0-10 pain scale.    (I10) Hypertension, goal below 140/90  Comment: At goal  Plan: Continue lifestyle changes. Strict return and emergent precautions discussed. Pt. iterates understanding and all questions answered.     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 Seems stable and without concerning depression today.  Plan: Advised close monitoring and f/u with PCP. Strict return and emergent precautions discussed. Pt. iterates understanding and all questions answered.     (Z23) Need for prophylactic vaccination with combined diphtheria-tetanus-pertussis (DTP) vaccine  Plan: IMMUNIZATION ADMIN SINGLE, RN, TDAP VACCINE 7         AND OLDER IM (PRIVATE)

## 2014-10-15 NOTE — Patient Instructions (Signed)
Nevralgia do Trigmeo  (Trigeminal Neuralgia)  A nevralgia do trigmeo (tic douloure[place])  uma desordem nervosa que causa ataques repentinos de dor facial aguda.  causado pelo dano ao nervo trigmeo, um nervo importante no rosto. O problema  mais comum em mulheres e idosos, embora tambm possa ocorrer em pacientes Relampago. Os ataques duram de alguns segundos a vrios minutos e podem ocorrer de duas vezes ao ano a vrias vezes por dia. A nevralgia do trigmeo pode ser um quadro muito doloroso e incapacitante. A cirurgia pode ser necessria em casos muito severos se o tratamento mdico no proporcionar alvio.  INSTRUES PARA TRATAMENTO DOMICILIAR   Se seu mdico prescreveu medicamento para ajudar a evitar ataques, tome conforme orientado.   Para ajudar a prevenir ataques:   Mastigue do lado no afetado Print production planner.   Evite tocar o rosto.   Evite correntes de ar quente ou frio.   Homens podem querer deixar a barba crescer para evitar ter de se barbear.  PROCURE UM MDICO IMEDIATAMENTE SE:   Se a dor for insuportvel e seu remdio no ajudar.   Se desenvolver sintomas novos e inexplicados.   Se tiver problemas que possam ser relacionados a um medicamento que esteja tomando.  Document Released: 08/16/2005 Document Revised: 11/08/2011  [facility] Patient Information 2014 [place], [place].

## 2014-10-15 NOTE — Progress Notes (Signed)
VIS given prior to administration and reviewed with the patient and or legal guardian. Patient understands the disease and the vaccine. See immunization/Injection module or chart review for date of publication and additional information.

## 2014-10-17 ENCOUNTER — Other Ambulatory Visit (HOSPITAL_BASED_OUTPATIENT_CLINIC_OR_DEPARTMENT_OTHER): Payer: Self-pay | Admitting: Family Medicine

## 2014-10-17 DIAGNOSIS — N644 Mastodynia: Principal | ICD-10-CM

## 2014-10-22 NOTE — Addendum Note (Signed)
Addended by: Florinda Marker on: 10/22/2014 03:09 PM     Modules accepted: Level of Service, SmartSet

## 2014-10-24 ENCOUNTER — Ambulatory Visit (HOSPITAL_BASED_OUTPATIENT_CLINIC_OR_DEPARTMENT_OTHER): Payer: PRIVATE HEALTH INSURANCE | Admitting: Neurology

## 2014-10-25 ENCOUNTER — Ambulatory Visit (HOSPITAL_BASED_OUTPATIENT_CLINIC_OR_DEPARTMENT_OTHER): Payer: Self-pay | Admitting: Family Medicine

## 2014-10-25 DIAGNOSIS — N644 Mastodynia: Principal | ICD-10-CM

## 2014-10-25 LAB — MA SCREENING MAMMO BILATERAL WITH CAD

## 2014-12-05 ENCOUNTER — Ambulatory Visit (HOSPITAL_BASED_OUTPATIENT_CLINIC_OR_DEPARTMENT_OTHER): Payer: PRIVATE HEALTH INSURANCE | Admitting: Neurology

## 2014-12-05 ENCOUNTER — Encounter (HOSPITAL_BASED_OUTPATIENT_CLINIC_OR_DEPARTMENT_OTHER): Payer: Self-pay | Admitting: Neurology

## 2014-12-05 VITALS — BP 118/66 | HR 80 | Wt 151.0 lb

## 2014-12-05 DIAGNOSIS — R5383 Other fatigue: Secondary | ICD-10-CM

## 2014-12-05 DIAGNOSIS — R42 Dizziness and giddiness: Principal | ICD-10-CM

## 2014-12-05 DIAGNOSIS — G479 Sleep disorder, unspecified: Secondary | ICD-10-CM

## 2014-12-05 DIAGNOSIS — R413 Other amnesia: Secondary | ICD-10-CM

## 2014-12-05 NOTE — Progress Notes (Signed)
Neurology Consultation    Stephanie [name] is a 54 year old -Right handed female, sent for consultation by Tawana Scale. Katy Apo, MD for evaluation of dizziness.  We are using phone interpreter services.      She said she is having " a lot of dizziness"--this started about 2010. She notes she can have this when she is crouching, lying in bed or sitting or standing.  For the last 3 months, it has been almost daily, but prior to that, for 5 years, it was happening every 1-2 months. If she bends or squats, it happens almost always, resulting in a sense of head movement and spinning. If she gets up from bed to sit, she feels weak and "spinning" and she has to sit, breathe deeply and wait to stand, with symptoms resolving after 5 minutes. When she is in bed, at times even if still and not moving her head, she feels as if things are moving around her. She denies having nausea, vomiting, headache, diplopia, loss of vision, although she has some sense of "blurriness" at various times, although rare, and she might see objects next to each other at times, "slight double". She denies motor weakness, tingling, numbness, or dysarthria. She does not have tinnitus or hearing loss. Rarely did she describe ear pain.  She does have some mild "tingling" of both hands at night, which requires shaking them or rubbing them.  She also notes " my brain might go blank"--referring to a feeling that she forgets things. She notes for about 5 months she feels that her memory is not as good as it was. She might forget where she puts things, or what specifics people might tell her about details. She might call a friend, but then the next day forgot that she did. She might forget where she parked her car in a big lot, but eventually she finds it. She has left the stove on and has had to call the fire department once. This occurs when she is multitasking. She is able to use technology, phone, computer. She takes care of her bills, and had  had no problems with any ADL's.  On review of the chart, she presented with "dizziness" in 2010, but this resolved fairly quickly.  This was described as happening mainly when she went from lying to standing, associated with a mild headache.  In 2013, she presented to the emergency room of hypertension and a headache, and had a CT scan which did not show any acute abnormality.    She generally eats a healthy diet. She does not hydrate, drinking only coffee in the morning. She sleeps poorly at night, and notes she was told she snores loudly. She is often very fatigued during the day. She previously did daily exercise both for a month or more "Im lazy and tired".  She notes she has excessive daytime fatigue, tires easily even when she does not work. She notes she has "no energy". She feels somewhat "down in the dumps", but mainly due to " being overweight". She has failed at various diets and this is hard for her.      Past medical history:  Patient Active Problem List:     Anemia, unspecified     Urinary calculus, unspecified     Overweight(278.02)     Pure hypercholesterolemia     FAMILY HX GI MALIGNANCY     Leiomyoma of uterus, unspecified     Vitamin D deficiency     Major depressive disorder  Irritable Bladder     Elevated hemoglobin A1c     Other plastic surgery for unacceptable cosmetic appearance     Rotator cuff tear arthropathy of right shoulder     AC joint arthropathy     Impingement syndrome of right shoulder     Hematest positive stools     Tubular adenoma of colon     Diverticulosis of colon     Hyperopia with astigmatism and presbyopia     Immature cataract     Hypertension, goal below 140/90     Mass of hand        Current Outpatient Prescriptions:  omeprazole (PRILOSEC) 20 MG capsule Take 1 capsule by mouth daily. Disp: 30 capsule Rfl: 6   OMEGA 3 1000 MG OR CAPS 1 taily Disp:  Rfl:      No current facility-administered medications for this visit.    Review of Patient's Allergies indicates:  No  Known Allergies      Family History    Cancer - Other Father     Comment: stomach    Heart Brother     Comment: died at 27 during valve replacement    Heart Brother     Comment: chestpain with neg w/u    Heart Mother     Comment: Died of MI age 46     FH: She said her sister had " head problems", psychiatrist, 2 sisters with "migraine". Her mother and sister had a lot of "dizziness".    SH:  She resides in [place], single, 3 grown children, 1 grandchild. She works Education administrator houses.  She is from [place].   reports that she quit smoking about 13 years ago. he does not have any smokeless tobacco history on file. She reports that she drinks about 10.0 oz of alcohol per week. She reports that she does not use illicit drugs.    ROS: A detailed ROS was discussed with the patient, and is negative other than the HPI.    EXAM: BP 118/66 mmHg   Pulse 80   Wt 68.493 kg (151 lb)   SpO2 99%   LMP 07/11/2005   General: moderately built and nourished, in NAD  Eyes: PERRLA, no icterus  HEENT: atraumatic, normocephalic. TM normal, hearing normal.  Neck: no lymphadenopathy, no carotid bruits  CVS: S1, S2 +, regular rate and rhythm,  no murmurs  Pulmonary: lungs clear  Abdomen: soft, non tender, non distended  Extremities: distal pulses palpable, no cyanosis, clubbing, edema  Skin: no rash, warm, well perfused  Neuro:  Mental status: Awake, alert and oriented to person, place and day, date, month and year.   Attentive (able to spell CASA backwards easily), cannot do do serial 7s, but can do 5's.  Speech fluent, without paraphasias. Naming intact. Repetition intact. Comprehension for crossed midline commands intact. Registers 3/3 words. Recalls 2/3 immediately and at 3 minutes. No neglect, apraxia or left-right confusion. Able to name President, "Obama", and "Clinton". Recent events very vague, notes " I dont watch TV". Clock drawing: could write number in, but could not draw "2:30" feeling she did not remember how to draw the  time.  Cranial nerves: Pupils 3 mm, equal, round and reactive to light. Extraocular movements full without nystagmus. Visual fields full to confrontation. Facial sensation normal in V1,V2,V3 distributions. Facial strength full and symmetric at rest and volition. Hearing intact to finger rub bilaterally. Palate elevates symmetrically. Tongue protrudes midline. No dysarthria. Shoulder shrug symmetric.  Motor: Normal bulk and  tone in all extremities. No adventitious movements. No pronator drift. Strength as follows:    Delt  Bi  Tri  WE  FE  IO  APB   IP   Ham   Quad   TA  Gastr    EHL  R 5     5    5     5     5     5    5       5      5         5         5      5           5   L  5     5    5     5     5     5    5       5      5         5         5      5           5   Reflexes:        B   Tr  Brach  Pat  AJ  Toes  R   2----------------------1-> Down  L    2-----------------------1> Down  No clonus  Sensory: Intact to touch, pinprick, cold throughout. Vibratory threshold 15 seconds in the great toes. Proprioception intact in toes. Negative Romberg.  Movements: no tremor, rigidity or other movements seen.   Coordination:  Finger-nose testing and heel-shin without dysmetria or ataxia bilaterally. Able to tandem forwards.  Stance and Gait: Gait is steady with normal base, posture and arm-swing. Rhomberg: negative.  Joints: Full ROM  Dix Hallpike: Marked positive response when turning left head down, with yelling and severe description "dizzy". Would not open her eyes. Symptoms resolved after 20 seconds, but then when sitting, noted " more dizzy".    LABS AND IMAGING REVIEWED:     Exam Date: 12/22/11 Exam Status: Signed    Exam: CT HEAD WITHOUT CONTRAST  Reason for Exam: frontal headache, nausea, photophobia        Indication: Frontal headache, nausea, photophobia    Technique: ET without the use of intravenous contrast.    Comparison: None    Findings: Ventricles, sulci, and cisterns are normal. There is  no  intracerebral hemorrhage, extra-axial collection, acute infarct, or  mass. The globes are intact. The paranasal sinuses are well aerated.  The calvarium is unremarkable.    Impression: No acute intracerebral pathology.    05/20/2014 2:29 PM - User Interface      Component Results     Component Value Flag Ref Range Units Status    THYROID SCREEN TSH REFLEX FT4 0.482         05/20/2014 2:42 PM - User Interface      Component Results     Component Value Flag Ref Range Units Status    HEMOGLOBIN A1C 5.4           ASSESSMENT AND PLAN:     Ms Bishara described a number of symptoms today. With regard to her feelings of being "dizzy", this almost always occur when she moves her head, but can occur in bed. With positive findings on the Dix-Hallpike maneuver, these suggest a left peripheral vestibulopathy, but she also describes some visual symptoms of unclear etiology, although it is  possible she is feeling a sense of movement and notes the accompanying visual change.  I am sending her for ENT evaluation for vestibular rehabilitation.    Given she describes snoring very loudly and excessive daytime drowsiness, I am sending her for a sleep study to rule out Obstructive Sleep Apnea. She is feeling that her daytime is greatly impaired due to fatigued.  I strongly suggested she try to drink water and stay hydrated.  I am sending her for an MRI of the brain given the vertigo, some "double vision" and the "memory" problems she describes. She had some difficulty with some of the questions asked during cognitive office testing and could draw the hands on a clock well. I would like to evaluate these issues further on the next visit; I asked her to please keep track of things that she feels she is having difficulty with so we can address this on her next visit. Many of the difficulties involve multitasking; she is also describing very poor sleep and constant fatigue and it is possible that sleep deprivation/OSA may also be playing a  role in her description of memory difficulty.    Great than 50% of today's visit  Of 60 minutes was spent on direct patient face to face time, discussing the diagnosis, and reviewing treatment plans in detail.    Please feel free to contact me with any questions or concerns.     Verdene Lennert MD

## 2014-12-06 ENCOUNTER — Encounter (HOSPITAL_BASED_OUTPATIENT_CLINIC_OR_DEPARTMENT_OTHER): Payer: Self-pay | Admitting: Otolaryngology

## 2014-12-06 ENCOUNTER — Ambulatory Visit (HOSPITAL_BASED_OUTPATIENT_CLINIC_OR_DEPARTMENT_OTHER): Payer: PRIVATE HEALTH INSURANCE | Admitting: Otolaryngology

## 2014-12-06 DIAGNOSIS — H905 Unspecified sensorineural hearing loss: Secondary | ICD-10-CM

## 2014-12-06 DIAGNOSIS — G450 Vertebro-basilar artery syndrome: Principal | ICD-10-CM

## 2014-12-06 NOTE — Progress Notes (Signed)
History:  Stephanie [name] is a 54 year old female who reports dizziness for several years that worsened in the last 3-4 months. No hearing difficulty or tinnitus reported.    Impressions:  Tympanometry is consistent with normal TM mobility and normal middle ear pressure in both ears. Pure tone testing revealed a mild to moderate SNHL (notch) at 3-6 kHz in the right ear and a mild SNHL at 4 kHz in the left ear. An asymmetry noted at 4-6 kHz, poorer in the right ear. WRS Excellent bilaterally.    Recommendation:  Re-evaluation upon Physician's request.

## 2014-12-06 NOTE — Progress Notes (Signed)
HPI: 54 year old female Stephanie [name] complaints of positional true vertigo with lightheadedness over the past 3-4 mo. Sx comes up with neck extension to all directions, right or left, and esp looking up. It lasts from seconds to a few minutes. Sx has no fatigability and no reversibility. Today there is No otorrhea, No otalgia, No vertigo.    H/o chronic ear infections: No  Past ear surgery: No  Otologic FH: No      Sx Relief Yes, stay away from neck extension.  Sx Exacerbation No      Past Medical History    Depression     Hyperopia with astigmatism and presbyopia 11/21/2012    Immature cataract 11/21/2012       Review of Patient's Allergies indicates:  No Known Allergies      Current Outpatient Prescriptions on File Prior to Visit:  omeprazole (PRILOSEC) 20 MG capsule Take 1 capsule by mouth daily. Disp: 30 capsule Rfl: 6   OMEGA 3 1000 MG OR CAPS 1 taily Disp:  Rfl:      No current facility-administered medications on file prior to visit.    Social and family history reviewed in [place] and are not changed.    Review of Systems  Constitutional normal.  CV/Palpitations No.  Resp/SOB No.  Integ/Skin lesions No.  MSk/Pain No.  Psych normal.    Physical Exam  There were no vitals filed for this visit.    Constitutional: normal.  Facial lesions: No.  Ears: Right: Obstructing cerumen:No. EAC dry and open. TM normal.    Left:  Obstructing cerumen:No. EAC dry and open. TM normal.   Facial Nerve: normal One out of 6 House-Brackmann scale bilaterally.  Nose:  normal.  Oral Cavity: normal.  O/P: Tonsils 0/4 in size bilateral.  Sinus: Pain No.  Neck: Soft. Trachea midline. No palpable masses.  Thyroid: Normal to palpation.  Lymph:  No palpable LN in the neck.  Salivary glands:  Normal size, non-tender.  Communication: Voice normal.    Eyes: PERRLA and EOMI. Nystagmus No  Respiratory drive spontaneous and comfortable.  Cardiac rhythm regular.  Neuro: normal.  Mood/Psych: normal.    I personally reviewed the patient's  audiogram and agree with audiologist's findings and recommendation.    B moderate SNHL symmetric around 4 kHz appropriate for noise induced hearing loss and with aging. Discrim is excellent. Tymp flat B.     A/P  54 year old female with positional vertigo, possibly VBI. Discussed pathophysiology of VBI. Recommend to avoid neck extension. RTC PRN. Pt has MRI brain pending in the near future.

## 2014-12-27 ENCOUNTER — Ambulatory Visit (HOSPITAL_BASED_OUTPATIENT_CLINIC_OR_DEPARTMENT_OTHER): Payer: Self-pay | Admitting: Neurology

## 2014-12-27 DIAGNOSIS — R413 Other amnesia: Secondary | ICD-10-CM

## 2014-12-27 DIAGNOSIS — R42 Dizziness and giddiness: Principal | ICD-10-CM

## 2014-12-30 LAB — MRI BRAIN WO CONTRAST

## 2015-01-01 ENCOUNTER — Ambulatory Visit (HOSPITAL_BASED_OUTPATIENT_CLINIC_OR_DEPARTMENT_OTHER): Payer: Self-pay | Admitting: Neurology

## 2015-01-01 DIAGNOSIS — G479 Sleep disorder, unspecified: Principal | ICD-10-CM

## 2015-01-01 NOTE — Addendum Note (Signed)
Addended by: Ancil Boozer on: 01/01/2015 08:22 PM     Modules accepted: Orders

## 2015-01-15 ENCOUNTER — Other Ambulatory Visit (HOSPITAL_BASED_OUTPATIENT_CLINIC_OR_DEPARTMENT_OTHER): Payer: Self-pay | Admitting: Family Medicine

## 2015-01-15 DIAGNOSIS — D126 Benign neoplasm of colon, unspecified: Principal | ICD-10-CM

## 2015-01-22 ENCOUNTER — Encounter (HOSPITAL_BASED_OUTPATIENT_CLINIC_OR_DEPARTMENT_OTHER): Payer: Self-pay | Admitting: Pulmonary

## 2015-01-22 LAB — SLEEP STUDY

## 2015-01-22 NOTE — Progress Notes (Signed)
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     This is mild disease only with an AHI of 9 ( normal is < 5)     Thanks    AW

## 2015-01-23 ENCOUNTER — Encounter (HOSPITAL_BASED_OUTPATIENT_CLINIC_OR_DEPARTMENT_OTHER): Payer: Self-pay

## 2015-01-23 ENCOUNTER — Other Ambulatory Visit (HOSPITAL_BASED_OUTPATIENT_CLINIC_OR_DEPARTMENT_OTHER): Payer: Self-pay

## 2015-01-23 DIAGNOSIS — Z1211 Encounter for screening for malignant neoplasm of colon: Principal | ICD-10-CM

## 2015-01-23 MED ORDER — PEG 3350-KCL-NA BICARB-NACL 420 G PO SOLR
ORAL | 0 refills | Status: DC
Start: 2015-01-23 — End: 2015-02-10

## 2015-01-23 NOTE — Patient Instructions (Addendum)
Verbal instructions given over phone. Written instructions (in portuguese) sent to patient in mail. Script sent to pharmacy.    Colonoscopy Standard       Division of Gastroenterology  [facility]    Address: [address] [place], [place] [address]    PLEASE READ CAREFULLY OR HAVE SOMEONE READ THIS TO YOU!        Your Colonoscopy test is scheduled at [facility]    Day__6/14/16_____________     Time to Arrive ___12:10 pm____________    Register ______________________            Dennis Bast are having a colonoscopy.  This test lets the doctor see the inside your large intestine (also called your colon) using a small camera on a flexible tube.  There are several reasons to have the test:   *Screening for colon cancers and polyps    *To check unexplained changes in bowel habits   *To evaluate abnormal X-ray findings   *To look for bleeding ulcers or other abnormalities of the colon lining                 HOW TO GET READY FOR THE TEST      Your prescription was sent to your pharmacy or given to you.    Please pick up your prescription within 1 week of receiving these instructions.     IF YOU ARE USING NULYTELY OR A DRUG LIKE IT, DO NOT ADD WATER TO THE PRESCRIPTION UNTIL 1 DAY BEFORE THE DAY OF YOUR EXAM.    Your prescription was sent to: ____[facility] outpatient pharmacy__________________________________    Call a friend or a family member to make sure you have   someone to take you home after your test.   You must have someone to go home with after the test or we will not be able to give you sedatives!      If you have any questions or worries, or if you are unsure about how to prepare for this test, please call _[phone]____________                                  DAY OF THE TEST    4 to 5 hours before your test, drink the remaining half of the bottle of laxative.    It is very important to finish the WHOLE [place].    The morning of the test, you can take your other medications at the usual time  with a sip of water. These include blood pressure pills, seizure medications, heart medications, thyroid medications, etc).     Dont take pills for diabetes. Bring these medicines with you so that you can take them right after your test.      NOTHING to drink for 3 hours before the test.      IMPORTANT INFORMATION About Your Medicines  Based On Recommendations From Experts    IF YOU HAVE ANY CONCERNS ABOUT YOUR MEDICATIONS [place]. DO NOT WAIT UNTIL THE DAY BEFORE THE TEST!!!!!      Your Medications  You can take your medications for high blood pressure, heart problems, or anxiety with a sip of water at your usual time.  Bring a list of your medications with you.     If you take medicines for Type 2 Diabetes:    One day before the test: If you are taking diabetes pills: Take PILLS in  PILLS in the morning only. If you take morning insulin: take your usual dose.    On the night before the test: Do not take diabetes pills.  If you take insulin for type 2 diabetes, take ½ your usual long acting insulin.  For example: if you usually take 40 units of Lantus or NPH, take 20 units instead.    On the morning of the test: Do not take diabetes pills. If you are on long-acting insulin for type 2 diabetes, you can take half the dose. For example if you are on 40 units of Lantus or NPH, take 20 units instead.     After the test: You will be allowed to eat normally again. At that time, you should resume taking your diabetes pills at your usual times. If on insulin, take your usual evening dose of insulin. If you can’t eat for any reason, check with your doctor.    If you have Type 1 Diabetes:    One day before the test: Take your usual long-acting basal insulin (Lantus or NPH) and make sure your clear liquid diets contain sugar. Check your blood sugars at meal times and cover with short acting insulin only if blood sugar is over 200. Otherwise do not take your short-acting insulin.    On the night before the test: Just  take your usual basal insulin dose that you would normally take at that time (for example, your usual full dose of Lantus or NPH).    On the morning of the test: Take your usual basal insulin dose that you would normally take at that time (for example, your usual full dose of Lantus or NPH).                            Test Checklist    Please use this list to prepare for your test.     7 days before the test (____/____/____)     STOP taking Motrin, Advil, Naprosyn, Aleve or related drugs.   Continue to take all other prescribed meds.   Ask your doctor about whether you should continue Aspirin. It is sometimes        important to stay on this medication.    3 days before the test (____/____/____)     STOP eating any foods that contain beans, seeds, skins, nuts, or are high in fiber.    2 days before the test (____/____/____)     MAKE SURE YOU HAVE A RIDE ARRANGED    EAT dinner no later than 7 pm.  BEGIN a liquid diet. (Do not eat solids. This includes foods such as rice, pasta, bread and fruit).    1 day before the test (____/____/____)     PREPARE the laxative.   START taking the laxative at 6:00pm.   DRINK ½ the bottle of the laxative.    Day of your test (____/____/____)     FINISH  the rest of the laxative 5 to 6 hours before your test.     DO NOT drink anything 3 hours before the test.      WHAT TO EXPECT ON THE DAY OF YOUR TEST    Colonoscopy  Colonoscopy is a procedure used to see inside the colon and rectum.  During a colonoscopy a flexible tube with a camera and a light is inserted through the rectum. The doctor then examines the large bowel (also called the large intestine or colon).      Colonoscopies can detect inflamed tissue, ulcers and abnormal growths called polyps. Some polyps are cancerous, but most are “pre cancerous”. These might become dangerous someday. A doctor can usually remove these polyps during the test. They are then sent to a laboratory to be checked. Polyps are common in adults and are  generally harmless. However, most colorectal cancers begin as polyps. Removing them is a good way to prevent cancer. Your doctor may also take biopsies (small pieces of tissue) for analysis in the lab of abnormalities that they want to check in more detail.    It is extremely important to follow all of the steps for cleaning out your colon. If you do not follow all the steps, the doctor may not be able to see clearly. The exam might need to be cancelled and repeated another day. The clear liquids you can take during the clean out are treated as food by your body, so you won’t starve or get dehydrated.        Getting Ready At The Hospital  On the day of your test, a nurse and doctor will ask you some more information about your health history.  You will be put into a hospital gown. A small intravenous needle will be inserted in the back of your hand or forearm to give you medications that will make you comfortable during the test.    In the procedure room, you will be asked to lie down in a curled position on your left side.  Please inform the doctor if this is uncomfortable for you. You will be given oxygen to breathe. The test usually lasts 30 minutes to an hour. It may last longer if polyps need to be removed or if other abnormalities are noted.      You will be given medication through the IV in order to control discomfort and help you relax.  You may sleep or be partially awake during the test. Sometimes, you might feel a cramping sensation. We will monitor you and try to make you as comfortable as possible. The tube will be inserted into your rectum (back side) and advanced through the large bowel.  The doctor will try and look at all of the inner walls of your colon. The outer wall and organs outside the colon are not visible with this test.    Possible Complications  Complications are unusual during or after the test but they can happen. The most common risks include colonic perforation (a tear in the colon),  bleeding, respiratory problems, blood pressure problems, heart problems, discomfort and adverse reactions to the medications used.  A perforation may result in the need for emergency surgery and a colostomy bag. Also note, colonoscopy like other medical tests is not perfect. It may not detect problems such as polyps, cancers and other diseases up to 2 to 6% of the time. Luckily, the combined risk of all of these problems is small.    After the Test  You may feel bloated from air which was put into your colon during the test. You may also feel a little drowsy from the medications. You cannot drive or operate heavy machinery or do any important work for the rest of the day. You should plan on resting, watching TV or reading light material after the test. You may forget things that happen during and directly after the test. It is important have someone with you that can remind you of any instructions we give.    Depending on what   is found, you may need to have the colonoscopy repeated. Usually, this is done several years later but it may need to be done much sooner. Talk to your doctor about when you should have repeat study or other testing.    You will usually be at the hospital between 2-3 hours (although sometimes it can take longer).  We will make sure that you are alright before sending you home. You must arrange for someone to drive you home after the test.  Once again, we will not perform the test unless you have an arranged ride. You cannot go home in a taxi or a bus.    Colonoscopy is a safe and effective test that is commonly done at our facilities. You may receive a call to remind you of the date and time of your test. If you have any questions, please feel free to call.     For questions about the test itself, call [phone].    For questions about the date and time of your test, or to change the date or time, call [phone]    For questions regarding your regular medications or health issues, please  call your doctor.    Stephanie [name]  Standard colonoscopy-Portuguese       [place]  Telefone:__________________  Endereo: [address] [place], [place] [address]    POR FAVOR LEIA ATENCIOSAMENTE OU PEA A ALGUM PARA LER ISTO PARA VOC!    ___________________________________________  [place]          Voc far uma [name].  Este exame permite o mdico a examinar o interior do intestino grosso tambm chamado de clon, usando uma pequena cmera em um tubo flexvel.  H vrias razes para se fazer o exame:   Rastreamento de cnceres e plipos no clon    Verificar se h alguma alterao inexplicvel nos hbitos intestinais   Avaliar anormalidades indicadas em radiografias   Examinar lceras hemorrgicas ou outras anormalidades na mucosa do clon                COMO SE PREPARAR PARA O EXAME    Sua receita foi enviada  farmcia ou entregue a voc.    Por favor avie sua receita dentro de 1 semana aps receber essas instrues.     SE VOC ESTIVER USANDO NULYTELY OU UM REMDIO DESSE TIPO, NO ADICIONE GUA AO REMDIO AT 1 DIA ANTES DO EXAME.    Sua receita foi enviada para:______________________________________      Fale com um amigo ou um familiar para garantir que voc   tenha um acompanhante para lev-lo de [employer].   Voc deve ter um acompanhante para lev-lo de volta  casa aps o exame ou no poderemos dar-lhe sedativos!       Se tiver alguma dvida ou preocupaes, ou se voc no tem certeza de como se preparar para o exame, por favor telefone para _____________________            Algumas condies mdicas exigem que voc permana tomando a Aspirina.  Verifique com seu mdico.  Importante: No pare a aspirina at [employer] voc fale com o mdico.                              NO DIA DO EXAME    De 4 a 5 horas antes do exame, tome a metade restante da garrafa do laxativo.   muito  importante terminar TODO O GALO.    Na manh do exame,  voc pode tomar seus outros medicamentos no horrio habitual com um gole de gua.  Estes incluem medicao de presso arterial, anticonvulsivantes, medicao cardaca, medicao da tiride e etc)   .     No tome a medicao da diabetes.  Traga esses medicamentos com voc para que possa tom-los logo aps o exame.      [place] para beber por 3 hora antes do exame.      INFORMAES IMPORTANTES sobre sua medicao  Baseado Em Recomendaes De Peritos    SE VOC TIVER [place] DVIDA SOBRE SUA MEDICAO, POR FAVOR CONVERSE COM SEU MDICO. NO ESPERE AT O DIA ANTES DO EXAME!!!!!    Sua Medicao:  Voc pode tomar a medicao para presso alta, problemas cardacos ou de ansiedade com um gole de gua em seu horrio normal.  Traga sua lista de medicamentos com voc.     Se voc tem Diabetes Tipo 2:     Um dia antes do exame. Se voc toma plulas de diabetes. Tome somente as Photographer. Se voc toma insulina pela manh: tome sua dose habitual     Na noite anterior ao exame: No tome as plulas da diabetes.  Se voc toma insulina para diabetes tipo 2, tome metade de [place] dose habitual de insulina de [employer]. Por exemplo: se voc costuma tomar 40 unidades de Lantus ou insulina NPH,  tome 20 unidades em vez disso.    Na manh do exame: No tome as plulas da diabetes. Se voc toma insulina de ao prolongada para diabetes tipo 2, voc pode tomar a Insurance claims handler dose. Por exemplo: se toma 40 unidades de Lantus ou insulina NPH, tome 20 unidades em vez disso.     Aps o exame: Voc poder voltar a se alimentar normalmente. Carmelina Noun altura, voc deve retornar ao horrio habitual da [employer] diabetes . Tome sua dose habitual de insulina  noite. Se voc no conseguir comer por qualquer motivo, verifique com o mdico.    Se voc tem Diabetes Tipo 1:    Um dia antes do exame. Tome sua dose habitual de insulina basal de ao prolongada  (Lantus ou NPH) e certifique-se de que os lquidos transparentes de sua  dieta contenham  acar.Oren Section sua  glicose sangunea na hora das refeies e somente se a Retail banker acima de 200, use a insulina de ao rpida [place]. Rush Barer forma, no tome a insulina de ao rpida    Na noite anterior ao exame: Tome sua dose habitual de insulina basal que voc normalmente toma neste horrio (por exemplo, sua dose completa habitual  de Lantus ou NPH).  Na manh do exame: Tome sua dose habitual de insulina basal que voc normalmente toma neste horrio (por exemplo, sua dose completa habitual  de Lantus ou NPH).    Observe tambm ...    O Tylenol (Acetaminofeno) pode ser tomado para alvio de dor moderada.     Se voc toma anticoagulantes sanguneos (tais como Aspirina, [place], Heparina, Coumadin ou Plavix) voc deve perguntar ao mdico ou enfermeiro antes de interromper estes medicamentos!!! Isso  importante se voc toma esses medicamentos para doenas cardacas, acidente vascular cerebral, embolia pulmonar, vlvulas cardacas artificiais, stent cardaco ou vascular ou qualquer outro problema que poderiam levar a uma grave formao de cogulo, acidente vascular cerebral ou ataque cardaco.          Lista de Verificao do Exame  Por favor, use esta lista para preparar-se para o exame.     7 dias antes do exame (____/____/____)     PARE de tomar Motrin, Advil, Naprosyn, Aleve ou medicamentos deste tipo.    Continue a tomar todos os outros medicamentos receitados.     Pergunte ao mdico se voc deve continuar a Aspirina.  s vezes  importante seguir tomando esta medicao.    3 dias antes do exame (____/____/____)    Tyler Aas de comer todos os alimentos que contm sementes, cascas de frutas, castanhas ou ricos em fibra    2 dias antes do exame (____/____/____)     Lavone Orn QUE [place] Cuff UM ACOMPANHANTE      JANTE antes das 19:00hs.  INICIE uma dieta lquida.  (No coma nada slido. Isso inclui alimentos como arroz, [place], po e frutas).    1 dias antes do exame  (____/____/____)     PREPARE o laxativo.   COMECE a tomar o laxativo s 18:00.    [place] do laxativo.     No dia do exame (____/____/____)      Hinda Lenis o restante do laxativo 5 a 6 horas antes do exame.    NO beba nada por 3 horas antes do exame.      O QUE ESPERAR NO DIA DO EXAME    [name]    A [name]  um procedimento usado para examinar a parte interna do clon e do reto.  Durante uma [name] um tubo flexvel com uma cmera e uma luz  inserido atravs do reto. O mdico em seguida examina o intestino grosso (tambm chamado de clon).    A [name] pode detectar tecidos inflamados, lceras e tumores anormais chamados plipos. Alguns plipos so malignos, mas a maioria so "pr-cancerosos". Estes podem tornar-se perigosos um dia. O mdico geralmente pode remover esses plipos durante o exame. Eles ento so enviados ao laboratrio para verificao. Os plipos so comuns em adultos e geralmente so inofensivos. No entanto, a maioria dos cnceres colorretais se iniciam como plipos. Remov-los  uma boa forma de prevenir o cncer. O mdico talvez retire algumas bipsias (pequenos fragmentos de tecido) para Journalist, newspaper de anormalidades que ele [place] verificar mais detalhadamente.     extremamente importante seguir todos os passos da limpeza do clon .  Se voc no seguir todos os passos, o mdico pode no conseguir examinar com clareza. O exame poder ser anulado e repetido outro dia. Os lquidos transparentes que voc pode tomar durante a limpeza so encarados como alimento por ser organismo, por isso voc no passar fome nem ficar desidratado.        Preparao No Hospital    No dia do exame, um enfermeiro ir pedir-lhe algumas informaes sobre seu histrico mdico.  Voc vestir um roupo do hospital. Antes do mdico iniciar o procedimento, uma pequena agulha intravenosa ser inserida na parte de trs de sua mo ou em seu antebrao para Architectural technologist os medicamentos que o deixaro  confortvel durante o exame.    Na sala do procedimento, voc ser solicitado a deitar-se do lado esquerdo, em uma posio fetal.  Por favor, informe ao mdico se esta posio for desconfortvel para voc. Voc receber oxignio para respirar. O procedimento leva de 30 minutos 1 hora. Pode ser [employer], se for necessrio a remoo de plipos ou se forem notadas outras anormalidades.      Voc receber Karle Plumber da IV para controlar o desconforto e ajud-lo a Counselling psychologist.  Voc talvez durma  ou fique parcialmente acordado durante o exame. s vezes, voc talvez sinta uma sensao de clica. Estaremos monitorando e tratando de deix-lo o mais confortvel possvel. O tubo ser inserido no reto (parte de trs) e Raford Pitcher do intestino grosso.  O mdico tentar examinar todas as paredes internas de seu clon. A parede externa e os rgos fora do clon no so visveis neste exame.    Possveis Complicaes    As complicaes so raras durante ou aps o exame, porm podem ocorrer. Os riscos mais comuns incluem perfurao intestinal (um rompimento no clon) sangramento, problemas respiratrios, problemas de presso arterial, problemas cardacos, desconforto e reaes adversas  medicao utilizada.  Uma perfurao pode resultar na necessidade de uma cirurgia de emergncia e uma bolsa de colostomia. Esteja ciente tambm que a [name], como outros exames mdicos, no  perfeita. Ela pode falhar em ver problemas como plipos ou outras doenas de 2% a 6% das vezes. Felizmente, os riscos  de todos esses problemas combinados  pequeno.        Aps o exame:    Voc talvez sinta-se inchado devido ao ar que foi colocado no clon durante o exame. Voc talvez sinta-se um pouco sonolento devido  medicao. Pelo resto do dia, voc no poder operar maquinrio pesado (como um carro) nem fazer nenhum trabalho importante. Voc deve planejar repousar, assistir televiso ou fazer uma leitura fcil aps o exame. Voc talvez  esquea de tudo o que aconteceu durante o exame e imediatamente aps o exame .  importante ter algum com voc que possa lembr-lo de todas as instrues que dermos.    Dependendo do que for encontrado, voc talvez precise repetir a [name]. Geralmente, ela s  repetida vrios anos mais tarde, mas talvez seja necessrio repet-la mais cedo. Fale com seu mdico sobre quando voc dever repetir o exame ou outros testes.    De uma forma geral, voc ficar no hospital de 2  3 horas (apesar de algumas vezes levar mais tempo).  Garantiremos que voc esteja se sentindo muito bem antes da alta. Voc deve ter um acompanhante que o leve de volta  casa aps o exame.  Mais uma vez, no realizaremos o exame se voc no tiver um acompanhante para lev-lo de Water engineer. Voc no poder ir para casa de txi nem de nibus.    A [name]  um exame seguro e eficaz que  normalmente realizado em nossas instalaes. Voc talvez receba um telefonema de lembrete com a data e a hora de seu exame. Se tiver alguma pergunta, por favor sinta-se  vontade para telefonar:     Para perguntas sobre o prprio exame, telefone no [id number].    Para perguntas sobre a data e a hora de seu exame, ou para alterar a data ou a hora, telefone no [id number].    Para perguntas sobre sua medicao de rotina ou problemas mdicos, por favor, telefone para seu mdico.

## 2015-01-23 NOTE — Telephone Encounter (Signed)
Spoke with patient regarding scheduling a colonoscopy with Dr Marcelene Butte for a history of tubular adenomas in 2013.  Procedure/preparation/need for ride explained. Medical history reviewed with patient. Patient scheduled for a colonoscopy  With Dr Marcelene Butte 02/11/15 to arrive at 12:10 pm. Scheduled with anesthesia assist due to a recent + sleep study.

## 2015-02-04 ENCOUNTER — Ambulatory Visit (HOSPITAL_BASED_OUTPATIENT_CLINIC_OR_DEPARTMENT_OTHER): Payer: PRIVATE HEALTH INSURANCE | Admitting: Family Medicine

## 2015-02-04 VITALS — BP 135/79 | HR 95 | Temp 97.8°F | Ht 59.75 in | Wt 154.8 lb

## 2015-02-04 DIAGNOSIS — R142 Eructation: Principal | ICD-10-CM

## 2015-02-04 DIAGNOSIS — F5104 Psychophysiologic insomnia: Secondary | ICD-10-CM

## 2015-02-04 DIAGNOSIS — R141 Gas pain: Principal | ICD-10-CM

## 2015-02-04 DIAGNOSIS — R143 Flatulence: Principal | ICD-10-CM

## 2015-02-04 DIAGNOSIS — G4730 Sleep apnea, unspecified: Secondary | ICD-10-CM

## 2015-02-04 DIAGNOSIS — K210 Gastro-esophageal reflux disease with esophagitis: Secondary | ICD-10-CM

## 2015-02-04 LAB — COMPREHENSIVE METABOLIC PANEL
ALANINE AMINOTRANSFERASE: 37 U/L (ref 12–45)
ALBUMIN: 3.9 g/dL (ref 3.4–5.0)
ALKALINE PHOSPHATASE: 89 U/L (ref 45–117)
ANION GAP: 8 mmol/L (ref 5–15)
ASPARTATE AMINOTRANSFERASE: 21 U/L (ref 8–34)
BILIRUBIN TOTAL: 0.4 mg/dL (ref 0.2–1.0)
BUN (UREA NITROGEN): 13 mg/dL (ref 7–18)
CALCIUM: 9.6 mg/dL (ref 8.5–10.1)
CARBON DIOXIDE: 26 mmol/L (ref 21–32)
CHLORIDE: 105 mmol/L (ref 98–107)
CREATININE: 0.7 mg/dL (ref 0.4–1.2)
ESTIMATED GLOMERULAR FILT RATE: >60 mL/min (ref >60)
Glucose Random: 110 mg/dL (ref 74–160)
POTASSIUM: 3.6 mmol/L (ref 3.5–5.1)
SODIUM: 139 mmol/L (ref 136–145)
TOTAL PROTEIN: 8.1 g/dL (ref 6.4–8.2)

## 2015-02-04 LAB — LIPASE: LIPASE: 170 U/L (ref 73–393)

## 2015-02-04 MED ORDER — MIRTAZAPINE 15 MG PO TABS
15.0000 mg | ORAL_TABLET | Freq: Every evening | ORAL | 0 refills | Status: DC
Start: 2015-02-04 — End: 2017-02-25

## 2015-02-04 MED ORDER — SIMETHICONE 80 MG PO CHEW
80.00 mg | CHEWABLE_TABLET | Freq: Four times a day (QID) | ORAL | 0 refills | Status: AC | PRN
Start: 2015-02-04 — End: 2015-03-06

## 2015-02-04 MED ORDER — OMEPRAZOLE 20 MG PO CPDR
20.0000 mg | DELAYED_RELEASE_CAPSULE | Freq: Every day | ORAL | 6 refills | Status: DC
Start: 2015-02-04 — End: 2016-07-19

## 2015-02-04 NOTE — Progress Notes (Signed)
Subjective     Stephanie [name] is a 54 year old female presents with the following concerns;    1) reports feeling bloating;  Has been happening for 3-4 months on and off;  Taking omeprazole which helps with the discomfort but not the bloating;  Reports she is not eating acidic things;  Reports eating a lot of fried foods and sugar cane juice;  She denies unintentional weight loss;  Denies vomiting;    History of tobacco use 17 years ago;  Smoked for 10 year 1 cigarette a day;    2) reports stressed; has difficultly sleeping; taking prozac which she reported in the past to have discontinued but notes no improvement in stress or sleep;    3) would like refill on omeprazole; has refills but states when she presents to pharmacy told she doesn't have any;    4) would like results of sleep apnea study;    Per Dr. Dema Severin:  Has mild osa on sleep study and we will call her and see if she wants to pursue cpap titration .     This is mild disease only with an AHI of 9 ( normal is < 5)     Thanks    AW    Social History    Marital status: Divorced            Spouse name:                       Years of education:                 Number of children:               Occupational History  Occupation          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; lives                                           w/roommates    Social History Main Topics    Smoking status: Former Smoker                                                                Packs/day: 0.00      Years: 0.00           Types: Cigarettes       Quit date: 07/27/2001    Alcohol use: Yes           10.0 oz/week       Comment: occasional, weekends    Drug use: No              Sexual activity: Not Currently     Partners with: Female       Comment: G3P3, no hx STD, no hx abn Pap      Patient Active  Problem List:     Anemia, unspecified     Urinary calculus, unspecified     Overweight(278.02)     Pure hypercholesterolemia     Family  history of malignant neoplasm of gastrointestinal tract     Leiomyoma of uterus, unspecified     Vitamin D deficiency     Major depressive disorder     Irritable bladder     Elevated hemoglobin A1c     Other plastic surgery for unacceptable cosmetic appearance     Rotator cuff tear arthropathy of right shoulder     AC joint arthropathy     Impingement syndrome of right shoulder     Hematest positive stools     Tubular adenoma of colon     Diverticulosis of colon     Hyperopia with astigmatism and presbyopia     Immature cataract     Hypertension, goal below 140/90     Mass of hand      Past Surgical History    OB ANTEPARTUM CARE CESAREAN DLVR & POSTPARTUM      Comment x3    TOTAL ABDOMINAL HYSTERECT W/WO RMVL TUBE OVARY      Comment for fibroids, still has cervix    BREAST ENHANCEMENT SURGERY      Comment 12/12 had bilateral mammoplasty and saline implants, had 2nd surgery 5/30 for revision       Family History    Cancer - Other Father     Comment: stomach    Heart Brother     Comment: died at 40 during valve replacement    Heart Brother     Comment: chestpain with neg w/u    Heart Mother     Comment: Died of MI age 7       Current Outpatient Prescriptions:  polyethylene glycol-electrolytes (NULYTELY WITH FLAVOR PACKS) 420 G solution Take as directed prior to colonoscopy Disp: 1 Bottle Rfl: 0   omeprazole (PRILOSEC) 20 MG capsule Take 1 capsule by mouth daily. Disp: 30 capsule Rfl: 6   OMEGA 3 1000 MG OR CAPS 1 taily Disp:  Rfl:      No current facility-administered medications for this visit.   Review of Patient's Allergies indicates:  No Known Allergies           Objective     BP 135/79  Pulse 95  Temp 97.8 F (36.6 C) (Temporal)  Ht 4' 11.75" (1.518 m)  Wt 70.2 kg (154 lb 12.8 oz)  LMP 07/11/2005  SpO2 98%  BMI 30.49 kg/m2  O: NAD  HEENT: atraumatic, no icterus, OP clear without erythema or exudates; TM's without erythema or effusion; neck supple, no LAN  Skin: no lesions or rash  Lungs: CTA b/l  Cardiac: nl  S1/S2, no murmurs, rubs, gallops  Abdomin: soft, + epigastric tenderness; ND;BS normoactive; no guarding or rebound; without hepatosplenomegaly  Ext: no edema or clubbing           Assessment   Problem List Items Addressed This Visit     None                Plan    (R14.3,  R14.1,  R14.2) Flatulence, eructation, and gas pain  (primary encounter diagnosis)  Comment: we discussed relation between high sugar foods and gas; discussed cutting back on carbs and sugary drinks; will check labs given epigastric tenderness; will attempt to add on H pylori  Plan: COMPREHENSIVE METABOLIC PANEL, LIPASE  simethicone (MYLICON) 80 MG chewable tablet    (K21.0) Gastroesophageal reflux disease with esophagitis  Comment:   Plan: omeprazole (PRILOSEC) 20 MG capsule,     (F51.04) Psychophysiological insomnia  Comment: reports stressed not sleeping;  Does not want to take prozac anymore;  Plan: mirtazapine (REMERON) 15 MG tablet    (G47.30) Sleep apnea, unspecified sleep apnea type  Comment: pt had questions regarding results of sleep study; appears patient to be called from office to discuss cpap titration; spoke with Otila Kluver from pulm she states ordered needed;  She will send request to Dr. Dema Severin; pt preferences for testing completed;    No orders of the defined types were placed in this encounter.    F/u in one month for GI sxs and remeron f/u;    Paighton Godette PA-C

## 2015-02-06 ENCOUNTER — Encounter (HOSPITAL_BASED_OUTPATIENT_CLINIC_OR_DEPARTMENT_OTHER): Payer: Self-pay | Admitting: Pulmonary

## 2015-02-10 ENCOUNTER — Other Ambulatory Visit (HOSPITAL_BASED_OUTPATIENT_CLINIC_OR_DEPARTMENT_OTHER): Payer: Self-pay

## 2015-02-10 DIAGNOSIS — Z1211 Encounter for screening for malignant neoplasm of colon: Principal | ICD-10-CM

## 2015-02-10 MED ORDER — PEG 3350-KCL-NA BICARB-NACL 420 G PO SOLR
ORAL | 0 refills | Status: AC
Start: 2015-02-10 — End: 2015-04-11

## 2015-02-11 ENCOUNTER — Encounter (HOSPITAL_BASED_OUTPATIENT_CLINIC_OR_DEPARTMENT_OTHER): Payer: Self-pay | Admitting: Gastroenterology

## 2015-02-11 ENCOUNTER — Ambulatory Visit (HOSPITAL_BASED_OUTPATIENT_CLINIC_OR_DEPARTMENT_OTHER)
Admit: 2015-02-11 | Disposition: A | Discharge status: Home / Self Care | Payer: Self-pay | Source: Ambulatory Visit | Attending: Gastroenterology | Admitting: Gastroenterology

## 2015-02-11 MED ORDER — MIDAZOLAM HCL 2 MG/2ML IJ SOLN
Freq: Once | INTRAMUSCULAR | Status: AC | PRN
Start: 2015-02-11 — End: 2015-02-11
  Administered 2015-02-11: 1 mg via INTRAVENOUS

## 2015-02-11 MED ORDER — FENTANYL CITRATE 0.05 MG/ML IJ SOLN
Freq: Once | INTRAMUSCULAR | Status: AC | PRN
Start: 2015-02-11 — End: 2015-02-11
  Administered 2015-02-11: 25 ug via INTRAVENOUS

## 2015-02-11 MED ORDER — MIDAZOLAM HCL 5 MG/5ML IJ SOLN
INTRAMUSCULAR | Status: DC
Start: 2015-02-11 — End: 2015-02-12
  Filled 2015-02-11: qty 10

## 2015-02-11 MED ORDER — FENTANYL CITRATE 0.05 MG/ML IJ SOLN
INTRAMUSCULAR | Status: DC
Start: 2015-02-11 — End: 2015-02-12
  Filled 2015-02-11: qty 4

## 2015-02-11 MED ORDER — SODIUM CHLORIDE 0.9 % IV SOLN
INTRAVENOUS | Status: DC
Start: 2015-02-11 — End: 2015-02-12
  Administered 2015-02-11: 12:00:00 via INTRAVENOUS

## 2015-02-11 NOTE — H&P (Signed)
GI Pre-procedure History and Physical Short Form  Stephanie [name] is an54 year old female.    Chief Complaint: She is being scheduled for Colonoscopy    The history is provided by the patient. No language interpreter was used.           Active Problems:  Patient Active Problem List:     Anemia, unspecified     Urinary calculus, unspecified     Overweight(278.02)     Pure hypercholesterolemia     Family history of malignant neoplasm of gastrointestinal tract     Leiomyoma of uterus, unspecified     Vitamin D deficiency     Major depressive disorder     Irritable bladder     Elevated hemoglobin A1c     Other plastic surgery for unacceptable cosmetic appearance     Rotator cuff tear arthropathy of right shoulder     AC joint arthropathy     Impingement syndrome of right shoulder     Hematest positive stools     Tubular adenoma of colon     Diverticulosis of colon     Hyperopia with astigmatism and presbyopia     Immature cataract     Hypertension, goal below 140/90     Mass of hand      History (Medical, Surgical, Social, Family):    Past Medical History    Depression     Hyperopia with astigmatism and presbyopia 11/21/2012    Immature cataract 11/21/2012       Past Surgical History    OB ANTEPARTUM CARE CESAREAN DLVR & POSTPARTUM      Comment x3    TOTAL ABDOMINAL HYSTERECT W/WO RMVL TUBE OVARY      Comment for fibroids, still has cervix    BREAST ENHANCEMENT SURGERY      Comment 12/12 had bilateral mammoplasty and saline implants, had 2nd surgery 5/30 for revision       Social History   Marital status: Divorced  Spouse name: N/A    Years of education: N/A  Number of children: N/A     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; 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     Social History Main Topics   Smoking status: Former Smoker     Types: Cigarettes    Quit date: 07/27/2001    Smokeless tobacco: Not on file    Alcohol use Yes  10.0 oz/week     Comment: occasional, weekends    Drug use: No    Sexual activity: Not Currently     Partners: Male    Comment: G3P3, no hx STD, no hx abn Pap     Other Topics Concern   None on file     Social History Narrative       Family History    Cancer - Other Father     Comment: stomach    Heart Brother     Comment: died at 93 during valve replacement    Heart Brother     Comment: chestpain with neg w/u    Heart Mother     Comment: Died of MI age 50       Allergies:   Review of Patient's Allergies indicates:  No Known Allergies    Medications:     Current Outpatient Prescriptions:  omeprazole (PRILOSEC) 20 MG capsule Take 1 capsule by mouth daily Disp: 30 capsule Rfl: 6   simethicone (MYLICON) 80 MG chewable tablet Take 1 tablet by mouth every 6 (six)  hours as needed for Flatulence Disp: 120 tablet Rfl: 0   mirtazapine (REMERON) 15 MG tablet Take 1 tablet by mouth nightly Disp: 30 tablet Rfl: 0   polyethylene glycol-electrolytes (NULYTELY WITH FLAVOR PACKS) 420 G solution Take as directed prior to colonoscopy Disp: 1 Bottle Rfl: 0   OMEGA 3 1000 MG OR CAPS 1 taily Disp:  Rfl:      Current Facility-Administered Medications:  sodium chloride 0.9% infusion  Intravenous Continuous Junius Argyle Last Rate: 50 mL/hr at 02/11/15 1148   midazolam (VERSED) 5 MG/5ML injection        fentaNYL (SUBLIMAZE) 0.05 MG/ML injection            Vitals:  BP 140/86  Pulse 80  Temp 98.6 F (37 C) (Temporal)  Resp 22  Ht 4' 11.75" (1.518 m)  Wt 69.9 kg (154 lb)  LMP 07/11/2005  SpO2 100%  BMI 30.33 kg/m2    Review of Systems   Constitutional: Negative for appetite change, fatigue and fever.   HENT: Negative.    Respiratory: Negative.    Cardiovascular: Negative.    Gastrointestinal: Negative.    Neurological: Negative.        Physical Exam   Constitutional: She is oriented to person, place, and time. She appears well-developed and well-nourished. No distress.   HENT:   Head: Normocephalic and atraumatic.   Mouth/Throat: Oropharynx is clear and moist.   Eyes: Conjunctivae are normal.   Neck: Normal range of motion. Neck supple.    Cardiovascular: Normal rate and normal heart sounds.    No murmur heard.  Pulmonary/Chest: Effort normal and breath sounds normal.   Abdominal: Soft. Bowel sounds are normal. She exhibits no mass (No organomegaly).   Musculoskeletal: She exhibits no edema.   Neurological: She is alert and oriented to person, place, and time.   Skin: Skin is warm and dry. She is not diaphoretic.   Nursing note and vitals reviewed.         Airway Evaluation:  Gag reflex intact: Yes  Ability to open mouth wide:   Full  Dentures:  No  Loose teeth:  No  Neck range of motion  Full    Mallampati AirwayClassification: Class I     A soft palate, fauces, uvula, anterior and posterior tonsil pillars are seen.  Mallampati Airway Classification:     ASA Classification: ASA Class II (a patient with mild systemic disease)    Assessment:  Proceed with procedure

## 2015-02-11 NOTE — Discharge Instructions (Signed)
INSTRUÇÕ[facility]A [facility] DISCHARGE INSTRUCTIONS    · Quando você for para casa é possível que se sinta sonolento(a). Descanse bastante pelo resto do dia.   When you return home you may feel sleepy. Get plenty of rest for the remainder of the day.    · Se você tiver recebido algum sedative para o procedimento NÃO DIRIJA, NÃO  MANUSEIE MÁQUINAS NEM TOME NENHUMA DECISÃO IMPORTANTE durante o resto do dia.  If you received sedation for your procedure DO NOT DRIVE, OPERATE MACHINERY, OR MAKE IMPORTANT DECISIONS for the remainder of the day.    · Depois ter feito uma COLONOSCOPIA é normal ter gases e sentir inchaço abdominal, mas se você tiver uma DOR ABDOMINAL SEVERA entre em contato com o seu médico imediatamente.   It is normal after having a COLONOSCOPY to feel a little gassy and bloated, but if you develop SEVERE ABDOMINAL PAIN call your doctor immediately.     · Depois ter feito uma COLONOSCOPIA é normal ver uma pequena quantidade de sangue após as primeiras evacuações, mas se você vir uma QUANTIDADE GRANDE DE SANGUE VERMELHO VIVO, entre em contato com o seu médico imediatamente.  It is normal after having a COLONOSCOPY to see a small amount of blood after your first few bowel movements, but, if you see a LARGE AMOUNT OF BRIGHT RED BLOOD, call your doctor immediately.    · Após ter feito uma ENDOSCOPIA DIGESTIVA ALTA (GASTROENTEROSCOPIA) é normal se ter uma dor de garganta branda que pode durar por alguns dias, mas se você tiver  DORES NO PEITO, FALTA DE AR, DIFICULDADE PARA ENGOLIR, VÔMITOS DE SANGUE VERMELHO VIVO OU SE NOTAR AS FEZES NEGRAS OU MARROM ESCURAS OU SE VOCÊ SE SENTIR FRACO(A) OU CANSADO(A), entre em contato com o seu médico imediatamente.    It is normal after having an UPPER ENDOSCOPY to develop a mild sore throat that will last for a few days, but, if you develop CHEST PAIN, SHORTNESS OF BREATH, DIFFICULTY SWALLOWING, VOMIT BRIGHT RED BLOOD, NOTICE YOUR BOWEL  MOVEMENTS ARE BLACK OR MAROON COLORED OR YOU FEEL WEAK AND TIRED, call your doctor immediately.    · Se tiver feito uma biópsia ou Polipectomia você terá que suspender a aspirina ou os medicamentos que contenham aspirina por 0 dias.                    · If you had a biopsy or Polypectomy you will need to hold aspirin or medication containing aspirin for _0__days.    · Se você tiver feito uma biópsia ou Polipectomia você terá que suspender qualquer medicamento tais como Advil, Motrin, Naproxin, Ibuprofen etc por 0 dias.  If you had a biopsy or Polypectomy you will need to hold any medication such as Advil, Motrin, Naproxin, Ibuprofen etc for_0__days.    · Entre em contato com o seu médico se houver qualquer outro sintoma fora do normal.  Call you physician for any other unusual symptoms.    · Se por qualquer razão você não conseguir entrar em contato com o seu médico vá para a sala de emergência mais próxima.   If for any reason you are unable to reach your doctor go to the nearest Emergency Room.    Instruções Específicas:***  Specific Instructions:

## 2015-02-11 NOTE — PROVATION-GI (Signed)
[facility]  Patient Name: Stephanie [name]  MRN: [id number]  Account Number: [id number]  Gender: Female  Age: 54  Date of Birth: [birth year]  Admit Type: Outpatient  Patient Location: [facility]  Note Status: Finalized  Referring MD:         Lemar Livings, Ella Bodo  Procedure Date:       02/11/2015 2:29:59 PM  Procedure:            Colonoscopy  Endoscopist:          Junius Argyle, MD  Indications for Procedure:       High risk colon cancer surveillance: Personal history of colonic polyps  Medications:          Midazolam 6 mg IV, Fentanyl 125 micrograms IV  Procedure:       Just prior to the procedure, an updated history and physical was done. I        obtained an informed consent from the patient reviewing the risk of the        procedure including (but not limited to) respiratory depression, perforation,        bleeding, discomfort, a possible need for surgery and unexpected reactions to        medications. The patient is aware that test has limitations and may not        detect significant lesions such as cancer or other potential diseases. The        patient was also informed that they might need a repeat colonoscopy earlier        than standard guidelines if there are changes in their symptoms or concerning        findings noted. A time out was performed with the entire procedure staff        present. The scope was passed under direct vision. Throughout the procedure,        the patient's blood pressure, pulse, and oxygen saturations were monitored        continuously. The [id number] was introduced through the anus and        advanced to the cecum, identified by appendiceal orifice and ileocecal valve.        The scope was then slowly withdrawn with confirmation of the noted findings.        The colonoscopy was performed without difficulty. The patient tolerated the        procedure well. The quality of the bowel preparation was fair. The total        duration of the procedure was 25 minutes. Scope  withdrawal time was 16        minutes.  Findings:       A 5 mm polyp was found in the sigmoid colon. The polyp was flat. The polyp        was removed with a jumbo cold forceps. Resection and retrieval were complete.  Post Procedure Diagnosis:       - One 5 mm polyp in the sigmoid colon. Resected and retrieved.  Complications:        No immediate complications.  Recommendation:       - Await pathology results.       - Return to GI clinic at appointment to be scheduled.       - Repeat colonoscopy in 5 years for surveillance.  Junius Argyle, MD  02/11/2015 3:13 PM  This report has been signed electronically.  Number of Addenda: 0  Note Initiated On: 02/11/2015 2:29 PM

## 2015-02-13 LAB — SURGICAL PATH SPECIMEN

## 2015-02-18 ENCOUNTER — Encounter (HOSPITAL_BASED_OUTPATIENT_CLINIC_OR_DEPARTMENT_OTHER): Payer: Self-pay | Admitting: Gastroenterology

## 2015-02-27 ENCOUNTER — Encounter (HOSPITAL_BASED_OUTPATIENT_CLINIC_OR_DEPARTMENT_OTHER): Payer: Self-pay | Admitting: Family Medicine

## 2015-02-27 ENCOUNTER — Ambulatory Visit (HOSPITAL_BASED_OUTPATIENT_CLINIC_OR_DEPARTMENT_OTHER): Payer: PRIVATE HEALTH INSURANCE | Admitting: Ophthalmology

## 2015-02-28 NOTE — Progress Notes (Signed)
This case was reviewed in its entirety. I agree with the plan and follow up as is noted in the pharmacist note.

## 2015-03-04 ENCOUNTER — Ambulatory Visit (HOSPITAL_BASED_OUTPATIENT_CLINIC_OR_DEPARTMENT_OTHER): Payer: PRIVATE HEALTH INSURANCE | Admitting: Family Medicine

## 2015-03-04 ENCOUNTER — Other Ambulatory Visit (HOSPITAL_BASED_OUTPATIENT_CLINIC_OR_DEPARTMENT_OTHER): Payer: Self-pay | Admitting: Pulmonary

## 2015-03-04 DIAGNOSIS — G4733 Obstructive sleep apnea (adult) (pediatric): Principal | ICD-10-CM

## 2015-03-05 NOTE — Addendum Note (Signed)
Addended by: Karyl Kinnier on: 03/05/2015 09:37 PM     Modules accepted: Orders

## 2015-03-06 ENCOUNTER — Ambulatory Visit (HOSPITAL_BASED_OUTPATIENT_CLINIC_OR_DEPARTMENT_OTHER): Payer: PRIVATE HEALTH INSURANCE | Admitting: Neurology

## 2015-03-06 ENCOUNTER — Encounter (HOSPITAL_BASED_OUTPATIENT_CLINIC_OR_DEPARTMENT_OTHER): Payer: Self-pay | Admitting: Neurology

## 2015-03-06 VITALS — BP 130/80 | HR 80 | Wt 157.0 lb

## 2015-03-06 DIAGNOSIS — R413 Other amnesia: Secondary | ICD-10-CM

## 2015-03-06 DIAGNOSIS — G4733 Obstructive sleep apnea (adult) (pediatric): Secondary | ICD-10-CM

## 2015-03-06 DIAGNOSIS — R4589 Other symptoms and signs involving emotional state: Secondary | ICD-10-CM

## 2015-03-06 DIAGNOSIS — H81392 Other peripheral vertigo, left ear: Principal | ICD-10-CM

## 2015-03-06 LAB — VITAMIN B12: VITAMIN B12: 466 pg/mL (ref 193–986)

## 2015-03-06 MED ORDER — MECLIZINE HCL 25 MG PO TABS
12.5000 mg | ORAL_TABLET | Freq: Three times a day (TID) | ORAL | 2 refills | Status: AC | PRN
Start: 2015-03-06 — End: 2015-04-06

## 2015-03-06 NOTE — Progress Notes (Signed)
Neurology Consultation    Stephanie [name] is a 54 year old right-handed female, sent for consultation by Tawana Scale. [place], MD for follow up of  Vertigo and related symptoms.    HPI:     When she was last seen, she had described symptoms of dizziness as well as a true spinning sensation always occurring when she moves her head or in bed.  I performed the Marye Round, which suggested a left peripheral vestibulopathy, and for this as well as some ENT symptoms, I referred her to ENT for vestibular rehabilitation.  In addition, she described excessive snoring and daytime drowsiness.  I ordered a sleep study, to further evaluate whether she has obstructive sleep apnea.  I also sent her for MRI of the brain which showed minimal periventricular and subcortical white matter change, as well as bilateral mastoid air cell disease.  She had described a sense of head fullness, and some general forgetfulness mainly when she is multitasking.  She tells me yesterday was " not good". She noted feeling as if she would "faint". She felt lightheaded, and tired.Last night  She did the second part of her sleep study, using the CPAP machine, which she said was helpful. The study revealed OSA, and strongly suggested she use the machine. She said she does not have the machine at home and nobody explained to her when "the machine" will come.    Past medical history, social and family history: unchanged      Current Outpatient Prescriptions:  polyethylene glycol-electrolytes (NULYTELY WITH FLAVOR PACKS) 420 G solution Take as directed prior to colonoscopy Disp: 1 Bottle Rfl: 0   omeprazole (PRILOSEC) 20 MG capsule Take 1 capsule by mouth daily Disp: 30 capsule Rfl: 6   simethicone (MYLICON) 80 MG chewable tablet Take 1 tablet by mouth every 6 (six) hours as needed for Flatulence Disp: 120 tablet Rfl: 0   mirtazapine (REMERON) 15 MG tablet Take 1 tablet by mouth nightly Disp: 30 tablet Rfl: 0   OMEGA 3 1000 MG OR CAPS  1 taily Disp:  Rfl:      No current facility-administered medications for this visit.     Review of Patient's Allergies indicates:  No Known Allergies    ROS: Rest of systems were reviewed and negative. Rest as per HPI.    EXAM: LMP 07/11/2005   General: moderately built and nourished, in NAD  Eyes: PERRLA, no icterus  HEENT: atraumatic, normocephalic  Neck: supple  Extremities: no edema  Skin: no rash, warm, well perfused  Neuro:  Mental status: Awake, alert and oriented; speech is clear and her language is fluent.  She generally answers questions well, but is somewhat vague with details.  Cranial nerves: PERRLA, EOMI, visual fields full, face symmetric and sensations intact. No dysarthria.  Motor: Normal bulk and tone in all extremities. Full strength throughout.   Reflexes: symmetric toes down going  Sensory: Intact to all touch throughout. Negative Romberg.  Coordination and Gait: normal.  Moving around the room did not cause any dizziness.  Her gait was normal base.  She had no difficulty with coordination.    LABS AND IMAGING REVIEWED:     Exam Date: 12/27/14  Exam Status: Signed      Exam: MRI BRAIN NON CONTRAST    Reason for Exam: vertigo, memory loss              CLINICAL INDICATION: Vertigo and memory loss.      No prior studies for  comparison.      TECHNIQUE:      Sagittal T1, axial T2, axial FLAIR, sagittal FLAIR, axial T2*, axial    T1 and axial diffusion weighted images through the brain were    performed.      FINDINGS:      There are no intra or extra-axial masses. The ventricular system and    cisterns are unremarkable. Scattered minimal periventricular and    subcortical small high FLAIR signal intensity rounded foci measuring 1    to 4 mm in both hemispheres are present. No diffusion abnormalities    are appreciated. No blood or blood products on the T2* weighted images    are appreciated. The flow-void characteristics of the intracranial    arterial system appear unremarkable.       There is moderate mucosal thickening of the ethmoid sinuses and mild    mucosal thickening of the maxillary sinuses. The remainder of the    sinuses appear clear. Scattered fluid signal intensity throughout the    mastoid air cells bilaterally is present.      IMPRESSION:      Minimal periventricular and subcortical white matter signal intensity    foci may be seen in the setting of headaches or mild early chronic    small vessel ischemic change. A demyelinating process is less likely    given nonspecific morphology.      Bilateral mastoid air cell disease.        [place]   Technical Recording Summary   The patient underwent all night polysomnography (16 channels) monitoring which recorded six channels of EEG, two channels of EOG, three channels of EMG (one sub-mental EMG, right and left anterior tibialis EMG), airflow (nasal thermistor and pressure cannula), two channels of respiratory effort (inductance plethysmography), ECG, snoring, pulse oximetry and body position.   Sleep Architecture   Lights were turned out at 10:30:56 PM and turned on at 5:24:56 AM. The patient slept for 356.5 minutes witha sleep efficiency of 86 %, which is reduced. Sleep latency was 25.5 minutes which is increased and latency to REM was 246.29minutes which is increased. Sleep architecture as a percentage of total sleep time is as follows: 5 % was spent in Stage N1, 45 % was spent in Stage N2, 38 % was spent in Stage N3 and 12 % was REM sleep. The spontaneous arousal index was 28.3.   Respiratory Measurements   There were 0 apneas, 55 hypopneas and 67 RERAs. Of these events, 0 were central apneas. Cheyne-Stokes respirations were not observed. The resulting AHI was 9 per hour of sleep and the RDI was 21 per hour of sleep. The baseline oxygen saturation was 93% and the nadir oxygen saturation was 85%. The sleep time included supine and lateral body positions. REM/supine sleep was not  observed. The respiratory events were not positionally dependent. Patient had 0.1 minutes of total sleep time with an oxygen saturation less than 90%. Moderate snoring was recorded during this study.   Periodic Limb Movements   The PLM index was 8.1 per hour of sleep. The PLM index that caused arousals was 1.5per hour of sleep.   ECG/Heart Rate   The patient demonstrates normal sinus rhythm and a mean sleeping heart rate of 75/min.   Impression/diagnosis:   Obstructive Sleep Apnea (327.23)   Recommendations:   Study shows mild OSA with no change with position. Non supine REM sleep was observed and OSA was moderate in REM  sleep ( AHI = 23).   1. CPAP would be warranted and would require a titration study.   2. Should the patient not tolerate CPAP, other options may include upper airway surgery or possibly an oral appliance.   3. Maintenance of nasal patency, avoidance of evening alcohol and weight loss may also be beneficial.   4. Avoid drowsy driving   5. Sleep Hygiene should be reviewed as clinically indicated   6. Follow-up with referring physician.   We will call and contact her re cpap titration.   Electronically signed by:   Burna Sis, MD, MS   Diplomate, [facility].     05/20/2014 2:29 PM - User Interface   Component Results   Component Value Flag Ref Range Units Status   THYROID SCREEN TSH REFLEX FT4 0.482          /21/2015 2:19 PM - User Interface   Component Results   Component Value Flag Ref Range Units Status   WHITE BLOOD CELL COUNT 8.5  4.0 - 11.0 TH/uL Final   RED BLOOD CELL COUNT 4.41  3.90 - 5.20 M/uL Final   HEMOGLOBIN 13.6  11.2 - 15.7 g/dL Final   HEMATOCRIT 41.3  34.1 - 44.9 % Final   MEAN CORPUSCULAR VOL 93.7  80.0 - 100.0 fL Final   MEAN CORPUSCULAR HGB 30.8  26.0 - 34.0 pg Final   MEAN CORP HGB CONC 32.9  31.0 - 37.0 g/dL Final   RBC DISTRIBUTION WIDTH STD DEV 45.6  35.1 - 46.3           ASSESSMENT AND PLAN:     I reviewed with Ayssa a number of tests and issues.  Her sleep  study suggested obstructive sleep apnea, and she underwent CPAP titration.  She said she was given a mask but "no machines".  I explained to her that is likely the machine is going to be delivered to her.  She said she is hoping that she will be able to wear the mask, as she notes significant daytime drowsiness.  I explained the importance of addressing and treating sleep apnea, and told her that she will be able to work with the CPAP company to assure the machine and mask are comfortable to use.  Regarding her description of dizziness, I am going to have her try low-dose meclizine.  She saw her ENT, but they did not address her issues.  I also discussed with her that it may be beneficial to send her for vestibular therapy.  I attempted to describe the Epley maneuver to her, but she was not able to understand how to do this.  I also encouraged her to maintain her hydration, as she does not drink much water.  She notes that she feels her memory is in "very bad".  I am going to check a few laboratory studies.  I reviewed the MRI of her brain with her, and told to please keep track of issues that she thinks are difficult for her.  She says that she is working, and dizzy, and she is able to keep up a busy life and does not have any difficulty with practical issues at this time.  She does note that she is somewhat depressed.  I asked her if she has support systems at home, and she said she has no one to lean on.  She asked if I could refer her to psychotherapy, and I told her that I certainly would.  She feels this will  be very helpful.    Please feel free to contact me with any questions or concerns. I spent 40 minutes in face-to-face time with the patient, >50% of which was spent counseling and coordinating care.    Verdene Lennert MD

## 2015-03-06 NOTE — Progress Notes (Signed)
Patient feels safe at home.

## 2015-03-07 ENCOUNTER — Ambulatory Visit (HOSPITAL_BASED_OUTPATIENT_CLINIC_OR_DEPARTMENT_OTHER): Payer: PRIVATE HEALTH INSURANCE | Admitting: Ophthalmology

## 2015-03-07 LAB — HEMOGLOBIN A1C
ESTIMATED AVERAGE GLUCOSE: 114 (ref 74–160)
HEMOGLOBIN A1C: 5.6 % (ref 4.0–5.6)

## 2015-03-10 ENCOUNTER — Ambulatory Visit (HOSPITAL_BASED_OUTPATIENT_CLINIC_OR_DEPARTMENT_OTHER): Payer: PRIVATE HEALTH INSURANCE | Admitting: Family Medicine

## 2015-03-13 ENCOUNTER — Ambulatory Visit (HOSPITAL_BASED_OUTPATIENT_CLINIC_OR_DEPARTMENT_OTHER): Payer: Self-pay | Admitting: Pulmonary

## 2015-03-13 DIAGNOSIS — G4733 Obstructive sleep apnea (adult) (pediatric): Principal | ICD-10-CM

## 2015-03-18 ENCOUNTER — Other Ambulatory Visit (HOSPITAL_BASED_OUTPATIENT_CLINIC_OR_DEPARTMENT_OTHER): Payer: Self-pay

## 2015-03-18 NOTE — Telephone Encounter (Signed)
Thank you for your referral. We tried to contact patient today and a message was left to contact the [place] team at [phone].

## 2015-03-19 ENCOUNTER — Encounter (HOSPITAL_BASED_OUTPATIENT_CLINIC_OR_DEPARTMENT_OTHER): Payer: Self-pay

## 2015-03-19 LAB — CPAP TITRATION

## 2015-03-19 NOTE — Telephone Encounter (Signed)
Thank you for your referral. We were able to contact your patient and she was given an appointment for August 17th with Lindaann Pascal at the [place] Team.

## 2015-03-19 NOTE — Progress Notes (Signed)
The patient did well on CPAP 11cmH20. We will set this up.  She will need a 3 month follow up to assess response to CPAP. Would you mind placing a referral?    Thank you,  Kellie Moor, MD

## 2015-03-24 ENCOUNTER — Encounter (HOSPITAL_BASED_OUTPATIENT_CLINIC_OR_DEPARTMENT_OTHER): Payer: Self-pay

## 2015-03-24 ENCOUNTER — Ambulatory Visit (HOSPITAL_BASED_OUTPATIENT_CLINIC_OR_DEPARTMENT_OTHER): Payer: PRIVATE HEALTH INSURANCE

## 2015-03-24 VITALS — BP 146/88 | HR 92 | Temp 97.7°F | Wt 159.0 lb

## 2015-03-24 DIAGNOSIS — R0602 Shortness of breath: Secondary | ICD-10-CM

## 2015-03-24 DIAGNOSIS — E663 Overweight: Secondary | ICD-10-CM

## 2015-03-24 DIAGNOSIS — E7800 Pure hypercholesterolemia, unspecified: Secondary | ICD-10-CM

## 2015-03-24 DIAGNOSIS — I10 Essential (primary) hypertension: Principal | ICD-10-CM

## 2015-03-24 LAB — CBC, PLATELET & DIFFERENTIAL
ABSOLUTE BASO COUNT: 0 10*3/uL (ref 0.0–0.1)
ABSOLUTE EOSINOPHIL COUNT: 0.2 10*3/uL (ref 0.0–0.8)
ABSOLUTE IMM GRAN COUNT: 0.02 10*3/uL (ref 0.00–0.03)
ABSOLUTE LYMPH COUNT: 2.9 10*3/uL (ref 0.6–5.9)
ABSOLUTE MONO COUNT: 0.5 10*3/uL (ref 0.2–1.4)
ABSOLUTE NEUTROPHIL COUNT: 4.1 10*3/uL (ref 1.6–8.3)
BASOPHIL %: 0.5 % (ref 0.0–1.2)
EOSINOPHIL %: 2.3 % (ref 0.0–7.0)
HEMATOCRIT: 40.9 % (ref 34.1–44.9)
HEMOGLOBIN: 13.7 g/dL (ref 11.2–15.7)
IMMATURE GRANULOCYTE %: 0.3 % (ref 0.0–0.4)
LYMPHOCYTE %: 37.3 % (ref 15.0–54.0)
MEAN CORP HGB CONC: 33.5 g/dL (ref 31.0–37.0)
MEAN CORPUSCULAR HGB: 31.7 pg (ref 26.0–34.0)
MEAN CORPUSCULAR VOL: 94.7 fL (ref 80.0–100.0)
MEAN PLATELET VOLUME: 12.2 fL (ref 8.7–12.5)
MONOCYTE %: 6.6 % (ref 4.0–13.0)
NEUTROPHIL %: 53 % (ref 40.0–75.0)
PLATELET COUNT: 281 10*3/uL (ref 150–400)
RBC DISTRIBUTION WIDTH STD DEV: 46.6 fL — ABNORMAL HIGH (ref 35.1–46.3)
RBC DISTRIBUTION WIDTH: 13.3 % (ref 11.5–14.3)
RED BLOOD CELL COUNT: 4.32 M/uL (ref 3.90–5.20)
WHITE BLOOD CELL COUNT: 7.8 10*3/uL (ref 4.0–11.0)

## 2015-03-24 NOTE — Progress Notes (Signed)
Nursing appointment    SUBJECTIVE:  Stephanie [name] is a 54 year old female who presents for bloodwork for an appointment with her nutritionist.   Needs thyroid, cholesterol, anemia.     Also c/o SOB, states that she has pain in her upper sternum and upper back when she takes deep breaths.  SOB is longstanding, daily, when pt talks, occasional coughing, gets tired easily.  Pt states that she did a sleep study and was told to use CPAP but isn't at the moment.      PROBLEMS:   Patient Active Problem List:     Anemia, unspecified     Urinary calculus, unspecified     Overweight(278.02)     Pure hypercholesterolemia     Family history of malignant neoplasm of gastrointestinal tract     Leiomyoma of uterus, unspecified     Vitamin D deficiency     Major depressive disorder     Irritable bladder     Elevated hemoglobin A1c     Other plastic surgery for unacceptable cosmetic appearance     Rotator cuff tear arthropathy of right shoulder     AC joint arthropathy     Impingement syndrome of right shoulder     Hematest positive stools     Tubular adenoma of colon     Diverticulosis of colon     Hyperopia with astigmatism and presbyopia     Immature cataract     Hypertension, goal below 140/90     Mass of hand      PAST MEDICAL, SURGICAL, FAMILY, SOCIAL, IMMUNIZATION, HEALTH MAINT HISTORY: The following sections have been reviewed and updated in Epic:  past medical history, past surgical history, family history, social history    MEDICATIONS:   Current Outpatient Prescriptions on File Prior to Visit:  meclizine (ANTIVERT) 25 MG TABS Take 0.5 tablets by mouth every 8 (eight) hours as needed Disp: 40 tablet Rfl: 2   polyethylene glycol-electrolytes (NULYTELY WITH FLAVOR PACKS) 420 G solution Take as directed prior to colonoscopy Disp: 1 Bottle Rfl: 0   omeprazole (PRILOSEC) 20 MG capsule Take 1 capsule by mouth daily Disp: 30 capsule Rfl: 6   mirtazapine (REMERON) 15 MG tablet Take 1 tablet by mouth nightly  Disp: 30 tablet Rfl: 0   OMEGA 3 1000 MG OR CAPS 1 taily Disp:  Rfl:      No current facility-administered medications on file prior to visit.      ALLERGIES:  Review of Patient's Allergies indicates:  No Known Allergies    OBJECTIVE:    VITALS:  BP 146/88  Pulse 92  Temp 97.7 F (36.5 C) (Temporal)  Wt 72.1 kg (159 lb)  LMP 07/11/2005  SpO2 98%  BMI 31.31 kg/m2  General: Well appearing, no apparent distress. Alert and oriented x 3.  Eyes: PERRLA, EOMI. No scleral icterus.  Mouth: MMM, normal oropharynx  Neck: supple, thyroid normal, no significant cervical lymphadenopathy  Heart: regular rate and rhythm, normal S1 and S2, no murmurs, rubs or gallops.  Lungs:  Clear to auscultation bilaterally, no wheezing or rales  Neuro: Grossly normal strength and sensation. Normal gait  Extremities: warm, well perfused, no edema or cyanosis.   Psych: Normal affect, normal insight and judgment.     ASSESSMENT/PLAN:   PROBLEMS:   Stephanie [name] is a 54 year old female here for  (E66.3) Overweight(278.02)  (E78.0) Pure hypercholesterolemia  Comment: Planning to see nutritionist and needs thyroid, cholesterol, anemia bloodwork  for that appointment.    Plan:   - CBC + PLT + AUTO DIFF,   - TSH (THYROID STIMULATING        HORMONE),   - CHOLESTEROL  - Pt will come pick up results on Thursday     (R06.02) Shortness of breath  Comment: Patient appears to believe that her sx are 2/2 sleep apnea, but is having during the day.  Unclear etiology for the pain, which is in the upper sternum and upper thoracic back, ? Airway tightening, ? COPD with history of chronic non-productive cough.   No findings on exam today   Plan:   - Offered inhaler but patient declined   - PFT testing   - ED precautions          (I10) Hypertension, goal below 140/90  Comment: Patient does not want to take medications, was able to control htn  with diet and exercise previously.  Initial BP reading 154/97.  Repeat lower but still outside of range.     Plan:    - Counseled regarding salt in diet  - Nursing visit for HTN in 3 days when she comes to pick up testing results to talk about diet/exercise   - If BP >155/95, please offer HCTZ 25mg  QAM and talk about SEs like orthostatic sx and urination   - If BP >140/90 but less than above, or if pt refuses medication, please arrange follow up appt after 3 weeks to see if she is able to bring it down with diet and exercise.     1. The patient indicates understanding of these issues and agrees with the plan.   No apparent learning barriers were identified. I attempted to answer any questions regarding the diagnosis and the proposed treatment.  2.  The patient is given an After Visit Summary sheet that lists all of their medications with directions, their allergies, orders placed during this encounter, and follow- up instructions.    Electronically signed by: Wenda Low, PA-C, 03/25/2015 9:43 PM  This note is electronically signed in the electronic medical record.

## 2015-03-25 ENCOUNTER — Telehealth (HOSPITAL_BASED_OUTPATIENT_CLINIC_OR_DEPARTMENT_OTHER): Payer: Self-pay

## 2015-03-25 LAB — TSH (THYROID STIMULATING HORMONE): TSH (THYROID STIM HORMONE): 0.749 u[IU]/mL (ref 0.358–3.740)

## 2015-03-25 LAB — HIGH DENSITY LIPOPROTEIN: HIGH DENSITY LIPOPROTEIN: 53 mg/dL (ref 40–?)

## 2015-03-25 LAB — CHOLESTEROL: Cholesterol: 259 mg/dL (ref 0–239)

## 2015-03-25 LAB — LOW DENSITY LIPOPROTEIN DIRECT: LOW DENSITY LIPOPROTEIN DIRECT: 172 mg/dL (ref 0–189)

## 2015-03-25 NOTE — Progress Notes (Signed)
PFT scheduled at [facility] on Wednesday, 04/02/2015 at 12:45pm. Left voicemail w/ patient.

## 2015-03-27 ENCOUNTER — Encounter (HOSPITAL_BASED_OUTPATIENT_CLINIC_OR_DEPARTMENT_OTHER): Payer: Self-pay | Admitting: Registered Nurse

## 2015-03-27 ENCOUNTER — Ambulatory Visit (HOSPITAL_BASED_OUTPATIENT_CLINIC_OR_DEPARTMENT_OTHER): Payer: PRIVATE HEALTH INSURANCE | Admitting: Registered Nurse

## 2015-03-27 NOTE — Progress Notes (Signed)
Pt arrived for visit with RN today 03/27/15 however was unaware that she was booked to be seen, thought she was just picking up results from lab work.  Explained that she was scheduled to have a BP check, education/discussion etc as well as get her lab results.  Pt declined BP check at this time stating that she checks it at home and it's normal, has been getting "12 and 13" as readings. Explained to pt that BP consists of 3 digits for a top number and then a second number as well. Requested she return for BP check when she is comfortable doing so, she agreed and scheduled RN visit in 2 weeks, asked that she bring her home machine to visit, pt agreed  Results printed and provided to pt

## 2015-04-02 ENCOUNTER — Ambulatory Visit (HOSPITAL_BASED_OUTPATIENT_CLINIC_OR_DEPARTMENT_OTHER): Payer: Self-pay

## 2015-04-10 ENCOUNTER — Ambulatory Visit (HOSPITAL_BASED_OUTPATIENT_CLINIC_OR_DEPARTMENT_OTHER): Payer: PRIVATE HEALTH INSURANCE | Admitting: Registered Nurse

## 2015-04-10 ENCOUNTER — Encounter (HOSPITAL_BASED_OUTPATIENT_CLINIC_OR_DEPARTMENT_OTHER): Payer: Self-pay | Admitting: Registered Nurse

## 2015-04-10 ENCOUNTER — Telehealth (HOSPITAL_BASED_OUTPATIENT_CLINIC_OR_DEPARTMENT_OTHER): Payer: Self-pay

## 2015-04-10 VITALS — BP 118/93

## 2015-04-10 DIAGNOSIS — I10 Essential (primary) hypertension: Principal | ICD-10-CM

## 2015-04-10 NOTE — Progress Notes (Signed)
Discussed with patient via portuguese interpreter and booked an appt with Thayer Headings on Thu 04/17/15 @ 5:00 for f/up HTN/vertigo.

## 2015-04-10 NOTE — Progress Notes (Signed)
Hypertension Education/Patient Activation    Patient: Stephanie [name]              There is no height or weight on file to calculate BMI.      Blood Pressure Current: (!) 118/93    HYPERTENSION EDUCATION / PATIENT ACTIVATION:   Hypertension Education / Patient Education:   Basic HTN:     BP Goal:  <140/90    Hypertension Review:     Risk factor reviewed:  Family history, h/o tobacco use, quit 12 years ago, decreased physical activity, occasional ETOH     Review influence: Pt declines medications at this time, wishes to continue work on lifestyle. denies excessive stress, though intermittently experiences some stress. Cooks for self, feels she does not use a lot of salt, other seasonings mostly- not many canned foods/frozen     Medications:     Reviewed medications with schedule: No      Reviewed side effects of medications: No      Reviewed  BP tracking tool: Yes      Provided medication details: No      Physical Activities (Consider work activities also):     Current Physical Activity::  Walk    Frequency:  1-2 times/week    Duration each day:  30-60 minutes    Plan for this week:  Walk (Just started working with physical trainer Mon, Wed, Fri -uses machines and strength training. walks Tues/Thurs 40 minutes. )    Frequency:  2-4 times/week    Duration each day:  30-60 mins    Stress Management:     Stress Level:  Low    Stress management?:  Walking    Diet:     Current:  Meat, rice, vegetable.    What kinds of foods do you consider healthy:  Vegetables, fruits     Who primarily cooks:  Me    Food Source:  Production designer, theatre/television/film    Add salt to diet: No      Salt:  Unsure    Caffeine:  1 - 2 servings per day    Alcohol Frequency:  Once a week    Weight:     Weight reduction strategies:  Increase activity    Tobacco:     Previously quit: Yes      Barriers:     Barriers:  Difficult to communicate in [place] and Limited social support    Self Management Goals:     Plan:         No results for input(s): NA, K, CL, CO2,  BUN, CREAT, GLUCOSER, CA, WBC, HGB, HCT, PLTA, AST, ALT, TBILI, ALKPHOS in the last 72 hours.            Marland Kitchen

## 2015-04-10 NOTE — Progress Notes (Signed)
Last Fri at other provider's office BP was high again  170/130 she thinks.    Has a BP machine at home, thinks it may be broken.  "everybody in my family" has history of HTN  Mother with h/o HTN     Also c/o continued vertigo symptoms that come and go. Room spinning dizziness, notes that medication given by Med Spec didn't help her. Discussed vertigo diagnosis with patient and recommendations per Med Spec- encouraged hydration, slow position changes, rest.   She also c/o memory loss.   Advised would consult with PCP team and someone will contact her regarding plan, likely to follow up with PCP in a few weeks.     Pt declines starting medications at this time. Prefers to continue working on diet/exercise as she is just now increasing her physical activity. Will recheck BP in a few weeks and if still elevated at that time, reconsider medications.   Educated regarding risks of elevated BP.   Time spent face to face with patient 35 minutes

## 2015-04-15 ENCOUNTER — Encounter (HOSPITAL_BASED_OUTPATIENT_CLINIC_OR_DEPARTMENT_OTHER): Payer: Self-pay

## 2015-04-15 NOTE — Progress Notes (Signed)
L/m on voicemail for pt to call the office to reschedule appt with Thayer Headings. Provider will not be in Thursday evening

## 2015-04-16 ENCOUNTER — Ambulatory Visit (HOSPITAL_BASED_OUTPATIENT_CLINIC_OR_DEPARTMENT_OTHER): Payer: PRIVATE HEALTH INSURANCE

## 2015-04-17 ENCOUNTER — Ambulatory Visit (HOSPITAL_BASED_OUTPATIENT_CLINIC_OR_DEPARTMENT_OTHER): Payer: PRIVATE HEALTH INSURANCE | Admitting: Family Medicine

## 2015-05-26 ENCOUNTER — Ambulatory Visit (HOSPITAL_BASED_OUTPATIENT_CLINIC_OR_DEPARTMENT_OTHER): Payer: PRIVATE HEALTH INSURANCE | Admitting: Gastroenterology

## 2015-06-12 ENCOUNTER — Ambulatory Visit (HOSPITAL_BASED_OUTPATIENT_CLINIC_OR_DEPARTMENT_OTHER): Payer: PRIVATE HEALTH INSURANCE | Admitting: Neurology

## 2015-10-15 ENCOUNTER — Ambulatory Visit (HOSPITAL_BASED_OUTPATIENT_CLINIC_OR_DEPARTMENT_OTHER): Payer: Self-pay | Admitting: Family Medicine

## 2015-11-24 ENCOUNTER — Ambulatory Visit (HOSPITAL_BASED_OUTPATIENT_CLINIC_OR_DEPARTMENT_OTHER): Payer: Self-pay | Admitting: Family Medicine

## 2015-12-23 ENCOUNTER — Ambulatory Visit (HOSPITAL_BASED_OUTPATIENT_CLINIC_OR_DEPARTMENT_OTHER): Payer: Self-pay | Admitting: Family Medicine

## 2015-12-29 ENCOUNTER — Ambulatory Visit (HOSPITAL_BASED_OUTPATIENT_CLINIC_OR_DEPARTMENT_OTHER): Payer: Medicaid Other | Admitting: Family Medicine

## 2015-12-29 ENCOUNTER — Encounter (HOSPITAL_BASED_OUTPATIENT_CLINIC_OR_DEPARTMENT_OTHER): Payer: Self-pay | Admitting: Family Medicine

## 2015-12-29 VITALS — BP 122/86 | HR 85 | Temp 98.1°F | Wt 144.0 lb

## 2015-12-29 DIAGNOSIS — E079 Disorder of thyroid, unspecified: Secondary | ICD-10-CM | POA: Insufficient documentation

## 2015-12-29 DIAGNOSIS — K2970 Gastritis, unspecified, without bleeding: Principal | ICD-10-CM

## 2015-12-29 DIAGNOSIS — K2990 Gastroduodenitis, unspecified, without bleeding: Principal | ICD-10-CM

## 2015-12-29 DIAGNOSIS — I10 Essential (primary) hypertension: Secondary | ICD-10-CM

## 2015-12-29 DIAGNOSIS — M25511 Pain in right shoulder: Secondary | ICD-10-CM

## 2015-12-29 DIAGNOSIS — G8929 Other chronic pain: Secondary | ICD-10-CM

## 2015-12-29 MED ORDER — HYDROCHLOROTHIAZIDE 25 MG PO TABS
25.0000 mg | ORAL_TABLET | Freq: Every day | ORAL | 11 refills | Status: DC
Start: 2015-12-29 — End: 2016-12-10

## 2015-12-29 MED ORDER — THYROID 60 MG PO TABS
60.0000 mg | ORAL_TABLET | Freq: Every day | ORAL | 11 refills | Status: DC
Start: 2015-12-29 — End: 2016-12-10

## 2015-12-29 MED ORDER — CYCLOBENZAPRINE HCL 5 MG PO TABS
10.0000 mg | ORAL_TABLET | Freq: Three times a day (TID) | ORAL | 0 refills | Status: AC | PRN
Start: 2015-12-29 — End: 2016-03-30

## 2015-12-29 NOTE — Progress Notes (Signed)
S:pt with Patient Active Problem List:     Anemia, unspecified     Urinary calculus, unspecified     Overweight(278.02)     Pure hypercholesterolemia     Family history of malignant neoplasm of gastrointestinal tract     Leiomyoma of uterus, unspecified     Vitamin D deficiency     Major depressive disorder     Irritable bladder     Elevated hemoglobin A1c     Other plastic surgery for unacceptable cosmetic appearance     Rotator cuff tear arthropathy of right shoulder     AC joint arthropathy     Impingement syndrome of right shoulder     Hematest positive stools     Tubular adenoma of colon     Diverticulosis of colon     Hyperopia with astigmatism and presbyopia     Immature cataract     Hypertension, goal below 140/90     Mass of hand     Thyroid disorder    Pain in stomach   Burning with everything she eats  Has been going on for a while  Was taking omeprazole previously   Took for years and stopped to take zantac    Dizziness  Pt has f/u with neuro scheduled in fall but no showed  Unclear etiology/sx  Nothing glaring on MRI, but some changes that could be related with headaches/migraine pattern    Arm pain came back  Works with shoulder  No new injury  No numbness or tingling or weakness    BP 122/86   Pulse 85   Temp 98.1 F (36.7 C) (Temporal)   Wt 65.3 kg (144 lb)   LMP 07/11/2005   SpO2 99%   BMI 28.36 kg/m2  AOx3, NAD  HEENT: sclerae white, atraumatic, mmm  Cor: RRR, nl S1S2  Pulm: CTA bilat  Ext: pink, no clubbing, no edema  R shoulder with tenderness, good rom    (K29.70,  K29.90) Gastritis and gastroduodenitis  (primary encounter diagnosis)  Comment:   Plan: HELICOBACTER PYLORI IGG, SCHISTOSOMAL AB IGG,         STRONGYLOIDES AB IGG, REFERRAL TO         GASTROENTEROLOGY ( INT)        Ongoing but hasn't had treatment; has had colonoscopy last year    (E07.9) Thyroid disorder  Comment: restart    (M25.511,  G89.29) Chronic right shoulder pain  Comment:   Plan:        cyclobenzaprine (FLEXERIL)  5 MG tablet,         REFERRAL TO PHYSICAL THERAPY ( INT)        Consider ortho if not improving    (I10) Hypertension, goal below 140/90  Comment: switch from furosemide (unclear reason why she was on this  Plan: hydrochlorothiazide (HYDRODIURIL) 25 MG tablet          Care has bounced bt [place], [place] and is hopefully here for a little while at least  Will schedule for CPE/PAP and reconnect with specialty      We discussed the patients current medications. The patient expressed understanding and no barriers to adherence were identified.

## 2015-12-30 ENCOUNTER — Encounter (HOSPITAL_BASED_OUTPATIENT_CLINIC_OR_DEPARTMENT_OTHER): Payer: Self-pay | Admitting: Family Medicine

## 2016-01-06 LAB — STRONGYLOIDES AB IGG: STRONGYLOIDES AB IgG: 2.11 INDEX (ref 0.00–9.99)

## 2016-01-06 LAB — SCHISTOSOMAL AB IGG: SCHISTOSOMAL AB IgG: 26.67 INDEX — ABNORMAL HIGH (ref 0.00–8.99)

## 2016-01-06 LAB — HELICOBACTER PYLORI IGG: HELICOBACTER PYLORI IGG: NEGATIVE

## 2016-01-08 ENCOUNTER — Other Ambulatory Visit (HOSPITAL_BASED_OUTPATIENT_CLINIC_OR_DEPARTMENT_OTHER): Payer: Self-pay | Admitting: Family Medicine

## 2016-01-08 ENCOUNTER — Telehealth (HOSPITAL_BASED_OUTPATIENT_CLINIC_OR_DEPARTMENT_OTHER): Payer: Self-pay | Admitting: Clinic/Center

## 2016-01-08 DIAGNOSIS — B659 Schistosomiasis, unspecified: Principal | ICD-10-CM | POA: Insufficient documentation

## 2016-01-08 HISTORY — DX: Schistosomiasis, unspecified: B65.9

## 2016-01-08 MED ORDER — PRAZIQUANTEL 600 MG PO TABS
20.00 mg/kg | ORAL_TABLET | Freq: Three times a day (TID) | ORAL | 0 refills | Status: AC
Start: 2016-01-08 — End: 2016-01-09

## 2016-01-08 NOTE — Progress Notes (Signed)
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

## 2016-01-09 NOTE — Progress Notes (Signed)
Call transferred from the front desk staff with the interpreter on the line    Patient given abnormal test result for  schisto and the plan   The patient agrees to pick up rx from [place] and take as prescribed

## 2016-01-09 NOTE — Progress Notes (Signed)
Call via telephone interpreter  2nd message left for the patient to call the clinic for test result

## 2016-01-19 ENCOUNTER — Telehealth (HOSPITAL_BASED_OUTPATIENT_CLINIC_OR_DEPARTMENT_OTHER): Payer: Self-pay | Admitting: Registered Nurse

## 2016-01-19 NOTE — Progress Notes (Signed)
RX for Biltricide was sent and received on 01/08/16, RX was placed on hold as patient did not pick up RX.

## 2016-01-19 NOTE — Progress Notes (Signed)
Called and let patient know that medication are at [facility] outpatient pharmacy.  Answered all questions and concerns at this time. Encouraged patient to call back if new questions arise.

## 2016-01-19 NOTE — Telephone Encounter (Signed)
-----   Message from [facility] sent at 01/19/2016 11:54 AM EDT -----  Regarding: med question  Contact: [phone]  Stephanie [name] [id number], 55 year old, female    Calls today:  Medication Questions  Patient Calling    What medication do you have questions about: not listed  What is your question: She was told to go to the pharmacy to pick up medication but pharmacy didn't receive the rx.  Return phone number   Person calling on behalf of patient: Patient (self)    CALL BACK NUMBER:[phone]  Best time to call back:   Cell phone:   Other phone:    Patient's language of care: [place]    Patient needs a [place] interpreter.    Patient's PCP: Tawana Scale. Katy Apo, MD

## 2016-01-19 NOTE — Progress Notes (Signed)
Rx sent 5/11, message forwarded to [facility] to check with pharmacy

## 2016-02-05 ENCOUNTER — Ambulatory Visit (HOSPITAL_BASED_OUTPATIENT_CLINIC_OR_DEPARTMENT_OTHER): Payer: Medicaid Other | Admitting: Neurology

## 2016-02-06 ENCOUNTER — Ambulatory Visit (HOSPITAL_BASED_OUTPATIENT_CLINIC_OR_DEPARTMENT_OTHER): Payer: Medicaid Other | Admitting: Family Medicine

## 2016-02-06 VITALS — BP 129/79 | HR 86 | Temp 97.4°F | Wt 150.0 lb

## 2016-02-06 DIAGNOSIS — H8112 Benign paroxysmal vertigo, left ear: Secondary | ICD-10-CM

## 2016-02-06 DIAGNOSIS — E079 Disorder of thyroid, unspecified: Secondary | ICD-10-CM

## 2016-02-06 DIAGNOSIS — B659 Schistosomiasis, unspecified: Secondary | ICD-10-CM

## 2016-02-06 DIAGNOSIS — D126 Benign neoplasm of colon, unspecified: Secondary | ICD-10-CM | POA: Insufficient documentation

## 2016-02-06 DIAGNOSIS — IMO0002 Reserved for concepts with insufficient information to code with codable children: Secondary | ICD-10-CM

## 2016-02-06 DIAGNOSIS — E663 Overweight: Secondary | ICD-10-CM

## 2016-02-06 DIAGNOSIS — I10 Essential (primary) hypertension: Secondary | ICD-10-CM

## 2016-02-06 DIAGNOSIS — E7800 Pure hypercholesterolemia, unspecified: Secondary | ICD-10-CM

## 2016-02-06 DIAGNOSIS — M7541 Impingement syndrome of right shoulder: Principal | ICD-10-CM

## 2016-02-06 DIAGNOSIS — R7309 Other abnormal glucose: Secondary | ICD-10-CM

## 2016-02-06 LAB — BASIC METABOLIC PANEL
ANION GAP: 10 mmol/L (ref 5–15)
BUN (UREA NITROGEN): 17 mg/dL (ref 7–18)
CALCIUM: 9.1 mg/dL (ref 8.5–10.1)
CARBON DIOXIDE: 27 mmol/L (ref 21–32)
CHLORIDE: 104 mmol/L (ref 98–107)
CREATININE: 0.6 mg/dL (ref 0.4–1.2)
ESTIMATED GLOMERULAR FILT RATE: >60 mL/min (ref >60)
Glucose Random: 81 mg/dL (ref 74–160)
POTASSIUM: 4.3 mmol/L (ref 3.5–5.1)
SODIUM: 141 mmol/L (ref 136–145)

## 2016-02-06 LAB — CHOLESTEROL: Cholesterol: 210 mg/dL (ref 0–239)

## 2016-02-06 LAB — THYROID SCREEN TSH REFLEX FT4: THYROID SCREEN TSH REFLEX FT4: 0.785 u[IU]/mL (ref 0.358–3.740)

## 2016-02-06 LAB — HIGH DENSITY LIPOPROTEIN: HIGH DENSITY LIPOPROTEIN: 71 mg/dL (ref 40–?)

## 2016-02-06 LAB — LOW DENSITY LIPOPROTEIN DIRECT: LOW DENSITY LIPOPROTEIN DIRECT: 112 mg/dL (ref 0–189)

## 2016-02-06 MED ORDER — PHENTERMINE HCL 37.5 MG PO TABS
37.5000 mg | ORAL_TABLET | Freq: Every morning | ORAL | 0 refills | Status: DC
Start: 2016-02-06 — End: 2016-08-17

## 2016-02-06 NOTE — Patient Instructions (Addendum)
Brandt-Daroff maneuver     The Brandt-Daroff maneuver is performed in the same manner for right and left-sided posterior canalithiasis. The seated patient begins by rapidly lying on one side (1), and then waits until any provoked vertigo subsides. The patient then sits up again (2), and waits once more for vertigo to subside. The next move is to rapidly lie on the other side (3), wait for any vertigo to subside, and then sit up again (4). The sequence is repeated 10 to 20 times, up to three times a day, until the patient is asymptomatic. The symmetry of this maneuver makes it appropriate for either left or right-sided pathology, and it also makes this maneuver useful when one is uncertain as to the side of the canalithiasis.   Modified from Troost BT, Patton JM. Exercise therapy for positional vertigo. Neurology 1992; 42:1441.   Graphic 63738 Version 2.0

## 2016-02-06 NOTE — Progress Notes (Signed)
LMP 07/11/2005    S:    Stephanie [name] Stephanie [name] is a 55 year old female who presents with:      1. Just moved back     Was getting a medicine for weight thyroid and pressure -   Saw a girl here that she saw -     Was taking phentermine -   4 months was taking - lost 23 lbs -   Gained back 6 lbs  Estimated body mass index is 28.36 kg/(m^2) as calculated from the following:    Height as of 02/11/15: 4' 11.75" (1.518 m).    Weight as of 12/29/15: 65.3 kg (144 lb).  Doesn't take phentermine every day       Arm is extremely painful   Getting a medication from [place] - meloxicam   Already got it from [place] - took 2 and already better    Pain in the top of arm 15th will start PT   Hard time to make apt   Wasn't even sleeping   Even with some pain in the chest  Took medicines Tuesday     One side of face - when moves her head to L vertigo -         Past Medical History:  No date: Depression  11/21/2012: Hyperopia with astigmatism and presbyopia  11/21/2012: Immature cataract      Family History    Cancer - Other Father     Comment: stomach    Heart Brother     Comment: died at 38 during valve replacement    Heart Brother     Comment: chestpain with neg w/u    Heart Mother     Comment: Died of MI age 53        Smoking status: Former Smoker                                                              Packs/day: 0.00      Years: 0.00         Types: Cigarettes     Quit date: 07/27/2001  Smokeless status: Never Used                      Alcohol use: Yes           10.0 oz/week     Comment: occasional, weekends      ROS: Per HPI. Denies headaches, weakness/falls, chest pain, shortness of breath, abdominal pain/nausea/vomiting, numbness/tingling, fevers or chills.     Medications, allergies, PMH and SH reviewed and updated as necessary in Epic.    O:  PHYSICAL EXAM:  LMP 07/11/2005  GEN: NAD  HEENT: MMM, sub vertigo with moving head to left  SHOULDER: Normal ROM, pain with full flexion, pain to palpation AC and deltoid   SKIN:  WWP  CHEST: Breathing comfortably  NEURO: No focal deficits noted, alert  MSK: Normal gait observed  PSYCH: Appears stated age, appropriate grooming and dress, appropriate affect and mood congruent    A/P:  Stephanie [name] is a 55 year old female presents with:    I have spent 40 minutes in face to face time with this patient/patient proxy of which > 50% was in counseling or coordination of care  regarding the following issues/Dx:      1. Impingement syndrome of right shoulder  Consistent with exam - reviewed PT and NSAIDs sparingly if no improvement recommend inj  - meloxicam (MOBIC) 7.5 MG tablet; Take 1 tablet by mouth daily    2. BPPV (benign paroxysmal positional vertigo), left  Reviewed neuro notes and likely etiology     3. Schistosomiasis  Reviewed abs and testing and rx - no fu testing now - too early for retest    4. Adenomatous polyp of colon, unspecified part of colon  Reviewed GI records and discussed etiology and 5 year fu - pathology confirmed    5. Tubular adenoma of colon  As above    6. Hypertension, goal below 140/90  Reviewed medications and BMP check today  - Basic Metabolic Panel    7. Thyroid disorder  Pt reports hx - but never on levo per pt - was on thyroid armour? Discussed this is not a medication I prescribe and recommend thyroid testing - pt in agreement  - Thyroid Screen TSH Reflex FT4  - ROUTINE VENIPUNCTURE    8. Elevated hemoglobin A1c  Last 2 checks wnl - pt has lost weight since - resolved    9. Overweight (BMI 25.0-29.9)  Was on phentermine x 6 months and lost 23 lbs - moved back here and off medication - weight has gone up 6 lbs. Reviewed cardiac hx and nothing notable. Pt reports tolerating medication well. Discussed at current weight no indication but at 23 lbs heavier would be appropriate and discussed continuing for a few months and monitoring for excessive weight loss and or stabilization. Pt aware needing monthly apts for refills. NO refills to be given without visit.    - phentermine (ADIPEX-P) 37.5 MG tablet; Take 1 tablet by mouth every morning before breakfast  Dispense: 28 tablet; Refill: 0    10. Pure hypercholesterolemia  Due for check   - Cholesterol  - High Density Lipoprotein  - Low Density Lipoprotein, Direct      We discussed the patients current medications. The patient expressed understanding and no barriers to adherence were identified.    Health Maintenance:  Reviewed HM    Stephanie [name] R. Katy Apo, MD

## 2016-02-10 ENCOUNTER — Encounter (HOSPITAL_BASED_OUTPATIENT_CLINIC_OR_DEPARTMENT_OTHER): Payer: Self-pay | Admitting: Family Medicine

## 2016-02-12 ENCOUNTER — Ambulatory Visit (HOSPITAL_BASED_OUTPATIENT_CLINIC_OR_DEPARTMENT_OTHER): Payer: Medicaid Other | Admitting: Rehabilitative and Restorative Service Providers"

## 2016-02-12 DIAGNOSIS — G8929 Other chronic pain: Principal | ICD-10-CM | POA: Insufficient documentation

## 2016-02-12 DIAGNOSIS — M25511 Pain in right shoulder: Principal | ICD-10-CM

## 2016-02-12 HISTORY — DX: Other chronic pain: G89.29

## 2016-02-12 NOTE — Progress Notes (Signed)
PT Evaluation    HPI: Pt is a 55 y/o, R-handed female who presents to PT with R anterior and lateral shoulder and anterior chest/breast pain which began insidiously in 2012. Pt denies pain or paraesthesias radiating down the R arm. Pt reports that she had been recommended to have surgery in the past but never pursued it.     Aggravating factors: movement with R arm, symptoms worse at night  Alleviating factors: medication (taken every other day), laying on the R side    ADLs: dress/bath with difficulty due to pain moving arm forwards and up. Sleep: wakes nightly due to pain, has to lay on R side to be comfortable   IADLs: if R shoulder painful keeps her arm low.  Driving: uses L arm when R arm painful.    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 Difficulty if having to clean lifting the R arm.   Recreation: not currently exercising due to not enjoying it.     MRI 01/12/12: Rotator cuff tears with hypertrophic degenerative changes of acromioclavicular joint. There is superior labral degeneration, small nondisplaced degenerative tear. Biceps anchor intact     02/12/16 1633   Language Information   Language of [place] Yes   Name [place]   Evaluation Type   Evaluation Type Initial Evaluation   Rehab Discipline   Rehab Discipline PT   Visit   Visit number 1   POC Due date 04/08/16   Pain   Pain Score 3   (0/10 pain when not moving; 3/10 with movement)   Precautions   Precautions Yes   Spine/Thoracic precautions   Other (comments) ?breast aug 2012   Other   Other (Comments) HTN, depression   Weight Bearing Status   RUE FWB   LUE FWB   Patient Stated Goals   Patient stated goals decrease R arm pain   Posture   Forward Head Minimal   Rounded Shoulders Minimal   Scapular Winging Minimal   Posture assessment X   LUE Assessment   LUE Assessment X   LUE AROM (degrees)   L Shoulder Flexion  0-170 160 Degrees   L Shoulder Extension  0-60 55  Degrees   L Shoulder ABduction 0-40 160 Degrees   L Shoulder Internal Rotation  0-70 (to T4)   L Shoulder External Rotation  0-90 65 Degrees   LUE Strength   L Shoulder Flexion 4+/5   L Shoulder Extension 4/5   L Shoulder ABduction 4+/5   L Shoulder Internal Rotation 4+/5   L Shoulder External Rotation 4/5   RUE AROM (degrees)   R Shoulder Flexion  0-170 110 Degrees  (pain)   R Shoulder Extension  0-60 45 Degrees  (pain)   R Shoulder ABduction 0-140 100 Degrees  (pain, relief with manual support)   R Shoulder Internal Rotation  0-70 (to T12 with pain)   R Shoulder External Rotation  0-90 60 Degrees  (pain)   RUE PROM (degrees)   R Shoulder Flexion  0-170 150 Degrees   R Shoulder ABduction 0-140 145 Degrees   R Shoulder Internal Rotation  0-70 45 Degrees   R Shoulder External Rotation  0-90 65 Degrees   RUE Strength   R Shoulder Flexion 4-/5  (pain)   R Shoulder Extension 4-/5  (pain)   R Shoulder ABduction 4-/5  (pain)   R Shoulder Internal Rotation 4-/5  (pain)   R  Shoulder External Rotation 4-/5  (pain)   Clinical Special Tests   Special Tests Yes   Right Shoulder results   R Hawkins-Kennedy Negative   R Full can  Negative   R Drop arm Negative   R Apprehension Positive   R Relocation Positive   R Empty Can (unable to tolerate position due to pain)   R Neer Negative   R Crank Positive  (pain, catching)   R Biceps Load Positive   Palpation   Tenderness to Palpation TTP R distal pec   UE Joint Mobility (0-6 scale)   UE Joint Mobility assessment Yes   Right Shoulder Glenohumeral   R Glenohumeral Anterior 3/6   R Glenohumeral Posterior 3/6   R Glenohumeral inferior 3/6   R Glenohumeral distraction 3/6   Left Shoulder Glenohumeral   L Glenohumeral Anterior 3/6   L Glenohumeral Posterior 3/6   L Glenohumeral inferior 3/6   L Glenohumeral distraction 3/6   Patient Education   What was taught? PT POC; use of ice after getting home from work for 10-15 mins for pain control   Method Verbal   Patient comprehension Yes      Physical Therapy Plan of Care    [id number] R. Katy Apo, MD  Referring Provider: Lynnae January, PA-C;   Diagnosis: Chronic right shoulder pain  (primary encounter diagnosis)    Assessment/Objective Findings:   Patient is a 55 year old R-handed female who presents to PT with anterior/lateral R shoulder pain which pt reports began insidiously in 2012. MRI 01/12/12 indicates R supraspinatus RTC tears as well as superior labral degenerative tear. Pt with limited R AROM with pain, alleviated with manual support. Minor limitations in R shoulder PROM without pain. Full R [place] joint mobility noted without pain. Positive R shoulder crank, biceps load, full and empty can signs with pain, negative drop arm. Pt with limited strength in the R shoulder with pain.  Pt reports significant difficulty and pain with ADLs (reaching above shoulder height) as well as IADLs in which she tries to keep her R arm at waist height. 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 Pt will benefit from skilled PT to address impairments and educate pt on appropriate HEP and self-management skills to optimize pt's ability to perform daily and occupational tasks without pain and limitation. The prescribed treatment plan of care is medically necessary.    Co-morbidities of depression, HTN, possible h/o breast augmentation 2012 were identified and taken into considerations of plan of care.    Short Term Functional Goals: 4 weeks.   1. Pt will be compliant with HEP.   2. Pt will have increased R shoulder PROM: R=L  3. Pt will have increased R shoulder AROM: Flex 120, Abd 115, IR to T10  4. Pt will be able to perform all exercises in clinic and HEP with <4/10 pain.     Long Term Goal: 6 weeks.   1. Pt will be independent with HEP.   2. Pt will have increased R shoulder AROM: Flex 140, Abd 135, IR to T7  3. Pt will have increased R shoulder strength by 1/2 MMT grade.   4. Pt will be able to don  shirt overhead without modification or compensation.   5. Pt will demonstrate independent body mechanics with typical cleaning tasks.     Treatment Plan:  ** PT Eval - Low Complexity (CPT B485921)  ** Stretching/ROM Exercise (  D000499)  ** Therapeutic Exercise (CPT [phone])  ** Home Exercise Program (CPT [phone])  ** Neuromuscular Re-education (CPT [phone])  ** Joint Mobilization (CPT 97140)  ** Soft Tissue Mobilization (CPT 97140)  ** Hot/Cold Rx (CPT 97010)  ** Functional Activities (CPT 97530)  ** Patient Education (CPT [phone])    Recommend Physical Therapy be continued 2 times per week for 6 weeks.  The rehabilitation potential for this patient is fair    Patient Regis Bill is aware of attendance policy: Yes  Plan of care discussed with Patient/Family: Yes  Patient goals reviewed and incorporated in plan of care: Yes  Patient/Family agrees with plan of care: Yes  Patient/Family education: Yes  Does patient feel safe at home: Yes    Egbert Garibaldi PT, DPT

## 2016-02-12 NOTE — Progress Notes (Signed)
PT Treatment Flowsheet     02/12/16 1633   Language Information   Language of Care [place] Yes   Name [place]   Precautions   Precautions Yes   Spine/Thoracic precautions   Other (comments) ?breast aug 2012   Other   Other (Comments) HTN, depression   Weight Bearing Status   RUE FWB   LUE FWB   Rehab Discipline   Rehab Discipline PT   Visit   Visit number 1   POC Due date 04/08/16   Time Calculation   Start Time 1615   Stop Time 1700   Time Calculation (min) 45 min   Pain   Pain Score 3   (0/10 pain when not moving; 3/10 with movement)   Patient Education   What was taught? PT POC; use of ice after getting home from work for 10-15 mins for pain control   Method Verbal   Patient comprehension Yes

## 2016-02-13 NOTE — Progress Notes (Signed)
I certify that the documented Treatment Plan is reasonable and necessary.    02/13/2016  Bengie Kaucher R. Katy Apo, MD

## 2016-02-23 ENCOUNTER — Ambulatory Visit (HOSPITAL_BASED_OUTPATIENT_CLINIC_OR_DEPARTMENT_OTHER): Payer: Medicaid Other | Admitting: Rehabilitative and Restorative Service Providers"

## 2016-02-23 DIAGNOSIS — G8929 Other chronic pain: Principal | ICD-10-CM

## 2016-02-23 DIAGNOSIS — M25511 Pain in right shoulder: Principal | ICD-10-CM

## 2016-02-23 NOTE — Progress Notes (Signed)
S: "My shoulder is having some burning pain." 4/10 pain today  O: Refer to Rehabilitation Treatment Flowsheet   02/23/16 [place] Yes   Name Stephanie [name]   Precautions   Precautions Yes   Spine/Thoracic precautions   Other (comments) ?breast aug 2012   Other   Other (Comments) HTN, depression   Weight Bearing Status   RUE FWB   LUE FWB   Rehab Discipline   Rehab Discipline PT   Visit   Visit number 2   POC Due date 04/08/16   Time Calculation   Start Time 1700   Stop Time 1730   Time Calculation (min) 30 min   Pain   Pain Score 4    Manual Therapy   Manual Therapy Yes   Technique R shoulder PROM   Manual Therapy 2   Technique R GH joint mobes Gr II: post, inf, distr   Manual Therapy 3   Technique STM R anterior shoulder   Ther Exercise   Therapeutic Exercise? Yes   Exercise AAROM R shoulder flex overdoor pulley   Ther Exercise 2   Exercise AAROM R shoulder abduct overdoor pulley   Patient Education   What was taught? HEP as above   Method Verbal;Demo;Practice;Written   Patient comprehension Yes     A: Pt with improved R shoulder PROM with decreased pain at end range. Pt with continued tenderness over the R supraspinatus. Pt issued overdoor pulley and print out of HEP which has been scanned into Epic under [employer].   P: continue per POC

## 2016-02-27 ENCOUNTER — Ambulatory Visit (HOSPITAL_BASED_OUTPATIENT_CLINIC_OR_DEPARTMENT_OTHER): Payer: Medicaid Other | Admitting: Physical Therapy

## 2016-02-27 DIAGNOSIS — M25511 Pain in right shoulder: Principal | ICD-10-CM

## 2016-02-27 DIAGNOSIS — G8929 Other chronic pain: Secondary | ICD-10-CM

## 2016-02-27 NOTE — Progress Notes (Signed)
02/27/16 1515   Language Information   Language of Care [place]   Interpreter No   Precautions   Precautions Yes   Spine/Thoracic precautions   Other (comments) ?breast aug 2012   Other   Other (Comments) HTN, depression   Weight Bearing Status   RUE FWB   LUE FWB   Rehab Discipline   Rehab Discipline PT   Visit   Visit number 3   POC Due date 04/08/16   Time Calculation   Start Time 1500   Stop Time 1530   Time Calculation (min) 30 min   Pain   Pain Score 0    Manual Therapy 2   Technique R GH joint mobes Gr II: post, inf, distr   Time in minutes 5 min    Manual Therapy 3   Technique STM R anterior shoulder/pectoralis x 5 min    Ther Exercise   Exercise S/L R shld ER 2x10   Ther Exercise 2   Exercise S/L R shld abd 2x10   Ther Exercise 3   Exercise 3 FT CGPD with 35# 3x10   Ther Exercise 4   Exercise 4 FT rows 35# 3x10   Ther Exercise 5   Exercise 5 R shld ext with RTB 2x10  (pt c/o increase of R ant shld pain and pectoralis )   Ther Exercise 6   Exercise 6 prone scapular retraction 2x10   Ther Exercise 7   Exercise 7 Ice x 10 min    Patient Education   What was taught? cont with HEP   Method Verbal   Patient comprehension Yes

## 2016-02-27 NOTE — Progress Notes (Signed)
.  S: Pt report no pain prior session.   O: Refer to Rehabilitation Treatment Flowsheet  A: Progressed pt with scapular and RTC strengthening ex's  which pt tolerated well without c/o of pain except  shld ext with RTB. Apply at end of the session STM to ant shld and pectoralis muscle to decrease pain caused by exercise with some good results.   P: Cont with PT per POC.

## 2016-03-01 ENCOUNTER — Ambulatory Visit (HOSPITAL_BASED_OUTPATIENT_CLINIC_OR_DEPARTMENT_OTHER): Payer: Medicaid Other | Admitting: Rehabilitative and Restorative Service Providers"

## 2016-03-04 ENCOUNTER — Emergency Department (HOSPITAL_BASED_OUTPATIENT_CLINIC_OR_DEPARTMENT_OTHER)
Admission: RE | Admit: 2016-03-04 | Disposition: A | Discharge status: Home / Self Care | Payer: Self-pay | Source: Emergency Department | Attending: Emergency Medicine | Admitting: Emergency Medicine

## 2016-03-04 LAB — CBC, PLATELET & DIFFERENTIAL
ABSOLUTE BASO COUNT: 0 10*3/uL (ref 0.0–0.1)
ABSOLUTE EOSINOPHIL COUNT: 0.1 10*3/uL (ref 0.0–0.8)
ABSOLUTE IMM GRAN COUNT: 0.01 10*3/uL (ref 0.00–0.03)
ABSOLUTE LYMPH COUNT: 3.7 10*3/uL (ref 0.6–5.9)
ABSOLUTE MONO COUNT: 0.6 10*3/uL (ref 0.2–1.4)
ABSOLUTE NEUTROPHIL COUNT: 5.8 10*3/uL (ref 1.6–8.3)
BASOPHIL %: 0.4 % (ref 0.0–1.2)
EOSINOPHIL %: 1.4 % (ref 0.0–7.0)
HEMATOCRIT: 37.9 % (ref 34.1–44.9)
HEMOGLOBIN: 13.1 g/dL (ref 11.2–15.7)
IMMATURE GRANULOCYTE %: 0.1 % (ref 0.0–0.4)
LYMPHOCYTE %: 35.7 % (ref 15.0–54.0)
MEAN CORP HGB CONC: 34.6 g/dL (ref 31.0–37.0)
MEAN CORPUSCULAR HGB: 31.8 pg (ref 26.0–34.0)
MEAN CORPUSCULAR VOL: 92 fL (ref 80.0–100.0)
MEAN PLATELET VOLUME: 11.4 fL (ref 8.7–12.5)
MONOCYTE %: 5.8 % (ref 4.0–13.0)
NEUTROPHIL %: 56.6 % (ref 40.0–75.0)
PLATELET COUNT: 257 10*3/uL (ref 150–400)
RBC DISTRIBUTION WIDTH STD DEV: 42.4 fL (ref 35.1–46.3)
RBC DISTRIBUTION WIDTH: 13.2 % (ref 11.5–14.3)
RED BLOOD CELL COUNT: 4.12 M/uL (ref 3.90–5.20)
WHITE BLOOD CELL COUNT: 10.2 10*3/uL (ref 4.0–11.0)

## 2016-03-04 LAB — COMPREHENSIVE METABOLIC PANEL
ALANINE AMINOTRANSFERASE: 33 U/L (ref 12–45)
ALBUMIN: 3.8 g/dL (ref 3.4–5.0)
ALKALINE PHOSPHATASE: 73 U/L (ref 45–117)
ANION GAP: 11 mmol/L (ref 5–15)
ASPARTATE AMINOTRANSFERASE: 22 U/L (ref 8–34)
BILIRUBIN TOTAL: 0.3 mg/dL (ref 0.2–1.0)
BUN (UREA NITROGEN): 16 mg/dL (ref 7–18)
CALCIUM: 9.5 mg/dL (ref 8.5–10.1)
CARBON DIOXIDE: 28 mmol/L (ref 21–32)
CHLORIDE: 105 mmol/L (ref 98–107)
CREATININE: 0.6 mg/dL (ref 0.4–1.2)
ESTIMATED GLOMERULAR FILT RATE: >60 mL/min (ref >60)
Glucose Random: 93 mg/dL (ref 74–160)
POTASSIUM: 3.3 mmol/L — ABNORMAL LOW (ref 3.5–5.1)
SODIUM: 143 mmol/L (ref 136–145)
TOTAL PROTEIN: 7.4 g/dL (ref 6.4–8.2)

## 2016-03-04 LAB — TROPONIN I: TROPONIN I: <0.02 ng/mL (ref 0.00–0.04)

## 2016-03-04 LAB — XR CHEST 2 VIEWS

## 2016-03-04 LAB — D-DIMER PE/DVT, QUANTITATIVE: D-DIMER PE/DVT, QUANTITATIVE: 332 ng/mLFEU (ref 0.00–499)

## 2016-03-04 MED ORDER — ACETAMINOPHEN 325 MG PO TABS
650.00 mg | ORAL_TABLET | Freq: Once | ORAL | Status: AC
Start: 2016-03-04 — End: 2016-03-04
  Administered 2016-03-04: 650 mg via ORAL
  Filled 2016-03-04: qty 2

## 2016-03-04 MED ORDER — IBUPROFEN 600 MG PO TABS
600.0000 mg | ORAL_TABLET | Freq: Three times a day (TID) | ORAL | 0 refills | Status: AC | PRN
Start: 2016-03-04 — End: 2016-03-11

## 2016-03-04 MED ORDER — KETOROLAC TROMETHAMINE 30 MG/ML INJ
15.00 mg | Freq: Once | Status: AC
Start: 2016-03-04 — End: 2016-03-04
  Administered 2016-03-04: 15 mg via INTRAVENOUS
  Filled 2016-03-04: qty 1

## 2016-03-04 NOTE — ED Provider Notes (Signed)
eMERGENCY dEPARTMENT Resident NOTE    The ED nursing record was reviewed.   The prior medical records as available electronically through Epic were reviewed.  This patient was seen with Emergency Department attending physician Dr. Ronny Flurry.    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 She says the pain started as a pinching and has become diffuse throughout the left side. It does not radiate or move. It is exacerbated by breathing and bending forward. Nothing makes it better. She denies shortness of breath and does not recall any trauma to the area.    She also denies nausea, vomiting, diarrhea, chills, and urinary symptoms.    Denies history of blood clots.    REVIEW OF SYSTEMS    The pertinent positives are reviewed in the HPI above. All other systems were reviewed and are negative.    PAST MEDICAL HISTORY    Past Medical History:  No date: Depression  11/21/2012: Hyperopia with astigmatism and presbyopia  11/21/2012: Immature cataract    PROBLEM LIST  Patient Active Problem List:     Urinary calculus, unspecified     Overweight (BMI 25.0-29.9)     Pure hypercholesterolemia     Family history of malignant neoplasm of gastrointestinal tract     Leiomyoma of uterus, unspecified     Major depressive disorder     Irritable bladder     Elevated hemoglobin A1c     Other plastic surgery for unacceptable cosmetic appearance     Impingement syndrome of right shoulder     Tubular adenoma of colon     Diverticulosis of colon     Hyperopia with astigmatism and presbyopia     Immature cataract     Hypertension, goal below 140/90     Mass of hand     Thyroid disorder     Schistosomiasis     Adenomatous polyp of colon     Chronic right shoulder  pain      SURGICAL HISTORY    Past Surgical History:  No date: BREAST ENHANCEMENT SURGERY      Comment: 12/12 had bilateral mammoplasty and saline                implants, had 2nd surgery 5/30 for revision  No date: OB ANTEPARTUM CARE CESAREAN DLVR & POSTPARTUM      Comment: x3  No date: TOTAL ABDOMINAL HYSTERECT W/WO RMVL TUBE OVARY      Comment: for fibroids, still has cervix    CURRENT MEDICATIONS    No current facility-administered medications for this encounter.     Current Outpatient Prescriptions:     phentermine (ADIPEX-P) 37.5 MG tablet, Take 1 tablet by mouth every morning before breakfast, Disp: 28 tablet, Rfl: 0    meloxicam (MOBIC) 7.5 MG tablet, Take 1 tablet by mouth daily, Disp: , Rfl:     thyroid (ARMOUR THYROID) 60 MG tablet, Take 1 tablet by mouth daily, Disp: 30 tablet, Rfl: 11    cyclobenzaprine (FLEXERIL) 5 MG tablet, Take 2 tablets by mouth 3 (three) times daily as needed, Disp: 20 tablet, Rfl: 0    hydrochlorothiazide (HYDRODIURIL) 25 MG tablet, Take 1 tablet by mouth daily,  Disp: 30 tablet, Rfl: 11    mirtazapine (REMERON) 15 MG tablet, Take 1 tablet by mouth nightly, Disp: 30 tablet, Rfl: 0    OMEGA 3 1000 MG OR CAPS, 1 taily, Disp: , Rfl:     ALLERGIES    Review of Patient's Allergies indicates:  No Known Allergies    FAMILY HISTORY      Family History    Cancer - Other Father     Comment: stomach    Heart Brother     Comment: died at 73 during valve replacement    Heart Brother     Comment: chestpain with neg w/u    Heart Mother     Comment: Died of MI age 87       SOCIAL HISTORY    Social History    Marital status: Divorced            Spouse name:                       Years of education:                 Number of children:               Occupational History  Occupation          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; lives                                           w/roommates    Social History Main Topics    Smoking status:  Former Smoker                                                                Packs/day: 0.00      Years: 0.00           Types: Cigarettes       Quit date: 07/27/2001    Smokeless status: Never Used                        Alcohol use: Yes           10.0 oz/week       Comment: occasional, weekends    Drug use: No              Sexual activity: Not Currently     Partners with: Female       Comment: G3P3, no hx STD, no hx abn Pap          PHYSICAL EXAM      Vital Signs: BP 130/84   Pulse 80   Resp (!) 22   Wt 67.1 kg (148 lb)   LMP 07/11/2005   SpO2 100%   BMI 29.15 kg/m2     Constitutional: Well-developed, well-nourished, non-toxic appearance. Speaking  full sentences.  HEENT: Normocephalic, atraumatic.  Cardio.: RRR, no MRGs. Radial pulses 2+ B/L. No LE edema.  Pulmonary: Normal breath sounds bilaterally. No tachypnea, wheezes, rales or rhonchi. GU: No CVA tenderness.  Musculoskeletal: Left chest diffusely tender to palpation. Pain is also reproduced when she gets up and down.  Neurological: AOx3. CNII-XII grossly intact, no focal deficits noted.   Psychiatric: Appropriate for age and situation.      RESULTS  Results for orders placed or performed during the hospital encounter of 03/04/16 (from the past 24 hour(s))   CBC+Plt with Diff    Collection Time: 03/04/16  4:35 PM   Result Value    WHITE BLOOD CELL COUNT 10.2    RED BLOOD CELL COUNT 4.12    HEMOGLOBIN 13.1    HEMATOCRIT 37.9    MEAN CORPUSCULAR VOL 92.0    MEAN CORPUSCULAR HGB 31.8    MEAN CORP HGB CONC 34.6    RBC DISTRIBUTION WIDTH STD DEV 42.4    RBC DISTRIBUTION WIDTH 13.2    PLATELET COUNT 257    MEAN PLATELET VOLUME 11.4    NEUTROPHIL % 56.6    IMMATURE GRANULOCYTE % 0.1    LYMPHOCYTE % 35.7    MONOCYTE % 5.8    EOSINOPHIL % 1.4    BASOPHIL % 0.4    ABSOLUTE NEUTROPHIL COUNT 5.8    ABSOLUTE IMM GRAN COUNT 0.01    ABSOLUTE LYMPH COUNT 3.7    ABSOLUTE MONO COUNT 0.6    ABSOLUTE EOSINOPHIL COUNT 0.1    ABSOLUTE BASO COUNT 0.0    Narrative    Well's Risk  Category:->Low (0-4)  Is patient PERC positive?->Yes  Is this D-Dimer needed to rule out Pulmonary Embolism or  DVT?->Yes  Current Anticoagulant->None   Comprehensive Metabolic Panel    Collection Time: 03/04/16  4:35 PM   Result Value    SODIUM 143    POTASSIUM 3.3 (L)    CHLORIDE 105    CARBON DIOXIDE 28    ANION GAP 11    CALCIUM 9.5    Glucose Random 93    BUN (UREA NITROGEN) 16    TOTAL PROTEIN 7.4    ALBUMIN 3.8    BILIRUBIN TOTAL 0.3    ALKALINE PHOSPHATASE 73    ASPARTATE AMINOTRANSFERASE 22    CREATININE 0.6    ESTIMATED GLOMERULAR FILT RATE > 60    ALANINE AMINOTRANSFERASE 33   D-Dimer    Collection Time: 03/04/16  4:35 PM   Result Value    D-DIMER PE/DVT, QUANTITATIVE 332    Narrative    Well's Risk Category:->Low (0-4)  Is patient PERC positive?->Yes  Is this D-Dimer needed to rule out Pulmonary Embolism or  DVT?->Yes  Current Anticoagulant->None   Troponin I    Collection Time: 03/04/16  4:35 PM   Result Value    TROPONIN I < 0.02        RADIOLOGY  CXR:  FINDINGS:    The heart and mediastinal silhouette are normal. The pulmonary    vascularity is normal. The lungs are clear. There is no    pneumothorax. There are no pleural effusions. There are mild    degenerative changes in the thoracic spine..      IMPRESSION:    No acute pulmonary process.     EKG:  Normal sinus rhythm at 86 bpm. Nonspecific ST changes in V3 and V4. EKG unchanged from prior (01/2013).     MEDICATIONS ADMINISTERED ON THIS VISIT  Medication Orders Placed This Encounter      ketorolac (TORADOL) injection 15 mg      acetaminophen (TYLENOL) tablet 650 mg    ED COURSE & MEDICAL DECISION MAKING      I reviewed the patient's past medical history/problem list, past surgical history, medication list, social history and allergies.    ED Decision Making & Course: Pt is a 55 year old female with HLD, HTN, elevated A1c presenting with 6 days of chest pain that worsened acutely today.       Pain is likely musculoskeletal in nature given  history of exacerbation with movement, reproducibility on exam.    Symptomatology, exam, normal troponins and EKG not suggestive of cardiac etiology.  Normal CXR rules out dissection, acute fracture.  Patient is PERC positive; normal d-dimer rules out PE.  CMP notable for hypokalemia, otherwise normal.   LFTs, CBC unremarkable.    Patient refused second troponin as she needed to get to work. Understood risk. Knows that she should return if she experiences worsening chest pain, nausea/vomiting/flushing, or shortness of breath.    Pt remained hemodynamically stable during their stay in the emergency department.    Follow Up: Patient may continue acetaminophen and ibuprofen for pain control. Should follow up with primary care for further workup. Advised to return to the ED at any time if worse, for new or different symptoms, or if unable to arrange follow-up as advised.

## 2016-03-04 NOTE — Discharge Instructions (Signed)
You were seen in the emergency department at [facility] on July 6th, 2017, for pain in your chest. While you were here, you had a normal chest x-ray, and an EKG showed a normal heart rhythm that was unchanged from your last EKG.    We also gave you a medicine called toradol (ketorolac), which helped with your pain. At home, you should take advil (ibuprofen) every 8 hours as needed for the next 7 days.    Please follow up with your primary care doctor if your symptoms do not improve. Return to the emergency department if your chest pain worsens, if you develop nausea, vomiting, or other new symptoms, or if you are unable to arrange follow-up as advised.

## 2016-03-04 NOTE — Narrator Note (Signed)
MD spoke with pt about repeat Troponin in 1 hour. Pt declines to stay

## 2016-03-04 NOTE — Narrator Note (Signed)
Patient Disposition    Patient education for diagnosis, medications, activity, diet and follow-up.  Patient left ED 7:12 PM.  Patient rep received written instructions.  Interpreter to provide instructions: No/ pt declined    Patient belongings with patient: YES    Have all existing LDAs been addressed? N/A    Have all IV infusions been stopped? N/A    Discharged to: Discharged to home. Pt alert and oriented. Pt has a steady gait

## 2016-03-04 NOTE — ED Triage Note (Signed)
Chest pain x 1 week.  Pain worse with deep inhalation, getting progressively worse.  Denies SOB or diaphoresis

## 2016-03-04 NOTE — Narrator Note (Signed)
Pt requesting to be discharged. Dr. Yang notified.

## 2016-03-05 ENCOUNTER — Ambulatory Visit (HOSPITAL_BASED_OUTPATIENT_CLINIC_OR_DEPARTMENT_OTHER): Payer: Medicaid Other | Admitting: Rehabilitative and Restorative Service Providers"

## 2016-03-05 ENCOUNTER — Ambulatory Visit (HOSPITAL_BASED_OUTPATIENT_CLINIC_OR_DEPARTMENT_OTHER): Payer: Medicaid Other | Admitting: Family Medicine

## 2016-03-05 LAB — EKG

## 2016-03-08 ENCOUNTER — Ambulatory Visit (HOSPITAL_BASED_OUTPATIENT_CLINIC_OR_DEPARTMENT_OTHER): Payer: Medicaid Other | Admitting: Rehabilitative and Restorative Service Providers"

## 2016-03-08 DIAGNOSIS — G8929 Other chronic pain: Principal | ICD-10-CM

## 2016-03-08 DIAGNOSIS — M25511 Pain in right shoulder: Principal | ICD-10-CM

## 2016-03-08 NOTE — Progress Notes (Signed)
S: "My shoulder is feeling very good." 0/10 pain today  O: Refer to Rehabilitation Treatment Flowsheet   03/08/16 1633   Language Information   Language of Care Portuguese   Interpreter No   Other   Other (Comments) HTN, depression   Rehab Discipline   Rehab Discipline PT   Visit   Visit number 4   POC Due date 04/08/16   Time Calculation   Start Time 1630   Stop Time 1700   Time Calculation (min) 30 min   Pain   Pain Score 0    Ther Exercise   Exercise MET R shoulder flex 2#   Reps 30   Sets 2   Ther Exercise 2   Exercise MET R shoulder abduct 2#   Reps 2 30   Sets 2 2   Ther Exercise 3   Exercise 3 prone I's and T's    Reps 3 10   Sets 3 2  (each direction)   Ther Exercise 4   Exercise 4 mini wall ball push ups    Reps 4 10   Sets 4 2   Ther Exercise 5   Exercise 5 UBE x 6'  (fwd/back)   Ther Exercise 6   Exercise 6 doorway pec stretch   Reps 6 3   Holds 6 30 sec   Patient Education   What was taught? continue HEP as tolerable   Method Verbal   Patient comprehension Yes     A: Pt able to tolerate improved shoulder AAROM with pulleys with min VC/TC to avoid shoulder hiking. Pt with min initial VCs for proper scapula depression/retraction with prone I's and T's without shoulder hiking.   P: continue per POC

## 2016-03-12 ENCOUNTER — Ambulatory Visit (HOSPITAL_BASED_OUTPATIENT_CLINIC_OR_DEPARTMENT_OTHER): Payer: Medicaid Other | Admitting: Rehabilitative and Restorative Service Providers"

## 2016-03-15 ENCOUNTER — Ambulatory Visit (HOSPITAL_BASED_OUTPATIENT_CLINIC_OR_DEPARTMENT_OTHER): Payer: Medicaid Other | Admitting: Rehabilitative and Restorative Service Providers"

## 2016-03-15 DIAGNOSIS — M25511 Pain in right shoulder: Principal | ICD-10-CM

## 2016-03-15 DIAGNOSIS — G8929 Other chronic pain: Principal | ICD-10-CM

## 2016-03-15 NOTE — Progress Notes (Signed)
S: "My shoulder is feeling good. I am working and it is fine." 0/10 pain today  O: Refer to Rehabilitation Treatment Flowsheet   03/15/16 1657   Language Information   Language of Care Portuguese   Interpreter No   Other   Other (Comments) HTN, depression   Rehab Discipline   Rehab Discipline PT   Visit   Visit number 5   POC Due date 04/08/16   Time Calculation   Start Time 1635   Stop Time 1700   Time Calculation (min) 25 min   Pain   Pain Score 0    Ther Exercise   Therapeutic Exercise? Yes   Ther Exercise 4   Exercise 4 mini wall ball push ups    Reps 4 10   Sets 4 2   Ther Exercise 5   Exercise 5 UBE x 6'  (1' intervals fwd, back)   Ther Exercise 6   Exercise 6 rows GTB   Reps 6 20   Sets 6 2   Ther Exercise 7   Exercise 7 pull downs GTB   Reps 7 20   Sets 7 2   Ther Exercise 8   Exercise 8 IR RTB   Reps 8 15   Sets 8 2   Ther Exercise 9   Exercise 9 ER RTB    Reps 9 15   Sets 9 2   Ther Exercise 10   Exercise 10 qped thread the needle stretch   Reps 10 10  (each side)   Ther Exercise 11   Exercise 11 supine horiz abd RTB   Reps 11 20   Sets 11 2   Patient Education   What was taught? continue HEP as tolerable   Method Verbal   Patient comprehension Yes     A: Pt with provocation of pain with resisted R shoulder ER. Pt unable to tolerate kneeling thread the needle stretch with weight bearing on R UE. Pt required mod VCs to avoid pushing into pain with exercise.   P: update HEP and d/c next visit

## 2016-03-19 ENCOUNTER — Ambulatory Visit (HOSPITAL_BASED_OUTPATIENT_CLINIC_OR_DEPARTMENT_OTHER): Payer: Medicaid Other | Admitting: Rehabilitative and Restorative Service Providers"

## 2016-03-22 ENCOUNTER — Ambulatory Visit (HOSPITAL_BASED_OUTPATIENT_CLINIC_OR_DEPARTMENT_OTHER): Payer: Medicaid Other | Admitting: Rehabilitative and Restorative Service Providers"

## 2016-04-06 ENCOUNTER — Ambulatory Visit (HOSPITAL_BASED_OUTPATIENT_CLINIC_OR_DEPARTMENT_OTHER): Payer: Self-pay | Admitting: Gastroenterology

## 2016-05-12 ENCOUNTER — Other Ambulatory Visit (HOSPITAL_BASED_OUTPATIENT_CLINIC_OR_DEPARTMENT_OTHER): Payer: Self-pay

## 2016-05-12 NOTE — Telephone Encounter (Signed)
Patient update on Mammogram

## 2016-07-19 ENCOUNTER — Encounter (HOSPITAL_BASED_OUTPATIENT_CLINIC_OR_DEPARTMENT_OTHER): Payer: Self-pay | Admitting: Family Medicine

## 2016-07-19 ENCOUNTER — Ambulatory Visit (HOSPITAL_BASED_OUTPATIENT_CLINIC_OR_DEPARTMENT_OTHER): Payer: PRIVATE HEALTH INSURANCE | Admitting: Family Medicine

## 2016-07-19 VITALS — BP 114/81 | HR 83 | Temp 98.2°F | Wt 155.0 lb

## 2016-07-19 DIAGNOSIS — E663 Overweight: Secondary | ICD-10-CM

## 2016-07-19 DIAGNOSIS — IMO0002 Reserved for concepts with insufficient information to code with codable children: Secondary | ICD-10-CM

## 2016-07-19 DIAGNOSIS — F3340 Major depressive disorder, recurrent, in remission, unspecified: Secondary | ICD-10-CM

## 2016-07-19 DIAGNOSIS — K210 Gastro-esophageal reflux disease with esophagitis: Secondary | ICD-10-CM

## 2016-07-19 DIAGNOSIS — Z124 Encounter for screening for malignant neoplasm of cervix: Principal | ICD-10-CM

## 2016-07-19 MED ORDER — OMEPRAZOLE 20 MG PO CPDR: 20 mg | capsule | Freq: Every day | ORAL | 6 refills | 0 days | Status: DC

## 2016-07-19 MED ORDER — OMEPRAZOLE 20 MG PO CPDR
20.0000 mg | DELAYED_RELEASE_CAPSULE | Freq: Every day | ORAL | 6 refills | Status: DC
Start: 2016-07-19 — End: 2016-12-10

## 2016-07-19 NOTE — Progress Notes (Deleted)
SUBJECTIVE:  Stephanie [name] is a 55 year old female with a history of     Patient Active Problem List:     Urinary calculus, unspecified     Overweight (BMI 25.0-29.9)     Pure hypercholesterolemia     Family history of malignant neoplasm of gastrointestinal tract     Leiomyoma of uterus, unspecified     Major depressive disorder     Irritable bladder     Elevated hemoglobin A1c     Other plastic surgery for unacceptable cosmetic appearance     Impingement syndrome of right shoulder     Tubular adenoma of colon     Diverticulosis of colon     Hyperopia with astigmatism and presbyopia     Immature cataract     Hypertension, goal below 140/90     Mass of hand     Thyroid disorder     Schistosomiasis     Adenomatous polyp of colon     Chronic right shoulder pain      who presents *** with    1)    2)     3)     REVIEW OF SYSTEMS:   CONSTITUTIONAL: No weight loss, fever, chills, weakness or fatigue.  HEENT: Eyes: No visual loss, blurred vision, double vision or yellow sclerae. Ears, Nose, Throat: No hearing loss, sneezing, congestion, runny nose or sore throat.  CARDIOVASCULAR: No chest pain, chest pressure or chest discomfort. No palpitations or edema.  RESPIRATORY: No shortness of breath, cough or sputum.  GASTROINTESTINAL: No nausea, vomiting, diarrhea or constipation. No abdominal pain or blood in stool   GENITOURINARY: No burning on urination.No abnormal vaginal bleeding  NEUROLOGICAL: No headache, dizziness, syncope, numbness or tingling in the extremities. No change in bowel or bladder control.  MUSCULOSKELETAL: No muscle, back pain, joint pain or stiffness.  HEMATOLOGIC: No anemia, bleeding or bruising.  PSYCHIATRIC: No history of depression or anxiety.  ENDOCRINOLOGIC: No reports of sweating, cold or heat intolerance. No polyuria or polydipsia.  SKIN: No rash or itching.  ALLERGIES: No history of asthma, hives, eczema or rhinitis.    OBJECTIVE  Past Medical History:  No date: Depression  11/21/2012:  Hyperopia with astigmatism and presbyopia  11/21/2012: Immature cataract  Past Surgical History:  No date: BREAST ENHANCEMENT SURGERY      Comment: 12/12 had bilateral mammoplasty and saline                implants, had 2nd surgery 5/30 for revision  No date: OB ANTEPARTUM CARE CESAREAN DLVR & POSTPARTUM      Comment: x3  No date: TOTAL ABDOMINAL HYSTERECT W/WO RMVL TUBE OVARY      Comment: for fibroids, still has cervix    Family History    Cancer - Other Father     Comment: stomach    Heart Brother     Comment: died at 83 during valve replacement    Heart Brother     Comment: chestpain with neg w/u    Heart Mother     Comment: Died of MI age 21       Social History  Social History   Marital status: Divorced  Spouse name: N/A    Years of education: N/A  Number of children: N/A     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; 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     Social History Main Topics   Smoking status: Former Smoker     Types: Cigarettes  Quit date: 07/27/2001    Smokeless tobacco: Never Used    Alcohol use Yes  10.0 oz/week     Comment: occasional, weekends    Drug use: No    Sexual activity: Not Currently    Partners: Male    Comment: G3P3, no hx STD, no hx abn Pap     Other Topics Concern   None on file     Social History Narrative   None on file       MEDICATIONS:     Current Outpatient Prescriptions on File Prior to Visit:  phentermine (ADIPEX-P) 37.5 MG tablet Take 1 tablet by mouth every morning before breakfast Disp: 28 tablet Rfl: 0   meloxicam (MOBIC) 7.5 MG tablet Take 1 tablet by mouth daily Disp:  Rfl:    thyroid (ARMOUR THYROID) 60 MG tablet Take 1 tablet by mouth daily Disp: 30 tablet Rfl: 11   hydrochlorothiazide (HYDRODIURIL) 25 MG tablet Take 1 tablet by mouth daily Disp: 30 tablet Rfl: 11   mirtazapine (REMERON) 15 MG tablet Take 1 tablet by mouth nightly Disp: 30 tablet Rfl: 0   OMEGA 3 1000 MG OR CAPS 1 taily Disp:  Rfl:      No current facility-administered medications on file prior to visit.       ALLERGIES:  Review of Patient's Allergies indicates:  No Known Allergies    PHYSICAL EXAM  VITALS:  BP 114/81  Pulse 83  Temp 98.2 F (36.8 C) (Temporal)  Wt 70.3 kg (155 lb)  LMP 07/11/2005  SpO2 99%  BMI 30.53 kg/m2  General: Well appearing, no distress. Alert and oriented x 3.  Eyes: PERRLA, EOMI. No scleral icterus.  Mouth: MMM, normal oropharynx  Neck: supple, thyroid normal, no significant cervical lymphadenopathy  Heart: regular rate and rhythm, normal S1 and S2, no murmurs, rubs or gallops.  Lungs:  Clear to auscultation bilaterally, no wheezing or rales  Abdomen: soft, non-tender, non-distended, no masses palpated, no organomegaly, no rebound or guarding  GU: ***  Skin: No rashes or lesions  Neuro: Grossly normal strength and sensation. Normal gait  Extremities: warm, well perfused, no edema or cyanosis.   Psych: Normal affect, normal insight and judgment.     ASSESSMENT/PLAN:   Stephanie [name] is a 55 year old female here for  No diagnosis found.    1. The patient indicates understanding of these issues and agrees with the plan.   No apparent learning barriers were identified. I attempted to answer any questions regarding the diagnosis and the proposed treatment.  2.  The patient is given an After Visit Summary sheet that lists all of their medications with directions, their allergies, orders placed during this encounter, and follow- up instructions.  3. I reviewed the patient's medical information, allergies and medications. I reviewed the potential side effects of any new medications prescribed.     Stanford Scotland, MD, 07/19/2016 1:59 PM

## 2016-07-19 NOTE — Progress Notes (Signed)
55 year old woman here for pelvic part of annual exam. She plans to see her primary care provider for an annual exam     Obstetric History   G3  P3  T3  P0  A0  L0    SAB0  TAB0  Ectopic0  Molar0  Multiple0  Live Births0        CC/HPI:    GYN History:  post menopausal      Current Outpatient Prescriptions:  phentermine (ADIPEX-P) 37.5 MG tablet Take 1 tablet by mouth every morning before breakfast Disp: 28 tablet Rfl: 0   meloxicam (MOBIC) 7.5 MG tablet Take 1 tablet by mouth daily Disp:  Rfl:    thyroid (ARMOUR THYROID) 60 MG tablet Take 1 tablet by mouth daily Disp: 30 tablet Rfl: 11   hydrochlorothiazide (HYDRODIURIL) 25 MG tablet Take 1 tablet by mouth daily Disp: 30 tablet Rfl: 11   mirtazapine (REMERON) 15 MG tablet Take 1 tablet by mouth nightly Disp: 30 tablet Rfl: 0   OMEGA 3 1000 MG OR CAPS 1 taily Disp:  Rfl:      No current facility-administered medications for this visit.     Patient has no known allergies.    Patient Active Problem List:     Urinary calculus, unspecified     Overweight (BMI 25.0-29.9)     Pure hypercholesterolemia     Family history of malignant neoplasm of gastrointestinal tract     Leiomyoma of uterus, unspecified     Major depressive disorder     Irritable bladder     Elevated hemoglobin A1c     Other plastic surgery for unacceptable cosmetic appearance     Impingement syndrome of right shoulder     Tubular adenoma of colon     Diverticulosis of colon     Hyperopia with astigmatism and presbyopia     Immature cataract     Hypertension, goal below 140/90     Mass of hand     Thyroid disorder     Schistosomiasis     Adenomatous polyp of colon     Chronic right shoulder pain      Past Surgical History:  No date: BREAST ENHANCEMENT SURGERY      Comment: 12/12 had bilateral mammoplasty and saline                implants, had 2nd surgery 5/30 for revision  No date: OB ANTEPARTUM CARE CESAREAN DLVR & POSTPARTUM      Comment: x3  No date: TOTAL ABDOMINAL HYSTERECT W/WO RMVL TUBE OVARY       Comment: for fibroids, still has cervix      Social History  Social History   Marital status: Divorced  Spouse name: N/A    Years of education: N/A  Number of children: N/A     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      Family History    Cancer - Other Father     Comment: stomach    Heart Brother     Comment: died at 39 during valve replacement    Heart Brother     Comment: chestpain with neg w/u    Heart Mother     Comment: Died of MI age 21       ROS:  Constitutional: fatigue and weight gain  Allergy: none  Endocrine: negative  Dermatologic: negative  Cardiovascular: negative  Respiratory: negative  Gastrointestinal: negative  Genitourinary: negative  Musculoskeletal: negative and muscle pain  Neurological: negative, headache, weakness, dizziness and lightheadedness  Psychiatric: anxiety and depression    PHYSICAL EXAM:  BP 114/81  Pulse 83  Temp 98.2 F (36.8 C) (Temporal)  Wt 70.3 kg (155 lb)  LMP 07/11/2005  SpO2 99%  BMI 30.53 kg/m2  Gen: Well appearing, no acute distress  HEENT: Normocephalic, atraumatic, EOMI  Resp: no respiratory distress  Neuro: gait intact  Psych: A+O x3, mood and affect appropriate  Pelvic: External Genitalia: normal architecture; Vagina: well rugated and no lesions; Vaginal Discharge: normal appearing; Cervix: no lesions    ASSESSMENT & PLAN:    (Z12.4) Screening for malignant neoplasm of cervix  (primary encounter diagnosis)    Plan: CYTP CERV/VAG AUTO THIN LAYER PREP MNL SCREEN,         OBTAINING SCREEN PAP SMEAR, HUMAN          PAPILLOMAVIRUS          (E66.3) Overweight (BMI 25.0-29.9)  Comment: Patient had been without health insurance. Never picked up phentermine as prescribed by Dr. Katy Apo.  Would like to start medication   Plan: Discuss with Dr. 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

## 2016-07-20 LAB — HUMAN PAPILLOMAVIRUS (HPV): HUMAN PAPILLOMAVIRUS: NEGATIVE

## 2016-07-21 ENCOUNTER — Other Ambulatory Visit (HOSPITAL_BASED_OUTPATIENT_CLINIC_OR_DEPARTMENT_OTHER): Payer: Self-pay

## 2016-07-21 NOTE — Progress Notes (Signed)
Thank you for your referral. We were able to contact your patient and schedule an appointment for an initial evaluation on January 9th with Elna Breslow and January [id number] with Marcela Horvitz-Lennon,MD.

## 2016-07-30 LAB — CYTOPATH, C/V, THIN LAYER

## 2016-08-13 ENCOUNTER — Encounter (HOSPITAL_BASED_OUTPATIENT_CLINIC_OR_DEPARTMENT_OTHER): Payer: Self-pay | Admitting: Ophthalmology

## 2016-08-13 ENCOUNTER — Ambulatory Visit (HOSPITAL_BASED_OUTPATIENT_CLINIC_OR_DEPARTMENT_OTHER): Payer: PRIVATE HEALTH INSURANCE | Admitting: Ophthalmology

## 2016-08-13 DIAGNOSIS — H01024 Squamous blepharitis left upper eyelid: Secondary | ICD-10-CM

## 2016-08-13 DIAGNOSIS — H0102A Squamous blepharitis right eye, upper and lower eyelids: Secondary | ICD-10-CM | POA: Insufficient documentation

## 2016-08-13 DIAGNOSIS — H52203 Unspecified astigmatism, bilateral: Secondary | ICD-10-CM

## 2016-08-13 DIAGNOSIS — H269 Unspecified cataract: Secondary | ICD-10-CM

## 2016-08-13 DIAGNOSIS — H01021 Squamous blepharitis right upper eyelid: Secondary | ICD-10-CM

## 2016-08-13 DIAGNOSIS — H01022 Squamous blepharitis right lower eyelid: Secondary | ICD-10-CM

## 2016-08-13 DIAGNOSIS — H5203 Hypermetropia, bilateral: Secondary | ICD-10-CM

## 2016-08-13 DIAGNOSIS — H0102B Squamous blepharitis left eye, upper and lower eyelids: Principal | ICD-10-CM

## 2016-08-13 DIAGNOSIS — H01025 Squamous blepharitis left lower eyelid: Secondary | ICD-10-CM

## 2016-08-13 DIAGNOSIS — H524 Presbyopia: Secondary | ICD-10-CM

## 2016-08-13 HISTORY — DX: Squamous blepharitis right eye, upper and lower eyelids: H01.02A

## 2016-08-13 MED ORDER — ERYTHROMYCIN 5 MG/GM OP OINT: g | OPHTHALMIC | 12 refills | 0 days | Status: AC

## 2016-08-13 MED ORDER — ERYTHROMYCIN 5 MG/GM OP OINT
TOPICAL_OINTMENT | OPHTHALMIC | 12 refills | Status: AC
Start: 2016-08-13 — End: 2017-08-13

## 2016-08-13 NOTE — Nursing Note (Signed)
Pt with watery sticky OS x 1 mo    Not seeing well with current glasses

## 2016-08-13 NOTE — Patient Instructions (Signed)
Stephanie [name]    You have a common, and mild condition known as blepharitis.  It could be thought of as analogous to a dandruff of the eye lashes.  There is an inflammation and irritation of your eyelid margins in both eyes.  A flaky, but oily skin at the base of the eyelashes causes inflammation.  The normal bacteria on the skin overgrow.  This sheds into your eyes, causing your eyes to be itchy, swollen and red.  You should:  1) Soak your closed eyelids four five minutes at a time with a warm face clothe every day.  2) Clean the eyelid margins with diluted baby shampoo daily.  Use a teaspoon of baby shampoo in a glass of warm water, then dip a face clothe into this, and use to gently clean the lid margins once a day.  3) Apply the Ilotycin ointment which I prescribed for you, every evening, inside the eyelid and on the lid margin, once a day for a month.    Gershon Crane, MD  [phone]

## 2016-08-13 NOTE — Progress Notes (Signed)
The patient has mild  blepharitis in both eyes.   She is instructed to apply warm compresses to her closed eyelids twice a day, for five minutes at a time.  She will clean her eyelid margins gently, with diluted baby shampoo every evening.  She is encouraged to apply Ilotycin ointment inside both eyelids every evening for at least one month.    Hyperopia with astigmatism and presbyopia. She's given  Prescription for glasses.            Marland Kitchen

## 2016-08-17 ENCOUNTER — Ambulatory Visit (HOSPITAL_BASED_OUTPATIENT_CLINIC_OR_DEPARTMENT_OTHER): Payer: PRIVATE HEALTH INSURANCE | Admitting: Family Medicine

## 2016-08-17 ENCOUNTER — Encounter (HOSPITAL_BASED_OUTPATIENT_CLINIC_OR_DEPARTMENT_OTHER): Payer: Self-pay | Admitting: Family Medicine

## 2016-08-17 VITALS — BP 120/81 | HR 81 | Temp 97.6°F | Ht 59.45 in | Wt 157.8 lb

## 2016-08-17 DIAGNOSIS — Z6832 Body mass index (BMI) 32.0-32.9, adult: Principal | ICD-10-CM

## 2016-08-17 DIAGNOSIS — F3340 Major depressive disorder, recurrent, in remission, unspecified: Secondary | ICD-10-CM

## 2016-08-17 DIAGNOSIS — Z23 Encounter for immunization: Secondary | ICD-10-CM

## 2016-08-17 DIAGNOSIS — R0981 Nasal congestion: Secondary | ICD-10-CM

## 2016-08-17 DIAGNOSIS — F5104 Psychophysiologic insomnia: Secondary | ICD-10-CM

## 2016-08-17 DIAGNOSIS — Z8659 Personal history of other mental and behavioral disorders: Secondary | ICD-10-CM

## 2016-08-17 DIAGNOSIS — E6609 Other obesity due to excess calories: Principal | ICD-10-CM

## 2016-08-17 MED ORDER — PHENTERMINE HCL 37.5 MG PO TABS
37.5000 mg | ORAL_TABLET | Freq: Every morning | ORAL | 0 refills | Status: DC
Start: 2016-08-17 — End: 2016-10-01

## 2016-08-17 MED ORDER — PHENTERMINE HCL 37.5 MG PO TABS: 38 mg | tablet | Freq: Every morning | ORAL | 0 refills | 0 days | Status: DC

## 2016-08-17 NOTE — Progress Notes (Signed)
08/17/2016  VIS given prior to administration and reviewed with the patient and or legal guardian. Patient understands the disease and the vaccine. See immunization/Injection module or chart review for date of publication and additional information.  Lisette Abu, LPN

## 2016-08-17 NOTE — Progress Notes (Signed)
BP 120/81  Pulse 81  Temp 97.6 F (36.4 C) (Temporal)  Ht 4' 11.45" (1.51 m)  Wt 71.6 kg (157 lb 13.6 oz)  LMP 07/11/2005  SpO2 96%  BMI 31.4 kg/m2    S:    Stephanie [name] is a 55 year old female who presents with:    Most Recent Weight Reading(s)  08/17/16 : 71.6 kg (157 lb 13.6 oz)  07/19/16 : 70.3 kg (155 lb)  03/04/16 : 67.1 kg (148 lb)  02/06/16 : 68 kg (150 lb)  12/29/15 : 65.3 kg (144 lb)    Estimated body mass index is 31.4 kg/m as calculated from the following:    Height as of this encounter: 4' 11.45" (1.51 m).    Weight as of this encounter: 71.6 kg (157 lb 13.6 oz).      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 Denies headaches, weakness/falls, chest pain, shortness of breath, abdominal pain/nausea/vomiting, numbness/tingling, fevers or chills.     Medications, allergies, PMH and SH reviewed and updated as necessary in Epic.    O:  PHYSICAL EXAM:  BP 120/81  Pulse 81  Temp 97.6 F (36.4 C) (Temporal)  Ht 4' 11.45" (1.51 m)  Wt 71.6 kg (157 lb 13.6 oz)  LMP 07/11/2005  SpO2 96%  BMI 31.4 kg/m2  GEN: NAD  HEENT: MMM  SKIN: WWP  CHEST: Breathing comfortably  NEURO: No focal deficits noted,  alert  MSK: Normal gait observed  PSYCH: Appears stated age, appropriate grooming and dress, appropriate affect and mood congruent    A/P:  Stephanie [name] is a 55 year old female presents with:    1. Nasal congestion  Reviewed viral     2. Recurrent major depressive disorder, in remission ([place])  Reviewed hx and med use and current stressors    3. Psychophysiological insomnia  Reviewed management of insomnia -     4. History of recurrent  psychosocial stressors    5. Class 1 obesity due to excess calories without serious comorbidity with body mass index (BMI) of 32.0 to 32.9 in adult  Reviewed that phentermine is a long term med and likely will need to use continuously - reviewed management includes apt every 3 mo since has been previously on meds and stable. Reviewed cardiac risks and general risk information    6. Need for prophylactic vaccination and inoculation against influenza  Due  - IMMUNIZATION ADMIN SINGLE, RN  - PR IIV4 VACC PRESRV FREE 0.5 ML DOS FOR IM USE      We discussed the patients current medications. The patient expressed understanding and no barriers to adherence were identified.    I have spent 25 minutes in face to face time with this patient/patient proxy of which > 50% was in counseling or coordination of care regarding above issues/Dx including documentation of previous history, reviewing of chart, discussing symptoms and previous visits and treatment plans with pt, discussing current symptoms.    F/u 3 mo and as needed    Health Maintenance:  Reviewed HM    Abdur Hoglund R. Katy Apo, MD

## 2016-08-25 ENCOUNTER — Telehealth (HOSPITAL_BASED_OUTPATIENT_CLINIC_OR_DEPARTMENT_OTHER): Payer: Self-pay

## 2016-08-25 NOTE — Progress Notes (Signed)
Stephanie [name], 08/25/2016, 4:45 PM  Writer called pt to outreach for depression, per Dr.oxnard, no answer, left voicemail.

## 2016-09-03 ENCOUNTER — Ambulatory Visit (HOSPITAL_BASED_OUTPATIENT_CLINIC_OR_DEPARTMENT_OTHER): Payer: PRIVATE HEALTH INSURANCE | Admitting: Psychopharm (PRV Practice 11)

## 2016-09-06 ENCOUNTER — Telehealth (HOSPITAL_BASED_OUTPATIENT_CLINIC_OR_DEPARTMENT_OTHER): Payer: Self-pay

## 2016-09-06 NOTE — Progress Notes (Signed)
Stephanie [name], 09/06/2016, 10:19 AM  Writer called pt to outreach for depression and do a PHQ-9, no answer, left voicemail.

## 2016-09-15 ENCOUNTER — Ambulatory Visit (HOSPITAL_BASED_OUTPATIENT_CLINIC_OR_DEPARTMENT_OTHER): Payer: PRIVATE HEALTH INSURANCE

## 2016-09-15 DIAGNOSIS — F321 Major depressive disorder, single episode, moderate: Principal | ICD-10-CM

## 2016-09-15 NOTE — Progress Notes (Signed)
ADULT PSYCHIATRY INITIAL EVALUATION    CHIEF COMPLAINT: "I need therapy."  "I would like to return to [place] but I can't at this point."  Reports poor sleep, cries a lot. Overeating, has gained over 20#  Referred by PCP  PHQ-9 on 07/19/16 was 16. Not suicidal  Isolating  Not currently on psych meds but scheduled to see provider in a few weeks    HISTORY of PRESENT ILLNESS: feeling depressed for last 4 months - no idea why    CURRENT MEDICATIONS: None.    Past Medications: None.    CURRENT TREATMENT: None.    System Involvement:   Undocumented  Came via [place] so getting green card would be v difficult    PAST PSYCHIATRIC HISTORY:   Was referred in the past but never met w anyone.    SUBSTANCE USE:   This is a concern to her  Mostly drinks on weekends  Up to 8 beers at a time  Would like to stop again  Has stopped in the past for up to 4 months  Has stopped drinking many times  Drinking started when marriage ended 20 years ago  Drinking more the last 5 years - not sure why  Notes that she is more depressed when she is not drinking - when she drinks, she goes out more and feels more social    Family Constellation:   Has 3 kids - 2 in [place] and 1 in [place]  Parents had 21 kids together.  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 He returned to [place], they tried to work it out but he left her after 2 years for another woman. He left her w 3  kids and did not help financiall  Came to [place] via [place] in 1999 to try life here. Sis was living here at the time  Owns a house in [place] and pays into [place] pension - she intends to retire there one day    Trauma History: Further assessment indicated.    MEDICAL HISTORY:   Had complete hysterectomy in 2011 for fibroids  Goes to see doc in [place] for diet - says he does not give any medication, herbs or compounds but just advise to eat healthy, exercise    MENTAL STATUS EXAM:  Appearance: glasses. Overweight. Cried at times when talking about extreme childhood hunger and poverty  Behavior: cooperative, pleasant  Alertness:  alert  Speech:  Clear, fluid  Mood: depressed  Affect:  Full range  Thought Process: linear  Thought Content:  unremarkable  Perceptions:  No hallucinations  Judgment/Impulse Control: no h/o self-harm    Insight:  good  Cognition: grossly intact  Suicidal/Homicidal: denies    BIO/PSYCHO/SOCIAL AND RISK FORMULATION(S):  56 year old divorced [place]  woman, referred by PCP for therapy. She recognizes that she is depressed and drinking too much. She is willing to consult w PMH psychopharm. Interested in regular therapy.  Ethnic and cultural factors: from [place]. Grew up in extreme poverty  Religious and spiritual beliefs, values and preferences: regularly attends church    DSM 5 DIAGNOSES:  Primary Psychiatric Diagnosis: MDD, single, moderate  Secondary Psychiatric Diagnosis:  none  Other Medical Conditions:  S/p full hysterectomy in 2011  Psychosocial and Contextual Factors:  undocumented      RISK ASSESSMENT (per scale):  Suicide: low  Violence: low  Addiction: moderate/high  Protective Factors: spiritual faith, 2 kids in the [place] and 2 brothers    PLAN: biweekly individual therapy, consult w PMH psychopharm    INFORMED CONSENT (for any new medication):  N/A    Leavy Cella, LICSW

## 2016-09-28 ENCOUNTER — Ambulatory Visit (HOSPITAL_BASED_OUTPATIENT_CLINIC_OR_DEPARTMENT_OTHER): Payer: PRIVATE HEALTH INSURANCE

## 2016-09-28 DIAGNOSIS — F1010 Alcohol abuse, uncomplicated: Principal | ICD-10-CM | POA: Insufficient documentation
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
## 2016-10-01 ENCOUNTER — Other Ambulatory Visit (HOSPITAL_BASED_OUTPATIENT_CLINIC_OR_DEPARTMENT_OTHER): Payer: Self-pay | Admitting: Family Medicine

## 2016-10-01 NOTE — Progress Notes (Signed)
PER Pharmacy,@ is a 56 year old female has requested a refill of phentermine       Last prescribed - start date: 08/17/2016 end date: 09/14/2016           Last Office Visit: 08/17/2016  Last Physical Exam: 02/21/2013        Other Med Adult:  Most Recent BP Reading(s)  08/17/16 : 120/81          Cholesterol (mg/dL)   Date Value   02/06/2016 210   ----------    LOW DENSITY LIPOPROTEIN DIRECT (mg/dL)   Date Value   02/06/2016 112   ----------    HIGH DENSITY LIPOPROTEIN (mg/dL)   Date Value   02/06/2016 71   ----------    TRIGLYCERIDES (mg/dl)   Date Value   01/20/2009 88   ----------        THYROID SCREEN TSH REFLEX FT4 (uIU/mL)   Date Value   02/06/2016 0.785   ----------        TSH (THYROID STIM HORMONE) (uIU/mL)   Date Value   03/24/2015 0.749   ----------      HEMOGLOBIN A1C (%)   Date Value   03/06/2015 5.6   ----------    No results found for: POCA1C        INR (no units)   Date Value   04/22/2008 1.0 (L)   02/07/2005 1.0 (L)   ----------      SODIUM (mmol/L)   Date Value   03/04/2016 143   ----------      POTASSIUM (mmol/L)   Date Value   03/04/2016 3.3 (L)   ----------          CREATININE (mg/dL)   Date Value   03/04/2016 0.6   ----------       Documented patient preferred pharmacies:    [facility]  Phone: [phone] Fax: [phone]

## 2016-10-07 ENCOUNTER — Ambulatory Visit (HOSPITAL_BASED_OUTPATIENT_CLINIC_OR_DEPARTMENT_OTHER): Payer: PRIVATE HEALTH INSURANCE | Admitting: Psychopharm (PRV Practice 11)

## 2016-10-12 ENCOUNTER — Ambulatory Visit (HOSPITAL_BASED_OUTPATIENT_CLINIC_OR_DEPARTMENT_OTHER): Payer: PRIVATE HEALTH INSURANCE

## 2016-10-12 DIAGNOSIS — F1010 Alcohol abuse, uncomplicated: Principal | ICD-10-CM
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
## 2016-10-14 ENCOUNTER — Ambulatory Visit (HOSPITAL_BASED_OUTPATIENT_CLINIC_OR_DEPARTMENT_OTHER): Payer: PRIVATE HEALTH INSURANCE | Admitting: Psychopharm (PRV Practice 11)

## 2016-10-14 DIAGNOSIS — F341 Dysthymic disorder: Principal | ICD-10-CM | POA: Insufficient documentation

## 2016-10-14 DIAGNOSIS — F339 Major depressive disorder, recurrent, unspecified: Secondary | ICD-10-CM | POA: Insufficient documentation

## 2016-10-14 DIAGNOSIS — F418 Other specified anxiety disorders: Secondary | ICD-10-CM | POA: Insufficient documentation

## 2016-10-14 DIAGNOSIS — F439 Reaction to severe stress, unspecified: Secondary | ICD-10-CM | POA: Insufficient documentation

## 2016-10-14 MED ORDER — FLUOXETINE HCL 20 MG PO CAPS: 20 mg | capsule | Freq: Every day | ORAL | 2 refills | 0 days | Status: DC

## 2016-10-14 MED ORDER — FLUOXETINE HCL 20 MG PO CAPS
20.0000 mg | ORAL_CAPSULE | Freq: Every day | ORAL | 2 refills | Status: DC
Start: 2016-10-14 — End: 2017-02-25
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
## 2016-10-26 ENCOUNTER — Ambulatory Visit (HOSPITAL_BASED_OUTPATIENT_CLINIC_OR_DEPARTMENT_OTHER): Payer: PRIVATE HEALTH INSURANCE

## 2016-10-26 DIAGNOSIS — F339 Major depressive disorder, recurrent, unspecified: Principal | ICD-10-CM
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
## 2016-11-04 ENCOUNTER — Ambulatory Visit (HOSPITAL_BASED_OUTPATIENT_CLINIC_OR_DEPARTMENT_OTHER): Payer: PRIVATE HEALTH INSURANCE | Admitting: Psychopharm (PRV Practice 11)

## 2016-11-09 ENCOUNTER — Ambulatory Visit (HOSPITAL_BASED_OUTPATIENT_CLINIC_OR_DEPARTMENT_OTHER): Payer: PRIVATE HEALTH INSURANCE

## 2016-11-30 ENCOUNTER — Ambulatory Visit (HOSPITAL_BASED_OUTPATIENT_CLINIC_OR_DEPARTMENT_OTHER): Payer: PRIVATE HEALTH INSURANCE

## 2016-11-30 DIAGNOSIS — F339 Major depressive disorder, recurrent, unspecified: Principal | ICD-10-CM
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
## 2016-12-10 ENCOUNTER — Encounter (HOSPITAL_BASED_OUTPATIENT_CLINIC_OR_DEPARTMENT_OTHER): Payer: Self-pay | Admitting: Family Medicine

## 2016-12-10 ENCOUNTER — Ambulatory Visit (HOSPITAL_BASED_OUTPATIENT_CLINIC_OR_DEPARTMENT_OTHER): Payer: PRIVATE HEALTH INSURANCE | Admitting: Family Medicine

## 2016-12-10 VITALS — BP 121/70 | HR 84 | Temp 97.8°F | Wt 138.2 lb

## 2016-12-10 DIAGNOSIS — E079 Disorder of thyroid, unspecified: Secondary | ICD-10-CM

## 2016-12-10 DIAGNOSIS — Z6832 Body mass index (BMI) 32.0-32.9, adult: Principal | ICD-10-CM

## 2016-12-10 DIAGNOSIS — F5104 Psychophysiologic insomnia: Secondary | ICD-10-CM

## 2016-12-10 DIAGNOSIS — I10 Essential (primary) hypertension: Secondary | ICD-10-CM

## 2016-12-10 DIAGNOSIS — F339 Major depressive disorder, recurrent, unspecified: Secondary | ICD-10-CM

## 2016-12-10 DIAGNOSIS — E6609 Other obesity due to excess calories: Principal | ICD-10-CM

## 2016-12-10 DIAGNOSIS — K210 Gastro-esophageal reflux disease with esophagitis: Secondary | ICD-10-CM

## 2016-12-10 LAB — BASIC METABOLIC PANEL
ANION GAP: 8 mmol/L (ref 5–15)
BUN (UREA NITROGEN): 26 mg/dL — ABNORMAL HIGH (ref 7–18)
CALCIUM: 9.3 mg/dL (ref 8.5–10.1)
CARBON DIOXIDE: 27 mmol/L (ref 21–32)
CHLORIDE: 102 mmol/L (ref 98–107)
CREATININE: 0.7 mg/dL (ref 0.4–1.2)
ESTIMATED GLOMERULAR FILT RATE: >60 mL/min (ref >60)
Glucose Random: 97 mg/dL (ref 74–160)
POTASSIUM: 4.1 mmol/L (ref 3.5–5.1)
SODIUM: 137 mmol/L (ref 136–145)

## 2016-12-10 LAB — TSH (THYROID STIMULATING HORMONE): TSH (THYROID STIM HORMONE): 0.79 u[IU]/mL (ref 0.358–3.740)

## 2016-12-10 MED ORDER — HYDROCHLOROTHIAZIDE 25 MG PO TABS
25.0000 mg | ORAL_TABLET | Freq: Every day | ORAL | 11 refills | Status: DC
Start: 2016-12-10 — End: 2017-02-25

## 2016-12-10 MED ORDER — PHENTERMINE HCL 37.5 MG PO TABS
37.5000 mg | ORAL_TABLET | Freq: Every morning | ORAL | 0 refills | Status: DC
Start: 2016-12-10 — End: 2017-01-18

## 2016-12-10 MED ORDER — OMEPRAZOLE 20 MG PO CPDR
20.0000 mg | DELAYED_RELEASE_CAPSULE | Freq: Every day | ORAL | 6 refills | Status: DC
Start: 2016-12-10 — End: 2017-02-25

## 2016-12-10 MED ORDER — TRAZODONE HCL 50 MG PO TABS: 50 mg | tablet | Freq: Every evening | ORAL | 11 refills | 0 days | Status: DC

## 2016-12-10 MED ORDER — OMEPRAZOLE 20 MG PO CPDR: 20 mg | capsule | Freq: Every day | ORAL | 6 refills | 0 days | Status: DC

## 2016-12-10 MED ORDER — PHENTERMINE HCL 37.5 MG PO TABS: 38 mg | tablet | Freq: Every morning | ORAL | 0 refills | 0 days | Status: DC

## 2016-12-10 MED ORDER — TRAZODONE HCL 50 MG PO TABS
50.0000 mg | ORAL_TABLET | Freq: Every evening | ORAL | 11 refills | Status: DC
Start: 2016-12-10 — End: 2017-02-25

## 2016-12-10 MED ORDER — HYDROCHLOROTHIAZIDE 25 MG PO TABS: 25 mg | tablet | Freq: Every day | ORAL | 11 refills | 0 days | Status: DC

## 2016-12-10 NOTE — Progress Notes (Signed)
BP 121/70  Pulse 84  Temp 97.8 F (36.6 C)  Wt 62.7 kg (138 lb 3.2 oz)  LMP 07/11/2005  SpO2 100%  BMI 27.49 kg/m2    S:    Stephanie [name] is a 56 year old female who presents with:    Most Recent Weight Reading(s)  08/17/16 : 71.6 kg (157 lb 13.6 oz)  07/19/16 : 70.3 kg (155 lb)  03/04/16 : 67.1 kg (148 lb)  02/06/16 : 68 kg (150 lb)  12/29/15 : 65.3 kg (144 lb)    Feeling much better  Also seeing psych   Working really well     Going to gym every day   Took with another doctor 2 months with other doctor  [place]  Gave 2 refills    he charged $500 per visit    Past Medical History:  No date: Depression  11/21/2012: Hyperopia with astigmatism and presbyopia  11/21/2012: Immature cataract  08/13/2016: Squamous blepharitis of upper and lower eyelid*      Family History    Cancer - Other Father     Comment: stomach    Heart Brother     Comment: died at 15 during valve replacement    Heart Brother     Comment: chestpain with neg w/u    Heart Mother     Comment: Died of MI age 71        Smoking status: Former Smoker                                                              Packs/day: 0.00      Years: 0.00         Types: Cigarettes     Quit date: 07/27/2001  Smokeless tobacco: Never Used                      Alcohol use: Yes           10.0 oz/week     Comment: occasional, weekends      ROS: Per HPI. Denies headaches, weakness/falls, chest pain, shortness of breath, abdominal pain/nausea/vomiting, numbness/tingling, fevers or chills.     Medications, allergies, PMH and SH reviewed and updated as necessary in Epic.    O:  PHYSICAL EXAM:  BP 121/70  Pulse 84  Temp 97.8 F (36.6 C)  Wt 62.7 kg (138 lb 3.2 oz)  LMP 07/11/2005  SpO2 100%  BMI 27.49 kg/m2  GEN: NAD  HEENT: MMM  SKIN: WWP  CHEST: Breathing comfortably  NEURO: No focal deficits noted, alert  MSK: Normal gait observed  PSYCH: Appears stated age, appropriate grooming and dress, appropriate affect and mood congruent    A/P:  Stephanie [name] is a 56 year old female presents  with:    1. Hypertension, goal below 140/90  At goal - K low last check in ED - will check again today  - hydrochlorothiazide (HYDRODIURIL) 25 MG tablet; Take 1 tablet by mouth daily  Dispense: 30 tablet; Refill: 11  - BASIC METABOLIC PANEL    2. Gastroesophageal reflux disease with esophagitis  Reviewed not taking daily - recommend H2b but not interested  - omeprazole (PRILOSEC) 20 MG capsule; Take 1 capsule by mouth daily  Dispense: 30 capsule; Refill: 6  3. Class 1 obesity due to excess calories without serious comorbidity with body mass index (BMI) of 32.0 to 32.9 in adult  Reviewed excellent progress - can watch to nadir within reason and will keep on meds at stable wt for 6 months before dropping to 15 - already know that pt gains wt once off med.   Estimated body mass index is 27.49 kg/m as calculated from the following:    Height as of 08/17/16: 4' 11.45" (1.51 m).    Weight as of this encounter: 62.7 kg (138 lb 3.2 oz).    - phentermine (ADIPEX-P) 37.5 MG tablet; Take 1 tablet by mouth every morning before breakfast  Dispense: 28 tablet; Refill: 0  - TSH (THYROID STIMULATING HORMONE)    4. Thyroid disorder  Not taking meds anymore - no recent checks  - TSH (THYROID STIMULATING HORMONE)    5. Depression, recurrent ([place])  Pt reports taking librium from [place] - reviewed this is a benzo and I don't recommend it - she feels fluoxetine not helping - reviewed if sleep seems to be main issue (feeling much better since using librium and sleeping well) will trial trazadone and then [place] - but trial non benzo first.     6. Psychophysiological insomnia  As above      We discussed the patients current medications. The patient expressed understanding and no barriers to adherence were identified.    3 mo fu with [place] Maintenance:  Reviewed HM    Jagdeep Ancheta R. Katy Apo, MD

## 2016-12-17 ENCOUNTER — Telehealth (HOSPITAL_BASED_OUTPATIENT_CLINIC_OR_DEPARTMENT_OTHER): Payer: Self-pay
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
## 2016-12-27 ENCOUNTER — Ambulatory Visit (HOSPITAL_BASED_OUTPATIENT_CLINIC_OR_DEPARTMENT_OTHER): Payer: PRIVATE HEALTH INSURANCE

## 2016-12-27 DIAGNOSIS — F339 Major depressive disorder, recurrent, unspecified: Principal | ICD-10-CM
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
## 2016-12-29 ENCOUNTER — Ambulatory Visit (HOSPITAL_BASED_OUTPATIENT_CLINIC_OR_DEPARTMENT_OTHER): Payer: PRIVATE HEALTH INSURANCE | Admitting: Family Medicine

## 2017-01-11 ENCOUNTER — Ambulatory Visit (HOSPITAL_BASED_OUTPATIENT_CLINIC_OR_DEPARTMENT_OTHER): Payer: PRIVATE HEALTH INSURANCE

## 2017-01-18 ENCOUNTER — Other Ambulatory Visit (HOSPITAL_BASED_OUTPATIENT_CLINIC_OR_DEPARTMENT_OTHER): Payer: Self-pay | Admitting: Family Medicine

## 2017-01-18 DIAGNOSIS — E6609 Other obesity due to excess calories: Principal | ICD-10-CM

## 2017-01-18 DIAGNOSIS — Z6832 Body mass index (BMI) 32.0-32.9, adult: Principal | ICD-10-CM

## 2017-01-18 NOTE — Progress Notes (Signed)
PER Pharmacy, Stephanie [name] is a 56 year old female has requested a refill of adipex     Last prescribed - start date: 12/10/2016 end date: 01/07/2017        Last Office Visit: 12/10/2016  Last Physical Exam: 02/21/2013      Other Med Adult:  Most Recent BP Reading(s)  12/10/16 : 121/70          Cholesterol (mg/dL)   Date Value   02/06/2016 210   ----------    LOW DENSITY LIPOPROTEIN DIRECT (mg/dL)   Date Value   02/06/2016 112   ----------    HIGH DENSITY LIPOPROTEIN (mg/dL)   Date Value   02/06/2016 71   ----------    TRIGLYCERIDES (mg/dl)   Date Value   01/20/2009 88   ----------        THYROID SCREEN TSH REFLEX FT4 (uIU/mL)   Date Value   02/06/2016 0.785   ----------        TSH (THYROID STIM HORMONE) (uIU/mL)   Date Value   12/10/2016 0.790   ----------      HEMOGLOBIN A1C (%)   Date Value   03/06/2015 5.6   ----------    No results found for: POCA1C        INR (no units)   Date Value   04/22/2008 1.0 (L)   02/07/2005 1.0 (L)   ----------      SODIUM (mmol/L)   Date Value   12/10/2016 137   ----------      POTASSIUM (mmol/L)   Date Value   12/10/2016 4.1   ----------          CREATININE (mg/dL)   Date Value   12/10/2016 0.7   ----------    Documented patient preferred pharmacies:    [place], [place], [place] [facility] [place]  Phone: [phone] Fax: [phone]

## 2017-02-22 ENCOUNTER — Ambulatory Visit (HOSPITAL_BASED_OUTPATIENT_CLINIC_OR_DEPARTMENT_OTHER): Payer: No Typology Code available for payment source

## 2017-02-22 DIAGNOSIS — F339 Major depressive disorder, recurrent, unspecified: Principal | ICD-10-CM
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
## 2017-02-25 ENCOUNTER — Ambulatory Visit (HOSPITAL_BASED_OUTPATIENT_CLINIC_OR_DEPARTMENT_OTHER): Payer: No Typology Code available for payment source | Admitting: Family Medicine

## 2017-02-25 VITALS — BP 121/83 | HR 82 | Temp 98.0°F | Wt 137.2 lb

## 2017-02-25 DIAGNOSIS — F339 Major depressive disorder, recurrent, unspecified: Secondary | ICD-10-CM

## 2017-02-25 DIAGNOSIS — I10 Essential (primary) hypertension: Secondary | ICD-10-CM

## 2017-02-25 DIAGNOSIS — F418 Other specified anxiety disorders: Secondary | ICD-10-CM

## 2017-02-25 DIAGNOSIS — F439 Reaction to severe stress, unspecified: Secondary | ICD-10-CM

## 2017-02-25 DIAGNOSIS — F341 Dysthymic disorder: Secondary | ICD-10-CM

## 2017-02-25 DIAGNOSIS — M79602 Pain in left arm: Secondary | ICD-10-CM

## 2017-02-25 DIAGNOSIS — K2100 Gastro-esophageal reflux disease with esophagitis, without bleeding: Secondary | ICD-10-CM

## 2017-02-25 DIAGNOSIS — E6609 Other obesity due to excess calories: Secondary | ICD-10-CM

## 2017-02-25 DIAGNOSIS — Z6832 Body mass index (BMI) 32.0-32.9, adult: Secondary | ICD-10-CM

## 2017-02-25 DIAGNOSIS — R079 Chest pain, unspecified: Principal | ICD-10-CM

## 2017-02-25 DIAGNOSIS — Z1239 Encounter for other screening for malignant neoplasm of breast: Secondary | ICD-10-CM

## 2017-02-25 DIAGNOSIS — R220 Localized swelling, mass and lump, head: Secondary | ICD-10-CM

## 2017-02-25 MED ORDER — TRAZODONE HCL 50 MG PO TABS
50.0000 mg | ORAL_TABLET | Freq: Every evening | ORAL | 11 refills | Status: DC
Start: 2017-02-25 — End: 2018-08-18

## 2017-02-25 MED ORDER — TRAZODONE HCL 50 MG PO TABS: 50 mg | tablet | Freq: Every evening | ORAL | 11 refills | 0 days | Status: AC

## 2017-02-25 MED ORDER — OMEGA 3 1000 MG PO CAPS: 1 | capsule | Freq: Every day | ORAL | 3 refills | 0 days | Status: AC

## 2017-02-25 MED ORDER — HYDROCHLOROTHIAZIDE 25 MG PO TABS
25.0000 mg | ORAL_TABLET | Freq: Every day | ORAL | 5 refills | Status: DC
Start: 2017-02-25 — End: 2017-09-16

## 2017-02-25 MED ORDER — HYDROCHLOROTHIAZIDE 25 MG PO TABS: 25 mg | tablet | Freq: Every day | ORAL | 5 refills | 0 days | Status: DC

## 2017-02-25 MED ORDER — OMEGA 3 1000 MG PO CAPS
1.0000 | ORAL_CAPSULE | Freq: Every day | ORAL | 3 refills | Status: AC
Start: 2017-02-25 — End: 2017-05-26

## 2017-02-25 MED ORDER — FLUOXETINE HCL 20 MG PO CAPS: 20 mg | capsule | Freq: Every day | ORAL | 2 refills | 0 days | Status: DC

## 2017-02-25 MED ORDER — VALACYCLOVIR HCL 500 MG PO TABS: 1000 mg | tablet | Freq: Three times a day (TID) | ORAL | 0 refills | 0 days | Status: AC

## 2017-02-25 MED ORDER — PHENTERMINE HCL 37.5 MG PO TABS
37.5000 mg | ORAL_TABLET | Freq: Every morning | ORAL | 0 refills | Status: DC
Start: 2017-02-25 — End: 2017-03-24

## 2017-02-25 MED ORDER — OMEPRAZOLE 20 MG PO CPDR: 20 mg | capsule | Freq: Every day | ORAL | 6 refills | 0 days | Status: DC

## 2017-02-25 MED ORDER — PHENTERMINE HCL 37.5 MG PO TABS: 38 mg | tablet | Freq: Every morning | ORAL | 0 refills | 0 days | Status: DC

## 2017-02-25 MED ORDER — OMEPRAZOLE 20 MG PO CPDR
20.0000 mg | DELAYED_RELEASE_CAPSULE | Freq: Every day | ORAL | 6 refills | Status: DC
Start: 2017-02-25 — End: 2017-09-16

## 2017-02-25 MED ORDER — FLUOXETINE HCL 20 MG PO CAPS
20.0000 mg | ORAL_CAPSULE | Freq: Every day | ORAL | 2 refills | Status: DC
Start: 2017-02-25 — End: 2017-06-28

## 2017-02-25 MED ORDER — VALACYCLOVIR HCL 500 MG PO TABS
1000.00 mg | ORAL_TABLET | Freq: Three times a day (TID) | ORAL | 0 refills | Status: AC
Start: 2017-02-25 — End: 2017-03-04

## 2017-02-25 NOTE — Progress Notes (Addendum)
Clinic Note:    SUBJECTIVE:  Stephanie [name] is a 56 year old female with the following active problem list:    Patient Active Problem List:     Urinary calculus, unspecified     Class 1 obesity due to excess calories without serious comorbidity with body mass index (BMI) of 32.0 to 32.9 in adult     Pure hypercholesterolemia     Family history of malignant neoplasm of gastrointestinal tract     Leiomyoma of uterus, unspecified     Irritable bladder     Elevated hemoglobin A1c     Other plastic surgery for unacceptable cosmetic appearance     Impingement syndrome of right shoulder     Tubular adenoma of colon     Diverticulosis of colon     Hyperopia with astigmatism and presbyopia     Immature cataract     Hypertension, goal below 140/90     Mass of hand     Thyroid disorder     Schistosomiasis     Adenomatous polyp of colon     Chronic right shoulder pain     Squamous 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 eyes     Alcohol abuse     Persistent depressive disorder     Depression, recurrent ([place])     Other specified anxiety disorders     Trauma and stressor-related disorder    Due to the language barrier, the office visit was conducted in [place] with an interpreter.  The interpreter was on the phone during the entire visit.        CC:     1) left arm pain  -x a couple days  -feels like a burn  -hurts with light touch   -radiates to chest, some tenderness of chest wall   -ibuprofen with some relief  -EKG today NSR      2) lump left eyebrow  -x 2 months, getting bigger  -bothersome but not painful      3) needs refills of all meds  -including trazodone    Most Recent Weight Reading(s)  02/25/17 : 62.2 kg (137 lb 3.2 oz)  12/10/16 : 62.7 kg (138 lb 3.2 oz)  08/17/16 : 71.6 kg (157 lb 13.6 oz)        Past Medical History:  No date: Depression  11/21/2012: Hyperopia with astigmatism and presbyopia  11/21/2012: Immature cataract  08/13/2016: Squamous blepharitis of upper and lower eyelid*    Past Surgical  History:  No date: BREAST ENHANCEMENT SURGERY      Comment: 12/12 had bilateral mammoplasty and saline                implants, had 2nd surgery 5/30 for revision  No date: OB ANTEPARTUM CARE CESAREAN DLVR & POSTPARTUM      Comment: x3  No date: TOTAL ABDOMINAL HYSTERECT W/WO RMVL TUBE OVARY      Comment: for fibroids, still has cervix      Current Outpatient Prescriptions on File Prior to Visit:  phentermine (ADIPEX-P) 37.5 MG tablet Take 1 tablet by mouth every morning before breakfast Disp: 28 tablet Rfl: 0   hydrochlorothiazide (HYDRODIURIL) 25 MG tablet Take 1 tablet by mouth daily Disp: 30 tablet Rfl: 11   omeprazole (PRILOSEC) 20 MG capsule Take 1 capsule by mouth daily Disp: 30 capsule Rfl: 6   traZODone (DESYREL) 50 MG tablet Take 1 tablet by mouth nightly Disp: 30 tablet Rfl: 11   FLUoxetine (PROZAC) 20  MG capsule Take 1 capsule by mouth daily Disp: 30 capsule Rfl: 2   erythromycin (ROMYCIN) ophthalmic ointment Apply to both eyes, every evening Disp: 3.5 g Rfl: 12   meloxicam (MOBIC) 7.5 MG tablet Take 1 tablet by mouth daily Disp:  Rfl:    mirtazapine (REMERON) 15 MG tablet Take 1 tablet by mouth nightly Disp: 30 tablet Rfl: 0   OMEGA 3 1000 MG OR CAPS 1 taily Disp:  Rfl:      No current facility-administered medications on file prior to visit.     Review of Patient's Allergies indicates:  No Known Allergies      Family History    Cancer - Other Father     Comment: stomach    Heart Brother     Comment: died at 61 during valve replacement    Heart Brother     Comment: chestpain with neg w/u    Heart Mother     Comment: Died of MI age 67       Social History    Marital status: Divorced            Spouse name:                       Years of education:                 Number of children:               Occupational History  Occupation          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; lives                                           w/roommates    Social  History Main Topics    Smoking status: Former Smoker                                                                Packs/day: 0.00      Years: 0.00           Types: Cigarettes       Quit date: 07/27/2001    Smokeless tobacco: Never Used                        Alcohol use: Yes           10.0 oz/week       Comment: occasional, weekends    Drug use: No              Sexual activity: Not Currently     Partners with: Female       Comment: G3P3, no hx STD, no hx abn Pap        OBJECTIVE:  BP 121/83  Pulse 82  Temp 98 F (36.7  C)  Wt 62.2 kg (137 lb 3.2 oz)  LMP 07/11/2005  SpO2 100%  BMI 27.29 kg/m2            Physical Exam   Constitutional: She appears well-developed and well-nourished.   Cardiovascular: Normal rate, regular rhythm and normal heart sounds.    Discomfort with palpation of chest wall and L outer breast. No masses.    Pulmonary/Chest: Effort normal and breath sounds normal. No respiratory distress. She has no wheezes.   Musculoskeletal: Normal range of motion.   Neurological: She is alert.   Skin:   Flesh colored mass lateral to L eyebrow. Freely moveable, non tender.     L upper arm without rashes. Pain with light touch of upper arm.    Psychiatric: She has a normal mood and affect. Her behavior is normal. Judgment and thought content normal.       A/P:    (R07.9) Chest pain, unspecified type  (primary encounter diagnosis)  Comment: more with light touch extending from upper arm. Unlikely cardiac. EKG nsr      (M79.602) Left arm pain  Comment: no rashes but pain with light touch and does follow dermatome so could be shingles  Plan:   -trial of valacyclovir      (R22.0) Lump on face  Comment: likely small lipoma or other benign mass  Plan:   -REFERRAL TO ENT ( INT)         (Z12.31) Screening for malignant neoplasm of breast  Plan:   -[facility] MAMMOGRAPHY SCREENING BILATERAL W CAD           Refilled meds for pt      1. The patient indicates understanding of these issues and agrees with the plan.  2.  The patient is  given an After Visit Summary sheet that lists all of their medications with directions, their allergies, orders placed during this encounter, immunization dates, and follow- up instructions.  3. I reviewed the patient's medical information and medical history   4.  I reconciled the patient's medication list and prepared and supplied needed refills.  5.  I have reviewed the past medical, family, and social history sections including the medications and allergies listed in the above medical record    Marquis Lunch, PA-C, 02/25/2017, 2:25 PM

## 2017-03-01 ENCOUNTER — Ambulatory Visit (HOSPITAL_BASED_OUTPATIENT_CLINIC_OR_DEPARTMENT_OTHER): Payer: Self-pay | Admitting: Family Medicine

## 2017-03-01 DIAGNOSIS — Z1239 Encounter for other screening for malignant neoplasm of breast: Principal | ICD-10-CM

## 2017-03-01 NOTE — Addendum Note (Signed)
Addended by: Tami Lin on: 03/01/2017 03:32 PM     Modules accepted: Orders

## 2017-03-03 LAB — MA SCREENING MAMMO BILATERAL DIGITAL WITH DBT & CAD

## 2017-03-03 NOTE — Addendum Note (Signed)
Addended by: [name]. on: 03/03/2017 08:02 AM     Modules accepted: Orders

## 2017-03-09 LAB — EKG

## 2017-03-22 ENCOUNTER — Ambulatory Visit (HOSPITAL_BASED_OUTPATIENT_CLINIC_OR_DEPARTMENT_OTHER): Payer: No Typology Code available for payment source

## 2017-03-22 DIAGNOSIS — F339 Major depressive disorder, recurrent, unspecified: Principal | ICD-10-CM
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
## 2017-03-24 ENCOUNTER — Encounter (HOSPITAL_BASED_OUTPATIENT_CLINIC_OR_DEPARTMENT_OTHER): Payer: Self-pay | Admitting: Family Medicine

## 2017-03-24 ENCOUNTER — Ambulatory Visit (HOSPITAL_BASED_OUTPATIENT_CLINIC_OR_DEPARTMENT_OTHER): Payer: No Typology Code available for payment source | Admitting: Family Medicine

## 2017-03-24 VITALS — BP 126/84 | HR 88 | Temp 97.2°F | Wt 137.0 lb

## 2017-03-24 DIAGNOSIS — M25562 Pain in left knee: Secondary | ICD-10-CM

## 2017-03-24 DIAGNOSIS — Z6832 Body mass index (BMI) 32.0-32.9, adult: Secondary | ICD-10-CM

## 2017-03-24 DIAGNOSIS — E6609 Other obesity due to excess calories: Secondary | ICD-10-CM

## 2017-03-24 DIAGNOSIS — M79602 Pain in left arm: Principal | ICD-10-CM

## 2017-03-24 MED ORDER — PHENTERMINE HCL 37.5 MG PO TABS
37.5000 mg | ORAL_TABLET | Freq: Every morning | ORAL | 0 refills | Status: DC
Start: 2017-03-25 — End: 2017-05-17

## 2017-03-24 MED ORDER — GABAPENTIN 300 MG PO CAPS
ORAL_CAPSULE | ORAL | 0 refills | Status: DC
Start: 2017-03-24 — End: 2017-04-08

## 2017-03-24 MED ORDER — PHENTERMINE HCL 37.5 MG PO TABS: 38 mg | tablet | Freq: Every morning | ORAL | 0 refills | 0 days | Status: DC

## 2017-03-24 MED ORDER — GABAPENTIN 300 MG PO CAPS: capsule | ORAL | 0 refills | 0 days | Status: DC

## 2017-03-24 NOTE — Progress Notes (Signed)
Clinic Note:    SUBJECTIVE:  Stephanie [name] is a 56 year old female with the following active problem list:    Patient Active Problem List:     Urinary calculus, unspecified     Class 1 obesity due to excess calories without serious comorbidity with body mass index (BMI) of 32.0 to 32.9 in adult     Pure hypercholesterolemia     Family history of malignant neoplasm of gastrointestinal tract     Leiomyoma of uterus, unspecified     Irritable bladder     Elevated hemoglobin A1c     Other plastic surgery for unacceptable cosmetic appearance     Impingement syndrome of right shoulder     Tubular adenoma of colon     Diverticulosis of colon     Hyperopia with astigmatism and presbyopia     Immature cataract     Hypertension, goal below 140/90     Mass of hand     Thyroid disorder     Schistosomiasis     Adenomatous polyp of colon     Chronic right shoulder pain     Squamous 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 eyes     Alcohol abuse     Persistent depressive disorder     Depression, recurrent ([place])     Other specified anxiety disorders     Trauma and stressor-related disorder      Due to the language barrier, the office visit was conducted in [place] with an interpreter.  The interpreter was on the phone during the entire visit.      CC:     1) left arm pain   -thought to be possible herpes outbreak at last visit given pain with light touch, in dermatomal distribution and burning pain   -no injury   -"burning pain"  -radiates to chest  -hurts with light touch   -taking valacyclovir without help  -no injury   -ice and icy hot helping some  -EKG at last visit NSR      2) left knee pain   -x months  -worse with walking  -requesting xray today       Past Medical History:  No date: Depression  11/21/2012: Hyperopia with astigmatism and presbyopia  11/21/2012: Immature cataract  08/13/2016: Squamous blepharitis of upper and lower eyelid*    Past Surgical History:  No date: BREAST ENHANCEMENT SURGERY      Comment: 12/12  had bilateral mammoplasty and saline                implants, had 2nd surgery 5/30 for revision  No date: OB ANTEPARTUM CARE CESAREAN DLVR & POSTPARTUM      Comment: x3  No date: TOTAL ABDOMINAL HYSTERECT W/WO RMVL TUBE OVARY      Comment: for fibroids, still has cervix      Current Outpatient Prescriptions on File Prior to Visit:  phentermine (ADIPEX-P) 37.5 MG tablet Take 1 tablet by mouth every morning before breakfast Disp: 28 tablet Rfl: 0   hydrochlorothiazide (HYDRODIURIL) 25 MG tablet Take 1 tablet by mouth daily Disp: 30 tablet Rfl: 5   omeprazole (PRILOSEC) 20 MG capsule Take 1 capsule by mouth daily Disp: 30 capsule Rfl: 6   traZODone (DESYREL) 50 MG tablet Take 1 tablet by mouth nightly Disp: 30 tablet Rfl: 11   FLUoxetine (PROZAC) 20 MG capsule Take 1 capsule by mouth daily Disp: 30 capsule Rfl: 2   Omega 3 1000 MG CAPS Take  1 tablet by mouth daily Disp: 90 capsule Rfl: 3   erythromycin (ROMYCIN) ophthalmic ointment Apply to both eyes, every evening Disp: 3.5 g Rfl: 12   meloxicam (MOBIC) 7.5 MG tablet Take 1 tablet by mouth daily Disp:  Rfl:      No current facility-administered medications on file prior to visit.     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    Social History Main Topics    Smoking status: Former Smoker                                                                Packs/day: 0.00       Years: 0.00           Types: Cigarettes       Quit date: 07/27/2001    Smokeless tobacco: Never Used                        Alcohol use: Yes           10.0 oz/week       Comment: occasional, weekends    Drug use: No              Sexual activity: Not Currently     Partners with: Female       Comment: G3P3, no hx STD, no hx abn Pap        OBJECTIVE:  BP 126/84 (Site: LA, Position: Sitting, Cuff Size: Reg)  Pulse 88  Temp 97.2 F (36.2 C) (Temporal)  Wt 62.1 kg (137 lb)  LMP 07/11/2005  SpO2 100%  BMI 27.25 kg/m2  Physical Exam   Constitutional: She appears well-developed and well-nourished.   Musculoskeletal:   L knee without deformities/swelling. Full ROM.some jt line tenderness. LE strength 5/5 .Sensation intact.        Neurological: She is alert.   Skin:   L upper arm without rashes. Pain with light touch of upper arm and left chest   Psychiatric: She has a normal mood and affect. Her behavior is normal. Judgment and thought content normal.         A/P:    (M79.602) Left arm pain  (primary encounter diagnosis)  Comment: could be post herpetic neuralgia at this point  Plan:  -trial of gabapentin (NEURONTIN) 300 MG capsule  -f/u 1 month            (M25.562) Left knee pain, unspecified chronicity  Comment: ? OA  Plan:   -XR KNEE LEFT 3 VW  -can try PT as next step            (E66.09,  Z68.32) Class 1 obesity due to excess calories without serious comorbidity with body mass index (BMI) of 32.0 to 32.9 in adult  Plan:   -refilled phentermine (ADIPEX-P) 37.5 MG tablet               1. The patient indicates understanding of these issues and agrees with the plan.  2.  The patient is given an After Visit Summary sheet that lists all of their medications with directions, their allergies, orders placed during this encounter, immunization dates, and follow- up instructions.  3. I reviewed the patient's medical information and medical history   4.  I reconciled the patient's medication list and prepared and supplied  needed refills.  5.  I have reviewed the past medical, family, and social history sections including the medications and allergies listed in the above medical record    Marquis Lunch, PA-C, 03/24/2017, 9:55 AM

## 2017-03-25 ENCOUNTER — Telehealth (HOSPITAL_BASED_OUTPATIENT_CLINIC_OR_DEPARTMENT_OTHER): Payer: Self-pay | Admitting: Clinic/Center

## 2017-03-25 ENCOUNTER — Ambulatory Visit (HOSPITAL_BASED_OUTPATIENT_CLINIC_OR_DEPARTMENT_OTHER): Payer: No Typology Code available for payment source | Admitting: Otolaryngology

## 2017-03-25 NOTE — Progress Notes (Signed)
Call via [place] telephone interpreter   Unable to reach the patient  Message left for the patient to call the clinc to discss your symptoms and review instructions on how to take gabapentin medication    Gabapentin 300 mg   Take 1 tab po daily x 3 days, then can increase 1 tab po bid x 3 days, then increase to 1 tab po tid          [name], PA  P [facility]            Hi,     Can you pls check in on pt? How is her pain? Does she have any questions about how to take gabapentin? TY!     -Rosanna

## 2017-04-01 ENCOUNTER — Ambulatory Visit (HOSPITAL_BASED_OUTPATIENT_CLINIC_OR_DEPARTMENT_OTHER): Payer: Self-pay | Admitting: Family Medicine

## 2017-04-01 ENCOUNTER — Telehealth (HOSPITAL_BASED_OUTPATIENT_CLINIC_OR_DEPARTMENT_OTHER): Payer: Self-pay | Admitting: Registered Nurse

## 2017-04-01 DIAGNOSIS — M25562 Pain in left knee: Principal | ICD-10-CM

## 2017-04-01 LAB — XR KNEE LEFT 3 VIEWS

## 2017-04-01 NOTE — Progress Notes (Signed)
Pt calls c/o L arm pain stating that it has not improved at all, requesting to speak with nurse directly  She informs she has tapered Gabapentin up to TID and has had no change in pain at all.   She is able to move her arm but due to her pain level, she has not been using it and does everything with her other arm and feet.   Denies rash/swelling etc.  Advised I would consult with provider and call back.

## 2017-04-01 NOTE — Progress Notes (Signed)
Advise pt of providers advise. Patient made appnt to see provider next Friday.

## 2017-04-01 NOTE — Addendum Note (Signed)
Addended by: Darvin Neighbours on: 04/01/2017 08:00 AM     Modules accepted: Orders

## 2017-04-01 NOTE — Progress Notes (Signed)
RNs to Call patient and discussed labs (see tele note for more details).

## 2017-04-04 ENCOUNTER — Telehealth (HOSPITAL_BASED_OUTPATIENT_CLINIC_OR_DEPARTMENT_OTHER): Payer: Self-pay | Admitting: Community Health

## 2017-04-04 NOTE — Progress Notes (Signed)
Attempted to contact patient regarding recent xray results of left knee. Patient instructed to call clinic back to review results. Provider would like patient to try physical therapy if she is willing.

## 2017-04-05 ENCOUNTER — Ambulatory Visit (HOSPITAL_BASED_OUTPATIENT_CLINIC_OR_DEPARTMENT_OTHER): Payer: No Typology Code available for payment source

## 2017-04-05 ENCOUNTER — Ambulatory Visit (HOSPITAL_BASED_OUTPATIENT_CLINIC_OR_DEPARTMENT_OTHER): Payer: No Typology Code available for payment source | Admitting: Otolaryngology

## 2017-04-05 VITALS — BP 124/81 | HR 76 | Temp 96.8°F

## 2017-04-05 DIAGNOSIS — R220 Localized swelling, mass and lump, head: Principal | ICD-10-CM

## 2017-04-08 ENCOUNTER — Ambulatory Visit (HOSPITAL_BASED_OUTPATIENT_CLINIC_OR_DEPARTMENT_OTHER): Payer: No Typology Code available for payment source | Admitting: Family Medicine

## 2017-04-08 ENCOUNTER — Encounter (HOSPITAL_BASED_OUTPATIENT_CLINIC_OR_DEPARTMENT_OTHER): Payer: Self-pay | Admitting: Family Medicine

## 2017-04-08 VITALS — BP 121/82 | HR 77 | Temp 97.0°F | Wt 136.0 lb

## 2017-04-08 DIAGNOSIS — M79602 Pain in left arm: Principal | ICD-10-CM

## 2017-04-08 NOTE — Progress Notes (Signed)
Clinic Note:    SUBJECTIVE:  Stephanie [name] is a 56 year old female with the following active problem list:    Patient Active Problem List:     Urinary calculus, unspecified     Class 1 obesity due to excess calories without serious comorbidity with body mass index (BMI) of 32.0 to 32.9 in adult     Pure hypercholesterolemia     Family history of malignant neoplasm of gastrointestinal tract     Leiomyoma of uterus, unspecified     Irritable bladder     Elevated hemoglobin A1c     Other plastic surgery for unacceptable cosmetic appearance     Impingement syndrome of right shoulder     Tubular adenoma of colon     Diverticulosis of colon     Hyperopia with astigmatism and presbyopia     Immature cataract     Hypertension, goal below 140/90     Mass of hand     Thyroid disorder     Schistosomiasis     Adenomatous polyp of colon     Chronic right shoulder pain     Squamous 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 eyes     Alcohol abuse     Persistent depressive disorder     Depression, recurrent ([place])     Other specified anxiety disorders     Trauma and stressor-related disorder    Due to the language barrier, the office visit was conducted in [place] with an interpreter.  The interpreter was on the phone during the entire visit.        CC:     1) left arm pain  -x > 1 month   -"burning pain"  -radiates to chest   -tx for zoster did not help   -no injury   -not able to raise arm overhead   -no neck pain   -taking meloxicam with some relief      2) left knee pain   -reviewed xray:   Impression: Mild degenerative changes of the left knee          Past Medical History:  No date: Depression  11/21/2012: Hyperopia with astigmatism and presbyopia  11/21/2012: Immature cataract  08/13/2016: Squamous blepharitis of upper and lower eyelid*    Past Surgical History:  No date: BREAST ENHANCEMENT SURGERY      Comment: 12/12 had bilateral mammoplasty and saline                implants, had 2nd surgery 5/30 for revision  No  date: OB ANTEPARTUM CARE CESAREAN DLVR & POSTPARTUM      Comment: x3  No date: TOTAL ABDOMINAL HYSTERECT W/WO RMVL TUBE OVARY      Comment: for fibroids, still has cervix      Current Outpatient Prescriptions on File Prior to Visit:  phentermine (ADIPEX-P) 37.5 MG tablet Take 1 tablet by mouth every morning before breakfast Disp: 28 tablet Rfl: 0   gabapentin (NEURONTIN) 300 MG capsule Take 1 tab po daily x 3 days, then can increase 1 tab po bid x 3 days, then increase to 1 tab po tid Disp: 90 capsule Rfl: 0   hydrochlorothiazide (HYDRODIURIL) 25 MG tablet Take 1 tablet by mouth daily Disp: 30 tablet Rfl: 5   omeprazole (PRILOSEC) 20 MG capsule Take 1 capsule by mouth daily Disp: 30 capsule Rfl: 6   traZODone (DESYREL) 50 MG tablet Take 1 tablet by mouth nightly Disp: 30 tablet Rfl: 11  FLUoxetine (PROZAC) 20 MG capsule Take 1 capsule by mouth daily Disp: 30 capsule Rfl: 2   Omega 3 1000 MG CAPS Take 1 tablet by mouth daily Disp: 90 capsule Rfl: 3   erythromycin (ROMYCIN) ophthalmic ointment Apply to both eyes, every evening Disp: 3.5 g Rfl: 12   meloxicam (MOBIC) 7.5 MG tablet Take 1 tablet by mouth daily Disp:  Rfl:      No current facility-administered medications on file prior to visit.     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    Social History Main Topics    Smoking status: Former Smoker                                                                 Packs/day: 0.00      Years: 0.00           Types: Cigarettes       Quit date: 07/27/2001    Smokeless tobacco: Never Used                        Alcohol use: Yes           10.0 oz/week       Comment: occasional, weekends    Drug use: No              Sexual activity: Not Currently     Partners with: Female       Comment: G3P3, no hx STD, no hx abn Pap        OBJECTIVE:  BP 121/82  Pulse 77  Temp 97 F (36.1 C) (Temporal)  Wt 61.7 kg (136 lb)  LMP 07/11/2005  SpO2 100%  BMI 27.06 kg/m2            Physical Exam   Constitutional: She appears well-developed and well-nourished.   Musculoskeletal:   L arm without deformities/swelling. Pain with overhead motion. No spinal tenderness. DTRs 2+. UE strength 5/5 .Pt has pain over deltoid. Sensation intact.     Neurological: She is alert.   Skin: Skin is warm and dry.   Psychiatric: She has a normal mood and affect. Her behavior is normal. Judgment and thought content normal.         A/P:    (M79.602) Left arm pain  (primary encounter diagnosis)  Comment: possible impingement?  Plan:   -REFERRAL TO ORTHOPEDICS ( INT)  -offered pt, pt refused at this time  -ice/heat, icy hot, mobic prn with food                1. The patient indicates understanding of these issues and agrees with the plan.  2.  The patient is given an After Visit Summary sheet that lists all of their medications with directions, their allergies, orders placed during this encounter, immunization dates, and follow- up instructions.  3. I reviewed the patient's medical information and medical history   4.  I reconciled the patient's medication list and prepared and supplied needed refills.  5.  I have reviewed the past medical, family, and social history sections including the medications and allergies listed in the above medical record    Marquis Lunch, PA-C, 04/08/2017, 12:19 PM

## 2017-04-10 NOTE — Progress Notes (Signed)
HPI: 56 year old female Stephanie [name] is seen again for new complaint(s) of a left anterior temporal lump for 2 mo that is getting a little bigger. Symptom is constant without fluctuation. Pt has no dyspnea, no dysphagia, no fever today.    Past facial or throat surgery: No  FH of facial or pharyngeal condition: No     Sx Relief factors No  Sx Exacerbation factors No    Review of Patient's Allergies indicates:  No Known Allergies    Past Medical History:  No date: Depression  11/21/2012: Hyperopia with astigmatism and presbyopia  11/21/2012: Immature cataract  08/13/2016: Squamous blepharitis of upper and lower eyelid*      Current Outpatient Prescriptions on File Prior to Visit:  phentermine (ADIPEX-P) 37.5 MG tablet Take 1 tablet by mouth every morning before breakfast Disp: 28 tablet Rfl: 0   hydrochlorothiazide (HYDRODIURIL) 25 MG tablet Take 1 tablet by mouth daily Disp: 30 tablet Rfl: 5   omeprazole (PRILOSEC) 20 MG capsule Take 1 capsule by mouth daily Disp: 30 capsule Rfl: 6   FLUoxetine (PROZAC) 20 MG capsule Take 1 capsule by mouth daily Disp: 30 capsule Rfl: 2   Omega 3 1000 MG CAPS Take 1 tablet by mouth daily Disp: 90 capsule Rfl: 3   meloxicam (MOBIC) 7.5 MG tablet Take 1 tablet by mouth daily Disp:  Rfl:    traZODone (DESYREL) 50 MG tablet Take 1 tablet by mouth nightly Disp: 30 tablet Rfl: 11   erythromycin (ROMYCIN) ophthalmic ointment Apply to both eyes, every evening Disp: 3.5 g Rfl: 12     No current facility-administered medications on file prior to visit.     Social and family history reviewed in [place] and are not changed.    Review of Systems  Constitutional No.  CV/Palpitations No.  Resp/Breathing No.  MSk/Pain No.  Integ/Skin lesions Yes.  Psych normal.    Physical Exam   04/05/17  1341   BP: 124/81   Pulse: 76   Temp: 96.8 F (36 C)       Constitutional: normal  Face: left anterior temporal subcutaneous lesion 40mm lateral to the eye brow.   Ears: Right: Obstructing cerumen:No. EAC dry and open.  TM normal.    Left:  Obstructing cerumen:No. EAC dry and open. TM normal.   Nose:  normal.  Oral Cavity: normal.  Neck: Soft. Trachea midline. No palpable masses.  Thyroid: Normal to palpation.  Lymph:  No palpable LN in the neck.  Salivary glands:  Normal size, non-tender.  Communication: Voice normal.    Eyes: PERRLA and EOMI. Nystagmus No  Respiratory drive spontaneous and comfortable.  Cardiac rhythm regular.  Neuro: normal.  Mood/Psych: normal.    A/P  56 year old female with left temporal lesion, most likely a sebaceous cyst, for 2 mo. Recommend daily cleaning the area with soap and warm compress. If it persists, may consider excision. Discussed with pt regarding resulting facial scar.     This record was generated using voice recognition software. I proof-read and corrected any voice recognitions errors found. Please excuse any remaining voice recognition errors which were not detected.

## 2017-04-12 ENCOUNTER — Ambulatory Visit (HOSPITAL_BASED_OUTPATIENT_CLINIC_OR_DEPARTMENT_OTHER): Payer: No Typology Code available for payment source

## 2017-04-13 ENCOUNTER — Ambulatory Visit (HOSPITAL_BASED_OUTPATIENT_CLINIC_OR_DEPARTMENT_OTHER): Payer: No Typology Code available for payment source | Admitting: Family Medicine

## 2017-04-18 ENCOUNTER — Telehealth (HOSPITAL_BASED_OUTPATIENT_CLINIC_OR_DEPARTMENT_OTHER): Payer: Self-pay

## 2017-04-18 NOTE — Progress Notes (Signed)
Ortho appt booked at [facility] on 8/27 with Dr. Kennyth Lose. Called pt with appt info - no answer, left voicemail.

## 2017-04-22 ENCOUNTER — Other Ambulatory Visit (HOSPITAL_BASED_OUTPATIENT_CLINIC_OR_DEPARTMENT_OTHER): Payer: Self-pay | Admitting: Registered Nurse

## 2017-04-22 ENCOUNTER — Telehealth (HOSPITAL_BASED_OUTPATIENT_CLINIC_OR_DEPARTMENT_OTHER): Payer: Self-pay | Admitting: Registered Nurse

## 2017-04-22 DIAGNOSIS — M25511 Pain in right shoulder: Principal | ICD-10-CM

## 2017-04-25 ENCOUNTER — Ambulatory Visit (HOSPITAL_BASED_OUTPATIENT_CLINIC_OR_DEPARTMENT_OTHER): Payer: No Typology Code available for payment source | Admitting: Orthopaedic Surgery

## 2017-04-25 VITALS — BP 127/79 | HR 94

## 2017-04-25 DIAGNOSIS — M25512 Pain in left shoulder: Secondary | ICD-10-CM

## 2017-04-25 LAB — XR SHOULDER LEFT MINIMUM 2 VIEWS

## 2017-04-25 NOTE — Progress Notes (Signed)
Orthopedic Office Note    CC: No chief complaint on file.      ORTHOPEDIC PROBLEM LIST:  1. Left arm pain     HPI: Stephanie [name] is a 56 year old female with a history of left arm pain that began back in June without a specific injury. Patient describes the pain as "burning and sharp" that can sometimes radiate to her left pectoralis region.  Pain is at its worse with shoulder movements out to her side.  Was seen by her PCP who worked her up for a cardiac origin. All labs and procedures showed that  was not the case.  initially her PCP thought he may be herpes zoster.  They put her on an antiviral and symptoms continued to persist and she never broke out in a rash.  PCP then put her on Mobic in early August which she reports she gets relief from.  Denies distal paresthesias, numbness or tingling.  Denies neck pain.  Denies past injury to the shoulder.  She is RHD.     ROS: Review of systems filled out by patient and scanned into the medical record. Reviewed by me and all other systems are negative except as noted in HPI.    PMH: Past Medical History:  No date: Depression  11/21/2012: Hyperopia with astigmatism and presbyopia  11/21/2012: Immature cataract  08/13/2016: Squamous blepharitis of upper and lower eyelid*    Surgical HX: Past Surgical History:  No date: BREAST ENHANCEMENT SURGERY      Comment: 12/12 had bilateral mammoplasty and saline                implants, had 2nd surgery 5/30 for revision  No date: OB ANTEPARTUM CARE CESAREAN DLVR & POSTPARTUM      Comment: x3  No date: TOTAL ABDOMINAL HYSTERECT W/WO RMVL TUBE OVARY      Comment: for fibroids, still has cervix    SH:   Social History  Social History   Marital status: Divorced  Spouse name: N/A    Years of education: N/A  Number of children: N/A     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; 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     Social History Main Topics   Smoking status: Former Smoker     Types: Cigarettes    Quit date: 07/27/2001    Smokeless tobacco: Never Used     Alcohol use Yes  10.0 oz/week     Comment: occasional, weekends    Drug use: No    Sexual activity: Not Currently    Partners: Male    Comment: G3P3, no hx STD, no hx abn Pap     Other Topics Concern   None on file     Social History Narrative   None on file       Allergies: Review of Patient's Allergies indicates:  No Known Allergies    Current Medications:   Current Outpatient Prescriptions:     phentermine (ADIPEX-P) 37.5 MG tablet, Take 1 tablet by mouth every morning before breakfast, Disp: 28 tablet, Rfl: 0    hydrochlorothiazide (HYDRODIURIL) 25 MG tablet, Take 1 tablet by mouth daily, Disp: 30 tablet, Rfl: 5    omeprazole (PRILOSEC) 20 MG capsule, Take 1 capsule by mouth daily, Disp: 30 capsule, Rfl: 6    traZODone (DESYREL) 50 MG tablet, Take 1 tablet by mouth nightly, Disp: 30 tablet, Rfl: 11    FLUoxetine (PROZAC) 20 MG capsule, Take 1 capsule by mouth daily, Disp: 30 capsule,  Rfl: 2    Omega 3 1000 MG CAPS, Take 1 tablet by mouth daily, Disp: 90 capsule, Rfl: 3    erythromycin (ROMYCIN) ophthalmic ointment, Apply to both eyes, every evening, Disp: 3.5 g, Rfl: 12    meloxicam (MOBIC) 7.5 MG tablet, Take 1 tablet by mouth daily, Disp: , Rfl:     IMAGING: x-ray of the left shoulder shows moderate AC joint degenerative changes.  Otherwise, normal study.  Imaging was reviewed by myself and Dr. Kennyth Lose during this visit.     PHYSICAL EXAM:  GENERAL: Alert and oriented, in no acute distress  MOOD: Appropriate.   MUSCULOSKELETAL:   Left shoulder:  - No obvious swelling, erythema, ecchymosis.  No obvious deformity  - ROM normal FE with minimal discomfort.  Maximal pain with passive and active shoulder ABD.    - Localized increased hypersensitivity over the anterior and posterior axillary region without radiation upon palpation.  No palpated  deformities.  Does not have the same sensation upon palpation over the pec major/minor region, just discomfort.   - NV intact distally, strong 2+ radial  pulse.    ASSESSMENT/PLAN: 56 year old female here with atraumatic right shoulder pain with increased hypersensitivity.  Pain described as "sharp and burning".  Based upon clinical presentation and history of pain and how it is so localized, the concern is neurological involvement.  Patient would benefit for further evaluation by physiatry for an EMG study and possible management for nerve pain.   This appears to be only sensory involvement and motor does not seem to be disrupted.  Patient is in agreement to plan.  She will f/u here as needed.     Over 30 minutes was spent with the patient during the encounter, with more than 50% of time spent on counseling regarding condition and proper management.    We reviewed the likely diagnosis, the prognosis, and various treatment options in detail. Risks and benefits of treatment plan discussed. The patient's questions have been answered, and the patient understands agrees with treatment plan.     Dr. Kennyth Lose saw and examined the patient and formulated the assessment and plan.      Roderic Scarce Jimmye Norman, PA-C, 04/25/2017, 3:08 PM

## 2017-05-17 ENCOUNTER — Other Ambulatory Visit (HOSPITAL_BASED_OUTPATIENT_CLINIC_OR_DEPARTMENT_OTHER): Payer: Self-pay | Admitting: Family Medicine

## 2017-05-17 DIAGNOSIS — Z6832 Body mass index (BMI) 32.0-32.9, adult: Principal | ICD-10-CM

## 2017-05-17 DIAGNOSIS — E6609 Other obesity due to excess calories: Principal | ICD-10-CM

## 2017-05-17 NOTE — Progress Notes (Unsigned)
PER Pharmacy, Stephanie [name] is a 56 year old female has requested a refill of phentermine 37.5 mg.      Last Office Visit: 04/08/17 with Lucile Crater  Last Physical Exam: 02/21/13      Other Med Adult:  Most Recent BP Reading(s)  04/25/17 : 127/79        Cholesterol (mg/dL)   Date Value   02/06/2016 210     LOW DENSITY LIPOPROTEIN DIRECT (mg/dL)   Date Value   02/06/2016 112     HIGH DENSITY LIPOPROTEIN (mg/dL)   Date Value   02/06/2016 71     TRIGLYCERIDES (mg/dl)   Date Value   01/20/2009 88         THYROID SCREEN TSH REFLEX FT4 (uIU/mL)   Date Value   02/06/2016 0.785         TSH (THYROID STIM HORMONE) (uIU/mL)   Date Value   12/10/2016 0.790       HEMOGLOBIN A1C (%)   Date Value   03/06/2015 5.6       No results found for: POCA1C      INR (no units)   Date Value   04/22/2008 1.0 (L)   02/07/2005 1.0 (L)       SODIUM (mmol/L)   Date Value   12/10/2016 137       POTASSIUM (mmol/L)   Date Value   12/10/2016 4.1           CREATININE (mg/dL)   Date Value   12/10/2016 0.7       Documented patient preferred pharmacies:    [place], [place], [place] [facility] [address]  Phone: [phone] Fax: [phone]

## 2017-05-27 ENCOUNTER — Ambulatory Visit (HOSPITAL_BASED_OUTPATIENT_CLINIC_OR_DEPARTMENT_OTHER): Payer: No Typology Code available for payment source | Admitting: Physical Medicine & Rehabilitation

## 2017-05-31 ENCOUNTER — Other Ambulatory Visit (HOSPITAL_BASED_OUTPATIENT_CLINIC_OR_DEPARTMENT_OTHER): Payer: Self-pay

## 2017-05-31 NOTE — Progress Notes (Signed)
SW reviewing chart for quarterly mental health treatment plan  Pt missed last session with SW and APRN in August.  SW will ask PMH admin to outreach.

## 2017-06-28 ENCOUNTER — Other Ambulatory Visit (HOSPITAL_BASED_OUTPATIENT_CLINIC_OR_DEPARTMENT_OTHER): Payer: Self-pay | Admitting: Family Medicine

## 2017-06-28 DIAGNOSIS — E6609 Other obesity due to excess calories: Principal | ICD-10-CM

## 2017-06-28 DIAGNOSIS — F341 Dysthymic disorder: Principal | ICD-10-CM

## 2017-06-28 DIAGNOSIS — F439 Reaction to severe stress, unspecified: Secondary | ICD-10-CM

## 2017-06-28 DIAGNOSIS — F418 Other specified anxiety disorders: Secondary | ICD-10-CM

## 2017-06-28 DIAGNOSIS — F339 Major depressive disorder, recurrent, unspecified: Secondary | ICD-10-CM

## 2017-06-28 DIAGNOSIS — Z6832 Body mass index (BMI) 32.0-32.9, adult: Principal | ICD-10-CM

## 2017-06-28 MED FILL — HYDROCHLOROT 25MG: 30 days supply | Qty: 30 | Fill #0 | Status: CP

## 2017-06-28 MED FILL — *OMEPRAZOLE   20MG: 30 days supply | Qty: 30 | Fill #0 | Status: CP

## 2017-06-28 NOTE — Progress Notes (Signed)
PER Pharmacy, Stephanie [name] is a 56 year old female has requested a refill of fluoxetine 20    Last Office Visit: 04-08-17 with [name]  Last Physical Exam: 02-21-13    Other Med Adult:  Most Recent BP Reading(s)  04/25/17 : 127/79        Cholesterol (mg/dL)   Date Value   02/06/2016 210     LOW DENSITY LIPOPROTEIN DIRECT (mg/dL)   Date Value   02/06/2016 112     HIGH DENSITY LIPOPROTEIN (mg/dL)   Date Value   02/06/2016 71     TRIGLYCERIDES (mg/dl)   Date Value   01/20/2009 88         THYROID SCREEN TSH REFLEX FT4 (uIU/mL)   Date Value   02/06/2016 0.785         TSH (THYROID STIM HORMONE) (uIU/mL)   Date Value   12/10/2016 0.790       HEMOGLOBIN A1C (%)   Date Value   03/06/2015 5.6       No results found for: POCA1C      INR (no units)   Date Value   04/22/2008 1.0 (L)   02/07/2005 1.0 (L)       SODIUM (mmol/L)   Date Value   12/10/2016 137       POTASSIUM (mmol/L)   Date Value   12/10/2016 4.1           CREATININE (mg/dL)   Date Value   12/10/2016 0.7       Documented patient preferred pharmacies:    [facility], [place], [place] - [place]  Phone: [phone] Fax: [phone]

## 2017-06-28 NOTE — Progress Notes (Signed)
PER Pharmacy, Stephanie [name] is a 56 year old female has requested a refill of      - phentermine 37.5  - start date: 05-18-17 end date: 04-19-18     - Please fax the prescription to [name]:        Last Office Visit: 04-08-17 with [name]  Last Physical Exam: [phone]      Other Med Adult:  Most Recent BP Reading(s)  04/25/17 : 127/79        Cholesterol (mg/dL)   Date Value   02/06/2016 210     LOW DENSITY LIPOPROTEIN DIRECT (mg/dL)   Date Value   02/06/2016 112     HIGH DENSITY LIPOPROTEIN (mg/dL)   Date Value   02/06/2016 71     TRIGLYCERIDES (mg/dl)   Date Value   01/20/2009 88         THYROID SCREEN TSH REFLEX FT4 (uIU/mL)   Date Value   02/06/2016 0.785         TSH (THYROID STIM HORMONE) (uIU/mL)   Date Value   12/10/2016 0.790       HEMOGLOBIN A1C (%)   Date Value   03/06/2015 5.6       No results found for: POCA1C      INR (no units)   Date Value   04/22/2008 1.0 (L)   02/07/2005 1.0 (L)       SODIUM (mmol/L)   Date Value   12/10/2016 137       POTASSIUM (mmol/L)   Date Value   12/10/2016 4.1           CREATININE (mg/dL)   Date Value   12/10/2016 0.7       Documented patient preferred pharmacies:    [facility], [place], [place] - [place]  Phone: [phone] Fax: [phone]

## 2017-06-29 ENCOUNTER — Telehealth (HOSPITAL_BASED_OUTPATIENT_CLINIC_OR_DEPARTMENT_OTHER): Payer: Self-pay | Admitting: Clinic/Center

## 2017-06-29 NOTE — Progress Notes (Signed)
Call via [place] telephone interpreter  Unable to reach the patient   Message left for the patient to call the clinc to schedule f/u appointment  Prior to any further refills of  Fluoxetine     Rx sent to [facility]     From: [facility]: 06/29/2017   4:22 PM  To: Nancy Fetter Rn's    Can we get her an apt for next month please? Will send med for this month.

## 2017-07-25 ENCOUNTER — Telehealth (HOSPITAL_BASED_OUTPATIENT_CLINIC_OR_DEPARTMENT_OTHER): Payer: Self-pay

## 2017-07-25 NOTE — Progress Notes (Signed)
Stephanie [name], 07/25/2017, 1:27 PM  Writer called pt to outreach for depression and do a PHQ-9, no answer, left voicemail.

## 2017-08-12 ENCOUNTER — Other Ambulatory Visit (HOSPITAL_BASED_OUTPATIENT_CLINIC_OR_DEPARTMENT_OTHER): Payer: Self-pay | Admitting: Family Medicine

## 2017-08-12 DIAGNOSIS — F418 Other specified anxiety disorders: Secondary | ICD-10-CM

## 2017-08-12 DIAGNOSIS — F439 Reaction to severe stress, unspecified: Secondary | ICD-10-CM

## 2017-08-12 DIAGNOSIS — F339 Major depressive disorder, recurrent, unspecified: Secondary | ICD-10-CM

## 2017-08-12 DIAGNOSIS — F341 Dysthymic disorder: Principal | ICD-10-CM

## 2017-08-12 MED FILL — HYDROCHLOROT 25MG: 30 days supply | Qty: 30 | Fill #1 | Status: CP

## 2017-09-16 ENCOUNTER — Encounter (HOSPITAL_BASED_OUTPATIENT_CLINIC_OR_DEPARTMENT_OTHER): Payer: Self-pay | Admitting: Family Medicine

## 2017-09-16 ENCOUNTER — Encounter (HOSPITAL_BASED_OUTPATIENT_CLINIC_OR_DEPARTMENT_OTHER): Payer: Self-pay

## 2017-09-16 ENCOUNTER — Ambulatory Visit (HOSPITAL_BASED_OUTPATIENT_CLINIC_OR_DEPARTMENT_OTHER): Payer: No Typology Code available for payment source | Admitting: Family Medicine

## 2017-09-16 VITALS — BP 120/78 | HR 78 | Temp 98.4°F | Wt 148.4 lb

## 2017-09-16 DIAGNOSIS — R200 Anesthesia of skin: Secondary | ICD-10-CM

## 2017-09-16 DIAGNOSIS — F341 Dysthymic disorder: Secondary | ICD-10-CM

## 2017-09-16 DIAGNOSIS — K2100 Gastro-esophageal reflux disease with esophagitis, without bleeding: Secondary | ICD-10-CM

## 2017-09-16 DIAGNOSIS — F1010 Alcohol abuse, uncomplicated: Secondary | ICD-10-CM

## 2017-09-16 DIAGNOSIS — Z23 Encounter for immunization: Secondary | ICD-10-CM

## 2017-09-16 DIAGNOSIS — M25562 Pain in left knee: Secondary | ICD-10-CM

## 2017-09-16 DIAGNOSIS — I10 Essential (primary) hypertension: Secondary | ICD-10-CM

## 2017-09-16 DIAGNOSIS — Z6832 Body mass index (BMI) 32.0-32.9, adult: Principal | ICD-10-CM

## 2017-09-16 DIAGNOSIS — F439 Reaction to severe stress, unspecified: Secondary | ICD-10-CM

## 2017-09-16 DIAGNOSIS — F418 Other specified anxiety disorders: Secondary | ICD-10-CM

## 2017-09-16 DIAGNOSIS — Z800 Family history of malignant neoplasm of digestive organs: Secondary | ICD-10-CM

## 2017-09-16 DIAGNOSIS — E6609 Other obesity due to excess calories: Principal | ICD-10-CM

## 2017-09-16 DIAGNOSIS — R7309 Other abnormal glucose: Secondary | ICD-10-CM

## 2017-09-16 DIAGNOSIS — F339 Major depressive disorder, recurrent, unspecified: Secondary | ICD-10-CM

## 2017-09-16 DIAGNOSIS — R202 Paresthesia of skin: Secondary | ICD-10-CM

## 2017-09-16 MED ORDER — OMEPRAZOLE 20 MG PO CPDR
20.0000 mg | DELAYED_RELEASE_CAPSULE | Freq: Every day | ORAL | 11 refills | Status: DC
Start: 2017-09-16 — End: 2018-10-09

## 2017-09-16 MED ORDER — HYDROCHLOROTHIAZIDE 25 MG PO TABS: 25 mg | tablet | Freq: Every day | ORAL | 11 refills | 0 days | Status: AC

## 2017-09-16 MED ORDER — FLUOXETINE HCL 20 MG PO CAPS
20.0000 mg | ORAL_CAPSULE | Freq: Every day | ORAL | 2 refills | Status: DC
Start: 2017-09-16 — End: 2018-03-07

## 2017-09-16 MED ORDER — FLUOXETINE HCL 20 MG PO CAPS: 20 mg | capsule | Freq: Every day | ORAL | 2 refills | 0 days | Status: DC

## 2017-09-16 MED ORDER — HYDROCHLOROTHIAZIDE 25 MG PO TABS
25.0000 mg | ORAL_TABLET | Freq: Every day | ORAL | 11 refills | Status: DC
Start: 2017-09-16 — End: 2018-10-16

## 2017-09-16 MED ORDER — OMEPRAZOLE 20 MG PO CPDR: 20 mg | capsule | Freq: Every day | ORAL | 11 refills | 0 days | Status: AC

## 2017-09-16 MED ORDER — PHENTERMINE HCL 37.5 MG PO TABS
37.5000 mg | ORAL_TABLET | Freq: Every morning | ORAL | 0 refills | Status: DC
Start: 2017-09-16 — End: 2017-12-21

## 2017-09-16 MED ORDER — PHENTERMINE HCL 37.5 MG PO TABS: 38 mg | tablet | Freq: Every morning | ORAL | 0 refills | 0 days | Status: DC

## 2017-09-16 MED FILL — PHENTERMINE 37.5MG TAB: 28 days supply | Qty: 28 | Fill #0 | Status: CP

## 2017-09-16 MED FILL — HYDROCHLOROT 25MG: 30 days supply | Qty: 30 | Fill #0 | Status: CP

## 2017-09-16 MED FILL — *OMEPRAZOLE   20MG: 30 days supply | Qty: 30 | Fill #0 | Status: CP

## 2017-09-16 MED FILL — FLUOXETINE   20MG: 30 days supply | Qty: 30 | Fill #0 | Status: CP

## 2017-09-16 NOTE — Patient Instructions (Signed)
Physiatry [phone]

## 2017-09-16 NOTE — Addendum Note (Signed)
Addended by: Bertram Savin on: 09/16/2017 09:44 AM     Modules accepted: Orders, SmartSet

## 2017-09-16 NOTE — Progress Notes (Signed)
BP 120/78  Pulse 78  Temp 98.4 F (36.9 C) (Temporal)  Wt 67.3 kg (148 lb 6.4 oz)  LMP 07/11/2005  SpO2 99%  BMI 29.52 kg/m2    S:    Stephanie [name] is a 57 year old female who presents with:    Knee pain     Not seeing therapist    Numbness on the L side  Arm   Constant   House cleaning   Like the arm pain but was told was specialist    meloxicam  So much pain at night    Most Recent Weight Reading(s)  09/16/17 : 67.3 kg (148 lb 6.4 oz)  04/08/17 : 61.7 kg (136 lb)  03/24/17 : 62.1 kg (137 lb)  02/25/17 : 62.2 kg (137 lb 3.2 oz)  12/10/16 : 62.7 kg (138 lb 3.2 oz)      Past Medical History:   Diagnosis Date    Depression     Hyperopia with astigmatism and presbyopia 11/21/2012    Immature cataract 11/21/2012    Squamous 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 eyes 08/13/2016       Family History   Problem Relation Age of Onset    Cancer - Other Father         stomach    Heart Brother         died at 36 during valve replacement    Heart Brother         chestpain with neg w/u    Heart Mother         Died of MI age 1        Social History    Tobacco Use      Smoking status: Former Smoker        Types: Cigarettes        Quit date: 07/27/2001        Years since quitting: 16.1      Smokeless tobacco: Never Used    Alcohol use: Yes      Alcohol/week: 10.0 oz      Comment: occasional, weekends    Drug use: No      ROS: Per HPI. Denies headaches, weakness/falls, chest pain, shortness of breath, abdominal pain/nausea/vomiting, numbness/tingling, fevers or chills.     Medications, allergies, PMH and SH reviewed and updated as necessary in Epic.    O:  PHYSICAL EXAM:  BP 120/78  Pulse 78  Temp 98.4 F (36.9 C) (Temporal)  Wt 67.3 kg (148 lb 6.4 oz)  LMP 07/11/2005  SpO2 99%  BMI 29.52 kg/m2  GEN: NAD  HEENT: MMM  SKIN: WWP  CHEST: Breathing comfortably  NEURO: No focal deficits noted, alert  MSK: Normal gait observed  PSYCH: Appears stated age, appropriate grooming and dress, appropriate affect and mood  congruent    A/P:  Stephanie [name] is a 57 year old female presents with:    1. Depression, recurrent (Stephanie [name])  Revieweing if can see therapy  - FLUoxetine (PROZAC) 20 MG capsule; Take 1 capsule by mouth daily  Dispense: 30 capsule; Refill: 2    2. Class 1 obesity due to excess calories without serious comorbidity with body mass index (BMI) of 32.0 to 32.9 in adult  Restart meds  - phentermine (ADIPEX-P) 37.5 MG tablet; Take 1 tablet by mouth every morning before breakfast  Dispense: 28 tablet; Refill: 0    3. Elevated hemoglobin A1c  resolved    4. Family history of malignant neoplasm of gastrointestinal  tract  stomach    5. Alcohol abuse  improved    6. Persistent depressive disorder  - FLUoxetine (PROZAC) 20 MG capsule; Take 1 capsule by mouth daily  Dispense: 30 capsule; Refill: 2    7. Other specified anxiety disorders  - FLUoxetine (PROZAC) 20 MG capsule; Take 1 capsule by mouth daily  Dispense: 30 capsule; Refill: 2    8. Trauma and stressor-related disorder  - FLUoxetine (PROZAC) 20 MG capsule; Take 1 capsule by mouth daily  Dispense: 30 capsule; Refill: 2    9. Numbness and tingling in left arm  Reviewed ortho last note - was to get EMG will sent  - REFERRAL TO PHYSIATRY ( INT)    10. Acute pain of left knee  Likely OA last xray in 2011 - likely oa - swelling intermittently - will get xray and offer inj next visit  - XR KNEE LEFT 3 VW; Future      We discussed the patients current medications. The patient expressed understanding and no barriers to adherence were identified.    Health Maintenance:  Reviewed HM    Stephanie [name] R. Katy Apo, MD

## 2017-09-16 NOTE — Progress Notes (Signed)
Influenza Vaccine Procedure  September 16, 2017    1. Has the patient received the information for the influenza vaccine?     2. Does the patient have any of the following contraindications?  Allergy to eggs?   Allergic reaction to previous influenza vaccines?   Any other problems to previous influenza vaccines?   Paralyzed by Guillain-Barre syndrome?    Current moderate or severe illness?   Allergy to contact lens solution?     3. The vaccine has been administered in the usual fashion.     Immunization information reviewed. Current VIS reviewed and given to patient/ guardian. Verbal assent obtained from patient/ guardian.  See immunization/Injection module or chart review for date of publication and additional information. Verbal assent obtained from patient/guardian. Comfort measures for possible side effects reviewed.

## 2017-09-20 ENCOUNTER — Telehealth (HOSPITAL_BASED_OUTPATIENT_CLINIC_OR_DEPARTMENT_OTHER): Payer: Self-pay

## 2017-09-20 NOTE — Progress Notes (Signed)
SW received the following note from PCP Dr. Orie Fisherman:    Pt loved you but can't see you because of insurance - ? That seems odd. Do you think she would benefit from more time with you and if so how can we make that happen? Thanks!     SW called and spoke w pt.  Per chart, pt has partial free care.  Pt does not understand how it works, if she pays co-insurance up front or if she pays a portion of each visit.  She will go to [employer] at [facility] and speak w someone.  Depending on what her coverage is, she will return to individual therapy w this SW.

## 2017-09-21 ENCOUNTER — Encounter (HOSPITAL_BASED_OUTPATIENT_CLINIC_OR_DEPARTMENT_OTHER): Payer: Self-pay

## 2017-09-21 ENCOUNTER — Emergency Department (HOSPITAL_BASED_OUTPATIENT_CLINIC_OR_DEPARTMENT_OTHER)
Admission: RE | Admit: 2017-09-21 | Disposition: A | Discharge status: Home / Self Care | Payer: Self-pay | Source: Emergency Department | Attending: Emergency Medicine | Admitting: Emergency Medicine

## 2017-09-21 ENCOUNTER — Ambulatory Visit (HOSPITAL_BASED_OUTPATIENT_CLINIC_OR_DEPARTMENT_OTHER): Payer: Self-pay | Admitting: Family Medicine

## 2017-09-21 DIAGNOSIS — M1712 Unilateral primary osteoarthritis, left knee: Principal | ICD-10-CM

## 2017-09-21 LAB — CBC, PLATELET & DIFFERENTIAL
ABSOLUTE BASO COUNT: 0 10*3/uL (ref 0.0–0.1)
ABSOLUTE EOSINOPHIL COUNT: 0.1 10*3/uL (ref 0.0–0.8)
ABSOLUTE IMM GRAN COUNT: 0.01 10*3/uL (ref 0.00–0.03)
ABSOLUTE LYMPH COUNT: 3 10*3/uL (ref 0.6–5.9)
ABSOLUTE MONO COUNT: 0.6 10*3/uL (ref 0.2–1.4)
ABSOLUTE NEUTROPHIL COUNT: 4.2 10*3/uL (ref 1.6–8.3)
BASOPHIL %: 0.3 % (ref 0.0–1.2)
EOSINOPHIL %: 1.2 % (ref 0.0–7.0)
HEMATOCRIT: 39.8 % (ref 34.1–44.9)
HEMOGLOBIN: 13.6 g/dL (ref 11.2–15.7)
IMMATURE GRANULOCYTE %: 0.1 % (ref 0.0–0.4)
LYMPHOCYTE %: 38 % (ref 15.0–54.0)
MEAN CORP HGB CONC: 34.2 g/dL (ref 31.0–37.0)
MEAN CORPUSCULAR HGB: 31.8 pg (ref 26.0–34.0)
MEAN CORPUSCULAR VOL: 93 fL (ref 80.0–100.0)
MEAN PLATELET VOLUME: 10.7 fL (ref 8.7–12.5)
MONOCYTE %: 7.3 % (ref 4.0–13.0)
NEUTROPHIL %: 53.1 % (ref 40.0–75.0)
PLATELET COUNT: 276 10*3/uL (ref 150–400)
RBC DISTRIBUTION WIDTH STD DEV: 46.6 fL — ABNORMAL HIGH (ref 35.1–46.3)
RBC DISTRIBUTION WIDTH: 13.6 % (ref 11.5–14.3)
RED BLOOD CELL COUNT: 4.28 M/uL (ref 3.90–5.20)
WHITE BLOOD CELL COUNT: 7.8 10*3/uL (ref 4.0–11.0)

## 2017-09-21 LAB — MAGNESIUM: MAGNESIUM: 2.4 mg/dL (ref 1.8–2.4)

## 2017-09-21 LAB — BASIC METABOLIC PANEL
ANION GAP: 7 mmol/L (ref 5–15)
BUN (UREA NITROGEN): 13 mg/dL (ref 7–18)
CALCIUM: 9.1 mg/dL (ref 8.5–10.1)
CARBON DIOXIDE: 27 mmol/L (ref 21–32)
CHLORIDE: 101 mmol/L (ref 98–107)
CREATININE: 0.7 mg/dL (ref 0.4–1.2)
ESTIMATED GLOMERULAR FILT RATE: >60 mL/min (ref >60)
Glucose Random: 114 mg/dL (ref 74–160)
POTASSIUM: 3.9 mmol/L (ref 3.5–5.1)
SODIUM: 135 mmol/L — ABNORMAL LOW (ref 136–145)

## 2017-09-21 LAB — BLOOD SUGAR FINGERSTICK (POINT OF CARE): FINGERSTICK GLUCOSE: 113 mg/dl (ref 74–160)

## 2017-09-21 LAB — EKG

## 2017-09-21 LAB — XR KNEE LEFT 3 VIEWS

## 2017-09-21 MED ORDER — ACETAMINOPHEN 325 MG PO TABS
975.0000 mg | ORAL_TABLET | Freq: Once | ORAL | Status: AC
Start: 2017-09-21 — End: 2017-09-21
  Administered 2017-09-21: 975 mg via ORAL
  Filled 2017-09-21: qty 3

## 2017-09-21 NOTE — Narrator Note (Addendum)
Resting, states she is feeling better and not dizzy
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
## 2017-09-21 NOTE — Narrator Note (Signed)
Ambulated to bathroom. Denies feeling dizzy.

## 2017-09-21 NOTE — Narrator Note (Signed)
c/o HA

## 2017-09-21 NOTE — Narrator Note (Signed)
Patient is resting comfortably. Offers no c/o

## 2017-09-21 NOTE — Miscellaneous (Signed)
Rapid response called on pt--on arrival pt speaking comf full sentences--O2 sat 97% rm air --MD did not feel resp needed at this time--will follow if needed

## 2017-09-21 NOTE — ED Triage Note (Signed)
Pt arrived to ED via stretcher from radiology after rapid response for near syncopal episode that occurred with position change during xray of knee. EKG,labs obtained placed on cardiac monitor, iv established. C/o headache, denies fall/ head strike. States that she has not taken her bp meds for 3 days as she had run out. First dose was last night. Has had previous episodes of dizziness in past, dizziness increases with position change.

## 2017-09-21 NOTE — Addendum Note (Signed)
Addended byTawanna Solo on: 09/21/2017 07:55 AM     Modules accepted: Orders

## 2017-09-23 ENCOUNTER — Ambulatory Visit (HOSPITAL_BASED_OUTPATIENT_CLINIC_OR_DEPARTMENT_OTHER): Payer: No Typology Code available for payment source | Admitting: Physical Medicine & Rehabilitation

## 2017-09-23 DIAGNOSIS — G5602 Carpal tunnel syndrome, left upper limb: Principal | ICD-10-CM

## 2017-09-23 NOTE — Progress Notes (Signed)
The patient was referred from your primary care provider's office, Dr. Orie Fisherman, for electrodiagnostic evaluation of the left upper limb, but scheduled for the regular visit.    Patient complains of left hand numbness persisted for at least a year, interfered with sleep, aggravated by work activity (housecleaning).    On examination there is no obvious hand muscle intrinsics hypertrophy.  Although patient report numbness in all fingers, sensation is intact.  Tinel, Phalen signs are negative.    Electrodiagnostic test was explained to the patient with the help of face-to-face [place] interpreter and we agreed to proceed.    Nerve conduction study data are stored and obtainable from the EMG folder on the [address].    I tested with concentric needle electrode the following muscles- deltoid, biceps, triceps, brachioradialis, flexor carpi ulnaris, flexor carpi radialis, FDI, APB.    Results: Motor study revealed within normal, but as twice is less decrease of left median amplitude to compare with the right, borderline latency and conduction velocity.  Sensory study revealed prolongation of median peak latency, preserved amplitude, decrease of conduction velocity.    Electromyography revealed +1 fibrillation potentials, testing APB, mild reinnervation features -mild decrease of recruitment, increased proportion of multiphasic re-innervated potentials.    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  There is no localized cadence of cervical radiculopathy, brachial plexopathy, other isolated mononeuropathy (except as above) or peripheral polyneuropathy.     The note was prepared using voice recognition software.   Please disregard any transcription errors.

## 2017-10-12 ENCOUNTER — Telehealth (HOSPITAL_BASED_OUTPATIENT_CLINIC_OR_DEPARTMENT_OTHER): Payer: Self-pay | Admitting: Registered Nurse

## 2017-10-13 NOTE — Progress Notes (Signed)
Called with telephone interpreter regarding below no answer phone rings and rings and no VM is prompt not able to leave message.     She does have some OA - more medial - she could have a joint injection with [place] or with ortho if she wants to try that     Exam Date: 09/21/17  Exam Status: Signed      Exam: KNEE, LEFT 3 VIEWS ONLY    Reason for Exam: ro oa        Exam: Left knee, 3 views      Indication: ro oa      Comparison: August 2018      Findings:      Bones: There is no fracture or suspicious bone lesion. There is minor    spur production      Joints: There is narrowing of the medial compartment. No joint    effusion is seen.      Soft Tissues: Unremarkable.       Impression: Osteoarthritis with predilection for the medial    compartment

## 2017-10-19 MED FILL — HYDROCHLOROT 25MG: 30 days supply | Qty: 30 | Fill #1 | Status: CP

## 2017-10-26 NOTE — Progress Notes (Signed)
2nd attempt no answer left voicemail to return call to this nurse,

## 2017-10-26 NOTE — Progress Notes (Signed)
Pt returned call with phone interpreter advised OA in left knee. Dr. Katy Apo advised Inj. Pt would like inj scheduled appt

## 2017-11-02 ENCOUNTER — Ambulatory Visit (HOSPITAL_BASED_OUTPATIENT_CLINIC_OR_DEPARTMENT_OTHER): Payer: Self-pay | Admitting: Family Medicine

## 2017-11-07 ENCOUNTER — Encounter (HOSPITAL_BASED_OUTPATIENT_CLINIC_OR_DEPARTMENT_OTHER): Payer: Self-pay | Admitting: Family Medicine

## 2017-11-07 ENCOUNTER — Ambulatory Visit (HOSPITAL_BASED_OUTPATIENT_CLINIC_OR_DEPARTMENT_OTHER): Payer: No Typology Code available for payment source | Admitting: Family Medicine

## 2017-11-07 VITALS — BP 126/86 | HR 95 | Temp 98.4°F | Wt 150.8 lb

## 2017-11-07 DIAGNOSIS — M1710 Unilateral primary osteoarthritis, unspecified knee: Principal | ICD-10-CM

## 2017-11-07 MED ORDER — TRIAMCINOLONE ACETONIDE 40 MG/ML IJ SUSP: 20 mg | mL | Freq: Once | INTRAMUSCULAR | 0 refills | 0 days | Status: AC

## 2017-11-07 MED ORDER — TRIAMCINOLONE ACETONIDE 40 MG/ML IJ SUSP
20.00 mg | Freq: Once | INTRAMUSCULAR | 0 refills | Status: AC
Start: 2017-11-07 — End: 2017-11-07

## 2017-11-07 NOTE — Progress Notes (Signed)
BP 126/86  Pulse 95  Temp 98.4 F (36.9 C) (Temporal)  Wt 68.4 kg (150 lb 12.8 oz)  LMP 07/11/2005  SpO2 98%  BMI 30 kg/m2    S:    Stephanie [name] is a 57 year old female who presents with:    Knee inj    Past Medical History:   Diagnosis Date    Depression     Elevated hemoglobin A1c 08/16/2010    HEMOGLOBIN A1C (%) Date Value 03/06/2015 5.6 05/20/2014 5.4 05/08/2012 6.0 (H)   No results found for: POCA1C     Hyperopia with astigmatism and presbyopia 11/21/2012    Immature cataract 11/21/2012    Squamous 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 eyes 08/13/2016       ROS: Per HPI. Denies headaches, weakness/falls, chest pain, shortness of breath, abdominal pain/nausea/vomiting, numbness/tingling, fevers or chills.     Medications, allergies, PMH and SH reviewed and updated as necessary in Epic.    O:  PHYSICAL EXAM:  BP 126/86  Pulse 95  Temp 98.4 F (36.9 C) (Temporal)  Wt 68.4 kg (150 lb 12.8 oz)  LMP 07/11/2005  SpO2 98%  BMI 30 kg/m2  GEN: NAD  HEENT: MMM  SKIN: WWP  CHEST: Breathing comfortably  NEURO: No focal deficits noted, alert  MSK: Normal gait observed  PSYCH: Appears stated age, appropriate grooming and dress, appropriate affect and mood congruent    A/P:  Stephanie [name] is a 57 year old female presents with:    (M17.10) Localized osteoarthritis of knee  (primary encounter diagnosis)  Plan: triamcinolone acetonide (KENALOG) 40 MG/ML         injection, TRIAMCINOLONE ACETONIDE INJ-PER 10MG         (KENALOG), PR ARTHROCENTESIS ASPIR&/INJ MAJOR         JT/BURSA W/O [place]        After verbal informed consent was obtained from the patient, the left knee was prepped in the usual sterile fashion.  The suprapatellar area was cleaned with alcohol.  A 20 gauge needle was then introduced after adequate anesthesia was achieved and a mixture of Kenalog 40mg /mL and 51mL of Lidocaine 1% without epinephrine was injected without difficulty.  The patient was given aftercare instructions.    F/u  and as needed    Health  Maintenance:  Reviewed HM    Diane Hanel R. Katy Apo, MD

## 2017-11-21 MED FILL — HYDROCHLOROT 25MG: 30 days supply | Qty: 30 | Fill #2 | Status: CP

## 2017-11-21 MED FILL — *OMEPRAZOLE   20MG: 30 days supply | Qty: 30 | Fill #1 | Status: CP

## 2017-11-21 MED FILL — FLUOXETINE   20MG: 30 days supply | Qty: 30 | Fill #1 | Status: CP

## 2017-12-13 ENCOUNTER — Ambulatory Visit (HOSPITAL_BASED_OUTPATIENT_CLINIC_OR_DEPARTMENT_OTHER): Payer: No Typology Code available for payment source | Admitting: Hand Surgery

## 2017-12-21 ENCOUNTER — Other Ambulatory Visit (HOSPITAL_BASED_OUTPATIENT_CLINIC_OR_DEPARTMENT_OTHER): Payer: Self-pay | Admitting: Family Medicine

## 2017-12-21 DIAGNOSIS — E6609 Other obesity due to excess calories: Principal | ICD-10-CM

## 2017-12-21 DIAGNOSIS — Z6832 Body mass index (BMI) 32.0-32.9, adult: Principal | ICD-10-CM

## 2017-12-21 MED FILL — HYDROCHLOROT 25MG: 30 days supply | Qty: 30 | Fill #3 | Status: CP

## 2017-12-21 MED FILL — *OMEPRAZOLE   20MG: 30 days supply | Qty: 30 | Fill #2 | Status: CP

## 2017-12-21 MED FILL — PHENTERMINE 37.5MG TAB: 28 days supply | Qty: 28 | Fill #0 | Status: CP

## 2017-12-21 MED FILL — FLUOXETINE   20MG: 30 days supply | Qty: 30 | Fill #2 | Status: CP

## 2017-12-21 NOTE — Progress Notes (Signed)
PER Pharmacy,Ozell Pajak is a 57 year old female has requested a refill of           - Phentermine 37.5mg  - start date: 09/16/17 end date: 10/14/17          Last Office Visit: 3/11/19with Orie Fisherman  Last Physical Exam: 02/21/13      Documented patient preferred pharmacies:    [facility], [place]. [place]  Phone: [phone] Fax: [phone]

## 2018-01-30 MED FILL — HYDROCHLOROT 25MG: 30 days supply | Qty: 30 | Fill #4 | Status: CP

## 2018-03-07 ENCOUNTER — Other Ambulatory Visit (HOSPITAL_BASED_OUTPATIENT_CLINIC_OR_DEPARTMENT_OTHER): Payer: Self-pay | Admitting: Family Medicine

## 2018-03-07 DIAGNOSIS — F439 Reaction to severe stress, unspecified: Secondary | ICD-10-CM

## 2018-03-07 DIAGNOSIS — F418 Other specified anxiety disorders: Secondary | ICD-10-CM

## 2018-03-07 DIAGNOSIS — F341 Dysthymic disorder: Principal | ICD-10-CM

## 2018-03-07 DIAGNOSIS — F339 Major depressive disorder, recurrent, unspecified: Secondary | ICD-10-CM

## 2018-03-07 MED FILL — *OMEPRAZOLE   20MG: 30 days supply | Qty: 30 | Fill #3 | Status: CP

## 2018-03-07 MED FILL — HYDROCHLOROT 25MG: 30 days supply | Qty: 30 | Fill #5 | Status: CP

## 2018-03-08 NOTE — Progress Notes (Signed)
PER Pharmacy, Stephanie [name] is a 57 year old female has requested a refill of fluoxetine.      Last Office Visit: 11/07/2017 with [place].A  Last Physical Exam: 02/20/2013    There are no preventive care reminders to display for this patient.    Other Med Adult:  Most Recent BP Reading(s)  11/07/17 : 126/86        Cholesterol (mg/dL)   Date Value   02/06/2016 210     LOW DENSITY LIPOPROTEIN DIRECT (mg/dL)   Date Value   02/06/2016 112     HIGH DENSITY LIPOPROTEIN (mg/dL)   Date Value   02/06/2016 71     TRIGLYCERIDES (mg/dl)   Date Value   01/20/2009 88         THYROID SCREEN TSH REFLEX FT4 (uIU/mL)   Date Value   02/06/2016 0.785         TSH (THYROID STIM HORMONE) (uIU/mL)   Date Value   12/10/2016 0.790       HEMOGLOBIN A1C (%)   Date Value   03/06/2015 5.6       No results found for: POCA1C      INR (no units)   Date Value   04/22/2008 1.0 (L)   02/07/2005 1.0 (L)       SODIUM (mmol/L)   Date Value   09/21/2017 135 (L)       POTASSIUM (mmol/L)   Date Value   09/21/2017 3.9           CREATININE (mg/dL)   Date Value   09/21/2017 0.7       Documented patient preferred pharmacies:    [facility], [place] - [place]  Phone: [phone] Fax: [phone]

## 2018-03-10 MED FILL — FLUOXETINE   20MG: 30 days supply | Qty: 30 | Fill #0 | Status: CP

## 2018-04-11 MED FILL — HYDROCHLOROT 25MG: 30 days supply | Qty: 30 | Fill #6 | Status: CP

## 2018-04-17 ENCOUNTER — Other Ambulatory Visit (HOSPITAL_BASED_OUTPATIENT_CLINIC_OR_DEPARTMENT_OTHER): Payer: Self-pay | Admitting: Family Medicine

## 2018-04-17 DIAGNOSIS — E6609 Other obesity due to excess calories: Principal | ICD-10-CM

## 2018-04-17 DIAGNOSIS — Z6832 Body mass index (BMI) 32.0-32.9, adult: Principal | ICD-10-CM

## 2018-04-17 NOTE — Progress Notes (Signed)
PER Pharmacy, Stephanie [name] is a 57 year old female has requested a refill of Acipex     Last prescribed:   Start Date: 12/21/17 End Date: 01/18/18         Last Office Visit: 11/07/17 with Dr.[place]  Last Physical Exam: 02/21/13      Other Med Adult:  Most Recent BP Reading(s)  11/07/17 : 126/86        Cholesterol (mg/dL)   Date Value   02/06/2016 210     LOW DENSITY LIPOPROTEIN DIRECT (mg/dL)   Date Value   02/06/2016 112     HIGH DENSITY LIPOPROTEIN (mg/dL)   Date Value   02/06/2016 71     TRIGLYCERIDES (mg/dl)   Date Value   01/20/2009 88         THYROID SCREEN TSH REFLEX FT4 (uIU/mL)   Date Value   02/06/2016 0.785         TSH (THYROID STIM HORMONE) (uIU/mL)   Date Value   12/10/2016 0.790       HEMOGLOBIN A1C (%)   Date Value   03/06/2015 5.6       No results found for: POCA1C      INR (no units)   Date Value   04/22/2008 1.0 (L)   02/07/2005 1.0 (L)       SODIUM (mmol/L)   Date Value   09/21/2017 135 (L)       POTASSIUM (mmol/L)   Date Value   09/21/2017 3.9           CREATININE (mg/dL)   Date Value   09/21/2017 0.7       Documented patient preferred pharmacies:    [facility], [place], [place] - [place]  Phone: [phone] Fax: [phone]

## 2018-05-15 ENCOUNTER — Encounter (HOSPITAL_BASED_OUTPATIENT_CLINIC_OR_DEPARTMENT_OTHER): Payer: Self-pay | Admitting: Family Medicine

## 2018-05-15 ENCOUNTER — Ambulatory Visit: Payer: No Typology Code available for payment source | Attending: Family Medicine | Admitting: Family Medicine

## 2018-05-15 VITALS — BP 125/81 | HR 76 | Temp 98.4°F | Ht 59.0 in | Wt 154.0 lb

## 2018-05-15 DIAGNOSIS — R232 Flushing: Secondary | ICD-10-CM | POA: Diagnosis present

## 2018-05-15 DIAGNOSIS — N644 Mastodynia: Secondary | ICD-10-CM | POA: Diagnosis present

## 2018-05-15 DIAGNOSIS — R52 Pain, unspecified: Secondary | ICD-10-CM | POA: Insufficient documentation

## 2018-05-15 DIAGNOSIS — Z789 Other specified health status: Secondary | ICD-10-CM | POA: Insufficient documentation

## 2018-05-15 NOTE — Progress Notes (Signed)
BP 125/81  Pulse 76  Temp 98.4 F (36.9 C) (Temporal)  Wt 69.9 kg (154 lb)  LMP 07/11/2005  SpO2 99%  BMI 30.64 kg/m2    S:    Stephanie [name] is a 57 year old female who presents with:    1 mo since bresat pain     6 months with hot flashes   Night especially    2006 oopherectomy   Reviewed path and they were removed buyt no hot flashes       Past Medical History:  No date: Depression  08/16/2010: Elevated hemoglobin A1c      Comment:  HEMOGLOBIN A1C (%) Date Value 03/06/2015 5.6                05/20/2014 5.4 05/08/2012 6.0 (H)   No results found for:               POCA1C   11/21/2012: Hyperopia with astigmatism and presbyopia  11/21/2012: Immature cataract  08/13/2016: Squamous 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   eyes    ROS: Per HPI. Denies headaches, weakness/falls, chest pain, shortness of breath, abdominal pain/nausea/vomiting, numbness/tingling, fevers or chills.     Medications, allergies, PMH and SH reviewed and updated as necessary in Epic.    O:  PHYSICAL EXAM:  BP 125/81  Pulse 76  Temp 98.4 F (36.9 C) (Temporal)  Wt 69.9 kg (154 lb)  LMP 07/11/2005  SpO2 99%  BMI 30.64 kg/m2  GEN: NAD  HEENT: MMM  SKIN: WWP  CHEST: Breathing comfortably  NEURO: No focal deficits noted, alert  MSK: Normal gait observed  PSYCH: Appears stated age, appropriate grooming and dress, appropriate affect and mood congruent  BREASTS: Equal size, no deformities or puckering on inspection with arms at sides and above head. Nipples without retraction or inversion. No abnormalities palpated using vertical strip method. No axillary LAD. Pt with painful area RUQ L breast.    A/P:  Stephanie [name] is a 57 year old female presents with:    1. Acute breast pain  Reviewed wanting to get in with breast center and want to coordinate [place] same time  Reviewed hold off mammo since very tender - nothing palpable and will get breast center recs first   - [place] [facility] ( INT)  - [place] US BREAST-AXILLA LEFT; Future    2. Hot  flashes  Reviewed path from hyst and did remove ovaries but didn't get menopause symptoms then - now having them - for a year about - reviewed normal CBC from Jan and pt says already had symptoms at that time      F/u and as needed    Discussed red flag symptoms and reasons to call back or go straight to ED including worsening pain. Pt expressed understanding.     Health Maintenance:  Reviewed HM    Lemar Livings, MD

## 2018-05-15 NOTE — Addendum Note (Signed)
Addended by: Orie Fisherman on: 05/15/2018 01:48 PM     Modules accepted: Orders

## 2018-05-15 NOTE — Addendum Note (Signed)
Addended by: Beverly Milch T on: 05/15/2018 11:03 AM     Modules accepted: Miquel Dunn

## 2018-05-15 NOTE — Addendum Note (Signed)
Addended by: Beverly Milch T on: 05/15/2018 10:50 AM     Modules accepted: Orders, SmartSet

## 2018-05-19 ENCOUNTER — Other Ambulatory Visit (HOSPITAL_BASED_OUTPATIENT_CLINIC_OR_DEPARTMENT_OTHER): Payer: Self-pay

## 2018-05-25 MED FILL — FLUOXETINE 20MG CAPS: 30 days supply | Qty: 30 | Fill #1 | Status: CP

## 2018-05-25 MED FILL — OMEPRAZOLE 20MG: 30 days supply | Qty: 30 | Fill #4 | Status: CP

## 2018-05-25 MED FILL — HYDROCHLOROT 25MG: 30 days supply | Qty: 30 | Fill #7 | Status: CP

## 2018-06-01 ENCOUNTER — Other Ambulatory Visit (HOSPITAL_BASED_OUTPATIENT_CLINIC_OR_DEPARTMENT_OTHER): Payer: Self-pay

## 2018-06-16 ENCOUNTER — Other Ambulatory Visit (HOSPITAL_BASED_OUTPATIENT_CLINIC_OR_DEPARTMENT_OTHER): Payer: Self-pay | Admitting: Surgery

## 2018-06-16 ENCOUNTER — Ambulatory Visit
Admission: RE | Admit: 2018-06-16 | Discharge: 2018-06-16 | Disposition: A | Discharge status: Home / Self Care | Payer: No Typology Code available for payment source | Attending: Surgery | Admitting: Surgery

## 2018-06-16 ENCOUNTER — Encounter (HOSPITAL_BASED_OUTPATIENT_CLINIC_OR_DEPARTMENT_OTHER): Payer: Self-pay

## 2018-06-16 ENCOUNTER — Ambulatory Visit (HOSPITAL_COMMUNITY)
Admission: RE | Admit: 2018-06-16 | Discharge: 2018-06-16 | Disposition: A | Discharge status: Home / Self Care | Payer: No Typology Code available for payment source | Source: Ambulatory Visit

## 2018-06-16 DIAGNOSIS — R928 Other abnormal and inconclusive findings on diagnostic imaging of breast: Secondary | ICD-10-CM | POA: Diagnosis not present

## 2018-06-16 DIAGNOSIS — N644 Mastodynia: Secondary | ICD-10-CM | POA: Diagnosis not present

## 2018-06-16 DIAGNOSIS — R232 Flushing: Secondary | ICD-10-CM | POA: Diagnosis present

## 2018-06-16 DIAGNOSIS — Z789 Other specified health status: Secondary | ICD-10-CM

## 2018-06-16 DIAGNOSIS — R52 Pain, unspecified: Principal | ICD-10-CM

## 2018-06-24 MED FILL — OMEPRAZOLE 20MG: 30 days supply | Qty: 30 | Fill #5 | Status: CP

## 2018-06-24 MED FILL — FLUOXETINE 20MG CAPS: 30 days supply | Qty: 30 | Fill #2 | Status: CP

## 2018-06-24 MED FILL — HYDROCHLOROT 25MG: 30 days supply | Qty: 30 | Fill #8 | Status: CP

## 2018-06-28 ENCOUNTER — Other Ambulatory Visit (HOSPITAL_BASED_OUTPATIENT_CLINIC_OR_DEPARTMENT_OTHER): Payer: Self-pay | Admitting: Surgery

## 2018-07-05 ENCOUNTER — Other Ambulatory Visit (HOSPITAL_BASED_OUTPATIENT_CLINIC_OR_DEPARTMENT_OTHER): Payer: Self-pay | Admitting: Family Medicine

## 2018-07-12 ENCOUNTER — Encounter (HOSPITAL_BASED_OUTPATIENT_CLINIC_OR_DEPARTMENT_OTHER): Payer: Self-pay | Admitting: Family Medicine

## 2018-07-12 ENCOUNTER — Other Ambulatory Visit (HOSPITAL_BASED_OUTPATIENT_CLINIC_OR_DEPARTMENT_OTHER): Payer: Self-pay | Admitting: Family Medicine

## 2018-07-19 ENCOUNTER — Encounter (HOSPITAL_BASED_OUTPATIENT_CLINIC_OR_DEPARTMENT_OTHER): Payer: Self-pay | Admitting: Family Medicine

## 2018-07-26 MED FILL — FLUOXETINE 20MG CAPS: 30 days supply | Qty: 30 | Fill #3 | Status: CP

## 2018-07-26 MED FILL — HYDROCHLOROT 25MG: 30 days supply | Qty: 30 | Fill #9 | Status: CP

## 2018-07-26 MED FILL — OMEPRAZOLE 20MG: 30 days supply | Qty: 30 | Fill #6 | Status: CP

## 2018-08-18 ENCOUNTER — Encounter (HOSPITAL_BASED_OUTPATIENT_CLINIC_OR_DEPARTMENT_OTHER): Payer: Self-pay | Admitting: Family Medicine

## 2018-08-18 ENCOUNTER — Ambulatory Visit: Payer: No Typology Code available for payment source | Attending: Family Medicine | Admitting: Family Medicine

## 2018-08-18 VITALS — BP 129/77 | HR 78 | Temp 97.8°F | Ht 59.0 in | Wt 155.0 lb

## 2018-08-18 DIAGNOSIS — Z6832 Body mass index (BMI) 32.0-32.9, adult: Secondary | ICD-10-CM

## 2018-08-18 DIAGNOSIS — M25562 Pain in left knee: Secondary | ICD-10-CM | POA: Diagnosis present

## 2018-08-18 DIAGNOSIS — I10 Essential (primary) hypertension: Secondary | ICD-10-CM | POA: Diagnosis present

## 2018-08-18 DIAGNOSIS — G8929 Other chronic pain: Secondary | ICD-10-CM

## 2018-08-18 DIAGNOSIS — F339 Major depressive disorder, recurrent, unspecified: Secondary | ICD-10-CM | POA: Diagnosis present

## 2018-08-18 DIAGNOSIS — E6609 Other obesity due to excess calories: Secondary | ICD-10-CM

## 2018-08-18 DIAGNOSIS — Z23 Encounter for immunization: Secondary | ICD-10-CM | POA: Diagnosis present

## 2018-08-18 DIAGNOSIS — K219 Gastro-esophageal reflux disease without esophagitis: Secondary | ICD-10-CM | POA: Diagnosis present

## 2018-08-18 DIAGNOSIS — F5104 Psychophysiologic insomnia: Secondary | ICD-10-CM | POA: Diagnosis present

## 2018-08-18 MED ORDER — TRAZODONE HCL 50 MG PO TABS: 50 mg | tablet | Freq: Every evening | ORAL | 11 refills | 0 days | Status: AC

## 2018-08-18 MED ORDER — TRAZODONE HCL 50 MG PO TABS
50.0000 mg | ORAL_TABLET | Freq: Every evening | ORAL | 11 refills | Status: DC
Start: 2018-08-18 — End: 2018-10-16

## 2018-08-18 MED FILL — TRAZODONE  50MG: 30 days supply | Qty: 30 | Fill #0 | Status: CP

## 2018-08-18 NOTE — Progress Notes (Signed)
Pt presented for flu shot, verified name and D.O.B, denies any allergy to vaccines or eggs, 0.39ml Flaurix administered IM to the left deltoid, pt tolerated well , reminded to call back the clinic with any redness or swelling to the injection site or with any other issues or concerns pt verbalized a good understanding and is in agreement with the plan.     08/18/2018  VIS given prior to administration and reviewed with the patient and or legal guardian. Patient understands the disease and the vaccine. See immunization/Injection module or chart review for date of publication and additional information.  Nicole Kindred, RN

## 2018-08-18 NOTE — Progress Notes (Signed)
BP 129/77  Pulse 78  Temp 97.8 F (36.6 C) (Temporal)  Ht 4\' 11"  (1.499 m)  Wt 70.3 kg (155 lb)  LMP 07/11/2005  SpO2 97%  BMI 31.31 kg/m2    S:    Timika [name] is a 57 year old female who presents with:    Had pain L side      Nail fungus    Hard to sleep   Hard to fall asleep and wakes up  2 months   Little SOB   A lot of fatigue  No stress     Hot flashes        Past Medical History:  No date: Depression  08/16/2010: Elevated hemoglobin A1c      Comment:  HEMOGLOBIN A1C (%) Date Value 03/06/2015 5.6                05/20/2014 5.4 05/08/2012 6.0 (H)   No results found for:               POCA1C   11/21/2012: Hyperopia with astigmatism and presbyopia  11/21/2012: Immature cataract  08/13/2016: Squamous 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   eyes    Review of patient's family history indicates:  Problem: Cancer - Other      Relation: Father          Age of Onset: (Not Specified)          Comment: stomach  Problem: Heart      Relation: Brother          Age of Onset: (Not Specified)          Comment: died at 58 during valve replacement  Problem: Heart      Relation: Brother          Age of Onset: (Not Specified)          Comment: chestpain with neg w/u  Problem: Heart      Relation: Mother          Age of Onset: (Not Specified)          Comment: Died of MI age 69       Social History    Tobacco Use      Smoking status: Former Smoker        Types: Cigarettes        Quit date: 07/27/2001        Years since quitting: 17.0      Smokeless tobacco: Never Used    Alcohol use: Yes      Alcohol/week: 16.7 standard drinks      Comment: occasional, weekends    Drug use: No      ROS: Per HPI. Denies headaches, weakness/falls, chest pain, shortness of breath, abdominal pain/nausea/vomiting, numbness/tingling, fevers or chills.     Medications, allergies, PMH and SH reviewed and updated as necessary in Epic.    O:  PHYSICAL EXAM:  BP 129/77  Pulse 78  Temp 97.8 F (36.6 C) (Temporal)  Ht 4\' 11"  (1.499 m)  Wt 70.3 kg (155 lb)   LMP 07/11/2005  SpO2 97%  BMI 31.31 kg/m2  GEN: NAD  HEENT: MMM  SKIN: WWP  CHEST: Breathing comfortably  NEURO: No focal deficits noted, alert  MSK: Normal gait observed  PSYCH: Appears stated age, appropriate grooming and dress, appropriate affect and mood congruent  NAIL: ONe nail lifting up of nailbed with intact below and matrix    A/P:  Stephanie [name] is a 57 year  old female presents with:    1. Need for prophylactic vaccination and inoculation against influenza  due  - IMMUNIZATION ADMIN SINGLE  - IIV4 VACC PRESERV FREE AGE 36 MONTHS AND OLDER, 0.5ML, IM    2. Depression, recurrent ([place])  Reviewed therapy    3. Chronic pain of left knee  Reviewed trial PT  - REFERRAL TO PHYSICAL THERAPY ( INT)    4. Psychophysiological insomnia  Reviewed previous trials  - traZODone (DESYREL) 50 MG tablet; Take 1 tablet by mouth nightly  Dispense: 30 tablet; Refill: 11    5. Hypertension, goal below 140/90  At goal - reviewed previous med - lasix    6. Gastroesophageal reflux disease, esophagitis presence not specified  Long standing - can't be without omeprazole  - REFERRAL TO GASTROENTEROLOGY ( INT)    7. Class 1 obesity due to excess calories without serious comorbidity with body mass index (BMI) of 32.0 to 32.9 in adult  Off phentermine for now      We discussed the patients current medications. The patient expressed understanding and no barriers to adherence were identified.    Health Maintenance:  Reviewed HM    Lemar Livings, MD

## 2018-08-31 ENCOUNTER — Encounter (HOSPITAL_BASED_OUTPATIENT_CLINIC_OR_DEPARTMENT_OTHER): Payer: Self-pay

## 2018-09-01 MED FILL — HYDROCHLOROT 25MG: 30 days supply | Qty: 30 | Fill #10

## 2018-09-01 MED FILL — OMEPRAZOLE 20MG: 30 days supply | Qty: 30 | Fill #7

## 2018-09-01 MED FILL — OMEPRAZOLE 20MG: 30 days supply | Qty: 30 | Fill #7 | Status: CP

## 2018-09-01 MED FILL — HYDROCHLOROT 25MG: 30 days supply | Qty: 30 | Fill #10 | Status: CP

## 2018-10-02 ENCOUNTER — Encounter (HOSPITAL_BASED_OUTPATIENT_CLINIC_OR_DEPARTMENT_OTHER): Payer: Self-pay | Admitting: Family Medicine

## 2018-10-07 ENCOUNTER — Other Ambulatory Visit (HOSPITAL_BASED_OUTPATIENT_CLINIC_OR_DEPARTMENT_OTHER): Payer: Self-pay | Admitting: Family Medicine

## 2018-10-07 DIAGNOSIS — K2100 Gastro-esophageal reflux disease with esophagitis, without bleeding: Secondary | ICD-10-CM

## 2018-10-07 MED FILL — FLUOXETINE 20MG CAPS: 30 days supply | Qty: 30 | Fill #4 | Status: CP

## 2018-10-07 NOTE — Progress Notes (Signed)
PER Pharmacy, Stephanie [name] is a 58 year old female has requested a refill of omeprazole (PRILOSEC) 20 MG capsule.      Last Office Visit: 08/18/2018 with [place],A  Last Physical Exam: 02/21/2013    There are no preventive care reminders to display for this patient.    Other Med Adult:  Most Recent BP Reading(s)  08/18/18 : 129/77        Cholesterol (mg/dL)   Date Value   02/06/2016 210     LOW DENSITY LIPOPROTEIN DIRECT (mg/dL)   Date Value   02/06/2016 112     HIGH DENSITY LIPOPROTEIN (mg/dL)   Date Value   02/06/2016 71     TRIGLYCERIDES (mg/dl)   Date Value   01/20/2009 88         THYROID SCREEN TSH REFLEX FT4 (uIU/mL)   Date Value   02/06/2016 0.785         TSH (THYROID STIM HORMONE) (uIU/mL)   Date Value   12/10/2016 0.790       HEMOGLOBIN A1C (%)   Date Value   03/06/2015 5.6       No results found for: POCA1C      INR (no units)   Date Value   04/22/2008 1.0 (L)   02/07/2005 1.0 (L)       SODIUM (mmol/L)   Date Value   09/21/2017 135 (L)       POTASSIUM (mmol/L)   Date Value   09/21/2017 3.9           CREATININE (mg/dL)   Date Value   09/21/2017 0.7       Documented patient preferred pharmacies:    [facility], [place] - [address]  Phone: [phone] Fax: [phone]

## 2018-10-14 ENCOUNTER — Other Ambulatory Visit (HOSPITAL_BASED_OUTPATIENT_CLINIC_OR_DEPARTMENT_OTHER): Payer: Self-pay | Admitting: Family Medicine

## 2018-10-14 DIAGNOSIS — I10 Essential (primary) hypertension: Principal | ICD-10-CM

## 2018-10-15 NOTE — Progress Notes (Signed)
PER Pharmacy, Margit Batte is a 58 year old female has requested a refill of Hydrochlorothiazide 25 mg.      Last Office Visit: 08-18-2018 with PCP  Last Physical Exam: 02-21-2013    There are no preventive care reminders to display for this patient.    Other Med Adult:  Most Recent BP Reading(s)  08/18/18 : 129/77        Cholesterol (mg/dL)   Date Value   02/06/2016 210     LOW DENSITY LIPOPROTEIN DIRECT (mg/dL)   Date Value   02/06/2016 112     HIGH DENSITY LIPOPROTEIN (mg/dL)   Date Value   02/06/2016 71     TRIGLYCERIDES (mg/dl)   Date Value   01/20/2009 88         THYROID SCREEN TSH REFLEX FT4 (uIU/mL)   Date Value   02/06/2016 0.785         TSH (THYROID STIM HORMONE) (uIU/mL)   Date Value   12/10/2016 0.790       HEMOGLOBIN A1C (%)   Date Value   03/06/2015 5.6       No results found for: POCA1C      INR (no units)   Date Value   04/22/2008 1.0 (L)   02/07/2005 1.0 (L)       SODIUM (mmol/L)   Date Value   09/21/2017 135 (L)       POTASSIUM (mmol/L)   Date Value   09/21/2017 3.9           CREATININE (mg/dL)   Date Value   09/21/2017 0.7       Documented patient preferred pharmacies:    [facility], [place] - [address]  Phone: [phone] Fax: [phone]

## 2018-10-16 ENCOUNTER — Ambulatory Visit: Payer: No Typology Code available for payment source | Attending: Family Medicine | Admitting: Physician Assistant

## 2018-10-16 ENCOUNTER — Encounter (HOSPITAL_BASED_OUTPATIENT_CLINIC_OR_DEPARTMENT_OTHER): Payer: Self-pay | Admitting: Physician Assistant

## 2018-10-16 VITALS — BP 136/89 | HR 88 | Temp 98.3°F | Wt 156.6 lb

## 2018-10-16 DIAGNOSIS — M79602 Pain in left arm: Secondary | ICD-10-CM | POA: Insufficient documentation

## 2018-10-16 DIAGNOSIS — M79601 Pain in right arm: Secondary | ICD-10-CM | POA: Insufficient documentation

## 2018-10-16 DIAGNOSIS — I10 Essential (primary) hypertension: Secondary | ICD-10-CM | POA: Insufficient documentation

## 2018-10-16 DIAGNOSIS — K2100 Gastro-esophageal reflux disease with esophagitis, without bleeding: Secondary | ICD-10-CM

## 2018-10-16 DIAGNOSIS — F339 Major depressive disorder, recurrent, unspecified: Secondary | ICD-10-CM | POA: Diagnosis present

## 2018-10-16 DIAGNOSIS — F5104 Psychophysiologic insomnia: Secondary | ICD-10-CM | POA: Insufficient documentation

## 2018-10-16 LAB — RBC SEDIMENTATION RATE: RBC SEDIMENTATION RATE: 19 MM/HR (ref 0–30)

## 2018-10-16 LAB — C-REACTIVE PROTEIN: C-REACTIVE PROTEIN: 0.7 mg/dL (ref 0.0–1.8)

## 2018-10-16 MED ORDER — DICLOFENAC SODIUM 1 % TD GEL
2.00 g | Freq: Two times a day (BID) | TRANSDERMAL | 0 refills | Status: AC
Start: 2018-10-16 — End: 2018-11-15

## 2018-10-16 MED ORDER — TRAZODONE HCL 50 MG PO TABS: 50 mg | tablet | Freq: Every evening | ORAL | 11 refills | 0 days | Status: DC

## 2018-10-16 MED ORDER — LIDOCAINE 5 % EX PTCH: 1 | patch | CUTANEOUS | 0 refills | 0 days | Status: AC

## 2018-10-16 MED ORDER — DULOXETINE HCL 30 MG PO CPEP
30.0000 mg | ORAL_CAPSULE | Freq: Every day | ORAL | 1 refills | Status: DC
Start: 2018-10-16 — End: 2019-04-05

## 2018-10-16 MED ORDER — HYDROCHLOROTHIAZIDE 25 MG PO TABS
25.0000 mg | ORAL_TABLET | Freq: Every day | ORAL | 11 refills | Status: DC
Start: 2018-10-16 — End: 2018-10-18

## 2018-10-16 MED ORDER — DULOXETINE HCL 30 MG PO CPEP: 30 mg | capsule | Freq: Every day | ORAL | 1 refills | 0 days | Status: AC

## 2018-10-16 MED ORDER — LIDOCAINE 5 % EX PTCH
1.0000 | MEDICATED_PATCH | CUTANEOUS | 0 refills | Status: DC
Start: 2018-10-16 — End: 2018-11-03

## 2018-10-16 MED ORDER — FLUOXETINE HCL 20 MG PO CAPS: 20 mg | capsule | Freq: Every day | ORAL | 11 refills | 0 days | Status: DC

## 2018-10-16 MED ORDER — OMEPRAZOLE 20 MG PO CPDR
20.0000 mg | DELAYED_RELEASE_CAPSULE | Freq: Every day | ORAL | 10 refills | Status: DC
Start: 2018-10-16 — End: 2019-06-18

## 2018-10-16 MED ORDER — TRAZODONE HCL 50 MG PO TABS: 50 mg | tablet | Freq: Every evening | ORAL | 11 refills | 0 days | Status: AC

## 2018-10-16 MED ORDER — DICLOFENAC SODIUM 1 % TD GEL: 2 g | g | Freq: Two times a day (BID) | 0 refills | 0 days | Status: AC

## 2018-10-16 MED ORDER — TRAZODONE HCL 50 MG PO TABS
50.0000 mg | ORAL_TABLET | Freq: Every evening | ORAL | 11 refills | Status: DC
Start: 2018-10-16 — End: 2018-10-16

## 2018-10-16 MED ORDER — TRAZODONE HCL 50 MG PO TABS
50.0000 mg | ORAL_TABLET | Freq: Every evening | ORAL | 11 refills | Status: DC
Start: 2018-10-16 — End: 2019-10-29

## 2018-10-16 MED ORDER — FLUOXETINE HCL 20 MG PO CAPS
20.0000 mg | ORAL_CAPSULE | Freq: Every day | ORAL | 11 refills | Status: DC
Start: 2018-10-16 — End: 2018-10-16

## 2018-10-16 MED ORDER — OMEPRAZOLE 20 MG PO CPDR: 20 mg | capsule | Freq: Every day | ORAL | 10 refills | 0 days | Status: AC

## 2018-10-16 MED ORDER — HYDROCHLOROTHIAZIDE 25 MG PO TABS: 25 mg | tablet | Freq: Every day | ORAL | 11 refills | 0 days | Status: AC

## 2018-10-16 MED FILL — LIDOCAINE PATCH 5%: 15 days supply | Qty: 15 | Fill #0 | Status: CP

## 2018-10-16 MED FILL — DULOXETINE 30MG: 30 days supply | Qty: 30 | Fill #0 | Status: CP

## 2018-10-16 MED FILL — DICLOFENAC GEL 1%: 30 days supply | Qty: 100 | Fill #0 | Status: CP

## 2018-10-16 MED FILL — HYDROCHLOROT 25MG: 30 days supply | Qty: 30 | Fill #0 | Status: CP

## 2018-10-16 MED FILL — TRAZODONE  50MG: 30 days supply | Qty: 30 | Fill #0 | Status: CP

## 2018-10-16 NOTE — Progress Notes (Signed)
Chief Complaint:No chief complaint on file.      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     Review of Systems   Constitutional: Negative.    Respiratory: Negative.    Cardiovascular: Negative.    Musculoskeletal: Positive for arthralgias, myalgias and neck pain. Negative for back pain, gait problem, joint swelling and neck stiffness.   Neurological: Negative.         Objective:     Vitals  BP 136/89  Pulse 88  Temp 98.3 F (36.8 C) (Temporal)  Wt 71 kg (156 lb 9.6 oz)  LMP 07/11/2005  SpO2 95%  BMI 31.63 kg/m2  Pain Score: Data Unavailable      Physical Exam    Gen: NAD, AOOx3  Heart: regular rate, no murmurs, rubs, gallops  Lungs: clear to auscultation bilaterally without wheezes rales rhonchi  Musculoskeletal: Normal range of motion.   Neck: Inspection of cervical spine reveals no ecchymosis, erythema, or bony abnormality. AROM (lateral bending, flexion/extension) not limited to pain. Palpation of spinous processes and paraspinal muscles reveals no pain.Strength 5/5. Sensation intact. Reflexes 2+.   Shoulder: Inspection of right shoulder reveals no ecchymosis, erythema, or bony abnormality. AROM of shoulder (internal/external rotation, abduction/adduction, flexion/extension) limited by pain. TTP of lateral deltoid and triceps muscle. Strength 5/5. Sensation intact. Reflexes 2+. Pulses 2+.   Neurological: alert and oriented to person, place, and time. Gait is normal.   Skin: Skin is warm.   Psych: appropriate  mood and affect.       ASSESSMENT AND PLAN:    Stephanie [name] is a 58 year old female with:    1. Pain in both upper extremities  May have a rheumatologic component. Will check inflammatory markers. For now, may use anti inflammatory gel and lidocaine patch. Recommend gentle movement. Will refer to physiatry. May benefit from PT/OT. Return to clinic if symptoms worsen or fail to improve. Discussed when to return to office vs seek emergent care.  - COLLECTION VENOUS BLOOD VENIPUNCTURE  - RBC SEDIMENTATION RATE  - C-REACTIVE PROTEIN  - REFERRAL TO PHYSIATRY ( INT)  - diclofenac (VOLTAREN) 1 % GEL Gel; Apply 2 g topically 2 (two) times daily Do Not Exceed 32 g in 24 Hours  Dispense: 100 g; Refill: 0  - DULoxetine (CYMBALTA) 30 MG capsule; Take 1 capsule by mouth daily  Dispense: 30 capsule; Refill: 1  - lidocaine (LIDODERM) 5 % patch; Place 1 patch onto the skin daily  for 15 days  Dispense: 15 patch; Refill: 0    2. Gastroesophageal reflux disease with esophagitis  Not discussed in great detail. Refill sent to pharmacy. Discussed risks. Going to GI in may.  Return to clinic if symptoms worsen or fail to improve. Discussed when to return to office vs seek emergent care.  - omeprazole (PRILOSEC) 20 MG capsule; Take 1  capsule by mouth daily  Dispense: 30 capsule; Refill: 10    3. Psychophysiological insomnia  Not discussed in great detail. Refill sent to pharmacy. Return to clinic if symptoms worsen or fail to improve. Discussed when to return to office vs seek emergent care.  - traZODone (DESYREL) 50 MG tablet; Take 1 tablet by mouth nightly  Dispense: 30 tablet; Refill: 11    4. Depression, recurrent ([place])  D/c fluoxetine and start Cymbalta due to co morbidity of depression and chronic pain.  Follow up in 2 weeks. Risks and benefits discussed. Return to clinic if symptoms worsen or fail to improve. Discussed when to return to office vs seek emergent care.  - DULoxetine (CYMBALTA) 30 MG capsule; Take 1 capsule by mouth  daily  Dispense: 30 capsule; Refill: 1    5. Hypertension, goal below 140/90  Not discussed in great detail. Refill sent to pharmacy. Return to clinic if symptoms worsen or fail to improve. Discussed when to return to office vs seek emergent care.    Patient and/or guardian expresses understanding and agreement with the above plan.     Venancio Poisson, PA-C, 10/16/2018

## 2018-11-03 ENCOUNTER — Other Ambulatory Visit (HOSPITAL_BASED_OUTPATIENT_CLINIC_OR_DEPARTMENT_OTHER): Payer: Self-pay | Admitting: Family Medicine

## 2018-11-03 DIAGNOSIS — M79601 Pain in right arm: Principal | ICD-10-CM

## 2018-11-03 DIAGNOSIS — M79602 Pain in left arm: Principal | ICD-10-CM

## 2018-11-03 NOTE — Progress Notes (Signed)
PER Pharmacy, Stephanie [name] is a 58 year old female has requested a refill of lidocaine patches.      Last Office Visit: 10/16/2018 with Venancio Poisson   Last Physical Exam: 02/21/2013    There are no preventive care reminders to display for this patient.    Other Med Adult:  Most Recent BP Reading(s)  10/16/18 : 136/89        Cholesterol (mg/dL)   Date Value   02/06/2016 210     LOW DENSITY LIPOPROTEIN DIRECT (mg/dL)   Date Value   02/06/2016 112     HIGH DENSITY LIPOPROTEIN (mg/dL)   Date Value   02/06/2016 71     TRIGLYCERIDES (mg/dl)   Date Value   01/20/2009 88         THYROID SCREEN TSH REFLEX FT4 (uIU/mL)   Date Value   02/06/2016 0.785         TSH (THYROID STIM HORMONE) (uIU/mL)   Date Value   12/10/2016 0.790       HEMOGLOBIN A1C (%)   Date Value   03/06/2015 5.6       No results found for: POCA1C      INR (no units)   Date Value   04/22/2008 1.0 (L)   02/07/2005 1.0 (L)       SODIUM (mmol/L)   Date Value   09/21/2017 135 (L)       POTASSIUM (mmol/L)   Date Value   09/21/2017 3.9           CREATININE (mg/dL)   Date Value   09/21/2017 0.7       Documented patient preferred pharmacies:    [facility], [place] - [address]  Phone: [phone] Fax: [phone]

## 2018-11-06 MED ORDER — LIDOCAINE 5 % EX PTCH: 1 | patch | CUTANEOUS | 0 refills | 0 days | Status: AC

## 2018-11-06 MED ORDER — LIDOCAINE 5 % EX PTCH
1.00 | MEDICATED_PATCH | CUTANEOUS | 0 refills | Status: AC
Start: 2018-11-06 — End: 2018-11-21

## 2018-11-06 MED FILL — LIDOCAINE PATCH 5%: 15 days supply | Qty: 15 | Fill #0 | Status: CP

## 2018-11-06 MED FILL — LIDOCAINE PATCH 5%: 15 days supply | Qty: 15 | Fill #0

## 2018-11-14 ENCOUNTER — Telehealth (HOSPITAL_BASED_OUTPATIENT_CLINIC_OR_DEPARTMENT_OTHER): Payer: Self-pay | Admitting: Rehabilitative and Restorative Service Providers"

## 2018-11-14 MED FILL — HYDROCHLOROT 25MG: 30 days supply | Qty: 30 | Fill #0

## 2018-11-14 MED FILL — *OMEPRAZOLE   20MG: 30 days supply | Qty: 30 | Fill #0 | Status: CP

## 2018-11-14 MED FILL — OMEPRAZOLE 20MG: 30 days supply | Qty: 30 | Fill #0

## 2018-11-14 MED FILL — *HYDROCHLOROT 25MG: 30 days supply | Qty: 30 | Fill #0 | Status: CP

## 2018-11-14 NOTE — Progress Notes (Unsigned)
Pt outreach re: [id number]. Reached patient directly via telephone. Let her know that, in an effort to limit the spread of the disease, we are trying to reduce clinic visits and all future appointments have been canceled until further notice. Left this provider's contact info for interim. Pt declined discussing symptoms at this time.    This call was made with the assistance of telephone interpreter Jenny Reichmann for [place]).     Marthann Schiller, [place], Lic # [id number]

## 2018-11-30 ENCOUNTER — Ambulatory Visit (HOSPITAL_BASED_OUTPATIENT_CLINIC_OR_DEPARTMENT_OTHER): Payer: Self-pay | Admitting: Rehabilitative and Restorative Service Providers"

## 2018-12-13 MED FILL — OMEPRAZOLE 20MG: 30 days supply | Qty: 30 | Fill #1 | Status: CP

## 2018-12-13 MED FILL — HYDROCHLOROT 25MG: 30 days supply | Qty: 30 | Fill #1

## 2018-12-13 MED FILL — TRAZODONE  50MG: 30 days supply | Qty: 30 | Fill #1 | Status: CP

## 2018-12-13 MED FILL — OMEPRAZOLE 20MG: 30 days supply | Qty: 30 | Fill #1

## 2018-12-13 MED FILL — TRAZODONE  50MG: 30 days supply | Qty: 30 | Fill #1

## 2018-12-13 MED FILL — HYDROCHLOROT 25MG: 30 days supply | Qty: 30 | Fill #1 | Status: CP

## 2018-12-26 ENCOUNTER — Ambulatory Visit: Payer: No Typology Code available for payment source | Attending: Family Medicine | Admitting: Family Medicine

## 2018-12-26 ENCOUNTER — Other Ambulatory Visit: Payer: Self-pay

## 2018-12-26 DIAGNOSIS — R000 Tachycardia, unspecified: Secondary | ICD-10-CM | POA: Diagnosis not present

## 2018-12-26 DIAGNOSIS — I10 Essential (primary) hypertension: Secondary | ICD-10-CM | POA: Diagnosis present

## 2018-12-26 NOTE — Televisit Note (Signed)
Telemedicine Visit    S:    Stephanie [name] is a 58 year old female who complains of the following:     130/90  Heart beating fast  Talking to her doctor  99   HR   Sometimes notices  No pain    Past Medical History:  02/12/2016: Chronic right shoulder pain  No date: Depression  03/20/2012: Diverticulosis of colon      Comment:  Noted on colonoscopy 6/13   08/16/2010: Elevated hemoglobin A1c      Comment:  HEMOGLOBIN A1C (%) Date Value 03/06/2015 5.6                05/20/2014 5.4 05/08/2012 6.0 (H)   No results found for:               POCA1C   11/21/2012: Hyperopia with astigmatism and presbyopia  11/21/2012: Immature cataract  01/21/2012: Impingement syndrome of right shoulder      Comment:  10/14: pt taking  [place] med (meloxicam 7.5 mg BID                and cyclobenzaprine); Will ask for refill once supply                finished; PCP ok with refill of flexeril on temporary                basis;   01/26/2008: Irritable bladder      Comment:  Seen by Dr Jeraldine Loots 5/09, no incontinence, likely                irritable bladder.  Trial of anticholinergic, refer to                urology, has appt Dr Minna Antis 02/23/08  02/16/2005: Leiomyoma of uterus, unspecified      Comment:  Dr Jeraldine Loots hysterectomy November, 2006; cervix retained,               last pap 11/11 neg Repeat pap/HPV 07/2015   02/15/2014: Mass of hand  07/30/2011: Other plastic surgery for unacceptable cosmetic appearance      Comment:  Breast augmentation scheduled 08/02/11;  Dr.  Ancil Boozer Phone#: [phone]     01/08/2016: Schistosomiasis  08/13/2016: Squamous 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   eyes  06/18/2004: Urinary calculus, unspecified    ROS: Per HPI. Denies headaches, weakness/falls, chest pain, shortness of breath, abdominal pain/nausea/vomiting, numbness/tingling, fevers or chills.     Medications, allergies, PMH and SH reviewed and updated as necessary in Epic.    O:  No breathlessness  Speaking in full sentences  Normal  affect  BP as noted just now 130/90   HR 99    A/P:  Stephanie [name] is a 58 year old female presents with:    1. Hypertension, goal below 140/90  Reviewed at goal still - would like more data points   Recommend 2 wks AM BP and we can fu with those results  To call back sooner if higher bp or any other symptoms    2. Tachycardia  Reviewed WNL but could be stress related - dehydration or anemia - take HR along with BP and fu 2 wks        Health Maintenance:  Reviewed HM    Lemar Livings, MD        This patient was identified as meeting  criteria for a televisit rather than an in person visit due to public health concerns around COVID-19. A complete assessment and plan is detailed in the note, all of which were conducted remotely using telephone technology.  Patient identity was verbally confirmed by the patient/guardian with 2 identifiers (name, date of birth, and/or address) at the beginning of the visit  Patient/guardian verbally consented to care by televisit as appropriate.   Patient/guardian was located home during the visit and confirmed that they understood they were encouraged to be in private location due to personal health information being discussed.  Patient/guardian was informed how to access face-to-face care in the event of an emergency.  Provider was located outside the office at a secure location during the visit.  If this is a new patient visit, all available records and medical history were reviewed by the provider.  Visit length was 15 minutes and counseling was done on the diagnoses indicated in the visit.

## 2018-12-27 ENCOUNTER — Ambulatory Visit: Payer: No Typology Code available for payment source | Attending: Family Medicine | Admitting: Gastroenterology

## 2018-12-27 ENCOUNTER — Telehealth (HOSPITAL_BASED_OUTPATIENT_CLINIC_OR_DEPARTMENT_OTHER): Payer: Self-pay

## 2018-12-27 DIAGNOSIS — D126 Benign neoplasm of colon, unspecified: Secondary | ICD-10-CM

## 2018-12-27 DIAGNOSIS — K2100 Gastro-esophageal reflux disease with esophagitis, without bleeding: Secondary | ICD-10-CM

## 2018-12-27 NOTE — Televisit Note (Signed)
This patient was identified as meeting criteria for a televisit rather than an in person visit due to public health concerns around COVID-19. A complete assessment and plan is detailed in the note, all of which were conducted remotely using telephone technology.  Patient identity was verbally confirmed by the patient/guardian with 2 identifiers (name, date of birth, and/or address) at the beginning of the visit  Patient/guardian verbally consented to care by televisit as appropriate.   Patient/guardian was located home during the visit and confirmed that they understood they were encouraged to be in private location due to personal health information being discussed.  Patient/guardian was informed how to access face-to-face care in the event of an emergency.  Provider was located outside the office at a secure location during the visit.  If this is a new patient visit, all available records and medical history were reviewed by the provider.  Visit length was 15 minutes and counseling was done on the diagnoses indicated in the visit.        This office note has been dictated. Account number Data Unavailable

## 2018-12-27 NOTE — Progress Notes (Addendum)
Date of Service: 12/27/2018    CHIEF COMPLAINT:  The patient had a televisit for complaints of heartburn.    HISTORY OF PRESENT ILLNESS:  The patient is a 58 year old woman who was diagnosed to have a hiatus hernia with erosions in the distal esophagus in 2013.  Biopsy of the esophagus also showed evidence of esophagitis.  She has been taking omeprazole 20 mg every morning with good relief of her symptoms.  She is wondering how she could stop taking the medication because of its possible side effects.  She notices that if she stops taking the omeprazole, the heartburn comes back immediately.  She denies having any dysphagia, vomiting, or hematemesis.  She has gained weight in the last few years and consumes alcohol in the form of beer on a regular basis.    The patient also has a history of colon polyps, and the last colonoscopy was in 2016.  She denies having any blood in the stools or change in bowel habits.    PAST MEDICAL HISTORY:  Colon polyp, hiatus hernia, GERD, hypertension, hypercholesterolemia.    ALLERGIES:  No known allergies.    MEDICATIONS:  HydroDIURIL, omeprazole, Cymbalta, and trazodone.    SOCIAL HISTORY:  She does not smoke, but she drinks alcohol regularly.    ASSESSMENT AND PLAN:  I discussed this with the patient the antireflux measures that she should follow, both dietary and postural.  I advised her to try and decrease her beer intake and to lose some weight.  I also told her that it is unlikely that she would be able to wean herself off the medication because she had evidence of esophagitis when she had her endoscopy 7 years ago, I advised her to continue taking the low dose omeprazole of 20 mg daily, as long as it controls her symptoms.    She needs to have a colonoscopy next year because she had an adenomatous polyp 5 years ago, so when she is due for the procedure, an upper endoscopy should be performed because of the family history of gastric  cancer.    ___________________________  Reviewed and Electronically Signed By: Junius Argyle MD  Sig Date: 03/14/2019  Sig Time: 17:56:23  Dictated By: Junius Argyle MD  Dict Date: 12/27/2018 Dict Time: 10 07 AM    Dictation Date and Time:12/27/2018 10:07:39  Transcription Date and Time:12/27/2018 11:16:20  eScription Dictation id: [id number] Confirmation # :[id number]      cc: Orie Fisherman MD

## 2018-12-27 NOTE — Progress Notes (Signed)
LVM w/ [place] interpreter Michiel Cowboy to schedule 2 week bp f/u with PCP.

## 2019-01-03 ENCOUNTER — Encounter (HOSPITAL_BASED_OUTPATIENT_CLINIC_OR_DEPARTMENT_OTHER): Payer: Self-pay | Admitting: Physical Medicine & Rehabilitation

## 2019-01-25 MED FILL — OMEPRAZOLE 20MG: 30 days supply | Qty: 30 | Fill #2 | Status: CP

## 2019-01-25 MED FILL — TRAZODONE  50MG: 30 days supply | Qty: 30 | Fill #2 | Status: CP

## 2019-01-25 MED FILL — HYDROCHLOROT 25MG: 30 days supply | Qty: 30 | Fill #2 | Status: CP

## 2019-01-29 ENCOUNTER — Telehealth (HOSPITAL_BASED_OUTPATIENT_CLINIC_OR_DEPARTMENT_OTHER): Payer: Self-pay | Admitting: Physical Medicine & Rehabilitation

## 2019-01-29 NOTE — Progress Notes (Signed)
To notify of rescheduled appointment due to Covid-19 to 8/19

## 2019-02-21 ENCOUNTER — Ambulatory Visit (HOSPITAL_BASED_OUTPATIENT_CLINIC_OR_DEPARTMENT_OTHER): Payer: No Typology Code available for payment source | Admitting: Physical Medicine & Rehabilitation

## 2019-02-23 MED FILL — TRAZODONE  50MG: 30 days supply | Qty: 30 | Fill #3 | Status: CP

## 2019-02-23 MED FILL — OMEPRAZOLE 20MG: 30 days supply | Qty: 30 | Fill #3 | Status: CP

## 2019-02-23 MED FILL — DULOXETINE 30MG: 30 days supply | Qty: 30 | Fill #1 | Status: CP

## 2019-02-23 MED FILL — DULOXETINE 30MG: 30 days supply | Qty: 30 | Fill #1

## 2019-02-23 MED FILL — OMEPRAZOLE 20MG: 30 days supply | Qty: 30 | Fill #3

## 2019-02-23 MED FILL — HYDROCHLOROT 25MG: 30 days supply | Qty: 30 | Fill #3 | Status: CP

## 2019-02-23 MED FILL — HYDROCHLOROT 25MG: 30 days supply | Qty: 30 | Fill #3

## 2019-02-23 MED FILL — TRAZODONE  50MG: 30 days supply | Qty: 30 | Fill #3

## 2019-03-06 ENCOUNTER — Other Ambulatory Visit: Payer: Self-pay

## 2019-03-09 ENCOUNTER — Ambulatory Visit (HOSPITAL_BASED_OUTPATIENT_CLINIC_OR_DEPARTMENT_OTHER): Payer: Self-pay | Admitting: Ophthalmology

## 2019-03-12 ENCOUNTER — Other Ambulatory Visit: Payer: Self-pay

## 2019-03-12 ENCOUNTER — Ambulatory Visit: Payer: No Typology Code available for payment source | Attending: Family Medicine | Admitting: Family Medicine

## 2019-03-12 DIAGNOSIS — M7541 Impingement syndrome of right shoulder: Secondary | ICD-10-CM

## 2019-03-12 NOTE — Progress Notes (Signed)
Telemedicine Visit    S:    Stephanie [name] is a 58 year old female who complains of the following:     Arm pain R arm thought it was zoster  Had EMG and was diagnostic  Last year   Thought could be zoster  Now in this arm   1 month  When uses arm to get something hard to drive and sleeping is hard  More shoulder where you get injection  Sleeping is the hardest            Past Medical History:  02/12/2016: Chronic right shoulder pain  No date: Depression  03/20/2012: Diverticulosis of colon      Comment:  Noted on colonoscopy 6/13   08/16/2010: Elevated hemoglobin A1c      Comment:  HEMOGLOBIN A1C (%) Date Value 03/06/2015 5.6                05/20/2014 5.4 05/08/2012 6.0 (H)   No results found for:               POCA1C   11/21/2012: Hyperopia with astigmatism and presbyopia  11/21/2012: Immature cataract  01/21/2012: Impingement syndrome of right shoulder      Comment:  10/14: pt taking  [place] med (meloxicam 7.5 mg BID                and cyclobenzaprine); Will ask for refill once supply                finished; PCP ok with refill of flexeril on temporary                basis;   01/26/2008: Irritable bladder      Comment:  Seen by Dr Jeraldine Loots 5/09, no incontinence, likely                irritable bladder.  Trial of anticholinergic, refer to                urology, has appt Dr Minna Antis 02/23/08  02/16/2005: Leiomyoma of uterus, unspecified      Comment:  Dr Jeraldine Loots hysterectomy November, 2006; cervix retained,               last pap 11/11 neg Repeat pap/HPV 07/2015   02/15/2014: Mass of hand  07/30/2011: Other plastic surgery for unacceptable cosmetic appearance      Comment:  Breast augmentation scheduled 08/02/11;  Dr.  Ancil Boozer Phone#: [phone]     01/08/2016: Schistosomiasis  08/13/2016: Squamous 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   eyes  06/18/2004: Urinary calculus, unspecified    ROS: Per HPI. Denies headaches, weakness/falls, chest pain, shortness of breath, abdominal pain/nausea/vomiting,  numbness/tingling, fevers or chills.     Medications, allergies, PMH and SH reviewed and updated as necessary in Epic.    O:  No breathlessness  Speaking in full sentences  Normal affect      A/P:  Stephanie [name] is a 58 year old female presents with:    1. Impingement syndrome of right shoulder  Likely diagnosis   AC arthritis last year other shoulder  spo much pain she would like an injection   Reviewed I am in clinic next week and could eval and inject if appropriate  Pt prefers  Recommended PT first or in conjunction  - REFERRAL TO PHYSICAL THERAPY ( INT)  Lemar Livings, MD

## 2019-03-19 ENCOUNTER — Encounter (HOSPITAL_BASED_OUTPATIENT_CLINIC_OR_DEPARTMENT_OTHER): Payer: Self-pay | Admitting: Family Medicine

## 2019-03-19 ENCOUNTER — Ambulatory Visit: Payer: No Typology Code available for payment source | Attending: Family Medicine | Admitting: Family Medicine

## 2019-03-19 ENCOUNTER — Other Ambulatory Visit: Payer: Self-pay

## 2019-03-19 VITALS — BP 128/85 | HR 85 | Temp 98.3°F | Wt 150.0 lb

## 2019-03-19 DIAGNOSIS — M25511 Pain in right shoulder: Secondary | ICD-10-CM | POA: Insufficient documentation

## 2019-03-19 MED ORDER — TRIAMCINOLONE ACETONIDE 40 MG/ML IJ SUSP
20.00 mg | Freq: Once | INTRAMUSCULAR | 0 refills | Status: AC
Start: 2019-03-19 — End: 2019-03-19

## 2019-03-19 NOTE — Progress Notes (Signed)
BP 128/85    Pulse 85    Temp 98.3 F (36.8 C) (Temporal)    Wt 68 kg (150 lb)    LMP 07/11/2005    SpO2 98%    BMI 30.30 kg/m     S:    Stephanie [name] is a 58 year old female who presents with:    Fu for shoulder pain   When points to pain more at deltoid   Can't move arm well   Very hard to sleep at night  No weakness but pain sig    Past Medical History:  02/12/2016: Chronic right shoulder pain  No date: Depression  03/20/2012: Diverticulosis of colon      Comment:  Noted on colonoscopy 6/13   08/16/2010: Elevated hemoglobin A1c      Comment:  HEMOGLOBIN A1C (%) Date Value 03/06/2015 5.6                05/20/2014 5.4 05/08/2012 6.0 (H)   No results found for:               POCA1C   11/21/2012: Hyperopia with astigmatism and presbyopia  11/21/2012: Immature cataract  01/21/2012: Impingement syndrome of right shoulder      Comment:  10/14: pt taking  [place] med (meloxicam 7.5 mg BID                and cyclobenzaprine); Will ask for refill once supply                finished; PCP ok with refill of flexeril on temporary                basis;   01/26/2008: Irritable bladder      Comment:  Seen by Dr Jeraldine Loots 5/09, no incontinence, likely                irritable bladder.  Trial of anticholinergic, refer to                urology, has appt Dr Minna Antis 02/23/08  02/16/2005: Leiomyoma of uterus, unspecified      Comment:  Dr Jeraldine Loots hysterectomy November, 2006; cervix retained,               last pap 11/11 neg Repeat pap/HPV 07/2015   02/15/2014: Mass of hand  07/30/2011: Other plastic surgery for unacceptable cosmetic appearance      Comment:  Breast augmentation scheduled 08/02/11;  Dr.  Ancil Boozer Phone#: [phone]     01/08/2016: Schistosomiasis  08/13/2016: Squamous 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   eyes  06/18/2004: Urinary calculus, unspecified    ROS: Per HPI. Denies headaches, weakness/falls, chest pain, shortness of breath, abdominal pain/nausea/vomiting, numbness/tingling, fevers or  chills.     Medications, allergies, PMH and SH reviewed and updated as necessary in Epic.    O:  PHYSICAL EXAM:  BP 128/85    Pulse 85    Temp 98.3 F (36.8 C) (Temporal)    Wt 68 kg (150 lb)    LMP 07/11/2005    SpO2 98%    BMI 30.30 kg/m   GEN: NAD  HEENT: MMM  SKIN: WWP  SHOULDER: Normal ROM, pain with full flexion, pos empty can test, pain to palpation deltoid   CHEST: Breathing comfortably  NEURO: No focal deficits noted, alert  MSK: Normal gait observed  PSYCH: Appears stated age, appropriate grooming  and dress, appropriate affect and mood congruent    A/P:  Stephanie [name] is a 58 year old female presents with:    1. Acute pain of right shoulder  Discussed with patient not a classic impingement picture   Wondering if deltoid pain could be referred pain   Reviewed PT still hasn't gotten in touch with her   Discussed unclear if injection will help but I am willing to try since she is here  She requests injection    Procedure: Subacromial shoulder injection  Materials: 25 gauge, 1.5 inches, 7 mL of 1% lidocaine, 1 mL methylprednisolone, 40 mg/mL  After discussing risks and benefits of injection aseptic technique was used before inserting the needle just inferior to the posterolateral edge of the acromion and directed toward the opposite nipple. Flow was free into the space without any resistance or significant discomfort to the patient. Post injection instructions were given.    - triamcinolone acetonide (KENALOG) 40 MG/ML injection; Inject 0.5 mLs into the muscle once  for 1 dose  Dispense: 5 mL; Refill: 0  - TRIAMCINOLONE ACETONIDE INJ-PER 10MG  (KENALOG)  - PR ARTHROCENTESIS ASPIR&/INJ MAJOR JT/BURSA W/O [place]          Health Maintenance:  Reviewed HM    Lemar Livings, MD

## 2019-04-04 MED FILL — TRAZODONE  50MG: 30 days supply | Qty: 30 | Fill #4 | Status: CP

## 2019-04-04 MED FILL — OMEPRAZOLE 20MG: 30 days supply | Qty: 30 | Fill #4 | Status: CP

## 2019-04-04 MED FILL — HYDROCHLOROT 25MG: 30 days supply | Qty: 30 | Fill #4 | Status: CP

## 2019-04-05 ENCOUNTER — Other Ambulatory Visit (HOSPITAL_BASED_OUTPATIENT_CLINIC_OR_DEPARTMENT_OTHER): Payer: Self-pay | Admitting: Family Medicine

## 2019-04-05 DIAGNOSIS — M79601 Pain in right arm: Secondary | ICD-10-CM

## 2019-04-05 DIAGNOSIS — F339 Major depressive disorder, recurrent, unspecified: Secondary | ICD-10-CM

## 2019-04-05 MED ORDER — DULOXETINE HCL 30 MG PO CPEP
30.0000 mg | ORAL_CAPSULE | Freq: Every day | ORAL | 1 refills | Status: DC
Start: 2019-04-05 — End: 2019-06-13

## 2019-04-05 MED FILL — DULOXETINE 30MG: 30 days supply | Qty: 30 | Fill #0 | Status: CP

## 2019-04-05 NOTE — Progress Notes (Signed)
[facility]    Person calling on behalf of patient: Pharmacy    May list multiple medications in this section    Medicine Name: DULoxetine (CYMBALTA) 30 MG capsule    Documented patient preferred pharmacies:   [facility], [place]  Phone: [phone] Fax: [phone]      Patient's language of care: [place] ([place]    Patient needs a [place] interpreter.

## 2019-04-05 NOTE — Progress Notes (Signed)
PER Pharmacy, Stephanie [name] is a 59 year old female has requested a refill of DULoxetine (CYMBALTA) 30 MG capsule.      Last Office Visit: 03/19/2019 with Katy Apo, A  Last Physical Exam: 02/21/2013      Other Med Adult:  Most Recent BP Reading(s)  03/19/19 : 128/85        Cholesterol (mg/dL)   Date Value   02/06/2016 210     LOW DENSITY LIPOPROTEIN DIRECT (mg/dL)   Date Value   02/06/2016 112     HIGH DENSITY LIPOPROTEIN (mg/dL)   Date Value   02/06/2016 71     TRIGLYCERIDES (mg/dl)   Date Value   01/20/2009 88         THYROID SCREEN TSH REFLEX FT4 (uIU/mL)   Date Value   02/06/2016 0.785         TSH (THYROID STIM HORMONE) (uIU/mL)   Date Value   12/10/2016 0.790       HEMOGLOBIN A1C (%)   Date Value   03/06/2015 5.6       No results found for: POCA1C      INR (no units)   Date Value   04/22/2008 1.0 (L)   02/07/2005 1.0 (L)       SODIUM (mmol/L)   Date Value   09/21/2017 135 (L)       POTASSIUM (mmol/L)   Date Value   09/21/2017 3.9           CREATININE (mg/dL)   Date Value   09/21/2017 0.7       Documented patient preferred pharmacies:    [facility], [place] - [place]  Phone: [phone] Fax: [phone]

## 2019-04-18 ENCOUNTER — Encounter (HOSPITAL_BASED_OUTPATIENT_CLINIC_OR_DEPARTMENT_OTHER): Payer: No Typology Code available for payment source | Admitting: Physical Medicine & Rehabilitation

## 2019-04-26 ENCOUNTER — Telehealth (HOSPITAL_BASED_OUTPATIENT_CLINIC_OR_DEPARTMENT_OTHER): Payer: Self-pay | Admitting: Physical Medicine & Rehabilitation

## 2019-04-26 NOTE — Progress Notes (Signed)
unable to reach patient left a voicemail Via Interpreter Covid screen not done

## 2019-04-27 ENCOUNTER — Ambulatory Visit (HOSPITAL_BASED_OUTPATIENT_CLINIC_OR_DEPARTMENT_OTHER): Payer: No Typology Code available for payment source | Admitting: Physical Medicine & Rehabilitation

## 2019-04-27 ENCOUNTER — Encounter (HOSPITAL_BASED_OUTPATIENT_CLINIC_OR_DEPARTMENT_OTHER): Payer: Self-pay | Admitting: Physical Medicine & Rehabilitation

## 2019-04-27 ENCOUNTER — Other Ambulatory Visit: Payer: Self-pay

## 2019-04-27 ENCOUNTER — Ambulatory Visit
Admission: RE | Admit: 2019-04-27 | Discharge: 2019-04-27 | Disposition: A | Discharge status: Home / Self Care | Payer: No Typology Code available for payment source | Source: Ambulatory Visit | Attending: Physical Medicine & Rehabilitation | Admitting: Physical Medicine & Rehabilitation

## 2019-04-27 ENCOUNTER — Telehealth (HOSPITAL_BASED_OUTPATIENT_CLINIC_OR_DEPARTMENT_OTHER): Payer: Self-pay | Admitting: Physical Medicine & Rehabilitation

## 2019-04-27 VITALS — BP 132/91 | HR 83 | Temp 97.7°F | Ht 59.0 in | Wt 149.0 lb

## 2019-04-27 DIAGNOSIS — M7581 Other shoulder lesions, right shoulder: Secondary | ICD-10-CM | POA: Diagnosis not present

## 2019-04-27 DIAGNOSIS — M19011 Primary osteoarthritis, right shoulder: Secondary | ICD-10-CM | POA: Diagnosis present

## 2019-04-27 NOTE — Progress Notes (Signed)
Covid Screen 04/27/19

## 2019-04-27 NOTE — Progress Notes (Signed)
CC: Patient presents with:  Physiatry: New Consult- Pain in both upper extremities        Stephanie [name] is being seen in consultation at the request of Lemar Livings, MD for right arm weakness.  History is obtained with the help of [place] interpreter via telephone.      HPI: Stephanie [name] is a pleasant 57 year old woman with past medical history significant for alcohol abuse who presented with acute on chronic right shoulder pain.  She describes pain mostly along the proximal right upper limb.  She describes difficulty with overhead activities.  She denies any trauma or injury at onset.  She was seen by Dr. Kennyth Lose for similar pain a year ago.  At the time she presented with weakness involving the upper limb.  Electrodiagnostic evaluation was performed by Dr. Henrietta Hoover.  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  But no evidence of cervical radiculopathy or brachial plexopathy or peripheral neuropathy.  She was recently seen by Dr. Orie Fisherman and had a right subacromial steroid injection in July of this year.  She reports significant improvement of her pain since the injection.  She was referred to physical therapy she has not yet done so.    ROS: She denies fever, night sweats, chills, nausea, vomiting, recurrent cough or pneumonia or significant weight change last few months.  Please refer to spine questionnaire for more detail review of system.    PMH: Past Medical History:  02/12/2016: Chronic right shoulder pain  No date: Depression  03/20/2012: Diverticulosis of colon      Comment:  Noted on colonoscopy 6/13   08/16/2010: Elevated hemoglobin A1c      Comment:  HEMOGLOBIN A1C (%) Date Value 03/06/2015 5.6                05/20/2014 5.4 05/08/2012 6.0 (H)   No results found for:               POCA1C   11/21/2012: Hyperopia with astigmatism and presbyopia  11/21/2012: Immature cataract  01/21/2012: Impingement syndrome of right shoulder      Comment:  10/14: pt taking   [place] med (meloxicam 7.5 mg BID                and cyclobenzaprine); Will ask for refill once supply                finished; PCP ok with refill of flexeril on temporary                basis;   01/26/2008: Irritable bladder      Comment:  Seen by Dr Jeraldine Loots 5/09, no incontinence, likely                irritable bladder.  Trial of anticholinergic, refer to                urology, has appt Dr Minna Antis 02/23/08  02/16/2005: Leiomyoma of uterus, unspecified      Comment:  Dr Jeraldine Loots hysterectomy November, 2006; cervix retained,               last pap 11/11 neg Repeat pap/HPV 07/2015   02/15/2014: Mass of hand  07/30/2011: Other plastic surgery for unacceptable cosmetic appearance      Comment:  Breast augmentation scheduled 08/02/11;  Dr.  Ancil Boozer Phone#: [phone]  01/08/2016: Schistosomiasis  08/13/2016: Squamous 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   eyes  06/18/2004: Urinary calculus, unspecified    Surgical HX: Past Surgical History:  No date: BREAST ENHANCEMENT SURGERY      Comment:  12/12 had bilateral mammoplasty and saline implants, had               2nd surgery 5/30 for revision  No date: MAMMAPLASTY AUGMENTATION W/PROSTHETIC IMPLANT  No date: MASTOPEXY  No date: OB ANTEPARTUM CARE CESAREAN DLVR & POSTPARTUM      Comment:  x3  No date: REDUCTION MAMMAPLASTY  No date: TOTAL ABDOMINAL HYSTERECT W/WO RMVL TUBE OVARY      Comment:  for fibroids, still has cervix    SH:   Social History     Socioeconomic History    Marital status: Divorced     Spouse name: Not on file    Number of children: Not on file    Years of education: Not on file    Highest education level: Not on file   Occupational History    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; 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   Social Needs    Emergency planning/management officer strain: Not on file    Food insecurity:     Worry: Not on file     Inability: Not on file    Transportation needs:     Medical: Not on file     Non-medical: Not on file   Tobacco  Use    Smoking status: Former Smoker     Types: Cigarettes     Last attempt to quit: 07/27/2001     Years since quitting: 17.7    Smokeless tobacco: Never Used   Substance and Sexual Activity    Alcohol use: Yes     Alcohol/week: 16.7 standard drinks     Comment: occasional, weekends    Drug use: No    Sexual activity: Not Currently     Partners: Male     Comment: G3P3, no hx STD, no hx abn Pap   Lifestyle    Physical activity:     Days per week: Not on file     Minutes per session: Not on file    Stress: Not on file   Relationships    Social connections:     Talks on phone: Not on file     Gets together: Not on file     Attends religious service: Not on file     Active member of club or organization: Not on file     Attends meetings of clubs or organizations: Not on file     Relationship status: Not on file    Intimate partner violence:     Fear of current or ex partner: Not on file     Emotionally abused: Not on file     Physically abused: Not on file     Forced sexual activity: Not on file   Other Topics Concern    Not on file   Social History Narrative    Not on file       Allergies: Review of Patient's Allergies indicates:  No Known Allergies    Current Medications:   Current Outpatient Medications:     DULoxetine (CYMBALTA) 30 MG capsule, Take 1 capsule by mouth daily, Disp: 30 capsule, Rfl: 1    hydrochlorothiazide (HYDRODIURIL) 25 MG tablet, Take 1 tablet by mouth daily, Disp: 30 tablet, Rfl: 11  omeprazole (PRILOSEC) 20 MG capsule, Take 1 capsule by mouth daily, Disp: 30 capsule, Rfl: 10    traZODone (DESYREL) 50 MG tablet, Take 1 tablet by mouth nightly, Disp: 30 tablet, Rfl: 11    meloxicam (MOBIC) 7.5 MG tablet, Take 1 tablet by mouth daily, Disp: , Rfl:     Re: past history, social history, meds and allergies, see also scanned New Patient form for additional info provided by patient    Physical Exam    04/27/19  1425   BP: (!) 132/91   Pulse: 83   Temp: 97.7 F (36.5 C)   SpO2: 98%    Weight: 67.6 kg (149 lb)   Height: 4\' 11"  (1.499 m)       General:   Pleasant 58 year old woman who is alert and oriented in time, person and place with appropriate mood and affect.     Gait:  Normal gait.  Able to rise to toe and heels without difficulty.  Manual Strength Testing:   Normal strength in both upper and lower limbs without tone abnormality.  There is however a slight giveaway weakness along the right proximal upper limb.  Reflexes:  Normal and symmetric in both upper and lower limbs.  Plantar  reflexes were flexor bilaterally.    SENSORY: Sensory examination was grossly unremarkable.   PROM/PROVOCATIVE MANEUVER:  Shoulder: Full passive range of motion of the shoulders bilaterally.  There is some pain reproduction beyond 90 degrees of abduction of the right shoulder with active range of motion.  She is tender along the right AC joint.  There is positive empty can and Neer test of the right shoulder.  Left shoulder was unremarkable.  Tinel test of the median nerve was negative bilaterally.  SPINE:  Normal curvature of the cervical, thoracic and lumbar spine with no tenderness along the lumbar and lumbosacral paraspinous muscles.  No pain reproduction with flexion or extension of the lumbar spine.    Negative Spurling maneuver bilaterally  PALPATORY:  No tenderness appreciated other than above stated.  No gross appendicular defect.    PERIPHERAL VASCULAR: No edema, increased warmth appreciated.   SKIN: Grossly unremarkable in the upper and lower limbs and spine.        Imaging - Available images visualized by me and reports reviewed.   Right shoulder x-ray was ordered today.    A/P: This is a pleasant right-handed 58 year old woman who present with clinical folic acid with right rotator cuff tendinitis and possible impingement syndrome.  She responded well to a recent subacromial steroid injection with Dr. Katy Apo.  I agree with the recommendation of physical therapy.  If pain does not improve, an MRI of  the right shoulder may be warranted along with a referral to orthopedic surgery.  There was no convincing objective evidence of nerve impingement at the cervical spine.  Rather the perceived weakness was secondary to her shoulder pain.    At this point I did not arrange for follow-up visit whether she will contact me as needed.    Carron Brazen, MD, 04/27/2019

## 2019-05-08 MED FILL — HYDROCHLOROT 25MG: 30 days supply | Qty: 30 | Fill #5 | Status: CP

## 2019-05-08 MED FILL — DULOXETINE 30MG: 30 days supply | Qty: 30 | Fill #1 | Status: CP

## 2019-05-08 MED FILL — OMEPRAZOLE 20MG: 30 days supply | Qty: 30 | Fill #5 | Status: CP

## 2019-05-08 MED FILL — TRAZODONE  50MG: 30 days supply | Qty: 30 | Fill #5 | Status: CP

## 2019-06-03 ENCOUNTER — Encounter (HOSPITAL_BASED_OUTPATIENT_CLINIC_OR_DEPARTMENT_OTHER): Payer: Self-pay

## 2019-06-03 ENCOUNTER — Other Ambulatory Visit: Payer: Self-pay

## 2019-06-03 ENCOUNTER — Emergency Department (HOSPITAL_BASED_OUTPATIENT_CLINIC_OR_DEPARTMENT_OTHER): Payer: No Typology Code available for payment source

## 2019-06-03 ENCOUNTER — Emergency Department
Admission: EM | Admit: 2019-06-03 | Discharge: 2019-06-03 | Disposition: A | Discharge status: Home / Self Care | Payer: No Typology Code available for payment source | Source: Intra-hospital | Attending: Emergency Medicine | Admitting: Emergency Medicine

## 2019-06-03 DIAGNOSIS — Y93E5 Activity, floor mopping and cleaning: Secondary | ICD-10-CM

## 2019-06-03 DIAGNOSIS — R0781 Pleurodynia: Secondary | ICD-10-CM | POA: Diagnosis not present

## 2019-06-03 DIAGNOSIS — R079 Chest pain, unspecified: Secondary | ICD-10-CM | POA: Diagnosis not present

## 2019-06-03 DIAGNOSIS — Z87891 Personal history of nicotine dependence: Secondary | ICD-10-CM

## 2019-06-03 DIAGNOSIS — X509XXA Other and unspecified overexertion or strenuous movements or postures, initial encounter: Secondary | ICD-10-CM | POA: Diagnosis present

## 2019-06-03 DIAGNOSIS — R0789 Other chest pain: Secondary | ICD-10-CM

## 2019-06-03 DIAGNOSIS — Y92009 Unspecified place in unspecified non-institutional (private) residence as the place of occurrence of the external cause: Secondary | ICD-10-CM

## 2019-06-03 DIAGNOSIS — S2232XA Fracture of one rib, left side, initial encounter for closed fracture: Secondary | ICD-10-CM

## 2019-06-03 DIAGNOSIS — S29019A Strain of muscle and tendon of unspecified wall of thorax, initial encounter: Secondary | ICD-10-CM | POA: Diagnosis not present

## 2019-06-03 DIAGNOSIS — T148XXA Other injury of unspecified body region, initial encounter: Secondary | ICD-10-CM

## 2019-06-03 MED ORDER — KETOROLAC TROMETHAMINE 30 MG/ML INJ
15.00 mg | Freq: Once | Status: AC
Start: 2019-06-03 — End: 2019-06-03
  Administered 2019-06-03: 15 mg via INTRAMUSCULAR
  Filled 2019-06-03: qty 1

## 2019-06-03 MED ORDER — ACETAMINOPHEN 325 MG PO TABS
325.0000 mg | ORAL_TABLET | Freq: Four times a day (QID) | ORAL | 0 refills | Status: AC | PRN
Start: 2019-06-03 — End: 2019-07-03

## 2019-06-03 MED ORDER — LIDOCAINE 5 % EX PTCH
1.00 | MEDICATED_PATCH | CUTANEOUS | 0 refills | Status: AC
Start: 2019-06-03 — End: 2019-06-10

## 2019-06-03 MED ORDER — NAPROXEN 500 MG PO TABS
500.0000 mg | ORAL_TABLET | Freq: Two times a day (BID) | ORAL | 0 refills | Status: DC | PRN
Start: 2019-06-03 — End: 2019-09-13

## 2019-06-03 MED ORDER — ACETAMINOPHEN 500 MG PO TABS
1000.00 mg | ORAL_TABLET | Freq: Once | ORAL | Status: AC
Start: 2019-06-03 — End: 2019-06-03
  Administered 2019-06-03: 1000 mg via ORAL
  Filled 2019-06-03: qty 2

## 2019-06-03 NOTE — ED Triage Note (Signed)
Self referred with 2 days of left sided point tenderness in chest and left breast. Last dose of advil yesterday.

## 2019-06-03 NOTE — ED Provider Notes (Signed)
The patient was seen primarily by me. ED nursing record was reviewed. Prior records as available electronically through the Epic record were reviewed.  Care language: [place] ([place]); Interpreter used.         HPI:    This is a 58 year old female patient presenting with left inferior, anterior rib pain in the setting of housework a few days ago.  Patient reports that she was doing heavy housecleaning, she was bending over and reaching for things frequently, when she felt sharp pain in her left lower rib.  This occurred on 9/30.  Since then she has been taking Motrin and Tylenol, however without significant relief.  She reports she has severe pain whenever she touches the area, and with certain movements.  She denies associated shortness of breath, nausea, abdominal pain, diaphoresis, dizziness, syncope, exertional symptoms, or other complaints.  He endorses a history of rib fracture many years ago, reports that it feels similar.      ROS: Pertinent positives were reviewed as per the HPI above. All other systems were reviewed and are negative.      Past Medical History/Problem list:  Past Medical History:  02/12/2016: Chronic right shoulder pain  No date: Depression  03/20/2012: Diverticulosis of colon      Comment:  Noted on colonoscopy 6/13   08/16/2010: Elevated hemoglobin A1c      Comment:  HEMOGLOBIN A1C (%) Date Value 03/06/2015 5.6                05/20/2014 5.4 05/08/2012 6.0 (H)   No results found for:               POCA1C   11/21/2012: Hyperopia with astigmatism and presbyopia  11/21/2012: Immature cataract  01/21/2012: Impingement syndrome of right shoulder      Comment:  10/14: pt taking  [place] med (meloxicam 7.5 mg BID                and cyclobenzaprine); Will ask for refill once supply                finished; PCP ok with refill of flexeril on temporary                basis;   01/26/2008: Irritable bladder      Comment:  Seen by Dr Jeraldine Loots 5/09, no incontinence, likely                irritable  bladder.  Trial of anticholinergic, refer to                urology, has appt Dr Minna Antis 02/23/08  02/16/2005: Leiomyoma of uterus, unspecified      Comment:  Dr Jeraldine Loots hysterectomy November, 2006; cervix retained,               last pap 11/11 neg Repeat pap/HPV 07/2015   02/15/2014: Mass of hand  07/30/2011: Other plastic surgery for unacceptable cosmetic appearance      Comment:  Breast augmentation scheduled 08/02/11;  Dr.  Ancil Boozer Phone#: [phone]     01/08/2016: Schistosomiasis  08/13/2016: Squamous 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   eyes  06/18/2004: Urinary calculus, unspecified  Patient Active Problem List:     Class 1 obesity due to excess calories without serious comorbidity with body mass index (BMI) of 32.0 to 32.9 in adult     Pure hypercholesterolemia  Family history of malignant neoplasm of gastrointestinal tract     Tubular adenoma of colon     Hyperopia with astigmatism and presbyopia     Immature cataract     Hypertension, goal below 140/90     Adenomatous polyp of colon     Squamous 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 eyes     Alcohol abuse     Depression, recurrent ([place])     Trauma and stressor-related disorder        Past Surgical History: Past Surgical History:  No date: BREAST ENHANCEMENT SURGERY      Comment:  12/12 had bilateral mammoplasty and saline implants, had               2nd surgery 5/30 for revision  No date: MAMMAPLASTY AUGMENTATION W/PROSTHETIC IMPLANT  No date: MASTOPEXY  No date: OB ANTEPARTUM CARE CESAREAN DLVR & POSTPARTUM      Comment:  x3  No date: REDUCTION MAMMAPLASTY  No date: TOTAL ABDOMINAL HYSTERECT W/WO RMVL TUBE OVARY      Comment:  for fibroids, still has cervix      Medications:   No current facility-administered medications for this encounter.      Current Outpatient Medications   Medication Sig   . acetaminophen (TYLENOL) 325 MG tablet Take 1-2 tablets by mouth every 6 (six) hours as needed for Pain   . naproxen  (NAPROSYN) 500 MG tablet Take 1 tablet by mouth every 12 (twelve) hours as needed for Pain as needed for pain. Take with food  for up to 10 days   . lidocaine (LIDODERM) 5 % patch Place 1 patch onto the skin daily  for 7 days   . DULoxetine (CYMBALTA) 30 MG capsule Take 1 capsule by mouth daily   . hydrochlorothiazide (HYDRODIURIL) 25 MG tablet Take 1 tablet by mouth daily   . omeprazole (PRILOSEC) 20 MG capsule Take 1 capsule by mouth daily   . traZODone (DESYREL) 50 MG tablet Take 1 tablet by mouth nightly   . meloxicam (MOBIC) 7.5 MG tablet Take 1 tablet by mouth daily         Social History: Social History    Tobacco Use      Smoking status: Former Smoker        Types: Cigarettes        Quit date: 07/27/2001        Years since quitting: 17.8      Smokeless tobacco: Never Used    Alcohol use: Yes      Alcohol/week: 16.7 standard drinks      Comment: occasional, weekends        Allergies:  Review of Patient's Allergies indicates:  No Known Allergies      Physical Exam:  ED Triage Vitals [06/03/19 0811]   ED Triage Vitals Brief Group      Temp 96.9 F      Pulse 89      Resp 18      BP (!) 167/112      SpO2 98 %      Pain Score 7        GENERAL: No acute distress.   SKIN:  Warm & Dry, no rash.  HEAD: Normocephalic, atraumatic. PERRL. EOMI.  Oropharynx: clear.  NECK: No midline tenderness, supple, normal ROM   LUNGS:  Clear to auscultation bilaterally. No wheezes, rales, rhonchi.   CARDIOVASCULAR:  RRR.  Intact distal pulses.   CHEST: +point tenderness L anterior/inferior  rib.   ABDOMEN:  Soft, non-tender.  No guarding or rebound tenderness.   MUSCULOSKELETAL:  No obvious deformities.    NEUROLOGIC: Alert and oriented.  Moves all extremities well.  PSYCHIATRIC:  Appropriate for age, time of day, and situation.      ED Course and Medical Decision-making:    The patient is a  58 year old female with left rib pain, in the setting of heavy housework.  No fracture noted on x-ray, sx likely secondary to muscle strain.   Patient feels improved with Tylenol and Toradol.  Will prescribe Tylenol, naproxen, lidocaine patches.       Additional verbal discharge instructions were provided including patients diagnosis and follow up plan, as well as reasons to return to the Emergency Department which were discussed in detail.  Patient is agreeable with this management.       Condition: Improved and Stable    Disposition:  Discharged to home    Diagnosis/Diagnoses:  Chest wall pain  Muscle strain

## 2019-06-03 NOTE — Narrator Note (Signed)
Patient Disposition  Patient education for diagnosis, medications, activity, diet and follow-up.  Patient left ED 9:49 AM.  Patient rep received written instructions.    Interpreter to provide instructions: No    Patient belongings with patient: YES    Have all existing LDAs been addressed? N/A    Have all IV infusions been stopped? N/A    Destination: Discharged to home

## 2019-06-03 NOTE — Narrator Note (Signed)
Pt sent to X/R.

## 2019-06-04 MED FILL — NAPROXEN 500MG: 10 days supply | Qty: 20 | Fill #0 | Status: CP

## 2019-06-04 MED FILL — ACETAMINOPHE 325MG: 3 days supply | Qty: 15 | Fill #0 | Status: CP

## 2019-06-04 MED FILL — LIDOCAINE PATCH 5%: 7 days supply | Qty: 7 | Fill #0 | Status: CP

## 2019-06-05 ENCOUNTER — Encounter (HOSPITAL_BASED_OUTPATIENT_CLINIC_OR_DEPARTMENT_OTHER): Payer: Self-pay

## 2019-06-05 LAB — EKG

## 2019-06-05 NOTE — Telephone Encounter (Signed)
Called pt with the assistance of the [place] interpreter[place] interpreter # [phone]  Called pt to see how they were feeling since their recent ED visit  Left message asking for a return call for any concerns

## 2019-06-13 ENCOUNTER — Other Ambulatory Visit (HOSPITAL_BASED_OUTPATIENT_CLINIC_OR_DEPARTMENT_OTHER): Payer: Self-pay | Admitting: Family Medicine

## 2019-06-13 DIAGNOSIS — F339 Major depressive disorder, recurrent, unspecified: Secondary | ICD-10-CM

## 2019-06-13 DIAGNOSIS — M79601 Pain in right arm: Secondary | ICD-10-CM

## 2019-06-13 MED ORDER — DULOXETINE HCL 30 MG PO CPEP
30.0000 mg | ORAL_CAPSULE | Freq: Every day | ORAL | 1 refills | Status: DC
Start: 2019-06-13 — End: 2019-10-29

## 2019-06-13 MED FILL — DULOXETINE 30MG: 30 days supply | Qty: 30 | Fill #0

## 2019-06-13 MED FILL — OMEPRAZOLE 20MG: 30 days supply | Qty: 30 | Fill #6 | Status: CP

## 2019-06-13 MED FILL — TRAZODONE  50MG: 30 days supply | Qty: 30 | Fill #6 | Status: CP

## 2019-06-13 MED FILL — HYDROCHLOROT 25MG: 30 days supply | Qty: 30 | Fill #6 | Status: CP

## 2019-06-13 NOTE — Progress Notes (Signed)
Stephanie [name] [id number], 58 year old, female    Calls today:  Refill    !! Before starting refill request, check EPIC to see if encounter for this medication already exists !!    (May list multiple medications in this section)  Medicine Name:     - DULoxetine (CYMBALTA) 30 MG capsule     Documented patient preferred pharmacies:    [facility], [place]  Phone: [phone] Fax: [phone]    Person calling on behalf of patient: Pharmacy    Patient's language of care: [place] Kaplan)    Patient's PCP: Lemar Livings, MD

## 2019-06-13 NOTE — Progress Notes (Signed)
PER Pharmacy, Stephanie [name] is a 58 year old female has requested a refill of duloxetine 30.      Last Office Visit: 03-19-19 with pcp  Last Physical Exam: 02-21-13    There are no preventive care reminders to display for this patient.    Other Med Adult:  Most Recent BP Reading(s)  06/03/19 : (!) 167/112        Cholesterol (mg/dL)   Date Value   02/06/2016 210     LOW DENSITY LIPOPROTEIN DIRECT (mg/dL)   Date Value   02/06/2016 112     HIGH DENSITY LIPOPROTEIN (mg/dL)   Date Value   02/06/2016 71     TRIGLYCERIDES (mg/dl)   Date Value   01/20/2009 88         THYROID SCREEN TSH REFLEX FT4 (uIU/mL)   Date Value   02/06/2016 0.785         TSH (THYROID STIM HORMONE) (uIU/mL)   Date Value   12/10/2016 0.790       HEMOGLOBIN A1C (%)   Date Value   03/06/2015 5.6       No results found for: POCA1C      INR (no units)   Date Value   04/22/2008 1.0 (L)   02/07/2005 1.0 (L)       SODIUM (mmol/L)   Date Value   09/21/2017 135 (L)       POTASSIUM (mmol/L)   Date Value   09/21/2017 3.9           CREATININE (mg/dL)   Date Value   09/21/2017 0.7       Documented patient preferred pharmacies:    [facility] [place] [address] [place]  Phone: [phone] Fax: [phone]

## 2019-06-18 ENCOUNTER — Ambulatory Visit: Payer: No Typology Code available for payment source | Attending: Family Medicine | Admitting: Family Medicine

## 2019-06-18 DIAGNOSIS — K2100 Gastro-esophageal reflux disease with esophagitis, without bleeding: Secondary | ICD-10-CM | POA: Diagnosis present

## 2019-06-18 DIAGNOSIS — R0789 Other chest pain: Secondary | ICD-10-CM | POA: Diagnosis not present

## 2019-06-18 MED ORDER — OMEPRAZOLE 20 MG PO CPDR
40.0000 mg | DELAYED_RELEASE_CAPSULE | Freq: Every day | ORAL | 0 refills | Status: DC
Start: 2019-06-18 — End: 2019-09-13

## 2019-06-18 MED ORDER — POLYETHYLENE GLYCOL 3350 17 G PO PACK
17.00 g | PACK | Freq: Every day | ORAL | 0 refills | Status: AC
Start: 2019-06-18 — End: 2020-06-17

## 2019-06-18 MED FILL — POLYETH GLYC POW 3350 NF: 30 days supply | Qty: 30 | Fill #0

## 2019-06-18 NOTE — Progress Notes (Signed)
Telemedicine Visit    S:    Stephanie [name] is a 58 year old female who complains of the following:     Takes omeprazole  Many years  But burning is worse  Also more constipated   3 months more constipated  3 wks of worse GERD    Endoscopy     Past Medical History:  02/12/2016: Chronic right shoulder pain  No date: Depression  03/20/2012: Diverticulosis of colon      Comment:  Noted on colonoscopy 6/13   08/16/2010: Elevated hemoglobin A1c      Comment:  HEMOGLOBIN A1C (%) Date Value 03/06/2015 5.6                05/20/2014 5.4 05/08/2012 6.0 (H)   No results found for:               POCA1C   11/21/2012: Hyperopia with astigmatism and presbyopia  11/21/2012: Immature cataract  01/21/2012: Impingement syndrome of right shoulder      Comment:  10/14: pt taking  [place] med (meloxicam 7.5 mg BID                and cyclobenzaprine); Will ask for refill once supply                finished; PCP ok with refill of flexeril on temporary                basis;   01/26/2008: Irritable bladder      Comment:  Seen by Dr Jeraldine Loots 5/09, no incontinence, likely                irritable bladder.  Trial of anticholinergic, refer to                urology, has appt Dr Minna Antis 02/23/08  02/16/2005: Leiomyoma of uterus, unspecified      Comment:  Dr Jeraldine Loots hysterectomy November, 2006; cervix retained,               last pap 11/11 neg Repeat pap/HPV 07/2015   02/15/2014: Mass of hand  07/30/2011: Other plastic surgery for unacceptable cosmetic appearance      Comment:  Breast augmentation scheduled 08/02/11;  Dr.  Ancil Boozer Phone#: [phone]     01/08/2016: Schistosomiasis  08/13/2016: Squamous 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   eyes  06/18/2004: Urinary calculus, unspecified    ROS: Per HPI. Denies headaches, weakness/falls, chest pain, shortness of breath, vomiting, numbness/tingling, fevers or chills.     Medications, allergies, PMH and SH reviewed and updated as necessary in Epic.    O:  No breathlessness  Speaking  in full sentences  Normal affect    A/P:  Stephanie [name] is a 58 year old female presents with:    1. Gastroesophageal reflux disease with esophagitis  Reviewed could be setting of constipation   Will increase fiber  2 wks extra tab of PPI per day   miralax   Will also get to GI but hopefully symptoms resolved and won't need endoscopy  Also get hpyliri neg in 0000000- HELICOBACTER PYLORI IGG; Future  - omeprazole (PRILOSEC) 20 MG capsule; Take 2 capsules by mouth daily  for 14 days  Dispense: 14 capsule; Refill: 0  - REFERRAL TO GASTROENTEROLOGY ( INT)    2. Chest wall pain  Improving         Lavonya Hoerner  Rico Junker, MD

## 2019-07-16 MED FILL — TRAZODONE 50MG: 30 days supply | Qty: 30 | Fill #7

## 2019-07-16 MED FILL — OMEPRAZOLE 20MG: 30 days supply | Qty: 30 | Fill #7

## 2019-07-16 MED FILL — DULOXETINE 30MG: 30 days supply | Qty: 30 | Fill #1

## 2019-07-16 MED FILL — HYDROCHLOROT 25MG: 30 days supply | Qty: 30 | Fill #7

## 2019-07-25 ENCOUNTER — Ambulatory Visit: Payer: No Typology Code available for payment source | Attending: Family Medicine | Admitting: Family Medicine

## 2019-07-25 DIAGNOSIS — M7541 Impingement syndrome of right shoulder: Secondary | ICD-10-CM | POA: Diagnosis present

## 2019-07-25 DIAGNOSIS — K2100 Gastro-esophageal reflux disease with esophagitis, without bleeding: Secondary | ICD-10-CM | POA: Diagnosis not present

## 2019-07-25 DIAGNOSIS — R5381 Other malaise: Secondary | ICD-10-CM | POA: Diagnosis not present

## 2019-07-25 DIAGNOSIS — M25562 Pain in left knee: Secondary | ICD-10-CM | POA: Diagnosis present

## 2019-07-25 DIAGNOSIS — R5383 Other fatigue: Secondary | ICD-10-CM | POA: Insufficient documentation

## 2019-07-25 NOTE — Progress Notes (Signed)
Telemedicine Visit    S:    Stephanie [name] is a 58 year old female who complains of the following:     Pins and needles in hands    BP is going up every day     Arm hurts hard to sleep       Past Medical History:  02/12/2016: Chronic right shoulder pain  No date: Depression  03/20/2012: Diverticulosis of colon      Comment:  Noted on colonoscopy 6/13   08/16/2010: Elevated hemoglobin A1c      Comment:  HEMOGLOBIN A1C (%) Date Value 03/06/2015 5.6                05/20/2014 5.4 05/08/2012 6.0 (H)   No results found for:               POCA1C   11/21/2012: Hyperopia with astigmatism and presbyopia  11/21/2012: Immature cataract  01/21/2012: Impingement syndrome of right shoulder      Comment:  10/14: pt taking  [place] med (meloxicam 7.5 mg BID                and cyclobenzaprine); Will ask for refill once supply                finished; PCP ok with refill of flexeril on temporary                basis;   01/26/2008: Irritable bladder      Comment:  Seen by Dr Jeraldine Loots 5/09, no incontinence, likely                irritable bladder.  Trial of anticholinergic, refer to                urology, has appt Dr Minna Antis 02/23/08  02/16/2005: Leiomyoma of uterus, unspecified      Comment:  Dr Jeraldine Loots hysterectomy November, 2006; cervix retained,               last pap 11/11 neg Repeat pap/HPV 07/2015   02/15/2014: Mass of hand  07/30/2011: Other plastic surgery for unacceptable cosmetic appearance      Comment:  Breast augmentation scheduled 08/02/11;  Dr.  Ancil Boozer Phone#: [phone]     01/08/2016: Schistosomiasis  08/13/2016: Squamous 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   eyes  06/18/2004: Urinary calculus, unspecified    ROS: Per HPI. Denies headaches, weakness/falls, chest pain, shortness of breath, abdominal pain/nausea/vomiting, numbness/tingling, fevers or chills.     Medications, allergies, PMH and SH reviewed and updated as necessary in Epic.    O:  No breathlessness  Speaking in full sentences  Normal  affect    A/P:  Stephanie [name] is a 58 year old female presents with:    1. Gastroesophageal reflux disease with esophagitis, unspecified whether hemorrhage  Still needing testing     2. Malaise and fatigue  Recheck CBC  - CBC WITH PLATELET; Future  - FERRITIN; Future    3. Impingement syndrome of right shoulder  Reviewed physiatry recs for MRI given pain still back   Reviewed ortho appt for possible repeat injection and eval - reviewed likley pain came back because wasn't able to make pt appt  - REFERRAL TO ORTHOPEDICS ( INT)  - MRI SHOULDER RIGHT WO CONTRAST; Future    4. Pain in lateral portion of left knee  Likely arthritis - pt  requesting trial corticosteroid injection   scheduling        Lemar Livings, MD

## 2019-07-30 ENCOUNTER — Ambulatory Visit
Admission: RE | Admit: 2019-07-30 | Discharge: 2019-07-30 | Disposition: A | Discharge status: Home / Self Care | Payer: No Typology Code available for payment source | Attending: Family Medicine | Admitting: Family Medicine

## 2019-07-30 ENCOUNTER — Other Ambulatory Visit (HOSPITAL_BASED_OUTPATIENT_CLINIC_OR_DEPARTMENT_OTHER): Payer: Self-pay | Admitting: Family Medicine

## 2019-07-30 ENCOUNTER — Other Ambulatory Visit: Payer: Self-pay

## 2019-07-30 DIAGNOSIS — M7541 Impingement syndrome of right shoulder: Secondary | ICD-10-CM | POA: Insufficient documentation

## 2019-07-30 DIAGNOSIS — M75120 Complete rotator cuff tear or rupture of unspecified shoulder, not specified as traumatic: Secondary | ICD-10-CM

## 2019-07-30 DIAGNOSIS — M75121 Complete rotator cuff tear or rupture of right shoulder, not specified as traumatic: Secondary | ICD-10-CM

## 2019-07-31 ENCOUNTER — Telehealth (HOSPITAL_BASED_OUTPATIENT_CLINIC_OR_DEPARTMENT_OTHER): Payer: Self-pay

## 2019-07-31 NOTE — Telephone Encounter (Signed)
Called pt with the assistance of the portuguese interpreter-Kelly  MRI shoulder +complete tear of rotator cuff f/u with ortho [phone]    Left message asking for a return call to clinic to discuss MRI results    Orie Fisherman, MD  [facility]              Please let her know full thickness tear - ref to ortho in place - can be treated conservatively sometimes I believe.   THanks

## 2019-07-31 NOTE — Telephone Encounter (Signed)
Pt returning my call with the assistance of the portuguese interpreter-Ellen  Pt advised of complete tear of rotator cuff and ortho referral  Ortho number provided  Pt states that she is not in too much pain "only sometimes"

## 2019-08-03 ENCOUNTER — Other Ambulatory Visit: Payer: Self-pay

## 2019-08-03 ENCOUNTER — Encounter (HOSPITAL_BASED_OUTPATIENT_CLINIC_OR_DEPARTMENT_OTHER): Payer: Self-pay | Admitting: Orthopaedic Surgery

## 2019-08-03 ENCOUNTER — Ambulatory Visit: Payer: No Typology Code available for payment source | Attending: Orthopaedic Surgery | Admitting: Orthopaedic Surgery

## 2019-08-03 VITALS — BP 122/84 | HR 88 | Ht 59.25 in | Wt 150.0 lb

## 2019-08-03 DIAGNOSIS — M75121 Complete rotator cuff tear or rupture of right shoulder, not specified as traumatic: Secondary | ICD-10-CM | POA: Diagnosis not present

## 2019-08-03 NOTE — Progress Notes (Signed)
Surgery Admission H&P Note     Diagnosis: (M75.121) Nontraumatic complete tear of right rotator cuff    HPI:   Patient is a 58 year old right-hand-dominant female here today for evaluation of atraumatic acute on chronic right shoulder pain. To review, pain initially began roughly 7 years ago. MRI at that time showed supraspinatus tendinopathy with a high grade bursal surface tear measuring 1.2 cm,  AC joint with moderate hypertrophic changes. She was initially scheduled for right shoulder arthroscopy, rotator cuff repair, mumford procedure and partial acromioplasty with Dr. Kennyth Lose but canceled the surgery because she was traveling. She reports that she received PT after that time with significant improvement in her R shoulder pain and ROM. In July 2020, she was seen by her PCP and received a right subacromial steroid injection with significant improvement in her pain. She was seen in August 2020 by physiatry who recommended that she restart PT and if her pain didn't improve to obtain an MRI of the right shoulder. MRI of the right shoulder in November 2020 showed a full-thickness retracted supraspinatus tear.     Her pain is localized to the right shoulder and upper arm. Describes as a nagging pain. Today pain is a 5/10. Pain is exacerbated with all movements, improves with meloxicam and rest. She is currently taking the meloxicam 1-2 times per week. No history of previous surgeries or injuries to the affected shoulder. Endorses intermittent distal numbness or tingling. Patient works as a Education administrator lady.   Associated symptoms- no locking; swelling; no fevers/chills/SOB     Past Medical History:   Past Medical History:  02/12/2016: Chronic right shoulder pain  No date: Depression  03/20/2012: Diverticulosis of colon      Comment:  Noted on colonoscopy 6/13   08/16/2010: Elevated hemoglobin A1c      Comment:  HEMOGLOBIN A1C (%) Date Value 03/06/2015 5.6                05/20/2014 5.4 05/08/2012 6.0 (H)   No results found  for:               POCA1C   11/21/2012: Hyperopia with astigmatism and presbyopia  No date: Hypertension  11/21/2012: Immature cataract  01/21/2012: Impingement syndrome of right shoulder      Comment:  10/14: pt taking  [place] med (meloxicam 7.5 mg BID                and cyclobenzaprine); Will ask for refill once supply                finished; PCP ok with refill of flexeril on temporary                basis;   01/26/2008: Irritable bladder      Comment:  Seen by Dr Jeraldine Loots 5/09, no incontinence, likely                irritable bladder.  Trial of anticholinergic, refer to                urology, has appt Dr Minna Antis 02/23/08  02/16/2005: Leiomyoma of uterus, unspecified      Comment:  Dr Jeraldine Loots hysterectomy November, 2006; cervix retained,               last pap 11/11 neg Repeat pap/HPV 07/2015   02/15/2014: Mass of hand  07/30/2011: Other plastic surgery for unacceptable cosmetic appearance      Comment:  Breast augmentation scheduled 08/02/11;  Dr.  Ancil Boozer Phone#[phone]     01/08/2016: Schistosomiasis  08/13/2016: Squamous 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   eyes  06/18/2004: Urinary calculus, unspecified    Surgical History:   Past Surgical History:  No date: BREAST ENHANCEMENT SURGERY      Comment:  12/12 had bilateral mammoplasty and saline implants, had               2nd surgery 5/30 for revision  No date: MAMMAPLASTY AUGMENTATION W/PROSTHETIC IMPLANT  No date: MASTOPEXY  No date: OB ANTEPARTUM CARE CESAREAN DLVR & POSTPARTUM      Comment:  x3  No date: REDUCTION MAMMAPLASTY  No date: TOTAL ABDOMINAL HYSTERECT W/WO RMVL TUBE OVARY      Comment:  for fibroids, still has cervix    Family History:   Review of patient's family history indicates:  Problem: Cancer - Other      Relation: Father          Age of Onset: (Not Specified)          Comment: stomach  Problem: Heart      Relation: Brother          Age of Onset: (Not Specified)          Comment: died at 20 during valve  replacement  Problem: Heart      Relation: Brother          Age of Onset: (Not Specified)          Comment: chestpain with neg w/u  Problem: Heart      Relation: Mother          Age of Onset: (Not Specified)          Comment: Died of MI age 63  Problem: Diabetes      Relation: Mother          Age of Onset: (Not Specified)    Social History:   Social History     Socioeconomic History    Marital status: Divorced     Spouse name: Not on file    Number of children: Not on file    Years of education: Not on file    Highest education level: Not on file   Occupational History    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; 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   Social Needs    Financial resource strain: Not on file    Food insecurity     Worry: Not on file     Inability: Not on file    Transportation needs     Medical: Not on file     Non-medical: Not on file   Tobacco Use    Smoking status: Former Smoker     Types: Cigarettes     Last attempt to quit: 07/27/2001     Years since quitting: 18.0    Smokeless tobacco: Never Used   Substance and Sexual Activity    Alcohol use: Yes     Alcohol/week: 6.0 standard drinks     Types: 6 Cans of beer per week     Comment: 6 beers per week     Drug use: No    Sexual activity: Not Currently     Partners: Male     Comment: G3P3, no hx STD, no hx abn Pap   Lifestyle    Physical activity     Days  per week: Not on file     Minutes per session: Not on file    Stress: Not on file   Relationships    Social connections     Talks on phone: Not on file     Gets together: Not on file     Attends religious service: Not on file     Active member of club or organization: Not on file     Attends meetings of clubs or organizations: Not on file     Relationship status: Not on file    Intimate partner violence     Fear of current or ex partner: Not on file     Emotionally abused: Not on file     Physically abused: Not on file     Forced sexual activity: Not on file   Other Topics Concern    Not on file    Social History Narrative    08/03/19 Divorced, has 3 children, lives alone, works as a Education administrator lady      Allergies:   Review of Patient's Allergies indicates:  No Known Allergies    Medications:   Duloxetine, meloxicam, trazodone     Review of Systems: 11 systems reviewed and noted in the new patient form scanned into the Epic record for this date of service.    Physical Exam:  Vital Signs: BP 122/84    Pulse 88    Ht 4' 11.25" (1.505 m)    Wt 68 kg (150 lb)    LMP 07/11/2005    SpO2 95%    BMI 30.04 kg/m   General Appearance: Clean, well dressed, well groomed, in NAD sitting in a chair   Neuro: Alert and oriented x3, balance and coordination grossly normal   Psych: Euthymic, cooperative  Eyes: Pupils round and reactive.  Breathing/Respiratory: Non-labored.  Musculoskeletal:   Left Upper Extremity: No skin changes. Normal with regards to inspection, palpation, ROM at the elbow and wrist, stability, and strength. All tendons grossly intact and functioning, Sensation & vascular intact distally.     Right Upper Extremity: No skin changes. Normal with regards to inspection. Tender to palpation over the bicipital grove, Passive ROM > Active ROM. Full ROM at the elbow and wrist. Shoulder does not sublux on exam with gentle PROM. 4/5 strength at shoulder in all planes of movement. Full strength at elbow and wrist. Sensation & vascular intact distally    Radiology:   MRI 01/12/2012  Exam: MRI RT SHOULDER NON CONTRAST   Reason for Exam: RIGHT ARM PAIN X 1 MONTH OF UNCLEAR ETIOLOGY     Indication: Right arm pain for one month of unclear etiology     Technique: MRI of the right shoulder per standard department protocol   without the use of intravenous contrast.     Comparison: Radiographs of Dec 29, 2011     Findings:     There is supraspinatus tendinopathy with a high grade bursal surface   tear at attachment site on greater tuberosity. A thin full-thickness   perforating component is possible. There is no tendon  retraction. The   tear measures 1.2 cm in AP dimension. Subscapularis shows small   myotendinous cyst in keeping with low grade partial delaminating tear.   The teres minor and infraspinatus are intact. There is mild atrophy   involving cranial fibers of subscapularis.     The biceps tendon is intact the normally located in the bicipital   groove.     There is  superior labral degeneration, small nondisplaced degenerative   tear. Biceps anchor intact.     Acromioclavicular joint shows moderate hypertrophic degenerative   changes with mild mass effect on the musculotendinous junction of   supraspinatus. There is a small amount of fluid in   subacromial/subdeltoid bursa.     There is no fracture.     There are no focal cartilages defects.     There is no glenohumeral effusion.     There is subcoracoid bursitis.     Impression: Rotator cuff tears with hypertrophic degenerative changes   of acromioclavicular joint. Other findings as above.     MRI Shoulder Right WO Contrast  MR  Right Shoulder    CLINICAL INDICATION: Shoulder pain, rotator cuff disorder suspected, xray done    COMPARISON: Radiographs April 27, 2019; MRI Jan 12, 2012    TECHNIQUE: Ax GRE, Ax PDfs, Cor PD, Cor T41fs, Sag T83fs, Sag T1.    FINDINGS:    Rotator cuff: Full-thickness, complete tear supraspinatus with 2.5 cm maximal medial retraction. Remainder of cuff intact.    Bursa: Small amount of fluid subacromial/subdeltoid bursa. Mild subcoracoid bursitis.    Musculature: Mild chronic atrophy cranial fiber subscapularis. Otherwise the no significant muscle atrophy.    Labrum and biceps tendon: Biceps tendon intact and located in bicipital groove. There is superior labral degeneration. There is no evidence of labral tear.     Acromioclavicular joint:  Moderate degenerative changes acromioclavicular joint.  Glenohumeral joint:        Joint effusion: Small amount of fluid subscapularis recess.       Cartilage: No chondral  defects.    Bones: No fracture. Subcortical cystic change the posterior greater tuberosity.    IMPRESSION:     Full-thickness, retracted tear supraspinatus.          Reviewed and Electronically Signed by: Geanie Berlin   Signed Date/Time: 07-30-2019 14:57:37             Assessment and Plan:    This is an 58 year old right-hand-dominant female here today for evaluation of their acute on chronic atraumatic right shoulder pain. Patient has good ROM and strength when assessing the rotator cuff.  However, due to the severity of her rotator cuff tear, surgery is recommended at this time.  Patient agrees to surgery but would like for it to be at the end of February 2021 because she would like to get through the holidays before having surgery. At the end of the visit, [place] asked for her left knee to be evaluated but due to time constraints it was recommended that she schedule another appointment to discuss her concerns about her left knee.  A message was send to the front desk to schedule an appointment to discuss the left knee and to reach out regarding her future surgery. We reviewed the likely diagnosis, prognosis, and various treatment options in detail. Risks and benefits of treatment plan discussed. Patient's questions have been answered, patient understands condition and agrees with treatment plan. Patient knows to call for any new or worsening symptoms.      Patient was seen and examined with Dr. Jalene Mullet, who agrees with the assessment and plan. Please see his attending note.    Lysle Dingwall, MD  08/03/19

## 2019-08-03 NOTE — Patient Instructions (Addendum)
You will be contacted to schedule by Dr. Mayer Camel secretary Kern Reap.  You can call her at [phone]

## 2019-08-03 NOTE — Progress Notes (Signed)
Date of service:  08/03/2019    I have personally seen and examined the patient with the resident.    This is a 58 year old year old right-hand-dominant female who presents for chronic right shoulder pain.  She reports longstanding shoulder pain that has been particular worse over the past few months.  She reports pain over the anterolateral lateral shoulder and upper arm that is worse with reaching and lifting.  It does seem to bother her more at night and it is not a much during the day during activity.  She originally was seen for this 7 years ago.  An MRI at the time demonstrated a small rotator cuff tear and surgery was recommended but she decided to treated conservatively and did well for several years.  She was seen again over the summer and had a cortisone injection by her primary care provider which helped the pain for about 3 months.  The pain has come back.  She denies pain down the arm or paresthesias.    Physical examination:   General - the patient is alert and oriented x3, well appearing, in no acute distress.  Normal affect and mood.  Normal pupil dilation, extraocular motions intact  Nonlabored breathing  Neck: No midline tenderness.  Normal cervical motion without pain.  Spurling sign negative.  No lymphadenopathy.  Right shoulder demonstrates the skin is intact.  No atrophy.  Full active range of motion although she lacks a little bit internal rotation of the back.  There is some mild tenderness over the biceps tendon.  Strength is slightly 5 -/5 in elevation, otherwise 5/5.  Neer and Hawkins sign positive.  O'Brien's test equivocal.  Sensation intact C5-T1.  Radial pulse 2+    Radiographs: Right shoulder films from 04/27/2019 personally reviewed demonstrate mild degenerative changes at the glenohumeral joint and AC joint.  There is degeneration seen at the greater tuberosity.  No bony abnormalities.  Normal alignment    MRI right shoulder from 07/30/2019 images are personally reviewed.  There is a  large full-thickness tear of the supraspinatus and anterior infraspinatus retracted to the medial humeral head.  There is some tendinopathy of the subscapularis.  There is mild atrophy in the subscapularis and supraspinatus.  There may be some intertrabecular biceps tendinopathy and degeneration of the superior labrum without focal tearing.  Moderate AC joint arthropathy.    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  I reviewed the natural history of full-thickness rotator cuff tendon tears.  She did have a full-thickness tear 7 years ago and it has not progressed significantly.  Fortunately this is likely occurred very slowly and she has been able to compensate quite well with her motion and strength.  I did review conservative treatment options including physical therapy, anti-inflammatory medication cortisone injection.  However given the progression and large nature of the tear currently I think she is best treated with surgical intervention.  In looking at the MRI I do think this tear is repairable at some point and will progress to the point where he would be irreparable and require more complex reconstructive surgery to improve pain and function.  I did review the procedure for arthroscopic shoulder rotator cuff repair, debridement and possible biceps tenotomy or tenodesis including rehabilitation time.  She is interested in proceeding with surgery.  My office will contact her to schedule surgery and I will see her back for  preoperative visit.  At the end of encounter she did inquire about left knee pain and will make a separate appointment to have this evaluated.    I agree with the remainder of the plan per the resident.    This note was prepared using voice recognition software.  Please disregard any transcription errors.

## 2019-08-06 ENCOUNTER — Other Ambulatory Visit: Payer: Self-pay

## 2019-08-09 ENCOUNTER — Other Ambulatory Visit (HOSPITAL_BASED_OUTPATIENT_CLINIC_OR_DEPARTMENT_OTHER): Payer: Self-pay | Admitting: Orthopaedic Surgery

## 2019-08-09 DIAGNOSIS — R52 Pain, unspecified: Secondary | ICD-10-CM

## 2019-08-10 ENCOUNTER — Ambulatory Visit
Admission: RE | Admit: 2019-08-10 | Discharge: 2019-08-10 | Disposition: A | Discharge status: Home / Self Care | Payer: No Typology Code available for payment source | Attending: Orthopaedic Surgery | Admitting: Orthopaedic Surgery

## 2019-08-10 ENCOUNTER — Ambulatory Visit (HOSPITAL_BASED_OUTPATIENT_CLINIC_OR_DEPARTMENT_OTHER): Payer: No Typology Code available for payment source | Admitting: Orthopaedic Surgery

## 2019-08-10 ENCOUNTER — Ambulatory Visit (HOSPITAL_BASED_OUTPATIENT_CLINIC_OR_DEPARTMENT_OTHER)
Admit: 2019-08-10 | Discharge: 2019-08-10 | Disposition: A | Discharge status: Home / Self Care | Payer: No Typology Code available for payment source

## 2019-08-10 ENCOUNTER — Other Ambulatory Visit: Payer: Self-pay

## 2019-08-10 VITALS — Wt 152.0 lb

## 2019-08-10 DIAGNOSIS — M1712 Unilateral primary osteoarthritis, left knee: Secondary | ICD-10-CM | POA: Diagnosis present

## 2019-08-10 DIAGNOSIS — M170 Bilateral primary osteoarthritis of knee: Secondary | ICD-10-CM

## 2019-08-10 DIAGNOSIS — R52 Pain, unspecified: Secondary | ICD-10-CM

## 2019-08-10 MED ORDER — METHYLPREDNISOLONE ACETATE 40 MG/ML IJ SUSP
40.00 mg | Freq: Once | INTRAMUSCULAR | 0 refills | Status: AC
Start: 2019-08-10 — End: 2019-08-10

## 2019-08-10 NOTE — Progress Notes (Signed)
Date of service:  08/10/2019         Chief complaint: Left knee pain    HPI: This is a 58 year old year old female who presents for well over a year of atraumatic left knee pain.  She denies any trauma or inciting incident.  She has noticed pain primarily over the medial aspect of the knee.  It is worse with prolonged walking, stairs and other strenuous activities.  She thinks occasion gets a little swollen but otherwise denies locking, catching or instability.  She has tried oral anti-inflammatory medication including Mobic and a topical patch.  She did get a cortisone injection in her knee in March 2019 which did provide some relief.  She denies pain down the leg or paresthesias.  She denies prior trauma to the knee.    Regarding her right shoulder rotator cuff tear, she does wish to proceed with surgery in March.    Past Medical History: Hypertension.  Depression    Surgical History: Breast augmentation.  Cesarean section.  Reduction mammoplasty.  Hysterectomy.    Allergies:   Review of Patient's Allergies indicates:  No Known Allergies    Medications:     Current Outpatient Medications:     methylPREDNISolone acetate (DEPO-MEDROL) 40 MG/ML injection, Inject 1 mL into the articular space once  for 1 dose, Disp: 1 mL, Rfl: 0    polyethylene glycol (GLYCOLAX/MIRALAX) 17 g packet, Take 1 packet by mouth daily, Disp: 30 Package, Rfl: 0    DULoxetine (CYMBALTA) 30 MG capsule, Take 1 capsule by mouth daily, Disp: 30 capsule, Rfl: 1    naproxen (NAPROSYN) 500 MG tablet, Take 1 tablet by mouth every 12 (twelve) hours as needed for Pain as needed for pain. Take with food  for up to 10 days, Disp: 20 tablet, Rfl: 0    hydrochlorothiazide (HYDRODIURIL) 25 MG tablet, Take 1 tablet by mouth daily, Disp: 30 tablet, Rfl: 11    traZODone (DESYREL) 50 MG tablet, Take 1 tablet by mouth nightly, Disp: 30 tablet, Rfl: 11    meloxicam (MOBIC) 7.5 MG tablet, Take 1 tablet by mouth daily, Disp: , Rfl:     Social History: She  lives alone.  She has 3 children and is divorced.  She works in Education administrator.  She denies tobacco recreational drug use.  Uses a few drinks of alcohol per week.    Family History: Pertinent for gastric cancer in her father.  Heart disease in her brother and mother.  Diabetes in her mother..  Negative for arthritis, bleeding problems, blood clots,  , chronic  / lung / kidney / liver / thyroid disease,  , hypertension or stroke.    Review of Systems: 11 systems reviewed from 08/03/2019 encounter and are unchanged    Physical Exam:  Vital Signs: Wt 68.9 kg (152 lb)    LMP 07/11/2005    BMI 30.44 kg/m   Gen: Alert and oriented x3, well appearing in no acute distress. Normal affect and mood.  Normal pupil dilation, extraocular motions intact  Nonlabored breathing  MSK: Left lower extremity demonstrates mild varus alignment.  She has a mild antalgic gait.  No effusion.  Skin intact.  Motion 0-135 degrees.  Stable to varus valgus stressing at 0 and 30 degrees.  Negative Lachman, anterior and posterior drawer.  There is mild tenderness over the medial hamstring tendons but not at the pes bursa.  There is more localized tenderness over the medial compartment and joint line.  Murray's test  is equivocal.  No pain with patella grind or inhibition.  Strength 5.  Sensation intact.  Pedal pulses 2+    Radiology: Bilateral knee standing AP and 3 views left knee films are personally reviewed.    There is severe degenerative changes in the medial compartment the knee with near bone-on-bone contact, subchondral sclerosis and small spurring.  There is mild degenerative changes in the patellofemoral compartment.  There is also mild to moderate medial compartment narrowing on the right knee.    Assessment: Left knee arthritis    Plan: I reviewed the findings with the patient via telephone interpreter.  I discussed with her the natural history of degenerative arthritis.  I mash impressed she is not more symptomatic given the degree of  arthritis in the left knee.  I did recommend physical therapy and provided her referral.  She did wish to proceed with a repeat cortisone injection. A time out was held confirming the site of injection and laterality. After verbal consent and sterile prep, the left knee was injected with a combination of 40 mg of methylprednisolone and 9 cc of 0.5% Marcaine via a anteromedial approach.  A bandage was applied.  There were no complications.  I advised the patient to ice the area down later today.  She can have injections repeated at a minimum of every 4 months.  At some points her symptoms may progress and failed respond to conservative modalities in that situation she may consider total knee arthroplasty.  My office will reach out to her to schedule her rotator cuff surgery for March.  I will see her back for preoperative visit.    All questions were answered.    This note was prepared using voice recognition software.  Please disregard any transcription errors.      PATIENT/PROCEDURE VERIFICATION DOCUMENTATION    Correct patient: Yes  Correct procedure: Yes  Correct site, mark visible if applicable: Yes    Pre-procedure Vital Signs:  BP: N/A  P: N/A  R: N/A    Risks and Benefits reviewed: Yes  Side: Left  Correct position: Yes  Special equipment/implant(s) present, if applicable: Yes    Post-procedure Vitals Signs:  BP: N/A  P: N/A  R: N/A    Time-out completed, documented by provider doing procedure or designated team member:  Dannielle Burn, MD    08/10/2019    8:07 AM

## 2019-08-10 NOTE — Patient Instructions (Signed)
Put an ice pack on your knee at the injection site for 10-15 minutes later today.

## 2019-08-13 ENCOUNTER — Ambulatory Visit: Payer: No Typology Code available for payment source | Attending: Family Medicine

## 2019-08-13 ENCOUNTER — Other Ambulatory Visit: Payer: Self-pay

## 2019-08-13 ENCOUNTER — Ambulatory Visit (HOSPITAL_BASED_OUTPATIENT_CLINIC_OR_DEPARTMENT_OTHER): Payer: Self-pay | Admitting: Family Medicine

## 2019-08-13 DIAGNOSIS — Z23 Encounter for immunization: Secondary | ICD-10-CM | POA: Insufficient documentation

## 2019-08-13 DIAGNOSIS — R5383 Other fatigue: Secondary | ICD-10-CM | POA: Diagnosis present

## 2019-08-13 DIAGNOSIS — R5381 Other malaise: Secondary | ICD-10-CM | POA: Diagnosis present

## 2019-08-13 LAB — CBC WITH PLATELET
ABSOLUTE NRBC COUNT: 0 10*3/uL (ref 0.0–0.0)
HEMATOCRIT: 38.7 % (ref 34.1–44.9)
HEMOGLOBIN: 12.7 g/dL (ref 11.2–15.7)
MEAN CORP HGB CONC: 32.8 g/dL (ref 31.0–37.0)
MEAN CORPUSCULAR HGB: 31.4 pg (ref 26.0–34.0)
MEAN CORPUSCULAR VOL: 95.6 fl (ref 80.0–100.0)
MEAN PLATELET VOLUME: 11.5 fL (ref 8.7–12.5)
NRBC %: 0 % (ref 0.0–0.0)
PLATELET COUNT: 288 10*3/uL (ref 150–400)
RBC DISTRIBUTION WIDTH STD DEV: 45.6 fL (ref 35.1–46.3)
RED BLOOD CELL COUNT: 4.05 M/uL (ref 3.90–5.20)
WHITE BLOOD CELL COUNT: 8.3 10*3/uL (ref 4.0–11.0)

## 2019-08-13 LAB — FERRITIN: FERRITIN: 126 ng/mL (ref 8–252)

## 2019-08-13 NOTE — Progress Notes (Signed)
08/13/2019  VIS given prior to administration and reviewed with the patient and or legal guardian. Patient understands the disease and the vaccine. See immunization/Injection module or chart review for date of publication and additional information.  Maryelizabeth Kaufmann, LPN

## 2019-08-16 MED FILL — TRAZODONE 50MG: 30 days supply | Qty: 30 | Fill #8

## 2019-08-16 MED FILL — HYDROCHLOROT 25MG: 30 days supply | Qty: 30 | Fill #8

## 2019-08-16 MED FILL — OMEPRAZOLE 20MG: 7 days supply | Qty: 14 | Fill #0

## 2019-09-11 ENCOUNTER — Telehealth (HOSPITAL_BASED_OUTPATIENT_CLINIC_OR_DEPARTMENT_OTHER): Payer: Self-pay | Admitting: Registered Nurse

## 2019-09-11 NOTE — Telephone Encounter (Signed)
-----   Message from [facility] sent at 09/11/2019  9:41 AM EST -----  Contact: [phone]  Torilyn Brad [id number], 59 year old, female    Calls today:  Test Results  Test Results Request from Patient    What test result(s) is the patient requesting? labs  Date testing was done 08/12/2020  Who ordered the test(s) Lemar Livings, MD    Person calling on behalf of patient: Patient (self)    [place] NUMBER: [phone]    Patient's language of care: [place]    Patient needs a [place] interpreter.    Patient's PCP: Lemar Livings, MD

## 2019-09-11 NOTE — Telephone Encounter (Signed)
Component      Latest Ref Rng & Units 08/13/2019   WHITE BLOOD CELL COUNT      4.0 - 11.0 TH/uL 8.3   RED BLOOD CELL COUNT      3.90 - 5.20 M/uL 4.05   HEMOGLOBIN      11.2 - 15.7 g/dL 12.7   HEMATOCRIT      34.1 - 44.9 % 38.7   MEAN CORPUSCULAR VOL      80.0 - 100.0 fl 95.6   MEAN CORPUSCULAR HGB      26.0 - 34.0 pg 31.4   MEAN CORP HGB CONC      31.0 - 37.0 g/dL 32.8   RBC DISTRIBUTION WIDTH STD DEV      35.1 - 46.3 fL 45.6   PLATELET COUNT      150 - 400 TH/uL 288   MEAN PLATELET VOLUME      8.7 - 12.5 fL 11.5   NRBC %      0.0 - 0.0 % 0.0   ABSOLUTE NRBC COUNT      0.0 - 0.0 TH/uL 0.0   FERRITIN      8 - 252 ng/mL 126       Spoke with pt and reviewed the above results.  Pt voiced understanding and agrees to f/u with PCP prn.

## 2019-09-12 ENCOUNTER — Other Ambulatory Visit (HOSPITAL_BASED_OUTPATIENT_CLINIC_OR_DEPARTMENT_OTHER): Payer: Self-pay | Admitting: Family Medicine

## 2019-09-12 DIAGNOSIS — K2100 Gastro-esophageal reflux disease with esophagitis, without bleeding: Secondary | ICD-10-CM

## 2019-09-12 MED FILL — HYDROCHLOROT 25MG: 30 days supply | Qty: 30 | Fill #9

## 2019-09-13 ENCOUNTER — Ambulatory Visit: Payer: No Typology Code available for payment source | Attending: Family Medicine | Admitting: Family Medicine

## 2019-09-13 DIAGNOSIS — I10 Essential (primary) hypertension: Secondary | ICD-10-CM | POA: Insufficient documentation

## 2019-09-13 DIAGNOSIS — R05 Cough: Secondary | ICD-10-CM | POA: Insufficient documentation

## 2019-09-13 DIAGNOSIS — R059 Cough, unspecified: Secondary | ICD-10-CM

## 2019-09-13 DIAGNOSIS — R0789 Other chest pain: Secondary | ICD-10-CM | POA: Diagnosis present

## 2019-09-13 MED ORDER — DICLOFENAC SODIUM 1 % EX GEL
2.0000 g | Freq: Two times a day (BID) | CUTANEOUS | 0 refills | Status: DC
Start: 2019-09-13 — End: 2019-09-14

## 2019-09-13 MED FILL — DICLOFENAC GEL 1%: 14 days supply | Qty: 100 | Fill #0 | Status: CP

## 2019-09-13 MED FILL — OMEPRAZOLE 20MG: 7 days supply | Qty: 14 | Fill #0 | Status: CP

## 2019-09-13 NOTE — Telephone Encounter (Signed)
[facility], Stephanie [name] is a 59 year old female has requested a refill of omeprazole 20.      Last Office Visit: 07-25-19 with pcp  Last Physical Exam: 02-21-13    There are no preventive care reminders to display for this patient.    Other Med Adult:  Most Recent BP Reading(s)  08/03/19 : 122/84        Cholesterol (mg/dL)   Date Value   02/06/2016 210     LOW DENSITY LIPOPROTEIN DIRECT (mg/dL)   Date Value   02/06/2016 112     HIGH DENSITY LIPOPROTEIN (mg/dL)   Date Value   02/06/2016 71     TRIGLYCERIDES (mg/dl)   Date Value   01/20/2009 88         THYROID SCREEN TSH REFLEX FT4 (uIU/mL)   Date Value   02/06/2016 0.785         TSH (THYROID STIM HORMONE) (uIU/mL)   Date Value   12/10/2016 0.790       HEMOGLOBIN A1C (%)   Date Value   03/06/2015 5.6       No results found for: POCA1C      INR (no units)   Date Value   04/22/2008 1.0 (L)   02/07/2005 1.0 (L)       SODIUM (mmol/L)   Date Value   09/21/2017 135 (L)       POTASSIUM (mmol/L)   Date Value   09/21/2017 3.9           CREATININE (mg/dL)   Date Value   09/21/2017 0.7       Documented patient preferred pharmacies:    [facility] [place], [place] - [address]  Phone: [phone] Fax: [phone]
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
## 2019-09-14 ENCOUNTER — Ambulatory Visit: Payer: No Typology Code available for payment source | Attending: Family Medicine | Admitting: Physician Assistant

## 2019-09-14 ENCOUNTER — Encounter (HOSPITAL_BASED_OUTPATIENT_CLINIC_OR_DEPARTMENT_OTHER): Payer: Self-pay | Admitting: Physician Assistant

## 2019-09-14 ENCOUNTER — Other Ambulatory Visit: Payer: Self-pay

## 2019-09-14 VITALS — HR 95 | Temp 96.0°F

## 2019-09-14 DIAGNOSIS — R0789 Other chest pain: Secondary | ICD-10-CM | POA: Insufficient documentation

## 2019-09-14 MED ORDER — NAPROXEN SODIUM 550 MG PO TABS
550.00 mg | ORAL_TABLET | Freq: Two times a day (BID) | ORAL | 0 refills | Status: AC
Start: 2019-09-14 — End: 2019-10-14
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
## 2019-09-21 ENCOUNTER — Ambulatory Visit: Payer: No Typology Code available for payment source | Attending: Ophthalmology | Admitting: Ophthalmology

## 2019-09-21 ENCOUNTER — Other Ambulatory Visit: Payer: Self-pay

## 2019-09-21 DIAGNOSIS — H0102B Squamous blepharitis left eye, upper and lower eyelids: Secondary | ICD-10-CM | POA: Diagnosis present

## 2019-09-21 DIAGNOSIS — H0102A Squamous blepharitis right eye, upper and lower eyelids: Secondary | ICD-10-CM | POA: Diagnosis present

## 2019-09-21 DIAGNOSIS — H52203 Unspecified astigmatism, bilateral: Secondary | ICD-10-CM

## 2019-09-21 DIAGNOSIS — H269 Unspecified cataract: Secondary | ICD-10-CM | POA: Diagnosis present

## 2019-09-21 DIAGNOSIS — H524 Presbyopia: Secondary | ICD-10-CM | POA: Diagnosis not present

## 2019-09-21 DIAGNOSIS — H5203 Hypermetropia, bilateral: Secondary | ICD-10-CM

## 2019-09-21 NOTE — Progress Notes (Signed)
Pt here for annual exam. LEE 2017 with Dr. Erline Levine II.     Vision reported as blur at distance with her glasses. Glasses are from last exam.     Otherwise no new problems.     Ocular Hx:   Hyperopia with astigmatism OU, Presbyopia   Blepharitis OU   Early cataract OU - not visually significant

## 2019-09-21 NOTE — Progress Notes (Signed)
Patient presents with:  Blurry VA: Blurry vision, both eyes, at distance, for comprehensisve eye exam.  Cataracts, immature, very early nuclear sclerosis OU. No specific therapy.    Hyperopia with astigmatism and presbyopia.  She's given a prescription for glasses.    She asks about LASIK, laser vision correction, to be less dependent upon glasses.  We discussed options. Even with correction of hyperopia with astigmatism, she will need glasses at near.  She elects to have lasik consult. Will need re=refraction and cycloplegic refraction, corneal pachymetry and corneal topography preop.      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  I reminded her of the importance of distancing, to reduce the risk of serious infection.

## 2019-10-05 ENCOUNTER — Other Ambulatory Visit (HOSPITAL_BASED_OUTPATIENT_CLINIC_OR_DEPARTMENT_OTHER): Payer: Self-pay

## 2019-10-17 ENCOUNTER — Encounter: Payer: Self-pay | Admitting: Pediatrics

## 2019-10-17 ENCOUNTER — Other Ambulatory Visit: Payer: Self-pay | Admitting: Pediatrics

## 2019-10-17 ENCOUNTER — Ambulatory Visit (HOSPITAL_BASED_OUTPATIENT_CLINIC_OR_DEPARTMENT_OTHER): Payer: Self-pay | Admitting: Clinic/Center

## 2019-10-17 NOTE — Telephone Encounter (Signed)
Call via [place] telephone interpreter
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
## 2019-10-22 ENCOUNTER — Ambulatory Visit (HOSPITAL_BASED_OUTPATIENT_CLINIC_OR_DEPARTMENT_OTHER): Payer: Self-pay | Admitting: Ophthalmology

## 2019-10-27 ENCOUNTER — Other Ambulatory Visit (HOSPITAL_BASED_OUTPATIENT_CLINIC_OR_DEPARTMENT_OTHER): Payer: Self-pay | Admitting: Family Medicine

## 2019-10-27 DIAGNOSIS — I10 Essential (primary) hypertension: Secondary | ICD-10-CM

## 2019-10-28 NOTE — Telephone Encounter (Signed)
PER Pharmacy, Stephanie [name] is a 59 year old female has requested a refill of hctz.      Last Office Visit: 09/14/19 with [name]  Last Physical Exam: 02/21/2013      HTN Med:    Most Recent BP Reading(s)  08/03/19 : 122/84  06/03/19 : (!) 167/112  04/27/19 : (!) 132/91      Documented patient preferred pharmacies:    [facility], [place]. [place]  Phone: [phone] Fax: [phone]

## 2019-10-29 ENCOUNTER — Encounter (HOSPITAL_BASED_OUTPATIENT_CLINIC_OR_DEPARTMENT_OTHER): Payer: Self-pay | Admitting: Family Medicine

## 2019-10-29 ENCOUNTER — Ambulatory Visit: Payer: No Typology Code available for payment source | Attending: Family Medicine | Admitting: Family Medicine

## 2019-10-29 ENCOUNTER — Other Ambulatory Visit: Payer: Self-pay

## 2019-10-29 VITALS — BP 137/82 | HR 80 | Temp 97.2°F | Ht 59.25 in | Wt 164.0 lb

## 2019-10-29 DIAGNOSIS — M79602 Pain in left arm: Secondary | ICD-10-CM | POA: Insufficient documentation

## 2019-10-29 DIAGNOSIS — Z6832 Body mass index (BMI) 32.0-32.9, adult: Secondary | ICD-10-CM | POA: Insufficient documentation

## 2019-10-29 DIAGNOSIS — M1712 Unilateral primary osteoarthritis, left knee: Secondary | ICD-10-CM | POA: Insufficient documentation

## 2019-10-29 DIAGNOSIS — I10 Essential (primary) hypertension: Secondary | ICD-10-CM | POA: Diagnosis present

## 2019-10-29 DIAGNOSIS — E6609 Other obesity due to excess calories: Secondary | ICD-10-CM | POA: Diagnosis present

## 2019-10-29 DIAGNOSIS — F339 Major depressive disorder, recurrent, unspecified: Secondary | ICD-10-CM | POA: Diagnosis present

## 2019-10-29 DIAGNOSIS — Z23 Encounter for immunization: Secondary | ICD-10-CM | POA: Diagnosis present

## 2019-10-29 DIAGNOSIS — M79601 Pain in right arm: Secondary | ICD-10-CM | POA: Insufficient documentation

## 2019-10-29 DIAGNOSIS — F5104 Psychophysiologic insomnia: Secondary | ICD-10-CM | POA: Diagnosis present

## 2019-10-29 MED ORDER — OMEPRAZOLE 40 MG PO CPDR
40.0000 mg | DELAYED_RELEASE_CAPSULE | Freq: Every day | ORAL | 11 refills | Status: DC
Start: 2019-10-29 — End: 2020-10-31

## 2019-10-29 MED ORDER — TRAZODONE HCL 50 MG PO TABS
50.0000 mg | ORAL_TABLET | Freq: Every evening | ORAL | 11 refills | Status: DC
Start: 2019-10-29 — End: 2020-11-19

## 2019-10-29 MED ORDER — DULOXETINE HCL 30 MG PO CPEP
30.0000 mg | ORAL_CAPSULE | Freq: Every day | ORAL | 11 refills | Status: DC
Start: 2019-10-29 — End: 2020-06-19

## 2019-10-29 MED FILL — TRAZODONE  50MG: 30 days supply | Qty: 30 | Fill #0

## 2019-10-29 MED FILL — DULOXETINE 30MG: 30 days supply | Qty: 30 | Fill #0

## 2019-10-29 MED FILL — OMEPRAZOLE 40MG: 30 days supply | Qty: 30 | Fill #0

## 2019-10-29 MED FILL — HYDROCHLOROT 25MG: 30 days supply | Qty: 30 | Fill #0

## 2019-10-29 NOTE — Progress Notes (Signed)
Risks/benefits of procedure reviewed with pt including but not limited to bleeding, infection, potential to be ineffective. Pt also advised that after lidocaine portion of injection wears off there may be worse pain for one to two days as the steroid takes action. Pt gave verbal consent.   Area identified on left lateral aspect of the patella and cleaned with alcohol. Topical anesthesia achieved with ethyl chloride spray and are injected with 4cc's of lidocaine without epi and 40mg  of kenalog. Pt tolerated procedure well.  Discussed when to return to office vs seek emergent care.  Briscoe Deutscher, PA-C, 10/29/2019

## 2019-10-29 NOTE — Progress Notes (Signed)
Subjective     Stephanie [name] is a 59 year old female presents with desire to lose wt but chronic knee pain does not let her do exercise. She got great relief last time had knee injection. Trying to do exercises that are not weight bearing as well, wants PT.    ROS: no n/v/d/f; no dizziness; no SOB or CP, no leg swelling or cramps.    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     Alcohol abuse     Depression, recurrent (HCC)     Trauma and stressor-related disorder     Nontraumatic complete tear of right rotator cuff     Localized osteoarthritis of left knee    Review of patient's family history indicates:  Problem: Cancer - Other      Relation: Father          Age of Onset: (Not Specified)          Comment: stomach  Problem: Heart      Relation: Brother          Age of Onset: (Not Specified)          Comment: died at 67 during valve replacement  Problem: Heart      Relation: Brother          Age of Onset: (Not Specified)          Comment: chestpain with neg w/u  Problem: Heart      Relation: Mother          Age of Onset: (Not Specified)          Comment: Died of MI age 32  Problem: Diabetes      Relation: Mother          Age of Onset: (Not Specified)        hydrochlorothiazide (HYDRODIURIL) 25 MG tablet, TAKE 1 TABLET BY MOUTH DAILY, Disp: 30 tablet, Rfl: 11      omeprazole (PRILOSEC) 40 MG capsule, Take 1 capsule by mouth daily, Disp: 30 capsule, Rfl: 11      DULoxetine (CYMBALTA) 30 MG capsule, Take 1 capsule by mouth daily, Disp: 30 capsule, Rfl: 11      traZODone (DESYREL) 50 MG  tablet, Take 1 tablet by mouth nightly, Disp: 30 tablet, Rfl: 11      polyethylene glycol (GLYCOLAX/MIRALAX) 17 g packet, Take 1 packet by mouth daily, Disp: 30 Package, Rfl: 0    No current facility-administered medications for this visit.     Review of Patient's Allergies indicates:  No Known Allergies           Objective     BP 137/82    Pulse 80    Temp 97.2 F (36.2 C) (Temporal)    Ht 4' 11.25" (1.505 m)    Wt 74.4 kg (164 lb)    LMP 07/11/2005    SpO2 98%    BMI 32.85 kg/m   O: NAD  HEENT:  atraumatic, no icterus, OP clear  Skin: no lesions or rashes  CTA bilat  nl S1/S2, no murmur  Ext: no edema or clubbing         Assessment   1. Need for zoster vaccination     - IMMUNIZATION ADMIN SINGLE  - HZV ZOSTER VACC RECOMBINANT ADJUVANTED IM NJX    2. Need for prophylactic vaccination against Streptococcus pneumoniae (pneumococcus)     - PPSV23 VACCINE 2 YRS OR OLDER FOR SUBQ/IM USE    3. Localized osteoarthritis of left knee  Injection done today    4. Hypertension, goal below 140/90  At goal on meds    5. Pain in both upper extremities  Refilled, pt in control  - DULoxetine (CYMBALTA) 30 MG capsule; Take 1 capsule by mouth daily  Dispense: 30 capsule; Refill: 11    6. Depression, recurrent ([facility])  As above  - DULoxetine (CYMBALTA) 30 MG capsule; Take 1 capsule by mouth daily  Dispense: 30 capsule; Refill: 11    7. Psychophysiological insomnia  Refilled, doing well on this  - traZODone (DESYREL) 50 MG tablet; Take 1 tablet by mouth nightly  Dispense: 30 tablet; Refill: 11    8. Class 1 obesity due to excess calories without serious comorbidity with body mass index (BMI) of 32.0 to 32.9 in adult  Pt will start exercise again after injection and PT            Plan   F/u if plan above not successful  Orders Placed This Encounter      IMMUNIZATION ADMIN SINGLE [90471.1]      HZV ZOSTER VACC RECOMBINANT ADJUVANTED IM NJX [90750]      PPSV-23 (Pneumovax), IM [90732]      omeprazole (PRILOSEC) 40 MG capsule          Sig:  Take 1 capsule by mouth daily          Dispense:  30 capsule          Refill:  11      DULoxetine (CYMBALTA) 30 MG capsule          Sig: Take 1 capsule by mouth daily          Dispense:  30 capsule          Refill:  11      traZODone (DESYREL) 50 MG tablet          Sig: Take 1 tablet by mouth nightly          Dispense:  30 tablet          Refill:  11        We discussed the patients current medications. The patient expressed understanding and no barriers to adherence were identified.         Wilburn Mylar, MD

## 2019-10-29 NOTE — Progress Notes (Signed)
10/29/2019  VIS given prior to administration and reviewed with the patient and or legal guardian. Patient understands the disease and the vaccine. See immunization/Injection module or chart review for date of publication and additional information.  Dallie Dad, LPN

## 2019-11-06 ENCOUNTER — Ambulatory Visit: Payer: No Typology Code available for payment source | Admitting: Gastroenterology

## 2019-11-14 ENCOUNTER — Other Ambulatory Visit: Payer: Self-pay

## 2019-11-19 ENCOUNTER — Ambulatory Visit: Payer: 344 | Attending: Internal Medicine

## 2019-11-19 ENCOUNTER — Other Ambulatory Visit: Payer: Self-pay

## 2019-11-19 DIAGNOSIS — Z23 Encounter for immunization: Secondary | ICD-10-CM | POA: Diagnosis present

## 2019-11-20 ENCOUNTER — Other Ambulatory Visit: Payer: Self-pay

## 2019-11-20 ENCOUNTER — Ambulatory Visit: Payer: No Typology Code available for payment source | Attending: Orthopaedic Surgery

## 2019-11-20 DIAGNOSIS — M25562 Pain in left knee: Secondary | ICD-10-CM | POA: Diagnosis present

## 2019-11-20 DIAGNOSIS — M1712 Unilateral primary osteoarthritis, left knee: Secondary | ICD-10-CM | POA: Insufficient documentation

## 2019-11-20 DIAGNOSIS — G8929 Other chronic pain: Secondary | ICD-10-CM | POA: Diagnosis present

## 2019-11-20 NOTE — Progress Notes (Signed)
11/20/19 1300   Language Information   Language of Care Portuguese   Interpreter Yes   Precautions   Precautions No   Other   Other (Comments) HTN, depression   Rehab Discipline   Rehab Discipline PT   Visit   Visit number 1/IE   POC Due date 12/21/19   Time Calculation   Start Time 0930   Stop Time 1015   Time Calculation (min) 45 min   Pain   Pain Score 5    Ther Exercise   Exercise SLR   Ther Exercise 2   Exercise s/l hip abd   Ther Exercise 3   Exercise 3 prone hip extension    Patient Education   What was taught? POC, HEP   Method Verbal;Demo;Practice   Patient comprehension Yes     Brock Bad, [place], [place] # [id number]

## 2019-11-20 NOTE — Progress Notes (Signed)
OUTPATIENT PHYSICAL THERAPY EVALUATION    Referring Physician: Dannielle Burn, MD   Date of Onset: March 2018    Subjective: The patient is a 59 year old female who presents to physical therapy with L knee pain that began about 3 years ago with atraumatic onset. She has been diagnosed with OA and treated with multiple cortisone injections, most recently in December 2020 and on 10/29/19. She works as a Development worker, community, averaging 2 houses per day. She regularly goes to the gym to exercise, spending 30-45 minutes on the stair stepper about 3 days per week. She notes that going up stairs is much less painful than descending. Her greatest complaint is that she has a hard time with prolonged standing and walking which makes running errands, grocery shopping and working very painful.       XR L knee 08/10/19: joint space narrowing most pronounced in the left knee medial compartment.      Pain:     At worse: 9/10   At best: 5/10    Pre-morbid Functional Level:   Functional Deficits: prolonged walking    Aggravating factors: descending stairs is worse than ascending, prolonged walking   Relieving Factors: cortisone injection    Occupation: works as a Psychologist, forensic: [employer]  Driving: doesn't drive  Sleeping: reports that she sleeps well, with occasional sharp pain that wakes her until she can get comfortable again      Contraindications/Precautions:     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     Alcohol abuse     Depression, recurrent (HCC)     Trauma and stressor-related disorder      Nontraumatic complete tear of right rotator cuff     Localized osteoarthritis of left knee    hydrochlorothiazide (HYDRODIURIL) 25 MG tablet, TAKE 1 TABLET BY MOUTH DAILY, Disp: 30 tablet, Rfl: 11  omeprazole (PRILOSEC) 40 MG capsule, Take 1 capsule by mouth daily, Disp: 30 capsule, Rfl: 11  DULoxetine (CYMBALTA) 30 MG capsule, Take 1 capsule by mouth daily, Disp: 30 capsule, Rfl: 11  traZODone (DESYREL) 50 MG tablet, Take 1 tablet by mouth nightly, Disp: 30 tablet, Rfl: 11  polyethylene glycol (GLYCOLAX/MIRALAX) 17 g packet, Take 1 packet by mouth daily, Disp: 30 Package, Rfl: 0    No current facility-administered medications on file prior to visit.        Objective:  Please Note: Only populated fields were assessed by provider, fields left blank were not assessed.     LEFT A/PROM RIGHT A/PROM   HIP       FLEX WNL WNL     EXT WNL WNL     IR       ER     KNEE       FLEX symmetrical Symmetrical      EXT Lacking 2 deg 0   ANKLE       DF stiff stiff     PF  INV       EV        LEFT STRENGTH RIGHT STRENGTH   HIP       FLEX 4+ 4+     EXT 4+ 4+     ABD 4 4     ER 4 4     IR 4+ 4+   KNEE       FLEX 5 5     EXT 5 5   ANKLE       DF       PF       INV       EV       Muscle length:    Prone quad: minimally stiff R, minimally stiff L   90/90: minimally stiff R, minimally stiff L    Posture/gait: minimal antalgic gait, minimal varus deformity (L)      Special Tests: NT    Neurological Exam: The patient denies N/T throughout BLE    Tenderness to Palpation: max TTP at L medial joint line as well as superior and inferior to joint line     Increased Tissue Density: no significant findings     Joint Mobility:  PF joint hypomobile all planes bilaterally     Knee Girth:   Joint Line: 39 cm R, 42 cm L    Physical Therapy Plan of Care    MD: Lemar Livings, MD  Referring Provider: Dannielle Burn, MD  Diagnosis: Chronic L knee pain consistent with L knee OA     Assessment/Objective Findings:   Patient is a 59 year old female  with complaints of pain in her left knee that began about 3 years ago atraumatically. XR taken in December 2021 indicate significant joint space narrowing particularly at the medial L knee. She received cortisone injections in December 2020 as well as on 10/29/19 with good result following, however pain relief has not been long lasting. 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 Ain is especially worse with descending stairs, with prolonged walking and standing. Upon examination she presents with severe tenderness to palpation at the medial aspect of her L knee, hypomobility throughout B patellofemoral joints, impaired strength of B hip musculature, antalgic gait, L varus deformity and swelling at the L knee indicated by increased girth measure at the joint line.     Skilled physical therapy is indicated at this time in order to optimize quadriceps muscle control and stability and hip muscle strength in order to minimize stress on the L knee joint throughout performance of ADLs to minimize pain with prolonged standing, walking, and descending stairs.    Short Term Goals: 4 weeks  1. The patient will demonstrate improvement in B hip extension strength to 5/5  2. The patient will demonstrate improvement in B hip abduction strength to 4+/5 or better  3. The patient will report moderate to no tenderness at medial L knee joint line   Long Term Goals: 8 weeks  1. The patient will report ability to descend 1 FOS with reciprocal LE pattern without pain in L knee exceeding 2/10  2. The patient will report ability to walk 1 mile consecutively without pain in L knee exceeding 2/10  3. The patient will demonstrate independence and report compliance with HEP    Treatment Plan:  ** PT Eval - Low Complexity (CPT 97161)  ** Stretching/ROM/Therapeutic Exercise [phone])  ** Home Exercise Program/ Patient Education (CPT [phone])  **  Neuromuscular Re-education (CPT [id number])  ** Manual Therapy / Joint / Soft  tissue Mobilization (CPT 97140)  ** Gait Training (CPT [phone])  ** Functional Activities (CPT [phone])    Recommend skilled physical therapy services for 1 times per week for 6 weeks. Updated plan of care will be completed every 30 days.    The rehabilitation potential for this patient is excellent. Clinical presentation is stable.    Patient Regis Bill is aware of attendance policy: Yes  Plan of care discussed with Patient/Family: Yes  Patient goals reviewed and incorporated in plan of care: Yes  Patient/Family agrees with plan of care: Yes  Patient/Family education: Yes  Does patient feel safe at home: Yes      Brock Bad, Rafael Gonzalez, [place] # [id number]

## 2019-11-20 NOTE — Progress Notes (Signed)
I certify that the documented Treatment Plan is reasonable and necessary.    11/20/2019  Dannielle Burn, MD

## 2019-11-27 ENCOUNTER — Ambulatory Visit (HOSPITAL_BASED_OUTPATIENT_CLINIC_OR_DEPARTMENT_OTHER): Payer: No Typology Code available for payment source | Admitting: Rehabilitative and Restorative Service Providers"

## 2019-12-03 ENCOUNTER — Ambulatory Visit (HOSPITAL_BASED_OUTPATIENT_CLINIC_OR_DEPARTMENT_OTHER): Payer: No Typology Code available for payment source | Admitting: Rehabilitative and Restorative Service Providers"

## 2019-12-03 MED FILL — OMEPRAZOLE 40MG: 30 days supply | Qty: 30 | Fill #1

## 2019-12-03 MED FILL — TRAZODONE  50MG: 30 days supply | Qty: 30 | Fill #1

## 2019-12-03 MED FILL — DULOXETINE 30MG: 30 days supply | Qty: 30 | Fill #1

## 2019-12-03 MED FILL — HYDROCHLOROT 25MG: 30 days supply | Qty: 30 | Fill #1

## 2019-12-11 ENCOUNTER — Ambulatory Visit (HOSPITAL_BASED_OUTPATIENT_CLINIC_OR_DEPARTMENT_OTHER): Payer: No Typology Code available for payment source

## 2019-12-17 ENCOUNTER — Other Ambulatory Visit: Payer: Self-pay

## 2019-12-17 ENCOUNTER — Ambulatory Visit: Payer: 344 | Attending: Internal Medicine

## 2019-12-17 DIAGNOSIS — Z23 Encounter for immunization: Secondary | ICD-10-CM | POA: Diagnosis present

## 2019-12-18 ENCOUNTER — Ambulatory Visit (HOSPITAL_BASED_OUTPATIENT_CLINIC_OR_DEPARTMENT_OTHER): Payer: No Typology Code available for payment source

## 2020-01-14 MED FILL — OMEPRAZOLE 40MG: 30 days supply | Qty: 30 | Fill #2

## 2020-01-14 MED FILL — TRAZODONE  50MG: 30 days supply | Qty: 30 | Fill #2

## 2020-01-14 MED FILL — HYDROCHLOROT 25MG: 30 days supply | Qty: 30 | Fill #2

## 2020-01-14 MED FILL — DULOXETINE 30MG: 30 days supply | Qty: 30 | Fill #2

## 2020-02-04 ENCOUNTER — Other Ambulatory Visit: Payer: Self-pay

## 2020-02-07 ENCOUNTER — Ambulatory Visit: Payer: No Typology Code available for payment source | Attending: Family Medicine | Admitting: Family Medicine

## 2020-02-07 DIAGNOSIS — G44209 Tension-type headache, unspecified, not intractable: Secondary | ICD-10-CM | POA: Diagnosis present

## 2020-02-07 MED ORDER — IBUPROFEN 400 MG PO TABS
400.00 mg | ORAL_TABLET | Freq: Four times a day (QID) | ORAL | 0 refills | Status: AC | PRN
Start: 2020-02-07 — End: 2020-03-08

## 2020-02-07 MED FILL — IBUPROFEN 400MG: 15 days supply | Qty: 60 | Fill #0

## 2020-02-07 NOTE — Progress Notes (Signed)
A [place]) interpreter [phone]) was used for this visit    CC: headache    HPI:  -Sunday to Monday it started  -Tuesday felt better  -pain came back after  -never had HA before  -took tylenol x2 which helped a little  -no visual changes  -some dizziness, no specific trigger  -drinks 5-6 bottles of water, yesterday drank 8, today 4  -BPs have been normal  -some right sided chest pain under breast with coughing  -no CP otherwise  -was seen in ED for this in the past    PE:  General: Awake, alert, in no acute distress. Speaking in full sentences.  HEENT: Normocephalic, atraumatic.  Pulmonary: Effort normal.  Psych: Normal mood/affect.  Full PE deferred due to video visit.    A/P:  1. Tension headache  No red flag signs. Discussed adequate hydration, nutrition, and sleep. Will trial ibuprofen for symptomatic management - discussed proper medication use and side effects. Advised to call back if no improvement.  - ibuprofen (ADVIL) 400 MG tablet; Take 1 tablet by mouth every 6 (six) hours as needed for Pain  Dispense: 60 tablet; Refill: 0    Dellie Catholic, MD

## 2020-02-19 ENCOUNTER — Telehealth (HOSPITAL_BASED_OUTPATIENT_CLINIC_OR_DEPARTMENT_OTHER): Payer: Self-pay

## 2020-02-19 NOTE — Telephone Encounter (Signed)
LVM w/ [place] interpreter ID [phone] to reschedule televisit on 02/26/2020 w/ PCP due to provider schedule change.

## 2020-02-22 ENCOUNTER — Telehealth (HOSPITAL_BASED_OUTPATIENT_CLINIC_OR_DEPARTMENT_OTHER): Payer: Self-pay | Admitting: Registered Nurse

## 2020-02-22 ENCOUNTER — Telehealth (HOSPITAL_BASED_OUTPATIENT_CLINIC_OR_DEPARTMENT_OTHER): Payer: Self-pay | Admitting: Clinic/Center

## 2020-02-22 MED FILL — DULOXETINE 30MG: 30 days supply | Qty: 30 | Fill #3

## 2020-02-22 MED FILL — OMEPRAZOLE 40MG: 30 days supply | Qty: 30 | Fill #3

## 2020-02-22 MED FILL — HYDROCHLOROT 25MG: 30 days supply | Qty: 30 | Fill #3

## 2020-02-22 MED FILL — TRAZODONE  50MG: 30 days supply | Qty: 30 | Fill #3

## 2020-02-22 NOTE — Telephone Encounter (Signed)
-----   Message from [place] sent at 02/22/2020 12:59 PM EDT -----  Regarding: Medication Request  Contact: [phone]  Stephanie [name] [id number], 59 year old, female    Calls today:  Clinical Questions ([facility] [place])    Name of person calling Verdis Frederickson    Specific nature of request Pt is requesting medications, Amoxicillin 500 MG 28 Tablets  take 1 tablet every 6 hours, and Motrin 600 MG to be sent to the [facility].    Return phone number [phone]  Person calling on behalf of patient: Patient (self)      Patient's language of care: [place]    Patient needs a [place] interpreter.    Patient's PCP: Lemar Livings, MD

## 2020-02-22 NOTE — Telephone Encounter (Signed)
Sending to the correct pool

## 2020-02-22 NOTE — Telephone Encounter (Signed)
Call via [place]  telephone interpreter   unable to reach the patient    Telephone voice message left advising Urgent Care visit if she is not well           Calls today:  Clinical Questions (NON-SICK CLINICAL QUESTIONS ONLY)    Name of person calling Verdis Frederickson    Specific nature of request Pt is requesting medications, Amoxicillin 500 MG 28 Tablets  take 1 tablet every 6 hours, and Motrin 600 MG to be sent to the [facility].    Return phone number [phone]  Person calling on behalf of patient: Patient (self)      Patient's language of care: [place] ([place]    Patient needs a [place] interpreter.    Patient's PCP: Lemar Livings, MD

## 2020-02-26 ENCOUNTER — Ambulatory Visit (HOSPITAL_BASED_OUTPATIENT_CLINIC_OR_DEPARTMENT_OTHER): Payer: No Typology Code available for payment source | Admitting: Family Medicine

## 2020-03-26 ENCOUNTER — Other Ambulatory Visit: Payer: Self-pay

## 2020-03-26 ENCOUNTER — Ambulatory Visit (HOSPITAL_BASED_OUTPATIENT_CLINIC_OR_DEPARTMENT_OTHER): Payer: Self-pay
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
## 2020-03-26 NOTE — Telephone Encounter (Signed)
Regarding: Pain/Swollen Knee/Hand Wound?  ----- Message from Patterson Hammersmith sent at 03/26/2020  2:03 PM EDT -----  Stephanie [name] [id number], 59 year old, female    Calls today:  Sick    What are the symptoms pain all over body, left knee swollen, lump in eye grew a lot, hand injury? Itchy nail bed? "my cousin died from leukemia and had pain all over im worried about that" no appts this week to offer pt.  How long has patient been sick? 1 mo  What has pt. tried at home tre and lambana?  Person calling on behalf of patient: Patient (self)    CALL BACK NUMBER: [phone]    Best time to call back:   Cell phone:   Other phone:    Patient's language of care: [place] ([place]    Patient needs a [place] interpreter.    Patient's PCP: Lemar Livings, MD

## 2020-03-27 MED FILL — HYDROCHLOROT 25MG: 30 days supply | Qty: 30 | Fill #4

## 2020-03-27 MED FILL — TRAZODONE  50MG: 30 days supply | Qty: 30 | Fill #4

## 2020-03-27 MED FILL — DULOXETINE 30MG: 30 days supply | Qty: 30 | Fill #4

## 2020-03-27 MED FILL — OMEPRAZOLE 40MG: 30 days supply | Qty: 30 | Fill #4

## 2020-04-14 ENCOUNTER — Encounter

## 2020-04-17 ENCOUNTER — Ambulatory Visit: Payer: No Typology Code available for payment source | Attending: Family Medicine | Admitting: Family Medicine

## 2020-04-17 ENCOUNTER — Other Ambulatory Visit: Payer: Self-pay

## 2020-04-17 ENCOUNTER — Encounter (HOSPITAL_BASED_OUTPATIENT_CLINIC_OR_DEPARTMENT_OTHER): Payer: Self-pay | Admitting: Family Medicine

## 2020-04-17 VITALS — BP 142/102 | HR 81 | Temp 97.4°F | Wt 164.4 lb

## 2020-04-17 DIAGNOSIS — L03019 Cellulitis of unspecified finger: Secondary | ICD-10-CM | POA: Diagnosis not present

## 2020-04-17 DIAGNOSIS — I10 Essential (primary) hypertension: Secondary | ICD-10-CM

## 2020-04-17 DIAGNOSIS — K219 Gastro-esophageal reflux disease without esophagitis: Secondary | ICD-10-CM

## 2020-04-17 DIAGNOSIS — R609 Edema, unspecified: Secondary | ICD-10-CM

## 2020-04-17 DIAGNOSIS — L989 Disorder of the skin and subcutaneous tissue, unspecified: Secondary | ICD-10-CM

## 2020-04-17 LAB — CBC WITH PLATELET
ABSOLUTE NRBC COUNT: 0 10*3/uL (ref 0.0–0.0)
HEMATOCRIT: 44.2 % (ref 34.1–44.9)
HEMOGLOBIN: 14.5 g/dL (ref 11.2–15.7)
MEAN CORP HGB CONC: 32.8 g/dL (ref 31.0–37.0)
MEAN CORPUSCULAR HGB: 31.4 pg (ref 26.0–34.0)
MEAN CORPUSCULAR VOL: 95.7 fl (ref 80.0–100.0)
MEAN PLATELET VOLUME: 11.2 fL (ref 8.7–12.5)
NRBC %: 0 % (ref 0.0–0.0)
PLATELET COUNT: 255 10*3/uL (ref 150–400)
RBC DISTRIBUTION WIDTH STD DEV: 46.5 fL — ABNORMAL HIGH (ref 35.1–46.3)
RED BLOOD CELL COUNT: 4.62 M/uL (ref 3.90–5.20)
WHITE BLOOD CELL COUNT: 6 10*3/uL (ref 4.0–11.0)

## 2020-04-17 MED ORDER — TRIAMCINOLONE ACETONIDE 0.1 % EX OINT
TOPICAL_OINTMENT | Freq: Two times a day (BID) | CUTANEOUS | 1 refills | Status: AC
Start: 2020-04-17 — End: 2020-06-17

## 2020-04-17 MED FILL — TRIAMCINOLON OIN 0.1%: 14 days supply | Qty: 80 | Fill #0

## 2020-04-17 NOTE — Progress Notes (Signed)
CC: multiple concerns    #)eye lesion  -next to left eye  -itchy, growing  -would like it removed    #)GI  -when eating, has stomach upset  -feels gases  -feels very full after eating  -going on for 40-50 days  -also has acid reflux, taking omeprazole for a long time  -no abdominal pain after eating    #)swelling  -reports generalized swelling  -in legs, arms, hands  -began about two-three weeks ago    #)finger swelling  -some swelling of skin around fingernails    #)BP  -elevated today (but did not take home meds)    ROS:  -occasional diarrhea  -no nausea  -no vomiting  -no orthopnea  -reports some chronic dyspnea (no changes)  -no fever      Meds:  -takes everything that is on list  -also on glucosamine chondroitin, multivitamin, and magnesium    O:  BP (!) 142/102    Pulse 81    Temp 97.4 F (36.3 C) (Temporal)    Wt 74.6 kg (164 lb 6.4 oz)    LMP 07/11/2005    SpO2 97%    BMI 32.93 kg/m   Gen: NAD  HEENT: MMM, flesh colored papule near left eye  CV: S1, S2, RRR  Pulm: CTAB  Psych: normal speech and thought  Skin: no rash, some swelling of skin around fingernails noted      (R60.9) Swelling of body region  (primary encounter diagnosis)  Comment: checking TSH  Plan: THYROID SCREEN TSH REFLEX FT4, COMPREHENSIVE         METABOLIC PANEL, CBC WITH PLATELET, VITAMIN         B12, HEMOGLOBIN A1C            (K21.9) Gastroesophageal reflux disease, unspecified whether esophagitis present  Comment: checking for parasites, ordering EGD as well  Plan: SCHISTOSOMAL AB IGG, STRONGYLOIDES AB IGG, EGD            (I10) Hypertension, goal below 140/90  Comment: BP elevated, did not take medicine today though  Plan: will monitor    (L03.019) Onychia and paronychia of finger  Comment: seems like mild paronychia, will treat with topical TAC ointment.  Plan: Advised to limit steroid cream to no more than 2 weeks at a time without taking a one week break to prevent skin atrophy      (L98.9) Lesion of face  Comment:  referring to ENT for removal consultation given located on face near eye  Plan:

## 2020-04-18 LAB — HEMOGLOBIN A1C
ESTIMATED AVERAGE GLUCOSE: 131 mg/dL (ref 74–160)
HEMOGLOBIN A1C: 6.2 % — ABNORMAL HIGH (ref 4.0–5.6)

## 2020-04-21 ENCOUNTER — Encounter (HOSPITAL_BASED_OUTPATIENT_CLINIC_OR_DEPARTMENT_OTHER): Payer: Self-pay

## 2020-04-22 LAB — COMPREHENSIVE METABOLIC PANEL
ALANINE AMINOTRANSFERASE: 91 U/L — ABNORMAL HIGH (ref 12–45)
ALBUMIN: 3.7 g/dL (ref 3.4–5.0)
ALKALINE PHOSPHATASE: 97 U/L (ref 45–117)
ANION GAP: 4 mmol/L — ABNORMAL LOW (ref 5–15)
ASPARTATE AMINOTRANSFERASE: 69 U/L — ABNORMAL HIGH (ref 8–34)
BILIRUBIN TOTAL: 0.4 mg/dL (ref 0.2–1.0)
BUN (UREA NITROGEN): 15 mg/dL (ref 7–18)
CALCIUM: 9.4 mg/dL (ref 8.5–10.1)
CARBON DIOXIDE: 29 mmol/L (ref 21–32)
CHLORIDE: 104 mmol/L (ref 98–107)
CREATININE: 0.7 mg/dL (ref 0.4–1.2)
ESTIMATED GLOMERULAR FILT RATE: >60 mL/min (ref >60)
Glucose Random: 107 mg/dL (ref 74–160)
POTASSIUM: 3.9 mmol/L (ref 3.5–5.1)
SODIUM: 137 mmol/L (ref 136–145)
TOTAL PROTEIN: 7.6 g/dL (ref 6.4–8.2)

## 2020-04-22 LAB — THYROID SCREEN TSH REFLEX FT4: THYROID SCREEN TSH REFLEX FT4: 1.12 u[IU]/mL (ref 0.358–3.740)

## 2020-04-22 LAB — SCHISTOSOMAL AB IGG: SCHISTOSOMAL AB IgG: 13.6 INDEX — ABNORMAL HIGH (ref 0.00–8.99)

## 2020-04-22 LAB — VITAMIN B12: VITAMIN B12: 269 pg/mL (ref 193–986)

## 2020-04-22 LAB — STRONGYLOIDES AB IGG: STRONGYLOIDES AB IgG: 3.27 INDEX (ref 0.00–9.99)

## 2020-04-23 ENCOUNTER — Encounter (HOSPITAL_BASED_OUTPATIENT_CLINIC_OR_DEPARTMENT_OTHER): Payer: Self-pay | Admitting: Family Medicine

## 2020-04-23 ENCOUNTER — Encounter (HOSPITAL_BASED_OUTPATIENT_CLINIC_OR_DEPARTMENT_OTHER): Payer: Self-pay

## 2020-04-23 ENCOUNTER — Emergency Department
Admission: AD | Admit: 2020-04-23 | Discharge: 2020-04-23 | Disposition: A | Discharge status: Home / Self Care | Payer: No Typology Code available for payment source | Source: Intra-hospital | Attending: Emergency Medicine | Admitting: Emergency Medicine

## 2020-04-23 DIAGNOSIS — S0083XA Contusion of other part of head, initial encounter: Secondary | ICD-10-CM | POA: Diagnosis present

## 2020-04-23 DIAGNOSIS — W07XXXA Fall from chair, initial encounter: Secondary | ICD-10-CM | POA: Insufficient documentation

## 2020-04-23 NOTE — Narrator Note (Signed)
Patient Disposition  Patient education for diagnosis, medications, activity, diet and follow-up.  Patient left UC 8:19 PM.  Patient rep received written instructions.    Interpreter to provide instructions: Yes; Interpreter ID: [place]    Patient belongings with patient: YES    Have all existing LDAs been addressed? N/A    Have all IV infusions been stopped? N/A    Destination: Home

## 2020-04-23 NOTE — UC Provider Notes (Signed)
ED nursing record was reviewed. Prior records as available electronically through the Epic record were reviewed.  A [place] interpreter was used.      HPI:    This 59 year old female patient presents to the urgent care with chief complaint of bruising on face.  3 days ago the patient fell off a bench and landed on her right forehead.  She was not knocked unconscious.  It was a little bit sore and she had some swelling so she put ice on it.  Since then she has been feeling fine.  No headache, blurry vision, double vision, dizziness, nausea, vomiting, difficulty walking, or any other complaints.  People noticed that she was having bruising around her eyes today so she came to be checked out.      ROS: Pertinent positives were reviewed as per the HPI above. All other systems were reviewed and are negative.      Past Medical History/Problem list:  Past Medical History:  02/12/2016: Chronic right shoulder pain  No date: Depression  03/20/2012: Diverticulosis of colon      Comment:  Noted on colonoscopy 6/13   08/16/2010: Elevated hemoglobin A1c      Comment:  HEMOGLOBIN A1C (%) Date Value 03/06/2015 5.6                05/20/2014 5.4 05/08/2012 6.0 (H)   No results found for:               POCA1C   11/21/2012: Hyperopia with astigmatism and presbyopia  No date: Hypertension  11/21/2012: Immature cataract  01/21/2012: Impingement syndrome of right shoulder      Comment:  10/14: pt taking  [place] med (meloxicam 7.5 mg BID                and cyclobenzaprine); Will ask for refill once supply                finished; PCP ok with refill of flexeril on temporary                basis;   01/26/2008: Irritable bladder      Comment:  Seen by Dr Jeraldine Loots 5/09, no incontinence, likely                irritable bladder.  Trial of anticholinergic, refer to                urology, has appt Dr Minna Antis 02/23/08  02/16/2005: Leiomyoma of uterus, unspecified      Comment:  Dr Jeraldine Loots hysterectomy November, 2006; cervix retained,               last  pap 11/11 neg Repeat pap/HPV 07/2015   02/15/2014: Mass of hand  07/30/2011: Other plastic surgery for unacceptable cosmetic appearance      Comment:  Breast augmentation scheduled 08/02/11;  Dr.  Ancil Boozer Phone#: [phone]     01/08/2016: Schistosomiasis  08/13/2016: Squamous 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   eyes  06/18/2004: Urinary calculus, unspecified  Patient Active Problem List:     Class 1 obesity due to excess calories without serious comorbidity with body mass index (BMI) of 32.0 to 32.9 in adult     Pure hypercholesterolemia     Family history of malignant neoplasm of gastrointestinal tract     Tubular adenoma of colon     Hyperopia with astigmatism  and presbyopia     Immature cataract     Hypertension, goal below 140/90     Adenomatous polyp of colon     Squamous 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 eyes     Alcohol abuse     Depression, recurrent (HCC)     Trauma and stressor-related disorder     Nontraumatic complete tear of right rotator cuff     Localized osteoarthritis of left knee              Past Surgical History: Past Surgical History:  No date: BREAST ENHANCEMENT SURGERY      Comment:  12/12 had bilateral mammoplasty and saline implants, had               2nd surgery 5/30 for revision  No date: MAMMAPLASTY AUGMENTATION W/PROSTHETIC IMPLANT  No date: MASTOPEXY  No date: OB ANTEPARTUM CARE CESAREAN DLVR & POSTPARTUM      Comment:  x3  No date: REDUCTION MAMMAPLASTY  No date: TOTAL ABDOMINAL HYSTERECT W/WO RMVL TUBE OVARY      Comment:  for fibroids, still has cervix      Medications: No current facility-administered medications on file prior to encounter.  triamcinolone (KENALOG) 0.1 % ointment, Apply topically 2 (two) times daily, Disp: 80 g, Rfl: 1  hydrochlorothiazide (HYDRODIURIL) 25 MG tablet, TAKE 1 TABLET BY MOUTH DAILY, Disp: 30 tablet, Rfl: 11  omeprazole (PRILOSEC) 40 MG capsule, Take 1 capsule by mouth daily, Disp: 30 capsule, Rfl:  11  DULoxetine (CYMBALTA) 30 MG capsule, Take 1 capsule by mouth daily, Disp: 30 capsule, Rfl: 11  traZODone (DESYREL) 50 MG tablet, Take 1 tablet by mouth nightly, Disp: 30 tablet, Rfl: 11  polyethylene glycol (GLYCOLAX/MIRALAX) 17 g packet, Take 1 packet by mouth daily, Disp: 30 Package, Rfl: 0          Social History:   Social History     Socioeconomic History    Marital status: Divorced     Spouse name: Not on file    Number of children: Not on file    Years of education: Not on file    Highest education level: Not on file   Occupational History    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; 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   Tobacco Use    Smoking status: Former Smoker     Types: Cigarettes     Quit date: 07/27/2001     Years since quitting: 18.7    Smokeless tobacco: Never Used   Substance and Sexual Activity    Alcohol use: Yes     Alcohol/week: 6.0 standard drinks     Types: 6 Cans of beer per week     Comment: 6 beers per week     Drug use: No    Sexual activity: Not Currently     Partners: Male     Comment: G3P3, no hx STD, no hx abn Pap   Other Topics Concern    Not on file   Social History Narrative    08/03/19 Divorced, has 3 children, lives alone, works as a Education administrator lady    Social Determinants of Librarian, academic Strain:     Difficulty of Paying Living Expenses:   Food Insecurity:     Worried About Charity fundraiser in the Last Year:     Arboriculturist in the Last Year:   Transportation Needs:     Film/video editor (Medical):  Lack of Transportation (Non-Medical):   Physical Activity:     Days of Exercise per Week:     Minutes of Exercise per Session:   Stress:     Feeling of Stress :   Social Connections:     Frequency of Communication with Friends and Family:     Frequency of Social Gatherings with Friends and Family:     Attends Religious Services:     Active Member of Clubs or Organizations:     Attends Archivist Meetings:     Marital  Status:   Intimate Partner Violence:     Fear of Current or Ex-Partner:     Emotionally Abused:     Physically Abused:     Sexually Abused:         Allergies:  Review of Patient's Allergies indicates:  No Known Allergies      Physical Exam:  BP 108/69    Pulse 90    Temp 97 F    Resp 16    Wt 74.8 kg (165 lb)    LMP 07/11/2005    SpO2 96%    BMI 33.05 kg/m     GENERAL: No acute distress.   SKIN:  Warm & Dry, no rash.  HEAD: Hematoma on right forehead.  Bruising around right eye and a little bit medial to left eye. Sclerae are anicteric and aninjected, oropharynx is clear with moist mucous membranes.     NECK:  Supple.  No meningismus.  C-spine nontender.  Back: No thoracic or lumbar spine tenderness  LUNGS:  Clear to auscultation bilaterally.   HEART:  RRR.   ABDOMEN:  Soft, NTND.  No involuntary guarding or rebound.   EXTREMITIES:  No obvious deformities.   NEUROLOGIC:  Alert. Moves all extremities well. Speaking clearly.  Extraocular muscles intact.  Pupils equal round reactive to light.  PSYCHIATRIC:  Appropriate for age, time of day, and situation        ED Course and Medical Decision-making:  This 59 year old female patient presents with bruising on her face.  Most likely the bruising around her eyes has tracked down from a hematoma on her forehead.  I not think she has skull fracture or intracranial hemorrhage.  Patient is otherwise asymptomatic.      Condition: Stable  Disposition: Discharge      Diagnosis/Diagnoses:  Forehead hematoma    Azalia Bilis, MD

## 2020-04-23 NOTE — Narrator Note (Signed)
MD examined patient. Patient cleared for discharge and was given discharge instructions.

## 2020-04-23 NOTE — UC Triage Notes (Signed)
Presents with right sided forehead pain and bruising status post fall and hitting her head on the floor. Occurred Sunday. No loc, vomiting, or dizziness. Patient now concerned that bruising is showing up around right eye and nose. No reported visual problems.

## 2020-04-25 ENCOUNTER — Encounter (HOSPITAL_BASED_OUTPATIENT_CLINIC_OR_DEPARTMENT_OTHER): Payer: Self-pay

## 2020-04-25 ENCOUNTER — Encounter (HOSPITAL_BASED_OUTPATIENT_CLINIC_OR_DEPARTMENT_OTHER): Payer: Self-pay | Admitting: Family Medicine

## 2020-04-25 ENCOUNTER — Ambulatory Visit: Payer: No Typology Code available for payment source | Attending: Family Medicine | Admitting: Family Medicine

## 2020-04-25 ENCOUNTER — Emergency Department
Admission: EM | Admit: 2020-04-25 | Discharge: 2020-04-25 | Disposition: A | Discharge status: Home / Self Care | Payer: No Typology Code available for payment source | Source: Intra-hospital | Attending: Student in an Organized Health Care Education/Training Program | Admitting: Student in an Organized Health Care Education/Training Program

## 2020-04-25 ENCOUNTER — Emergency Department (HOSPITAL_BASED_OUTPATIENT_CLINIC_OR_DEPARTMENT_OTHER): Payer: No Typology Code available for payment source

## 2020-04-25 ENCOUNTER — Other Ambulatory Visit: Payer: Self-pay

## 2020-04-25 DIAGNOSIS — R519 Headache, unspecified: Secondary | ICD-10-CM

## 2020-04-25 DIAGNOSIS — I10 Essential (primary) hypertension: Secondary | ICD-10-CM

## 2020-04-25 DIAGNOSIS — S0990XA Unspecified injury of head, initial encounter: Secondary | ICD-10-CM | POA: Diagnosis present

## 2020-04-25 DIAGNOSIS — W08XXXA Fall from other furniture, initial encounter: Secondary | ICD-10-CM | POA: Diagnosis not present

## 2020-04-25 DIAGNOSIS — B659 Schistosomiasis, unspecified: Secondary | ICD-10-CM

## 2020-04-25 DIAGNOSIS — R7303 Prediabetes: Secondary | ICD-10-CM | POA: Insufficient documentation

## 2020-04-25 DIAGNOSIS — S0083XA Contusion of other part of head, initial encounter: Secondary | ICD-10-CM | POA: Insufficient documentation

## 2020-04-25 DIAGNOSIS — R079 Chest pain, unspecified: Secondary | ICD-10-CM

## 2020-04-25 MED ORDER — FAMOTIDINE 20 MG/2ML IV SOLN
20.0000 mg | Freq: Once | INTRAVENOUS | Status: DC
Start: 2020-04-25 — End: 2020-04-25

## 2020-04-25 MED ORDER — IBUPROFEN 600 MG PO TABS
600.00 mg | ORAL_TABLET | Freq: Three times a day (TID) | ORAL | 0 refills | Status: AC | PRN
Start: 2020-04-25 — End: 2020-05-05

## 2020-04-25 MED ORDER — ACETAMINOPHEN 325 MG PO TABS
650.00 mg | ORAL_TABLET | Freq: Once | ORAL | Status: AC
Start: 2020-04-25 — End: 2020-04-25
  Administered 2020-04-25: 650 mg via ORAL
  Filled 2020-04-25: qty 2

## 2020-04-25 MED ORDER — ALUMINUM & MAGNESIUM HYDROXIDE 200-200 MG/5ML PO SUSP
30.0000 mL | Freq: Once | ORAL | Status: DC
Start: 2020-04-25 — End: 2020-04-25

## 2020-04-25 MED ORDER — PRAZIQUANTEL 600 MG PO TABS
20.00 mg/kg | ORAL_TABLET | Freq: Three times a day (TID) | ORAL | 0 refills | Status: AC
Start: 2020-04-25 — End: 2020-04-26

## 2020-04-25 MED ORDER — LIDOCAINE VISCOUS HCL 2 % MT SOLN
5.0000 mL | Freq: Once | OROMUCOSAL | Status: DC
Start: 2020-04-25 — End: 2020-04-25

## 2020-04-25 MED FILL — BILTRICIDE  600MG: 1 days supply | Qty: 6 | Fill #0

## 2020-04-25 NOTE — Discharge Instructions (Addendum)
You were seen in the emergency department for evaluation of your headache and forehead pain.    Your CT scans were normal    Use ibuprofen and/or Tylenol for pain control    Come back to the emergency department if worse and otherwise follow-up with your primary care doctor

## 2020-04-25 NOTE — Progress Notes (Signed)
Subjective     Stephanie [name] is a 59 year old female presents for f/u on labs drawn at last visit and headache - had been seen in ER this AM since was unaware of appt today in person (no mychart) and had head CT that showed no abnormalities.  She can't exercise bec of knee swelling and pain.    ROS: no n/v/d/f; no dizziness; no SOB or CP, no leg swelling or cramps.      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     Alcohol abuse     Depression, recurrent (HCC)     Trauma and stressor-related disorder     Nontraumatic complete tear of right rotator cuff     Localized osteoarthritis of left knee    Review of patient's family history indicates:  Problem: Cancer - Other      Relation: Father          Age of Onset: (Not Specified)          Comment: stomach  Problem: Heart      Relation: Brother          Age of Onset: (Not Specified)          Comment: died at 27 during valve replacement  Problem: Heart      Relation: Brother          Age of Onset: (Not Specified)          Comment: chestpain with neg w/u  Problem: Heart      Relation: Mother          Age of Onset: (Not Specified)          Comment: Died of MI age 43  Problem: Diabetes      Relation: Mother          Age of Onset: (Not Specified)    ibuprofen (ADVIL) 600 MG tablet, Take 1 tablet by mouth every 8 (eight) hours as needed for Pain pain  for up to 10 days, Disp: 20 tablet, Rfl: 0  praziquantel (BILTRICIDE) 600 MG tablet, Take 2 tablets by mouth 3 (three) times daily  for 1 day, Disp: 6 tablet, Rfl: 0  triamcinolone (KENALOG) 0.1 %  ointment, Apply topically 2 (two) times daily, Disp: 80 g, Rfl: 1  hydrochlorothiazide (HYDRODIURIL) 25 MG tablet, TAKE 1 TABLET BY MOUTH DAILY, Disp: 30 tablet, Rfl: 11  omeprazole (PRILOSEC) 40 MG capsule, Take 1 capsule by mouth daily, Disp: 30 capsule, Rfl: 11  DULoxetine (CYMBALTA) 30 MG capsule, Take 1 capsule by mouth daily, Disp: 30 capsule, Rfl: 11  traZODone (DESYREL) 50 MG tablet, Take 1 tablet by mouth nightly, Disp: 30 tablet, Rfl: 11  polyethylene glycol (GLYCOLAX/MIRALAX) 17 g packet, Take 1 packet by mouth daily, Disp: 30 Package, Rfl: 0    No current facility-administered medications for this visit.    Review of Patient's Allergies indicates:  No Known Allergies  Objective     Telephonic, pt speaking in full sentences with ease           Assessment   Problem List Items Addressed This Visit        Cardiac and Vasculature    Hypertension, goal below 140/90      Other Visit Diagnoses     Schistosomiasis    -  Primary    Headache disorder        Pre-diabetes                    Plan   New diagnoses reviewed with her - pre-diabetes and schisto.  Meds for schisto sent to pharmacy and diet counseling for pre-diabetes and fatty liver.  Pt will make appt with PCP and change from Dr. Chauncey Cruel to "tell her everything"; knee injection only helped a little.  Orders Placed This Encounter      praziquantel (BILTRICIDE) 600 MG tablet          Sig: Take 2 tablets by mouth 3 (three) times daily  for 1 day          Dispense:  6 tablet          Refill:  0        We discussed praziquantel and the importance of medication compliance. The patient was ready to learn and no apparent learning barriers were identified. I explained the diagnosis and treatment plan, and the patient expressed understanding of the content. Possible side effects of the prescribed medication(s) were explained, including nausea.  I attempted to answer any questions regarding the diagnosis and the proposed treatment.    We discussed the patients  current medications. The patient expressed understanding and no barriers to adherence were identified.           Wilburn Mylar, MD

## 2020-04-25 NOTE — ED Triage Note (Signed)
Fell off bench earlier this week, was seen at [facility] urgent care after. States she is having increased headaches since, bruising to right eye, neuros WNL. No relief from tylenol last taken last night

## 2020-04-25 NOTE — Narrator Note (Signed)
Patient Disposition  Patient education for diagnosis, medications, activity, diet and follow-up.  Patient left ED 12:35 PM.  Patient rep received written instructions.    Interpreter to provide instructions: No    Patient belongings with patient: YES    Have all existing LDAs been addressed? N/A    Have all IV infusions been stopped? Yes    Destination: Discharged to home

## 2020-04-25 NOTE — ED Provider Notes (Signed)
eMERGENCY dEPARTMENT Physician Assistant NOTE    The ED nursing record was reviewed.   The prior medical records as available electronically through Epic were reviewed.  The mode of arrival was Self     This patient was seen with Emergency Department attending physician Dr. Sylvan Cheese    CHIEF COMPLAINT    Patient presents with:  Headache      HPI    Stephanie [name] is a 59 year old female presents to the emergency department for evaluation of head pain and right eye bruising which has worsened since urgent care visit 2 days ago.    She had a fall 3 days ago off of a bench, landing on her right forehead.    No loss of consciousness.    She had been seen in urgent care on 8/25 at which point was diagnosed with concussion.    She has not use any medications at home but notes that her bruising has changed and she continues to have pain in the area of her forehead.  No specific headache.  No lightheadedness, dizziness, near syncope.  No nausea or vomiting.  She is on no blood thinners.    Patient followed by Dr. Katy Apo      Past Medical History:  02/12/2016: Chronic right shoulder pain  No date: Depression  03/20/2012: Diverticulosis of colon      Comment:  Noted on colonoscopy 6/13   08/16/2010: Elevated hemoglobin A1c      Comment:  HEMOGLOBIN A1C (%) Date Value 03/06/2015 5.6                05/20/2014 5.4 05/08/2012 6.0 (H)   No results found for:               POCA1C   11/21/2012: Hyperopia with astigmatism and presbyopia  No date: Hypertension  11/21/2012: Immature cataract  01/21/2012: Impingement syndrome of right shoulder      Comment:  10/14: pt taking  [place] med (meloxicam 7.5 mg BID                and cyclobenzaprine); Will ask for refill once supply                finished; PCP ok with refill of flexeril on temporary                basis;   01/26/2008: Irritable bladder      Comment:  Seen by Dr Jeraldine Loots 5/09, no incontinence, likely                irritable bladder.  Trial of anticholinergic, refer to                 urology, has appt Dr Minna Antis 02/23/08  02/16/2005: Leiomyoma of uterus, unspecified      Comment:  Dr Jeraldine Loots hysterectomy November, 2006; cervix retained,               last pap 11/11 neg Repeat pap/HPV 07/2015   02/15/2014: Mass of hand  07/30/2011: Other plastic surgery for unacceptable cosmetic appearance      Comment:  Breast augmentation scheduled 08/02/11;  Dr.  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   eyes  06/18/2004: Urinary calculus, unspecified    PROBLEM LIST  Patient Active Problem List:     Class  1 obesity due to excess calories without serious comorbidity with body mass index (BMI) of 32.0 to 32.9 in adult     Pure hypercholesterolemia     Family history of malignant neoplasm of gastrointestinal tract     Tubular adenoma of colon     Hyperopia with astigmatism and presbyopia     Immature cataract     Hypertension, goal below 140/90     Adenomatous polyp of colon     Squamous 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 eyes     Alcohol abuse     Depression, recurrent (HCC)     Trauma and stressor-related disorder     Nontraumatic complete tear of right rotator cuff     Localized osteoarthritis of left knee      SURGICAL HISTORY    Past Surgical History:  No date: BREAST ENHANCEMENT SURGERY      Comment:  12/12 had bilateral mammoplasty and saline implants, had               2nd surgery 5/30 for revision  No date: MAMMAPLASTY AUGMENTATION W/PROSTHETIC IMPLANT  No date: MASTOPEXY  No date: OB ANTEPARTUM CARE CESAREAN DLVR & POSTPARTUM      Comment:  x3  No date: REDUCTION MAMMAPLASTY  No date: TOTAL ABDOMINAL HYSTERECT W/WO RMVL TUBE OVARY      Comment:  for fibroids, still has cervix    CURRENT MEDICATIONS    No current facility-administered medications for this encounter.    Current Outpatient Medications:     ibuprofen (ADVIL) 600 MG tablet, Take 1 tablet by mouth every 8 (eight) hours as  needed for Pain pain  for up to 10 days, Disp: 20 tablet, Rfl: 0    triamcinolone (KENALOG) 0.1 % ointment, Apply topically 2 (two) times daily, Disp: 80 g, Rfl: 1    hydrochlorothiazide (HYDRODIURIL) 25 MG tablet, TAKE 1 TABLET BY MOUTH DAILY, Disp: 30 tablet, Rfl: 11    omeprazole (PRILOSEC) 40 MG capsule, Take 1 capsule by mouth daily, Disp: 30 capsule, Rfl: 11    DULoxetine (CYMBALTA) 30 MG capsule, Take 1 capsule by mouth daily, Disp: 30 capsule, Rfl: 11    traZODone (DESYREL) 50 MG tablet, Take 1 tablet by mouth nightly, Disp: 30 tablet, Rfl: 11    polyethylene glycol (GLYCOLAX/MIRALAX) 17 g packet, Take 1 packet by mouth daily, Disp: 30 Package, Rfl: 0    ALLERGIES    Review of Patient's Allergies indicates:  No Known Allergies    FAMILY HISTORY    Review of patient's family history indicates:  Problem: Cancer - Other      Relation: Father          Age of Onset: (Not Specified)          Comment: stomach  Problem: Heart      Relation: Brother          Age of Onset: (Not Specified)          Comment: died at 62 during valve replacement  Problem: Heart      Relation: Brother          Age of Onset: (Not Specified)          Comment: chestpain with neg w/u  Problem: Heart      Relation: Mother          Age of Onset: (Not Specified)          Comment: Died of MI age 55  Problem: Diabetes  Relation: Mother          Age of Onset: (Not Specified)      SOCIAL HISTORY    Social History     Socioeconomic History    Marital status: Divorced     Spouse name: Not on file    Number of children: Not on file    Years of education: Not on file    Highest education level: Not on file   Occupational History    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; 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   Tobacco Use    Smoking status: Former Smoker     Types: Cigarettes     Quit date: 07/27/2001     Years since quitting: 18.7    Smokeless tobacco: Never Used   Substance and Sexual Activity    Alcohol use: Yes     Alcohol/week: 6.0 standard  drinks     Types: 6 Cans of beer per week     Comment: 6 beers per week     Drug use: No    Sexual activity: Not Currently     Partners: Male     Comment: G3P3, no hx STD, no hx abn Pap   Other Topics Concern    Not on file   Social History Narrative    08/03/19 Divorced, has 3 children, lives alone, works as a Education administrator lady    Social Determinants of Librarian, academic Strain:     Difficulty of Paying Living Expenses:   Food Insecurity:     Worried About Charity fundraiser in the Last Year:     Arboriculturist in the Last Year:   Transportation Needs:     Film/video editor (Medical):     Lack of Transportation (Non-Medical):   Physical Activity:     Days of Exercise per Week:     Minutes of Exercise per Session:   Stress:     Feeling of Stress :   Social Connections:     Frequency of Communication with Friends and Family:     Frequency of Social Gatherings with Friends and Family:     Attends Religious Services:     Active Member of Clubs or Organizations:     Attends Music therapist:     Marital Status:   Intimate Partner Violence:     Fear of Current or Ex-Partner:     Emotionally Abused:     Physically Abused:     Sexually Abused:     REVIEW OF SYSTEMS    The pertinent positives are reviewed in the HPI above. All other systems were reviewed and are negative.    PHYSICAL EXAM      BP 160/86    Pulse 75    Temp 97.1 F    Resp 16    Wt 72.6 kg (160 lb)    LMP 07/11/2005    SpO2 96%    BMI 32.04 kg/m   GENERAL:  Well-appearing, no distress.  SKIN: Warm & Dry, no rash, no bruising.  HEAD: Right forehead hematoma, very slight bruising  No crepitus.  No associated abrasion, laceration or bleeding  EYES: PERRL. EOMI.     Dark purple ecchymosis over the upper and lower lid.  No specific bony orbital tenderness.    No scleral injection, no proptosis and extraocular movements without pain or limitation    ENT: Oropharynx clear.  NECK:  Supple with full  painless ROM at  the neck    HEART:  Regular rate   MUSCULOSKELETAL:  No deformities. Well-perfused extremities with  2+ DP/PT/Rad pulses bilaterally. No cyanosis or edema.  No injury to the upper or lower extremities    RESULTS  No results found for this visit on 04/25/20 (from the past 24 hour(s)).     RADIOLOGY  CT HEAD:   CLINICAL INDICATION: head trauma     COMPARISON: 12/22/2011     TECHNIQUE: Contiguous scans from skull base to vertex without contrast and reconstructions in coronal plane. Radiation dose reduction techniques were employed. CTDIvol: 60.1 mGy. DLP: 4128 mGy-cm.      FINDINGS:     Brain: Gray-white differentiation is maintained. There is no hemorrhage or abnormal mass effect.     Ventricles and CSF spaces: Normal in size and morphology.     Sinuses: The visualized paranasal sinuses are clear.     Mastoid air cells: Chronic partial sclerosis     Orbits: Unremarkable.     Bones: The calvarial vault and skull base are intact.      Extracranial: Mild soft tissue swelling of the scalp overlying the intact right frontal bone     IMPRESSION:     No CT evidence of acute intracranial abnormality.   Mild soft tissue swelling in the scalp overlying the intact right frontal bone           Reviewed and Electronically Signed by: Eda Paschal MD   Signed Date/Time: 04-25-2020 12:02:43                    CT FACIAL:   CLINICAL INDICATION: head trauma, forehead hematoma, orbital bruising r>l     COMPARISON: None     TECHNIQUE:   Noncontrast CT was performed from at the base of the skull to base of the mandible in bone and soft tissue windows with coronal and sagittal planes were done.   Radiation dose: Radiation dose reduction techniques were employed. CTDIvol: 37.6 mGy. DLP: 837 mGy-cm.     FINDINGS:   Bones: No evidence of facial bone fracture.     Orbits: Intact bony orbits. Intact intraorbital structures including globes.     Sinus/mastoids: Mild mucosal thickening in the visualized paranasal sinuses. No  air-fluid levels     Soft tissues: There is mild soft tissue swelling in the scalp overlying the intact right frontal bone. No underlying fracture or hemorrhage     Other:     IMPRESSION:     No evidence for facial bone fracture     Mild soft tissue swelling in the scalp overlying the intact right frontal bone         Reviewed and Electronically Signed by: Eda Paschal MD   Signed Date/Time: 04-25-2020 12:07:04                  MEDICATIONS ADMINISTERED ON THIS VISIT  Orders Placed This Encounter      acetaminophen (TYLENOL) tablet 650 mg      ibuprofen (ADVIL) 600 MG tablet          Sig: Take 1 tablet by mouth every 8 (eight) hours as needed for Pain pain  for up to 10 days          Dispense:  20 tablet          Refill:  0      ED COURSE & MEDICAL DECISION MAKING      I reviewed the patient's past medical history/problem list,  past surgical history, medication list, social history and allergies. Pt remained hemodynamically stable during their stay in the emergency department.       ED Decision Making & Course:   This is a 59 year old female presents to the emergency department for evaluation of head and facial trauma initially developing after a fall 4 days ago.    She has a nonfocal neurologic examination although her right forehead is still hurting with more significant bruising particularly around the right eye as documented above.    CT head and facial bones obtained.  These were within normal limits.  Use Tylenol and ibuprofen and the reasons to return to the ED outlined.    Condition: Stable  Disposition: Home      Diagnosis:   Nonintractable headache, unspecified chronicity pattern, unspecified headache type  [employer], PA-C

## 2020-04-28 LAB — EKG

## 2020-04-28 MED FILL — IBUPROFEN 600MG: 10 days supply | Qty: 20 | Fill #0

## 2020-04-30 ENCOUNTER — Telehealth (HOSPITAL_BASED_OUTPATIENT_CLINIC_OR_DEPARTMENT_OTHER): Payer: Self-pay

## 2020-04-30 ENCOUNTER — Encounter (HOSPITAL_BASED_OUTPATIENT_CLINIC_OR_DEPARTMENT_OTHER): Payer: Self-pay

## 2020-04-30 ENCOUNTER — Other Ambulatory Visit (HOSPITAL_BASED_OUTPATIENT_CLINIC_OR_DEPARTMENT_OTHER): Payer: Self-pay | Admitting: Family Medicine

## 2020-04-30 DIAGNOSIS — D126 Benign neoplasm of colon, unspecified: Secondary | ICD-10-CM

## 2020-04-30 MED FILL — TRAZODONE  50MG: 30 days supply | Qty: 30 | Fill #5

## 2020-04-30 MED FILL — DULOXETINE 30MG: 30 days supply | Qty: 30 | Fill #5

## 2020-04-30 MED FILL — OMEPRAZOLE 40MG: 30 days supply | Qty: 30 | Fill #5

## 2020-04-30 MED FILL — HYDROCHLOROT 25MG: 30 days supply | Qty: 30 | Fill #5

## 2020-04-30 NOTE — Telephone Encounter (Signed)
Called patient to schedule colonoscopy/egd for gerd and check for parasite and T, unable to reach, left message to contact open access at [phone]      only have egd referral sent ordering provider an staff message to place the colonscopy order patient needed a 5 yr follow up for TA in 6/21  With Dr Marcelene Butte

## 2020-04-30 NOTE — Progress Notes (Signed)
we received an egd referral, I noted when looking at chart that patient needed a 5 year follow up in 01/2020 for tubular adenoma  I sent referring md a staff message to add a colonoscopy referral .  Will schedule for both procedures.

## 2020-05-01 ENCOUNTER — Ambulatory Visit (HOSPITAL_BASED_OUTPATIENT_CLINIC_OR_DEPARTMENT_OTHER): Payer: No Typology Code available for payment source | Admitting: Family Medicine

## 2020-05-07 ENCOUNTER — Other Ambulatory Visit (HOSPITAL_BASED_OUTPATIENT_CLINIC_OR_DEPARTMENT_OTHER): Payer: Self-pay

## 2020-05-07 MED ORDER — POLYETHYLENE GLYCOL 3350 17 GM/SCOOP PO POWD
238.00 g | ORAL | 0 refills | Status: AC
Start: 2020-05-07 — End: 2021-05-07

## 2020-05-07 MED ORDER — SIMETHICONE 80 MG PO TABS
4.00 | ORAL_TABLET | ORAL | 0 refills | Status: AC
Start: 2020-05-07 — End: 2021-05-07

## 2020-05-07 MED ORDER — BISACODYL 5 MG PO TBEC
20.00 mg | DELAYED_RELEASE_TABLET | ORAL | 0 refills | Status: AC
Start: 2020-05-07 — End: 2021-05-07

## 2020-05-07 MED FILL — POLYETH GLYC POW 3350 NF: 1 days supply | Qty: 238 | Fill #0

## 2020-05-07 MED FILL — MI-ACID GAS CHW 80MG: 1 days supply | Qty: 4 | Fill #0

## 2020-05-07 MED FILL — BISACODYL 5MG EC: 1 days supply | Qty: 4 | Fill #0

## 2020-05-07 NOTE — Telephone Encounter (Signed)
Called and spoke with patient to schedule a colonoscopy/egd.  Procedure/preparation and need for ride explained.  Medical history reviewed.  Patient scheduled on 13/3 Miralax/Dulcolax prep reviewed and sent.    Instrues para colonoscopia  Preparao com Miralax e Dulcolax    O QUE COMPRAR   4 comprimidos de Dulcolax (ou o laxante genrico "Bisacodyl")   1 frasco grande de Miralax (8,3 onas ou 238 gramas)   64 onas (2 litros) de Gatorade (nenhum vermelho/roxo)  7 DIAS ANTES DO EXAME         NO TOMAR: medicamentos anti-inflamatrios no esteroides, que incluem Ibuprofeno, Motrin, Advil, Naproxeno, Aleve, Naprosyn, Meloxicam e muitos outros   Voc pode tomar Tylenol (que  acetaminofeno) para dor   Alguns estados clnicos exigem que se utilize aspirina. Verifique com o seu prestador de cuidados primrios. No interrompa a aspirina antes de falar com o seu profissional de sade.   3 DIAS ANTES DO EXAME       Interrompa o consumo de alimentos ricos em fibras, como milho, feijo, sementes, nozes, pes integrais ou peles de frutas (pera, [place] etc.)    2 DIAS ANTES DO EXAME   Faa um jantar leve antes das 19 horas    1 DIA ANTES DO EXAME   Inicie uma dieta de lquidos claros (transparentes)    So lquidos claros No so lquidos claros   gua, Gatorade, Powerade ou Pedialyte Nada vermelho ou roxo   Caf preto ou ch (sem leite, creme) Nada alcolico   Caldo claro Nada de leite, creme, outros produtos lcteos   Ginger Ale ou Sprite Nada de [place], arroz ou vegetais em sopas   Suco de [place] Nada de suco com polpa   Gelatina, picols  Nenhum lquido que no seja transparente      No consuma alimentos slidos durante todo o dia   Pela manh: tome 2 comprimidos de dulcolax   Prepare o laxante: misture 64 oz (2 litros) de Gatorade com 8,3 oz (238 gramas) de Miralax, depois guarde na geladeira   A partir das 16 horas, beba um copo (8 oz ou 240 ml) do laxante a cada 30 minutos at [employer]. Certifique-se de  ficar perto de um banheiro assim que iniciar a preparao.   s 21 horas, tome 2 comprimidos contra gases (simeticona) com 1 copo (8 oz ou 240 ml) de lquido claro   s 22 horas, tome mais 2 comprimidos contra gases (simeticona) com 1 copo (8 oz ou 240 ml) de lquido claro   Antes de deitar: tome 2 comprimidos de dulcolax    NO DIA DA COLONOSCOPIA   Na manh do exame, voc pode tomar seus outros medicamentos na hora de costume com apenas um gole d'gua. Esses medicamentos incluem comprimidos para presso arterial, anticonvulsivos, remdios para o corao, a tireoide etc.   No tome comprimidos para diabetes. Caso tenha diabetes, siga as instrues de preparao para colonoscopia contidas no folheto para diabticos   No beba nada nas 6 horas anteriores ao exame

## 2020-05-14 ENCOUNTER — Ambulatory Visit: Payer: No Typology Code available for payment source | Attending: Family Medicine | Admitting: Family Medicine

## 2020-05-14 ENCOUNTER — Encounter (HOSPITAL_BASED_OUTPATIENT_CLINIC_OR_DEPARTMENT_OTHER): Payer: Self-pay | Admitting: Family Medicine

## 2020-05-14 ENCOUNTER — Other Ambulatory Visit: Payer: Self-pay

## 2020-05-14 VITALS — BP 134/80 | HR 83 | Temp 97.7°F | Ht 59.17 in | Wt 162.4 lb

## 2020-05-14 DIAGNOSIS — Z0000 Encounter for general adult medical examination without abnormal findings: Secondary | ICD-10-CM | POA: Diagnosis present

## 2020-05-14 DIAGNOSIS — Z23 Encounter for immunization: Secondary | ICD-10-CM

## 2020-05-14 DIAGNOSIS — L989 Disorder of the skin and subcutaneous tissue, unspecified: Secondary | ICD-10-CM | POA: Diagnosis present

## 2020-05-14 DIAGNOSIS — E669 Obesity, unspecified: Secondary | ICD-10-CM | POA: Insufficient documentation

## 2020-05-14 DIAGNOSIS — B659 Schistosomiasis, unspecified: Secondary | ICD-10-CM | POA: Diagnosis present

## 2020-05-14 LAB — COMPREHENSIVE METABOLIC PANEL
ALANINE AMINOTRANSFERASE: 62 U/L — ABNORMAL HIGH (ref 12–45)
ALBUMIN: 3.6 g/dL (ref 3.4–5.0)
ALKALINE PHOSPHATASE: 92 U/L (ref 45–117)
ANION GAP: 8 mmol/L (ref 5–15)
ASPARTATE AMINOTRANSFERASE: 33 U/L (ref 8–34)
BILIRUBIN TOTAL: 0.4 mg/dL (ref 0.2–1.0)
BUN (UREA NITROGEN): 17 mg/dL (ref 7–18)
CALCIUM: 9.2 mg/dL (ref 8.5–10.1)
CARBON DIOXIDE: 27 mmol/L (ref 21–32)
CHLORIDE: 105 mmol/L (ref 98–107)
CREATININE: 0.7 mg/dL (ref 0.4–1.2)
ESTIMATED GLOMERULAR FILT RATE: >60 mL/min (ref >60)
Glucose Random: 86 mg/dL (ref 74–160)
POTASSIUM: 3.5 mmol/L (ref 3.5–5.1)
SODIUM: 140 mmol/L (ref 136–145)
TOTAL PROTEIN: 7.7 g/dL (ref 6.4–8.2)

## 2020-05-14 LAB — CBC, PLATELET & DIFFERENTIAL
ABSOLUTE BASO COUNT: 0.1 10*3/uL (ref 0.0–0.1)
ABSOLUTE EOSINOPHIL COUNT: 0.1 10*3/uL (ref 0.0–0.8)
ABSOLUTE IMM GRAN COUNT: 0.04 10*3/uL — ABNORMAL HIGH (ref 0.00–0.03)
ABSOLUTE LYMPH COUNT: 4.2 10*3/uL (ref 0.6–5.9)
ABSOLUTE MONO COUNT: 0.7 10*3/uL (ref 0.2–1.4)
ABSOLUTE NEUTROPHIL COUNT: 5.5 10*3/uL (ref 1.6–8.3)
ABSOLUTE NRBC COUNT: 0 10*3/uL (ref 0.0–0.0)
BASOPHIL %: 0.7 % (ref 0.0–1.2)
EOSINOPHIL %: 1 % (ref 0.0–7.0)
HEMATOCRIT: 39.5 % (ref 34.1–44.9)
HEMOGLOBIN: 13.2 g/dL (ref 11.2–15.7)
IMMATURE GRANULOCYTE %: 0.4 % (ref 0.0–0.4)
LYMPHOCYTE %: 39.9 % (ref 15.0–54.0)
MEAN CORP HGB CONC: 33.4 g/dL (ref 31.0–37.0)
MEAN CORPUSCULAR HGB: 31.7 pg (ref 26.0–34.0)
MEAN CORPUSCULAR VOL: 95 fl (ref 80.0–100.0)
MEAN PLATELET VOLUME: 11.5 fL (ref 8.7–12.5)
MONOCYTE %: 6.3 % (ref 4.0–13.0)
NEUTROPHIL %: 51.7 % (ref 40.0–75.0)
NRBC %: 0 % (ref 0.0–0.0)
PLATELET COUNT: 263 10*3/uL (ref 150–400)
RBC DISTRIBUTION WIDTH STD DEV: 46.4 fL — ABNORMAL HIGH (ref 35.1–46.3)
RED BLOOD CELL COUNT: 4.16 M/uL (ref 3.90–5.20)
WHITE BLOOD CELL COUNT: 10.6 10*3/uL (ref 4.0–11.0)

## 2020-05-14 LAB — VITAMIN D,25 HYDROXY: VITAMIN D,25 HYDROXY: 60 ng/mL (ref 30.0–100.0)

## 2020-05-14 LAB — VITAMIN B12: VITAMIN B12: 237 pg/mL (ref 193–986)

## 2020-05-14 LAB — URINALYSIS
BILIRUBIN, URINE: NEGATIVE
GLUCOSE, URINE: NEGATIVE MG/DL
KETONE, URINE: NEGATIVE MG/DL
LEUKOCYTE ESTERASE: NEGATIVE
NITRITE, URINE: NEGATIVE
OCCULT BLOOD, URINE: NEGATIVE
PH URINE: 5.5 (ref 5.0–8.0)
PROTEIN, URINE: NEGATIVE MG/DL
SPECIFIC GRAVITY URINE: 1.025 (ref 1.003–1.035)

## 2020-05-14 LAB — C-REACTIVE PROTEIN HIGH SENS: C-REACTIVE PROTEIN HIGH SENS: 1.1 mg/dL (ref 0.0–0.3)

## 2020-05-14 LAB — LIPID PANEL
Cholesterol: 238 mg/dL (ref 0–239)
HIGH DENSITY LIPOPROTEIN: 73 mg/dL (ref 40–?)
LOW DENSITY LIPOPROTEIN DIRECT: 127 mg/dL (ref 0–189)
TRIGLYCERIDES: 204 mg/dL — ABNORMAL HIGH (ref 0–150)

## 2020-05-14 LAB — FOLATE: FOLATE: 8.3 ng/mL (ref 3.0–?)

## 2020-05-14 LAB — MAGNESIUM: MAGNESIUM: 2.3 mg/dL (ref 1.8–2.4)

## 2020-05-14 LAB — THYROID SCREEN TSH REFLEX FT4: THYROID SCREEN TSH REFLEX FT4: 0.84 u[IU]/mL (ref 0.358–3.740)

## 2020-05-14 NOTE — Progress Notes (Signed)
CC: gen f/u    #)weight  -going to weight loss doctor in [place]  -will be on phentermine and a weight loss injection (daily, not sure what the name is)    [email or web address]    #)lump on side of face  -would like this removed    #)schistosomiasis  -still having abdominal sx  -taking praziquantel on Monday as she doesn't have to work that day      ROS:  -no fever  -no cough    O:  BP 134/80    Pulse 83    Temp 97.7 F (36.5 C) (Temporal)    Ht 4' 11.17" (1.503 m)    Wt 73.7 kg (162 lb 6.4 oz)    LMP 07/11/2005    SpO2 97%    Breastfeeding No    BMI 32.61 kg/m   Gen: NAD  HEENT: MMM  Pulm: normal effort  Psych: normal speech and thought  Skin: no rash, 1.5cm flesh colored papule on left temple    (E66.9) Obesity (BMI 30-39.9)  (primary encounter diagnosis)  Comment: ordered labs requested by outside doctor, will forward  Plan: CBC, PLATELET & DIFFERENTIAL, COMPREHENSIVE         METABOLIC PANEL, DHEA SULFATE, C-REACTIVE         PROTEIN HIGH SENS, LIPID PANEL, MAGNESIUM,         THYROID SCREEN TSH REFLEX FT4, URINALYSIS,         VITAMIN D,25 HYDROXY, VITAMIN B12, FOLATE            (Z23) Need for zoster vaccination  Comment:   Plan: IMMUNIZATION ADMIN SINGLE, HZV ZOSTER VACC         RECOMBINANT ADJUVANTED IM NJX            (Z23) Need for prophylactic vaccination and inoculation against influenza  Comment:   Plan: IIV4 VACC PRESERV FREE AGE 81 MONTHS AND OLDER,         0.5ML, IM            (L98.9) Lesion of face  Comment:   Plan: REFERRAL TO PLASTIC SURGERY ( INT)            (B65.9) Schistosomiasis  Comment: advised this should improve once she's taken the praziquantel  Plan:

## 2020-05-14 NOTE — Progress Notes (Signed)
05/14/2020  VIS given prior to administration and reviewed with the patient and or legal guardian. Patient understands the disease and the vaccine. See immunization/Injection module or chart review for date of publication and additional information.  Dallie Dad, LPN

## 2020-05-16 LAB — DHEA SULFATE: DHEA SULFATE: 11.9 ug/dL — AB (ref 29.4–220.5)

## 2020-05-20 ENCOUNTER — Ambulatory Visit (HOSPITAL_BASED_OUTPATIENT_CLINIC_OR_DEPARTMENT_OTHER): Payer: Self-pay | Admitting: Registered Nurse

## 2020-05-20 NOTE — Telephone Encounter (Signed)
Regarding: ongoing diarrhea  ----- Message from Brennan Bailey, [place] sent at 05/20/2020 11:14 AM EDT -----  Garen Grams [phone], 59 year old, female    Calls today:  Sick   What are the symptoms diarrhea not going away, Dr Derrill Memo is aware and was suppose to call patient. Pt also wants test results from 05/14/2020  How long has patient been sick? 3 weeks  What has pt. tried at home pt says she is taking 2 medications but doesn't know the name   Person calling on behalf of patient: Patient (self)    CALL BACK NUMBER: [phone]    Patient's language of care: [place] ([place])    Patient needs a [place] interpreter.    Patient's PCP: Lemar Livings, MD

## 2020-05-30 ENCOUNTER — Encounter (HOSPITAL_BASED_OUTPATIENT_CLINIC_OR_DEPARTMENT_OTHER): Payer: Self-pay

## 2020-05-30 ENCOUNTER — Other Ambulatory Visit: Payer: Self-pay

## 2020-05-30 ENCOUNTER — Ambulatory Visit
Admission: RE | Admit: 2020-05-30 | Discharge: 2020-05-30 | Disposition: A | Discharge status: Home / Self Care | Payer: No Typology Code available for payment source | Attending: Family Medicine | Admitting: Family Medicine

## 2020-05-30 DIAGNOSIS — Z0000 Encounter for general adult medical examination without abnormal findings: Secondary | ICD-10-CM

## 2020-05-30 DIAGNOSIS — Z1231 Encounter for screening mammogram for malignant neoplasm of breast: Secondary | ICD-10-CM | POA: Insufficient documentation

## 2020-06-02 MED FILL — TRAZODONE  50MG: 30 days supply | Qty: 30 | Fill #6

## 2020-06-02 MED FILL — OMEPRAZOLE 40MG: 30 days supply | Qty: 30 | Fill #6

## 2020-06-02 MED FILL — HYDROCHLOROT 25MG: 30 days supply | Qty: 30 | Fill #6

## 2020-06-02 MED FILL — DULOXETINE 30MG: 30 days supply | Qty: 30 | Fill #6

## 2020-06-13 ENCOUNTER — Encounter (HOSPITAL_BASED_OUTPATIENT_CLINIC_OR_DEPARTMENT_OTHER): Payer: Self-pay

## 2020-06-19 ENCOUNTER — Encounter (HOSPITAL_BASED_OUTPATIENT_CLINIC_OR_DEPARTMENT_OTHER): Payer: Self-pay | Admitting: Family Medicine

## 2020-06-19 ENCOUNTER — Ambulatory Visit: Payer: No Typology Code available for payment source | Attending: Family Medicine | Admitting: Family Medicine

## 2020-06-19 ENCOUNTER — Other Ambulatory Visit: Payer: Self-pay

## 2020-06-19 VITALS — BP 145/80 | HR 88 | Temp 96.9°F | Ht 59.17 in | Wt 165.0 lb

## 2020-06-19 DIAGNOSIS — R059 Cough, unspecified: Secondary | ICD-10-CM | POA: Insufficient documentation

## 2020-06-19 DIAGNOSIS — M25562 Pain in left knee: Secondary | ICD-10-CM | POA: Diagnosis not present

## 2020-06-19 DIAGNOSIS — G8929 Other chronic pain: Secondary | ICD-10-CM | POA: Diagnosis not present

## 2020-06-19 DIAGNOSIS — M5431 Sciatica, right side: Secondary | ICD-10-CM | POA: Diagnosis not present

## 2020-06-19 DIAGNOSIS — M25511 Pain in right shoulder: Secondary | ICD-10-CM | POA: Insufficient documentation

## 2020-06-19 DIAGNOSIS — I10 Essential (primary) hypertension: Secondary | ICD-10-CM

## 2020-06-19 DIAGNOSIS — E669 Obesity, unspecified: Secondary | ICD-10-CM

## 2020-06-19 MED ORDER — LOSARTAN POTASSIUM 25 MG PO TABS
25.0000 mg | ORAL_TABLET | Freq: Every day | ORAL | 1 refills | Status: DC
Start: 2020-06-19 — End: 2020-09-25

## 2020-06-19 MED ORDER — NABUMETONE 500 MG PO TABS
500.0000 mg | ORAL_TABLET | Freq: Two times a day (BID) | ORAL | 1 refills | Status: DC
Start: 2020-06-19 — End: 2020-08-19

## 2020-06-19 MED ORDER — DULOXETINE HCL 60 MG PO CPEP
60.0000 mg | ORAL_CAPSULE | Freq: Every day | ORAL | 4 refills | Status: DC
Start: 2020-06-19 — End: 2020-12-20

## 2020-06-19 MED ORDER — GUAIFENESIN 100 MG/5ML PO LIQD
200.00 mg | Freq: Three times a day (TID) | ORAL | 0 refills | Status: AC | PRN
Start: 2020-06-19 — End: 2020-06-26

## 2020-06-19 MED ORDER — LIDOCAINE 5 % EX PTCH
1.00 | MEDICATED_PATCH | CUTANEOUS | 4 refills | Status: AC
Start: 2020-06-19 — End: 2020-10-20

## 2020-06-19 MED FILL — DULOXETINE 60MG: 30 days supply | Qty: 30 | Fill #0

## 2020-06-19 MED FILL — LOSARTAN POT 25MG: 30 days supply | Qty: 30 | Fill #0

## 2020-06-19 MED FILL — NABUMETONE 500MG: 30 days supply | Qty: 60 | Fill #0

## 2020-06-19 MED FILL — LIDOCAINE PATCH 5%: 60 days supply | Qty: 60 | Fill #0

## 2020-06-19 NOTE — Progress Notes (Signed)
CC: buttock pain    #)buttock pain  -fell on stairs a few weeks back  -but fell on other side  -now on right side has pain in buttock that extends down leg, painful to walk, to get out of chair  -has tried meloxicam, tylenol: no benefit    #)right shoulder  -was advised surgery, held off 2/2 pandemic    #)cough  -was cleaning house, used a lot of products  -had some throat irritation, had some coughing   -is a little better now; also had COVID test-was negative    #)lesion on face  -still interested in appt with surgery for removal    #)leg cramps  -happening, interested in something for this    #)abdomen  -some pain intermittently, but overall much improved since praziquantel treatment    #)weight  -interested in some weight loss treatments  -was seeing doc in [place], would prefer to be here  -concerned weight is contributing to her knee pain    ROS:  -no fever  -no cough    O:  BP (!) 138/90    Pulse 88    Temp 96.9 F (36.1 C) (Temporal)    Ht 4' 11.17" (1.503 m)    Wt 74.8 kg (165 lb)    LMP 07/11/2005    SpO2 95%    BMI 33.13 kg/m   Gen: NAD  HEENT: MMM  Pulm: normal effort  Psych: normal speech and thought  Skin: no rash    (M54.31) Sciatica of right side  (primary encounter diagnosis)  Comment: provided home exercises, nabumetone Rx. Advised no other NSAIDs while taking  Plan:     (M25.511,  G89.29) Chronic right shoulder pain  (M25.562,  G89.29) Chronic pain of left knee  Comment: referred to outside ortho for second opinion at pt request, increasing duloxetine to 60mg  daily to see if this better helps with pain. Lidocaine patches provided as well  Plan: REFERRAL TO ORTHOPEDICS (EXT)            (I10) Hypertension, goal below 140/90  Comment: adding losartan 25mg  daily, will follow up in two weeks  Plan:     (E66.9) Obesity (BMI 30-39.9)  Comment: likely contributing to body pains, will see back in two weeks. If BP at target will discuss initiation of phentermine and/or ozempic or victoza.   Plan:      #)cough  -suspect related to chemicals mixing  -advised to wear N95 level mask when cleaning like that for better protection, mucinex Rx provided    I spent a total of 40 minutes on this visit on the date of service (total time includes all activities performed on the date of service)

## 2020-06-19 NOTE — Patient Instructions (Signed)
Patient Education   Index Spanish     Adult Advisor 2019.4 published by [employer].  Last reviewed: 2016-01-28

## 2020-06-25 ENCOUNTER — Other Ambulatory Visit: Payer: Self-pay

## 2020-07-04 ENCOUNTER — Telehealth (HOSPITAL_BASED_OUTPATIENT_CLINIC_OR_DEPARTMENT_OTHER): Payer: Self-pay | Admitting: Family Medicine

## 2020-07-04 ENCOUNTER — Encounter (HOSPITAL_BASED_OUTPATIENT_CLINIC_OR_DEPARTMENT_OTHER): Payer: Self-pay | Admitting: Family Medicine

## 2020-07-04 ENCOUNTER — Ambulatory Visit: Payer: No Typology Code available for payment source | Attending: Family Medicine | Admitting: Family Medicine

## 2020-07-04 ENCOUNTER — Other Ambulatory Visit: Payer: Self-pay

## 2020-07-04 VITALS — BP 126/76 | HR 77 | Temp 97.2°F | Ht 59.76 in | Wt 164.6 lb

## 2020-07-04 DIAGNOSIS — I10 Essential (primary) hypertension: Secondary | ICD-10-CM | POA: Insufficient documentation

## 2020-07-04 DIAGNOSIS — M5431 Sciatica, right side: Secondary | ICD-10-CM | POA: Diagnosis not present

## 2020-07-04 DIAGNOSIS — E669 Obesity, unspecified: Secondary | ICD-10-CM | POA: Diagnosis present

## 2020-07-04 MED ORDER — SEMAGLUTIDE(0.25 OR 0.5MG/DOS) 2 MG/1.5ML SC SOPN
PEN_INJECTOR | SUBCUTANEOUS | 0 refills | Status: DC
Start: 2020-07-04 — End: 2020-07-11

## 2020-07-04 NOTE — Telephone Encounter (Signed)
Prior authorization request for OZEMPIC INJ 2/1.5ML (RED BOX)       Patient has HSNO for prescription coverage.  I.D.# [id number]

## 2020-07-04 NOTE — Progress Notes (Signed)
CC: f/u several things    #)hypertension  -here for hypertension follow up  -currently taking losartan and HCTZ  -BP is at target    #)weight  -interested in starting ozempic  -BP at target today, so we feel good about starting    #)sciatica  -improving, saw chiropractor      ROS:  -no fever  -no cough    O:  BP 126/76    Pulse 77    Temp 97.2 F (36.2 C) (Temporal)    Ht 4' 11.76" (1.518 m)    Wt 74.7 kg (164 lb 9.6 oz)    LMP 07/11/2005    SpO2 97%    Breastfeeding No    BMI 32.40 kg/m   Gen: NAD  HEENT: MMM  Pulm: normal effort  Psych: normal speech and thought  Skin: no rash    (E66.9) Obesity (BMI 30-39.9)  (primary encounter diagnosis)  Comment: starting ozempic 0.25mg  weekly, counseled on use (showed videos of how to self administer)  Plan: f/u in one month, will likely increase to 0.5mg  weekly at that time    (I10) Hypertension, goal below 140/90  Comment: BP at target, continue meds as taking  Plan:     #)sciatica  -continue exercises and meds as needed

## 2020-07-08 MED FILL — HYDROCHLOROT 25MG: 30 days supply | Qty: 30 | Fill #7 | Status: CP

## 2020-07-08 MED FILL — TRAZODONE  50MG: 30 days supply | Qty: 30 | Fill #7 | Status: CP

## 2020-07-08 MED FILL — OMEPRAZOLE 40MG: 30 days supply | Qty: 30 | Fill #7 | Status: CP

## 2020-07-08 NOTE — Telephone Encounter (Signed)
Stephanie [name] is very expensive with 340B. Victoza is actually covered by patient's insurance without PA but I didn't mention that because I wasn't sure if it was appropriate you were prescribing Ozempic for weight-loss and not diabetes. But if you think it's appropriate you can definitely send a prescription for Victoza since it's covered

## 2020-07-08 NOTE — Telephone Encounter (Signed)
I am not sure. When I ran the test claim I did a general test claim with Victoza 18MG /3ML and it went through. So I think it should be fine unless you put for weight-loss in the directions or prescribe it like you will prescribe saxenda. At this point I think you should just prescribe it and see if it will be covered and go from there

## 2020-07-08 NOTE — Telephone Encounter (Signed)
PLEASE NOTE: Patient has [place] they do not cover weight-loss medications. I ran a test claim for Xenical, Contrave, Qsymia, Phemtermine & Saxenda & they are all not covered. The 340B for Contrave is: $22.99; Xenical & Phentermine are $14.99.  Ozempic is over $100. Please review

## 2020-07-09 ENCOUNTER — Other Ambulatory Visit (HOSPITAL_BASED_OUTPATIENT_CLINIC_OR_DEPARTMENT_OTHER): Payer: Self-pay | Admitting: Podiatrist

## 2020-07-09 MED FILL — OZEMPIC INJ 2/1.5ML (0.25MG OR 0.5MG) (RED BOX) (1 PEN): 56 days supply | Qty: 2 | Fill #0

## 2020-07-11 MED ORDER — VICTOZA 18 MG/3ML SC SOPN
0.6000 mg | PEN_INJECTOR | Freq: Every day | SUBCUTANEOUS | 0 refills | Status: DC
Start: 2020-07-11 — End: 2021-05-11

## 2020-07-11 MED FILL — VICTOZA INJ 18MG/3ML: 30 days supply | Qty: 6 | Fill #0

## 2020-07-11 NOTE — Telephone Encounter (Signed)
Call via [place] telephone interpreter  Discussed medication note below with the patient   Patient verbalizes her understanding   " I will pick up the [place] now from the pharmacy"      [place] team,   Kindly outreach pt and inform her that med was changed from ozempic to [place] for insurance reasons. Victoza will be a daily injection (0.6mg ) SQ. Thanks!   Ysidro Evert

## 2020-07-13 MED FILL — LOSARTAN POT 25MG: 30 days supply | Qty: 30 | Fill #1

## 2020-07-18 ENCOUNTER — Ambulatory Visit (HOSPITAL_BASED_OUTPATIENT_CLINIC_OR_DEPARTMENT_OTHER): Payer: Self-pay | Admitting: Plastic Surgery

## 2020-07-28 MED FILL — MI-ACID GAS CHW 80MG: 1 days supply | Qty: 4 | Fill #0

## 2020-07-28 MED FILL — POLYETH GLYC POW (BOTTLE): 1 days supply | Qty: 238 | Fill #0

## 2020-07-28 MED FILL — BISACODYL 5MG EC: 1 days supply | Qty: 4 | Fill #0

## 2020-08-01 ENCOUNTER — Encounter (HOSPITAL_BASED_OUTPATIENT_CLINIC_OR_DEPARTMENT_OTHER): Payer: Self-pay

## 2020-08-01 ENCOUNTER — Ambulatory Visit
Admission: RE | Admit: 2020-08-01 | Discharge: 2020-08-01 | Disposition: A | Discharge status: Home / Self Care | Payer: No Typology Code available for payment source | Attending: Family Medicine | Admitting: Family Medicine

## 2020-08-01 ENCOUNTER — Encounter (HOSPITAL_BASED_OUTPATIENT_CLINIC_OR_DEPARTMENT_OTHER): Payer: Self-pay | Admitting: Anesthesiology

## 2020-08-01 ENCOUNTER — Ambulatory Visit (HOSPITAL_BASED_OUTPATIENT_CLINIC_OR_DEPARTMENT_OTHER): Payer: Self-pay | Admitting: Family Medicine

## 2020-08-01 ENCOUNTER — Ambulatory Visit (HOSPITAL_BASED_OUTPATIENT_CLINIC_OR_DEPARTMENT_OTHER): Payer: Self-pay | Admitting: Anesthesiology

## 2020-08-01 DIAGNOSIS — Z86010 Personal history of colonic polyps: Secondary | ICD-10-CM | POA: Insufficient documentation

## 2020-08-01 DIAGNOSIS — Z87891 Personal history of nicotine dependence: Secondary | ICD-10-CM | POA: Insufficient documentation

## 2020-08-01 DIAGNOSIS — I10 Essential (primary) hypertension: Secondary | ICD-10-CM | POA: Diagnosis not present

## 2020-08-01 DIAGNOSIS — D126 Benign neoplasm of colon, unspecified: Secondary | ICD-10-CM

## 2020-08-01 DIAGNOSIS — F339 Major depressive disorder, recurrent, unspecified: Secondary | ICD-10-CM | POA: Insufficient documentation

## 2020-08-01 DIAGNOSIS — K449 Diaphragmatic hernia without obstruction or gangrene: Secondary | ICD-10-CM | POA: Insufficient documentation

## 2020-08-01 DIAGNOSIS — Z1211 Encounter for screening for malignant neoplasm of colon: Secondary | ICD-10-CM | POA: Diagnosis not present

## 2020-08-01 DIAGNOSIS — K31A0 Gastric intestinal metaplasia, unspecified: Secondary | ICD-10-CM

## 2020-08-01 DIAGNOSIS — D122 Benign neoplasm of ascending colon: Secondary | ICD-10-CM | POA: Diagnosis not present

## 2020-08-01 DIAGNOSIS — K5730 Diverticulosis of large intestine without perforation or abscess without bleeding: Secondary | ICD-10-CM

## 2020-08-01 DIAGNOSIS — E785 Hyperlipidemia, unspecified: Secondary | ICD-10-CM | POA: Diagnosis not present

## 2020-08-01 DIAGNOSIS — K219 Gastro-esophageal reflux disease without esophagitis: Secondary | ICD-10-CM | POA: Diagnosis not present

## 2020-08-01 DIAGNOSIS — K2950 Unspecified chronic gastritis without bleeding: Secondary | ICD-10-CM | POA: Insufficient documentation

## 2020-08-01 DIAGNOSIS — R1013 Epigastric pain: Secondary | ICD-10-CM

## 2020-08-01 DIAGNOSIS — Z79899 Other long term (current) drug therapy: Secondary | ICD-10-CM | POA: Insufficient documentation

## 2020-08-01 MED ORDER — PROPOFOL 500 MG/50 ML IV
Freq: Once | INTRAVENOUS | Status: DC | PRN
Start: 2020-08-01 — End: 2020-08-01
  Administered 2020-08-01: 50 mg via INTRAVENOUS
  Administered 2020-08-01: 200 ug/kg/min via INTRAVENOUS
  Administered 2020-08-01: 100 ug/kg/min via INTRAVENOUS
  Administered 2020-08-01: 30 mg via INTRAVENOUS
  Administered 2020-08-01: 70 mg via INTRAVENOUS
  Administered 2020-08-01: 140 ug/kg/min via INTRAVENOUS
  Administered 2020-08-01: 30 mg via INTRAVENOUS
  Administered 2020-08-01: 300 ug/kg/min via INTRAVENOUS

## 2020-08-01 MED ORDER — FENTANYL CITRATE 0.05 MG/ML IJ SOLN
INTRAMUSCULAR | Status: AC
Start: 2020-08-01 — End: 2020-08-01
  Filled 2020-08-01: qty 2

## 2020-08-01 MED ORDER — LACTATED RINGERS IV SOLN
INTRAVENOUS | Status: DC
Start: 2020-08-01 — End: 2020-08-02

## 2020-08-01 MED ORDER — PROPOFOL INFUSION
INTRAVENOUS | Status: AC
Start: 2020-08-01 — End: 2020-08-01
  Filled 2020-08-01: qty 50

## 2020-08-01 MED ORDER — FENTANYL CITRATE 0.05 MG/ML IJ SOLN
Freq: Once | INTRAMUSCULAR | Status: DC | PRN
Start: 2020-08-01 — End: 2020-08-01
  Administered 2020-08-01: 50 ug via INTRAVENOUS

## 2020-08-01 MED ORDER — LIDOCAINE HCL (PF) 2 % IJ SOLN
INTRAMUSCULAR | Status: AC
Start: 2020-08-01 — End: 2020-08-01
  Filled 2020-08-01: qty 5

## 2020-08-01 NOTE — Anesthesia Preprocedure Evaluation (Signed)
Pre-Anesthetic Note  .  [place] AN TELEVISIT:   Is this a televisit?: No          Patient: Stephanie [name] is a 59 year old female      Procedure Information     Date/Time: 08/01/20 0730    Scheduled providers: Junius Argyle, MD; Bertell Chauntelle, MD    Procedures:       EGD      COLONOSCOPY    Diagnosis:       Gastroesophageal reflux disease, unspecified whether esophagitis present [K21.9]      Tubular adenoma of colon [D12.6]    Location: [facility] - Gastroenterology          Relevant Problems   CARDIO   (+) Hypertension, goal below 140/90       _0 @      Previous Anesthetic History:   Past Surgical History:  No date: BREAST ENHANCEMENT SURGERY      Comment:  12/12 had bilateral mammoplasty and saline implants, had               2nd surgery 5/30 for revision  No date: MAMMAPLASTY AUGMENTATION W/PROSTHETIC IMPLANT  No date: MASTOPEXY  No date: OB ANTEPARTUM CARE CESAREAN DLVR & POSTPARTUM      Comment:  x3  No date: REDUCTION MAMMAPLASTY  No date: TOTAL ABDOMINAL HYSTERECT W/WO RMVL TUBE OVARY      Comment:  for fibroids, still has cervix    Current Medications:    liraglutide (VICTOZA) 18 MG/3ML pen injector, Inject 0.6 mg under the skin daily, Disp: 3 mL, Rfl: 0  DULoxetine (CYMBALTA) 60 MG capsule, Take 1 capsule by mouth daily Stop taking duloxetine 34m, Disp: 30 capsule, Rfl: 4  lidocaine (LIDODERM) 5 % patch, Place 1 patch onto the skin daily . Patch(es) may remain in place for up to 12 hours in any 24-hour period., Disp: 60 patch, Rfl: 4  nabumetone (RELAFEN) 500 MG tablet, Take 1 tablet by mouth 2 (two) times daily, Disp: 60 tablet, Rfl: 1  losartan (COZAAR) 25 MG tablet, Take 1 tablet by mouth daily, Disp: 30 tablet, Rfl: 1  polyethylene glycol (GLYCOLAX) 17 GM/SCOOP powder, Take 238 g by mouth See Admin Instructions Take as directed prior to colonoscopy, Disp: 238 g, Rfl: 0  bisacodyl (DULCOLAX) 5 MG EC tablet, Take 4 tablets by mouth See Admin Instructions Take as  directed prior to colonoscopy, Disp: 4 tablet, Rfl: 0  Simethicone 80 MG TABS, Take 4 tablets by mouth See Admin Instructions Take as directed prior to colonoscopy., Disp: 4 tablet, Rfl: 0  hydrochlorothiazide (HYDRODIURIL) 25 MG tablet, TAKE 1 TABLET BY MOUTH DAILY, Disp: 30 tablet, Rfl: 11  omeprazole (PRILOSEC) 40 MG capsule, Take 1 capsule by mouth daily, Disp: 30 capsule, Rfl: 11  traZODone (DESYREL) 50 MG tablet, Take 1 tablet by mouth nightly, Disp: 30 tablet, Rfl: 11    lactated ringers infusion, , Intravenous, Continuous, KJunius Argyle MD        Home Medications  (Not in a hospital admission)      Allergies:   Review of Patient's Allergies indicates:  No Known Allergies    Smoking, Alcohol, Drugs:  Social History    Tobacco Use      Smoking status: Former Smoker        Types: Cigarettes        Quit date: 07/27/2001        Years since quitting: 1420  Smokeless tobacco: Never Used    Alcohol use: Yes      Alcohol/week: 6.0 standard drinks      Types: 6 Cans of beer per week      Comment: 6 beers per week       Drug use: No       PMHx:  Past Medical History:  02/12/2016: Chronic right shoulder pain  No date: Depression  03/20/2012: Diverticulosis of colon      Comment:  Noted on colonoscopy 6/13   08/16/2010: Elevated hemoglobin A1c      Comment:  HEMOGLOBIN A1C (%) Date Value 03/06/2015 5.6                05/20/2014 5.4 05/08/2012 6.0 (H)   No results found for:               POCA1C   11/21/2012: Hyperopia with astigmatism and presbyopia  No date: Hypertension  11/21/2012: Immature cataract  01/21/2012: Impingement syndrome of right shoulder      Comment:  10/14: pt taking  [place] med (meloxicam 7.5 mg BID                and cyclobenzaprine); Will ask for refill once supply                finished; PCP ok with refill of flexeril on temporary                basis;   01/26/2008: Irritable bladder      Comment:  Seen by Dr Jeraldine Loots 5/09, no incontinence, likely                irritable bladder.  Trial of  anticholinergic, refer to                urology, has appt Dr Minna Antis 02/23/08  02/16/2005: Leiomyoma of uterus, unspecified      Comment:  Dr Jeraldine Loots hysterectomy November, 2006; cervix retained,               last pap 11/11 neg Repeat pap/HPV 07/2015   02/15/2014: Mass of hand  07/30/2011: Other plastic surgery for unacceptable cosmetic appearance      Comment:  Breast augmentation scheduled 08/02/11;  Dr.  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   Physical Activity in METs greater than 4 (limited by knee)    Pulmonary: Negative for asthma, obstructive sleep apnea, COPD, cough, home oxygen, recent URI and tobacco use.   GU/Renal: Negative.    Hepatic: Negative.    Neurological: Negative for seizures and strokes.   Gastrointestinal: Positive for GERD.   Endocrine: Negative for hyperthyroidism and hypothyroidism.   Psychiatric: Positive for depression.    Other:BMI greater than 30 kg/m2      Physical Exam    General     Level of consciousness:  Alert and oriented (time, person, place)   no respiratory distress syndrome   Airway     Mallampati:  III  TM distance:  >3 FB    Mouth opening:  >3 FB    Neck ROM:  Full   Teeth    (+) upper dentures  }   Heart      Lungs              Pertinent Labs:   Lab Results   Component Value Date    NA 140 05/14/2020    K 3.5 05/14/2020    CREAT 0.7 05/14/2020    GLUCOSER 86 05/14/2020    WBC 10.6 05/14/2020    HCT 39.5 05/14/2020    PLTA 263 05/14/2020    PT 10.4 04/22/2008    APTT 25.9 04/22/2008    INR 1.0 (L) 04/22/2008         Anesthesia Plan    ASA Score:     ASA:  3    Airway:      Mallampati:  III    Mouth opening:   >3 FB    Neck ROM:  Full    TM distance:  >3 FB    Plan: MAC and general    Other information:     EKG Reviewed: : No      Full Stomach Precaution:: No      Post-Plan::  PACU    Anesthesia Assessment and Plan:        [place] interpreter [phone]    Informed Consent:     Anesthetic plan and risks discussed with:  Patient   Patient Consented        Attending Anesthesiologist Statement:     Reassessed day of surgery: Yes        Assessment made, necessary equipment and appropriate plan in place.

## 2020-08-01 NOTE — H&P (Deleted)
GI Pre-procedure History and Physical Short Form  Stephanie [name] is an59 year old female.    Chief Complaint: She is being scheduled for Both upper GI endoscopy and colonoscopy    The history is provided by the patient. No language interpreter was used.           Active Problems:  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     Alcohol abuse     Depression, recurrent (HCC)     Trauma and stressor-related disorder     Nontraumatic complete tear of right rotator cuff     Localized osteoarthritis of left knee     Obesity (BMI 30-39.9)      History (Medical, Surgical, Social, Family):  Past Medical History:  02/12/2016: Chronic right shoulder pain  No date: Depression  03/20/2012: Diverticulosis of colon      Comment:  Noted on colonoscopy 6/13   08/16/2010: Elevated hemoglobin A1c      Comment:  HEMOGLOBIN A1C (%) Date Value 03/06/2015 5.6                05/20/2014 5.4 05/08/2012 6.0 (H)   No results found for:               POCA1C   11/21/2012: Hyperopia with astigmatism and presbyopia  No date: Hypertension  11/21/2012: Immature cataract  01/21/2012: Impingement syndrome of right shoulder      Comment:  10/14: pt taking  [place] med (meloxicam 7.5 mg BID                and cyclobenzaprine); Will ask for refill once supply                finished; PCP ok with refill of flexeril on temporary                basis;   01/26/2008: Irritable bladder      Comment:  Seen by Dr Jeraldine Loots 5/09, no incontinence, likely                irritable bladder.  Trial of anticholinergic, refer to                urology,  has appt Dr Minna Antis 02/23/08  02/16/2005: Leiomyoma of uterus, unspecified      Comment:  Dr Jeraldine Loots hysterectomy November, 2006; cervix retained,               last pap 11/11 neg Repeat pap/HPV 07/2015   02/15/2014: Mass of hand  07/30/2011: Other plastic surgery for unacceptable cosmetic appearance      Comment:  Breast augmentation scheduled 08/02/11;  Dr.  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   eyes  06/18/2004: Urinary calculus, unspecified  Past Surgical History:  No date: BREAST ENHANCEMENT SURGERY  Comment:  12/12 had bilateral mammoplasty and saline implants, had               2nd surgery 5/30 for revision  No date: MAMMAPLASTY AUGMENTATION W/PROSTHETIC IMPLANT  No date: MASTOPEXY  No date: OB ANTEPARTUM CARE CESAREAN DLVR & POSTPARTUM      Comment:  x3  No date: REDUCTION MAMMAPLASTY  No date: TOTAL ABDOMINAL HYSTERECT W/WO RMVL TUBE OVARY      Comment:  for fibroids, still has cervix  Social History     Socioeconomic History    Marital status: Divorced     Spouse name: Not on file    Number of children: Not on file    Years of education: Not on file    Highest education level: Not on file   Occupational History    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; 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   Tobacco Use    Smoking status: Former Smoker     Types: Cigarettes     Quit date: 07/27/2001     Years since quitting: 19.0    Smokeless tobacco: Never Used   Substance and Sexual Activity    Alcohol use: Yes     Alcohol/week: 6.0 standard drinks     Types: 6 Cans of beer per week     Comment: 6 beers per week     Drug use: No    Sexual activity: Not Currently     Partners: Male     Comment: G3P3, no hx STD, no hx abn Pap   Other Topics Concern    Not on file   Social History Narrative    08/03/19 Divorced, has 3 children, lives alone, works as a Education administrator lady    Social Determinants of Librarian, academic  Strain:     Difficulty of Paying Living Expenses: Not on file  Food Insecurity:     Worried About Charity fundraiser in the Last Year: Not on file    [place] in the Last Year: Not on file  Transportation Needs:     Lack of Transportation (Medical): Not on file    Lack of Transportation (Non-Medical): Not on file  Physical Activity:     Days of Exercise per Week: Not on file    Minutes of Exercise per Session: Not on file  Stress:     Feeling of Stress : Not on file  Social Connections:     Frequency of Communication with Friends and Family: Not on file    Frequency of Social Gatherings with Friends and Family: Not on file    Attends Religious Services: Not on file    Active Member of Clubs or Organizations: Not on file    Attends Archivist Meetings: Not on file    Marital Status: Not on file  Intimate Partner Violence:     Fear of Current or Ex-Partner: Not on file    Emotionally Abused: Not on file    Physically Abused: Not on file    Sexually Abused: Not on file  Review of patient's family history indicates:  Problem: Cancer - Other      Relation: Father          Age of Onset: (Not Specified)          Comment: stomach  Problem: Heart      Relation: Brother          Age of Onset: (Not Specified)  Comment: died at 50 during valve replacement  Problem: Heart      Relation: Brother          Age of Onset: (Not Specified)          Comment: chestpain with neg w/u  Problem: Heart      Relation: Mother          Age of Onset: (Not Specified)          Comment: Died of MI age 56  Problem: Diabetes      Relation: Mother          Age of Onset: (Not Specified)      Allergies:   Review of Patient's Allergies indicates:  No Known Allergies    Medications:   liraglutide (VICTOZA) 18 MG/3ML pen injector, Inject 0.6 mg under the skin daily, Disp: 3 mL, Rfl: 0  DULoxetine (CYMBALTA) 60 MG capsule, Take 1 capsule by mouth daily Stop taking duloxetine 30mg , Disp: 30  capsule, Rfl: 4  lidocaine (LIDODERM) 5 % patch, Place 1 patch onto the skin daily . Patch(es) may remain in place for up to 12 hours in any 24-hour period., Disp: 60 patch, Rfl: 4  nabumetone (RELAFEN) 500 MG tablet, Take 1 tablet by mouth 2 (two) times daily, Disp: 60 tablet, Rfl: 1  losartan (COZAAR) 25 MG tablet, Take 1 tablet by mouth daily, Disp: 30 tablet, Rfl: 1  polyethylene glycol (GLYCOLAX) 17 GM/SCOOP powder, Take 238 g by mouth See Admin Instructions Take as directed prior to colonoscopy, Disp: 238 g, Rfl: 0  bisacodyl (DULCOLAX) 5 MG EC tablet, Take 4 tablets by mouth See Admin Instructions Take as directed prior to colonoscopy, Disp: 4 tablet, Rfl: 0  Simethicone 80 MG TABS, Take 4 tablets by mouth See Admin Instructions Take as directed prior to colonoscopy., Disp: 4 tablet, Rfl: 0  hydrochlorothiazide (HYDRODIURIL) 25 MG tablet, TAKE 1 TABLET BY MOUTH DAILY, Disp: 30 tablet, Rfl: 11  omeprazole (PRILOSEC) 40 MG capsule, Take 1 capsule by mouth daily, Disp: 30 capsule, Rfl: 11  traZODone (DESYREL) 50 MG tablet, Take 1 tablet by mouth nightly, Disp: 30 tablet, Rfl: 11    lactated ringers infusion, , Intravenous, Continuous, Junius Argyle, MD        Vitals:  BP 143/84    Pulse 88    Temp 97 F (36.1 C) (Temporal)    Resp 19    LMP 07/11/2005     Review of Systems   Respiratory: Negative.    Cardiovascular: Negative.    Gastrointestinal: Negative.    Neurological: Negative.        Physical Exam  Cardiovascular:      Rate and Rhythm: Normal rate and regular rhythm.      Pulses: Normal pulses.      Heart sounds: Murmur heard.     Pulmonary:      Effort: Pulmonary effort is normal.      Breath sounds: Normal breath sounds.             Airway Evaluation:  Gag reflex intact: Yes  Ability to open mouth wide:   Full  Dentures:  .Marland Kitchen Limited  Loose teeth:  No  Neck range of motion  Full    Mallampati AirwayClassification: Class II   The same as Class I except the tonsilar pillars are hidden by the  tongue.  Mallampati Airway Classification:     ASA Classification: ASA Class II (a patient with mild systemic disease)    Assessment:  Proceed with procedure

## 2020-08-01 NOTE — Anesthesia Postprocedure Evaluation (Signed)
Anesthesia Post-Operative Evaluation Note    Patient: Stephanie [name]           Procedure Summary     Date: 08/01/20 Room / Location: [facility] - Gastroenterology    Anesthesia Start: [phone] Anesthesia Stop: 0919    Procedures:       EGD      COLONOSCOPY Diagnosis:       Gastroesophageal reflux disease, unspecified whether esophagitis present      Tubular adenoma of colon    Scheduled Providers: Junius Argyle, MD; Bertell Krystel, MD Responsible Provider: Bertell Marda, MD    Anesthesia Type: MAC, general ASA Status: 3            POST-OPERATIVE EVALUATION    Anesthesia Post Evaluation    Vitals signs in patient's normal range: Yes  Respiratory function stable; airway patent: Yes  Cardiovascular function stable: Yes  Hydration status stable: Yes  Mental status recovered; patient participates in evaluation and/or is at baseline: Yes  Pain control satisfactory: Yes  Nausea and vomiting control satisfactory: Yes    Procedure was labor & delivery no  PostOP disposition PACU  Anesthesia Observation no significant observation      Last vitals  Vitals Value Taken Time   BP 133/90 08/01/20 0935   Temp 98.2 F (36.8 C) 08/01/20 0921   Pulse 73 08/01/20 0936   Resp 8 08/01/20 0937   SpO2 98 % 08/01/20 0937   Vitals shown include unvalidated device data.

## 2020-08-01 NOTE — PROVATION-GI (Signed)
[facility]  Patient Name: Stephanie [name]  MRN: [id number]  CSN: [id number]  Date of Birth: [birth year]  Admit Type: Outpatient  Age: 59  Gender: Female  Note Status: Finalized  Patient Location: [place]  Referring MD:          Tawana Scale. [place] MD, MD  Procedure Date:        08/01/2020 7:36:29 AM  Procedure:             Colonoscopy  Endoscopist:           Junius Argyle MD, MD  Indications for Procedure:       High risk colon cancer surveillance: Personal history of colonic polyps  Medications:           Monitored Anesthesia Care  Procedure:       Just prior to the procedure, an updated history and physical was done. I        obtained an informed consent from the patient reviewing the risk of the        procedure including (but not limited to) respiratory depression, perforation,        bleeding, discomfort, a possible need for surgery and unexpected reactions to        medications. The patient is aware that test has limitations and may not        detect significant lesions such as cancer or other potential diseases. The        patient was also informed that they might need a repeat colonoscopy earlier        than standard guidelines if there are changes in their symptoms or concerning        findings noted. A time out was performed with the entire procedure staff        present. The scope was passed under direct vision. Throughout the procedure,        the patient's blood pressure, pulse, and oxygen saturations were monitored        continuously. The [id number] was introduced through the anus and        advanced to the cecum, identified by appendiceal orifice and ileocecal valve.        The scope was then slowly withdrawn with confirmation of the noted findings.        The colonoscopy was performed without difficulty. The patient tolerated the        procedure well. The quality of the bowel preparation was good. The total        duration of the procedure was 12 minutes. Scope withdrawal time was 6  minutes.  Findings:       A diminutive polyp was found in the ascending colon. The polyp was removed        with a jumbo cold forceps. Resection and retrieval were complete.       A few diverticula were found in the sigmoid colon.  Post Procedure Diagnosis:       - One diminutive polyp in the ascending colon, removed with a jumbo cold        forceps. Resected and retrieved.       - Diverticulosis in the sigmoid colon.  Complications:         No immediate complications.  Estimated Blood Loss:  Estimated blood loss: none.  Recommendation:       - Await pathology results.       - Repeat colonoscopy in 5 years for surveillance.  Junius Argyle MD, MD  08/01/2020  9:18:45 AM  This report has been signed electronically.  Number of Addenda: 0  Note Initiated On: 08/01/2020 7:36 AM

## 2020-08-01 NOTE — Discharge Instructions (Signed)
[facility] DISCHARGE INSTRUCTIONS     Quando voc for para casa  possvel que se sinta sonolento(a). Descanse bastante pelo resto do dia.   When you return home you may feel sleepy. Get plenty of rest for the remainder of the day.     Se voc tiver recebido algum sedative para o procedimento NO DIRIJA, NO  MANUSEIE MQUINAS NEM TOME NENHUMA DECISO IMPORTANTE durante o resto do dia.  If you received sedation for your procedure DO NOT DRIVE, OPERATE MACHINERY, OR MAKE IMPORTANT DECISIONS for the remainder of the day.     Depois ter feito uma COLONOSCOPIA  normal ter gases e sentir inchao abdominal, mas se voc tiver uma DOR ABDOMINAL SEVERA entre em contato com o seu mdico imediatamente.   It is normal after having a COLONOSCOPY to feel a little gassy and bloated, but if you develop SEVERE ABDOMINAL PAIN call your doctor immediately.      Depois ter feito uma COLONOSCOPIA  normal ver uma pequena quantidade de sangue aps as primeiras evacuaes, mas se voc vir uma QUANTIDADE GRANDE DE SANGUE VERMELHO VIVO, entre em contato com o seu mdico imediatamente.  It is normal after having a COLONOSCOPY to see a small amount of blood after your first few bowel movements, but, if you see a LARGE AMOUNT OF BRIGHT RED BLOOD, call your doctor immediately.     Aps ter feito uma ENDOSCOPIA DIGESTIVA ALTA (GASTROENTEROSCOPIA)  normal se ter uma dor de garganta branda que pode durar por alguns dias, mas se voc tiver  DORES NO PEITO, FALTA DE AR, DIFICULDADE PARA ENGOLIR, VMITOS DE SANGUE VERMELHO VIVO OU SE NOTAR AS FEZES NEGRAS OU MARROM ESCURAS OU SE VOC SE SENTIR FRACO(A) OU CANSADO(A), entre em contato com o seu mdico imediatamente.    It is normal after having an UPPER ENDOSCOPY to develop a mild sore throat that will last for a few days, but, if you develop CHEST PAIN, SHORTNESS OF BREATH, DIFFICULTY SWALLOWING, VOMIT BRIGHT RED BLOOD, NOTICE YOUR BOWEL  MOVEMENTS ARE BLACK OR MAROON COLORED OR YOU FEEL WEAK AND TIRED, call your doctor immediately.          Entre em contato com o seu mdico se houver qualquer outro sintoma fora do normal.  Call you physician for any other unusual symptoms.     Se por qualquer razo voc no conseguir entrar em contato com o seu mdico v para a sala de Education officer, museum prxima.   If for any reason you are unable to reach your doctor go to the nearest Emergency Room.    [employer]es [employer]  Specific Instructions:

## 2020-08-01 NOTE — PROVATION-GI (Signed)
[facility]  Patient Name: Stephanie [name]  MRN: [id number]  CSN: [id number]  Date of Birth: Oct 06, 1960  Admit Type: Outpatient  Age: 59  Gender: Female  Note Status: Finalized  Patient Location: [place]  Referring MD:          Tawana Scale. [place] MD, MD  Procedure Date:        08/01/2020 7:37:32 AM  Procedure:             Upper GI endoscopy  Endoscopist:           Junius Argyle MD, MD  Indications for Procedure:       Epigastric abdominal pain, Heartburn  Medications:           Monitored Anesthesia Care  Procedure:       Just prior to the procedure, an updated history and physical was done. I        obtained an informed consent from the patient reviewing the risk of the        procedure including (but not limited to) respiratory depression, perforation,        bleeding, discomfort, a possible need for surgery and unexpected reactions to        medications. The patient is aware that test has limitations and may not        detect significant lesions such as cancer or other potential diseases. The        patient was also informed that they might need a repeat upper endoscopy        earlier than standard guidelines if there are changes in their symptoms or        concerning findings noted. A time out was performed with the entire procedure        staff present. The scope was passed under direct vision. Throughout the        procedure, the patient's blood pressure, pulse, and oxygen saturations were        monitored continuously. The [id number] was introduced through the        mouth, and advanced to the second part of duodenum. The upper GI endoscopy        was accomplished without difficulty. The patient tolerated the procedure well.  Findings:       Normal mucosa was found in the entire esophagus. Biopsies were taken with a        cold forceps for histology.       A small hiatal hernia was present.       Mildly erythematous mucosa was found in the gastric antrum. Biopsies were        taken with a cold  forceps for histology and Helicobacter pylori testing.       The examined duodenum was normal.  Post Procedure Diagnosis:       - Normal mucosa was found in the entire esophagus. Biopsied.       - Small hiatal hernia.       - Erythematous mucosa in the antrum. Biopsied.       - Normal examined duodenum.  Complications:         No immediate complications.  Estimated Blood Loss:  Estimated blood loss: none.  Recommendation:       - Await pathology results.  Junius Argyle MD, MD  08/01/2020 9:02:04 AM  This report has been signed electronically.  Number of Addenda: 0  Note Initiated On: 08/01/2020 7:37 AM

## 2020-08-01 NOTE — Significant Event (Signed)
Stephanie [name] on  all discharge criteria, was sitting in chair all dressed when she slipped off the chair. Pt never lost conciousness, vital signs did not change . After spealking with [place] interpreter, pt stated her feet slipped and she slipped off the chair.    Dr Marcelene Butte and Dr Clois Dupes were notified. A sers was notified.Pt was put back on stretcher and vital signs were monoitored.

## 2020-08-01 NOTE — H&P (Signed)
GI Pre-procedure History and Physical Short Form  Brunilda Eble is a59 year old female.    Chief Complaint: She is being scheduled for Both upper GI endoscopy and colonoscopy    The history is provided by the patient. A language interpreter was used.           Active Problems:  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     Alcohol abuse     Depression, recurrent (HCC)     Trauma and stressor-related disorder     Nontraumatic complete tear of right rotator cuff     Localized osteoarthritis of left knee     Obesity (BMI 30-39.9)      History (Medical, Surgical, Social, Family):  Past Medical History:  02/12/2016: Chronic right shoulder pain  No date: Depression  03/20/2012: Diverticulosis of colon      Comment:  Noted on colonoscopy 6/13   08/16/2010: Elevated hemoglobin A1c      Comment:  HEMOGLOBIN A1C (%) Date Value 03/06/2015 5.6                05/20/2014 5.4 05/08/2012 6.0 (H)   No results found for:               POCA1C   11/21/2012: Hyperopia with astigmatism and presbyopia  No date: Hypertension  11/21/2012: Immature cataract  01/21/2012: Impingement syndrome of right shoulder      Comment:  10/14: pt taking  [place] med (meloxicam 7.5 mg BID                and cyclobenzaprine); Will ask for refill once supply                finished; PCP ok with refill of flexeril on temporary                basis;   01/26/2008: Irritable bladder      Comment:  Seen by Dr Jeraldine Loots 5/09, no incontinence, likely                irritable bladder.  Trial of anticholinergic, refer to                urology,  has appt Dr Minna Antis 02/23/08  02/16/2005: Leiomyoma of uterus, unspecified      Comment:  Dr Jeraldine Loots hysterectomy November, 2006; cervix retained,               last pap 11/11 neg Repeat pap/HPV 07/2015   02/15/2014: Mass of hand  07/30/2011: Other plastic surgery for unacceptable cosmetic appearance      Comment:  Breast augmentation scheduled 08/02/11;  Dr.  Ancil Boozer Phone#: [phone]     01/08/2016: Schistosomiasis  08/13/2016: Squamous 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   eyes  06/18/2004: Urinary calculus, unspecified  Past Surgical History:  No date: BREAST ENHANCEMENT SURGERY  Comment:  12/12 had bilateral mammoplasty and saline implants, had               2nd surgery 5/30 for revision  No date: MAMMAPLASTY AUGMENTATION W/PROSTHETIC IMPLANT  No date: MASTOPEXY  No date: OB ANTEPARTUM CARE CESAREAN DLVR & POSTPARTUM      Comment:  x3  No date: REDUCTION MAMMAPLASTY  No date: TOTAL ABDOMINAL HYSTERECT W/WO RMVL TUBE OVARY      Comment:  for fibroids, still has cervix  Social History     Socioeconomic History    Marital status: Divorced     Spouse name: Not on file    Number of children: Not on file    Years of education: Not on file    Highest education level: Not on file   Occupational History    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; 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   Tobacco Use    Smoking status: Former Smoker     Types: Cigarettes     Quit date: 07/27/2001     Years since quitting: 19.0    Smokeless tobacco: Never Used   Substance and Sexual Activity    Alcohol use: Yes     Alcohol/week: 6.0 standard drinks     Types: 6 Cans of beer per week     Comment: 6 beers per week     Drug use: No    Sexual activity: Not Currently     Partners: Male     Comment: G3P3, no hx STD, no hx abn Pap   Other Topics Concern    Not on file   Social History Narrative    08/03/19 Divorced, has 3 children, lives alone, works as a Education administrator lady    Social Determinants of Librarian, academic  Strain:     Difficulty of Paying Living Expenses: Not on file  Food Insecurity:     Worried About Charity fundraiser in the Last Year: Not on file    [place] in the Last Year: Not on file  Transportation Needs:     Lack of Transportation (Medical): Not on file    Lack of Transportation (Non-Medical): Not on file  Physical Activity:     Days of Exercise per Week: Not on file    Minutes of Exercise per Session: Not on file  Stress:     Feeling of Stress : Not on file  Social Connections:     Frequency of Communication with Friends and Family: Not on file    Frequency of Social Gatherings with Friends and Family: Not on file    Attends Religious Services: Not on file    Active Member of Clubs or Organizations: Not on file    Attends Archivist Meetings: Not on file    Marital Status: Not on file  Intimate Partner Violence:     Fear of Current or Ex-Partner: Not on file    Emotionally Abused: Not on file    Physically Abused: Not on file    Sexually Abused: Not on file  Review of patient's family history indicates:  Problem: Cancer - Other      Relation: Father          Age of Onset: (Not Specified)          Comment: stomach  Problem: Heart      Relation: Brother          Age of Onset: (Not Specified)  Comment: died at 37 during valve replacement  Problem: Heart      Relation: Brother          Age of Onset: (Not Specified)          Comment: chestpain with neg w/u  Problem: Heart      Relation: Mother          Age of Onset: (Not Specified)          Comment: Died of MI age 31  Problem: Diabetes      Relation: Mother          Age of Onset: (Not Specified)      Allergies:   Review of Patient's Allergies indicates:  No Known Allergies    Medications:   liraglutide (VICTOZA) 18 MG/3ML pen injector, Inject 0.6 mg under the skin daily, Disp: 3 mL, Rfl: 0  DULoxetine (CYMBALTA) 60 MG capsule, Take 1 capsule by mouth daily Stop taking duloxetine 30mg , Disp: 30  capsule, Rfl: 4  lidocaine (LIDODERM) 5 % patch, Place 1 patch onto the skin daily . Patch(es) may remain in place for up to 12 hours in any 24-hour period., Disp: 60 patch, Rfl: 4  nabumetone (RELAFEN) 500 MG tablet, Take 1 tablet by mouth 2 (two) times daily, Disp: 60 tablet, Rfl: 1  losartan (COZAAR) 25 MG tablet, Take 1 tablet by mouth daily, Disp: 30 tablet, Rfl: 1  polyethylene glycol (GLYCOLAX) 17 GM/SCOOP powder, Take 238 g by mouth See Admin Instructions Take as directed prior to colonoscopy, Disp: 238 g, Rfl: 0  bisacodyl (DULCOLAX) 5 MG EC tablet, Take 4 tablets by mouth See Admin Instructions Take as directed prior to colonoscopy, Disp: 4 tablet, Rfl: 0  Simethicone 80 MG TABS, Take 4 tablets by mouth See Admin Instructions Take as directed prior to colonoscopy., Disp: 4 tablet, Rfl: 0  hydrochlorothiazide (HYDRODIURIL) 25 MG tablet, TAKE 1 TABLET BY MOUTH DAILY, Disp: 30 tablet, Rfl: 11  omeprazole (PRILOSEC) 40 MG capsule, Take 1 capsule by mouth daily, Disp: 30 capsule, Rfl: 11  traZODone (DESYREL) 50 MG tablet, Take 1 tablet by mouth nightly, Disp: 30 tablet, Rfl: 11    lactated ringers infusion, , Intravenous, Continuous, Junius Argyle, MD        Vitals:  BP 143/84    Pulse 88    Temp 97 F (36.1 C) (Temporal)    Resp 19    Ht 4\' 11"  (1.499 m)    Wt 70.8 kg (156 lb)    LMP 07/11/2005    SpO2 97%    BMI 31.51 kg/m     Review of Systems   Respiratory: Negative.    Cardiovascular: Negative.    Gastrointestinal: Negative.    Neurological: Negative.        Physical Exam  Cardiovascular:      Rate and Rhythm: Normal rate and regular rhythm.      Pulses: Normal pulses.      Heart sounds: Normal heart sounds.   Pulmonary:      Effort: Pulmonary effort is normal.      Breath sounds: Normal breath sounds.             Airway Evaluation:  Gag reflex intact: Yes  Ability to open mouth wide:   Full  Dentures:  Limited  Loose teeth:  No  Neck range of motion  Full    Mallampati AirwayClassification: Class III    Just the base of the uvula can be seen.  Mallampati Airway Classification:  ASA Classification: ASA Class II (a patient with mild systemic disease)    Assessment:  Proceed with procedure

## 2020-08-05 LAB — SURGICAL PATH SPECIMEN GASTROINTESTINAL

## 2020-08-06 ENCOUNTER — Encounter (HOSPITAL_BASED_OUTPATIENT_CLINIC_OR_DEPARTMENT_OTHER): Payer: Self-pay | Admitting: Gastroenterology

## 2020-08-08 ENCOUNTER — Ambulatory Visit (HOSPITAL_BASED_OUTPATIENT_CLINIC_OR_DEPARTMENT_OTHER): Payer: Self-pay | Admitting: Plastic Surgery

## 2020-08-12 MED FILL — HYDROCHLOROT 25MG: 30 days supply | Qty: 30 | Fill #8

## 2020-08-12 MED FILL — OMEPRAZOLE 40MG: 30 days supply | Qty: 30 | Fill #8

## 2020-08-12 MED FILL — DULOXETINE 60MG: 30 days supply | Qty: 30 | Fill #1

## 2020-08-12 MED FILL — TRAZODONE  50MG: 30 days supply | Qty: 30 | Fill #8

## 2020-08-15 ENCOUNTER — Other Ambulatory Visit: Payer: Self-pay

## 2020-08-15 ENCOUNTER — Ambulatory Visit: Payer: No Typology Code available for payment source | Attending: Plastic Surgery | Admitting: Plastic Surgery

## 2020-08-15 DIAGNOSIS — L720 Epidermal cyst: Secondary | ICD-10-CM | POA: Diagnosis present

## 2020-08-15 NOTE — Progress Notes (Signed)
Plastic Surgery    This is a 59 yo [place] speaking woman who I saw with an Astronomer on the phone. She is referred by her PCP with a mass on her face.    The mass is near her eye on the left. She states its been there for a couple years but it is now getting larger and it is itchy. No bleeding or infection.    PMH  Stable HTN. No CAD or CVA  NKDA  No tobacco    Exam: firm mobile mass left lateral brow area. Has an old punctum. No erythema. The mass measures 2.0 cm.  No discharge. Some flaky skin around it.    Plan: Mass that most likely represents an epidermal cyst but would like to biopsy to rule out Skin CA.    I explained the procedure to her in detail. All questions answered. We will book in [facility] procedure room.

## 2020-08-19 ENCOUNTER — Ambulatory Visit: Payer: No Typology Code available for payment source | Attending: Family Medicine | Admitting: Family Medicine

## 2020-08-19 ENCOUNTER — Other Ambulatory Visit: Payer: Self-pay

## 2020-08-19 ENCOUNTER — Encounter (HOSPITAL_BASED_OUTPATIENT_CLINIC_OR_DEPARTMENT_OTHER): Payer: Self-pay | Admitting: Family Medicine

## 2020-08-19 VITALS — BP 106/70 | HR 99 | Temp 97.6°F | Ht 59.57 in | Wt 158.2 lb

## 2020-08-19 DIAGNOSIS — I10 Essential (primary) hypertension: Secondary | ICD-10-CM | POA: Diagnosis present

## 2020-08-19 DIAGNOSIS — E669 Obesity, unspecified: Secondary | ICD-10-CM

## 2020-08-19 DIAGNOSIS — M7050 Other bursitis of knee, unspecified knee: Secondary | ICD-10-CM

## 2020-08-19 MED ORDER — TRIAMCINOLONE ACETONIDE 40 MG/ML IJ SUSP
20.00 mg | Freq: Once | INTRAMUSCULAR | 0 refills | Status: AC
Start: 2020-08-19 — End: 2020-08-19

## 2020-08-19 MED ORDER — MELOXICAM 15 MG PO TABS
15.0000 mg | ORAL_TABLET | Freq: Every day | ORAL | 2 refills | Status: DC
Start: 2020-08-19 — End: 2021-08-03

## 2020-08-19 MED FILL — MELOXICAM 15MG: 30 days supply | Qty: 30 | Fill #0

## 2020-08-19 NOTE — Progress Notes (Signed)
CC: f/u weight    #)weight  -using victoza, only used twice so far (was sick so didn't use it)  -last use was 2 days ago, no side effects  -has lost weight already  -I noticed this after visit, that pt had gotten victoza and not ozempic  -still using it weekly and with apparently good effect    #)BP  -BP at target    #)knee  -knee pain  -left knee  -below knee on medial aspect    ROS:  -no fever  -no cough    O;  BP 106/70    Pulse 99    Temp 97.6 F (36.4 C) (Temporal)    Ht 4' 11.57" (1.513 m)    Wt 71.8 kg (158 lb 3.2 oz)    LMP 07/11/2005    SpO2 94%    Breastfeeding No    BMI 31.35 kg/m   Gen: NAD  HEENT: MMM  Pulm: normal effort  Psych: normal speech and thought  Skin: no rash  MSK: left knee with exquisite point tenderness over pes anserine area      Risks/benefits of procedure reviewed with pt including but not limited to bleeding, infection, potential to be ineffective. Pt also advised that after lidocaine portion of injection wears off there may be worse pain for one to two days as the steroid takes action. Pt gave verbal consent.   Area identified on left knee and cleaned with alcohol. Topical anesthesia achieved with ethyl chloride spray and are injected with 2cc's of lidocaine without epi and 20mg  of kenalog. Pt tolerated procedure well.    (E66.9) Obesity (BMI 30-39.9)  (primary encounter diagnosis)  Comment: pt appears to have been using victoza, not ozempic and using it weekly. Pt already noticing weight improvement. May continue at weekly use for now given weight down.   Will discuss at next visit  Plan:     (I10) Hypertension, goal below 140/90  Comment: BP at target today  Plan:     (M70.50) Pes anserine bursitis  Comment: injected in office  Plan: triamcinolone acetonide (KENALOG) 40 MG/ML         injection, TRIAMCINOLONE ACETONIDE INJ-PER 10MG         (KENALOG), PR ARTHROCENTESIS ASPIR&/INJ MAJOR         JT/BURSA W/O [place]          I spent a total of 30 minutes on this visit on the  date of service (total time includes all activities performed on the date of service)

## 2020-09-07 MED FILL — HYDROCHLOROTHIAZIDE 25MG: 30 days supply | Qty: 30 | Fill #9

## 2020-09-07 MED FILL — DULOXETINE 60MG: 30 days supply | Qty: 30 | Fill #2

## 2020-09-07 MED FILL — TRAZODONE  50MG: 30 days supply | Qty: 30 | Fill #9

## 2020-09-10 ENCOUNTER — Ambulatory Visit
Admission: RE | Admit: 2020-09-10 | Discharge: 2020-09-10 | Disposition: A | Discharge status: Home / Self Care | Payer: No Typology Code available for payment source | Attending: Family Medicine | Admitting: Family Medicine

## 2020-09-10 DIAGNOSIS — Z20822 Contact with and (suspected) exposure to covid-19: Secondary | ICD-10-CM

## 2020-09-11 ENCOUNTER — Ambulatory Visit (HOSPITAL_BASED_OUTPATIENT_CLINIC_OR_DEPARTMENT_OTHER): Payer: No Typology Code available for payment source

## 2020-09-25 ENCOUNTER — Ambulatory Visit: Payer: No Typology Code available for payment source | Attending: Family Medicine | Admitting: Family Medicine

## 2020-09-25 ENCOUNTER — Telehealth (HOSPITAL_BASED_OUTPATIENT_CLINIC_OR_DEPARTMENT_OTHER): Payer: Self-pay

## 2020-09-25 DIAGNOSIS — R059 Cough, unspecified: Secondary | ICD-10-CM | POA: Insufficient documentation

## 2020-09-25 MED ORDER — LOSARTAN POTASSIUM 25 MG PO TABS
25.0000 mg | ORAL_TABLET | Freq: Every day | ORAL | 5 refills | Status: DC
Start: 2020-09-25 — End: 2021-05-11

## 2020-09-25 MED ORDER — BENZONATATE 100 MG PO CAPS
100.00 mg | ORAL_CAPSULE | Freq: Two times a day (BID) | ORAL | 0 refills | Status: AC | PRN
Start: 2020-09-25 — End: 2020-10-05

## 2020-09-25 MED FILL — BENZONATATE 100MG: 10 days supply | Qty: 20 | Fill #0

## 2020-09-25 MED FILL — LOSARTAN POT 25MG: 30 days supply | Qty: 30 | Fill #0

## 2020-09-25 NOTE — Telephone Encounter (Signed)
Call was made out to pt to schedule an in person appointment at [facility]. Pt did not answer. Left message on voicemail for a call back.         [name], PA-C  [facility] Televisit Followup Pool  Pls schedule [facility] visit for pt tomorrow or Monday . TY

## 2020-09-25 NOTE — Progress Notes (Signed)
Clinic Note:    SUBJECTIVE:  Stephanie [name] is a 60 year old female with the following active problem list:    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     Alcohol abuse     Depression, recurrent (HCC)     Trauma and stressor-related disorder     Nontraumatic complete tear of right rotator cuff     Localized osteoarthritis of left knee     Obesity (BMI 30-39.9)      CC:     1) cough  -x 2 wks ago  -productive, mucus   -cough worse at night  -reports she has had an issue with cough > 1 year  -got better and then worse, worse x 2 wks  -no sob, cp  -no fevers  -nasal congestion sometimes, reports she does have heartburn   -pt reports she had covid testing a wk ago at outside site- negative  -former light smoker 25 years ago, no hx of asthma   -taking omeprazole for heartburn       Past Medical History:  02/12/2016: Chronic right shoulder pain  No date: Depression  03/20/2012: Diverticulosis of colon      Comment:  Noted on colonoscopy 6/13   08/16/2010: Elevated hemoglobin A1c      Comment:  HEMOGLOBIN A1C (%) Date Value 03/06/2015 5.6                05/20/2014 5.4 05/08/2012 6.0 (H)   No results found for:               POCA1C   11/21/2012: Hyperopia with astigmatism and presbyopia  No date: Hypertension  11/21/2012: Immature cataract  01/21/2012: Impingement syndrome of right shoulder      Comment:  10/14: pt taking  [place] med (meloxicam 7.5 mg BID                and cyclobenzaprine); Will ask for refill once supply                finished; PCP ok with refill of flexeril on temporary                 basis;   01/26/2008: Irritable bladder      Comment:  Seen by Dr Jeraldine Loots 5/09, no incontinence, likely                irritable bladder.  Trial of anticholinergic, refer to                urology, has appt Dr Minna Antis 02/23/08  02/16/2005: Leiomyoma of uterus, unspecified      Comment:  Dr Jeraldine Loots hysterectomy November, 2006; cervix retained,               last pap 11/11 neg Repeat pap/HPV 07/2015   02/15/2014: Mass of hand  07/30/2011: Other plastic surgery for unacceptable cosmetic appearance      Comment:  Breast augmentation scheduled 08/02/11;  Dr.  Ancil Boozer Phone#: [phone]     01/08/2016: Schistosomiasis  08/13/2016: Squamous 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   eyes  06/18/2004: Urinary calculus, unspecified    Past Surgical History:  No date: BREAST ENHANCEMENT SURGERY      Comment:  12/12 had bilateral mammoplasty and saline implants, had               2nd surgery 5/30 for revision  No date: MAMMAPLASTY AUGMENTATION W/PROSTHETIC IMPLANT  No date: MASTOPEXY  No date: OB ANTEPARTUM CARE CESAREAN DLVR & POSTPARTUM      Comment:  x3  No date: REDUCTION MAMMAPLASTY  No date: TOTAL ABDOMINAL HYSTERECT W/WO RMVL TUBE OVARY      Comment:  for fibroids, still has cervix    meloxicam (MOBIC) 15 MG tablet, Take 1 tablet by mouth daily, Disp: 30 tablet, Rfl: 2  DULoxetine (CYMBALTA) 60 MG capsule, Take 1 capsule by mouth daily Stop taking duloxetine 30mg , Disp: 30 capsule, Rfl: 4  lidocaine (LIDODERM) 5 % patch, Place 1 patch onto the skin daily . Patch(es) may remain in place for up to 12 hours in any 24-hour period., Disp: 60 patch, Rfl: 4  losartan (COZAAR) 25 MG tablet, Take 1 tablet by mouth daily, Disp: 30 tablet, Rfl: 1  polyethylene glycol (GLYCOLAX) 17 GM/SCOOP powder, Take 238 g by mouth See Admin Instructions Take as directed prior to colonoscopy, Disp: 238 g, Rfl: 0  bisacodyl (DULCOLAX) 5 MG EC tablet, Take 4 tablets by mouth See Admin Instructions Take as directed prior to  colonoscopy, Disp: 4 tablet, Rfl: 0  Simethicone 80 MG TABS, Take 4 tablets by mouth See Admin Instructions Take as directed prior to colonoscopy., Disp: 4 tablet, Rfl: 0  hydrochlorothiazide (HYDRODIURIL) 25 MG tablet, TAKE 1 TABLET BY MOUTH DAILY, Disp: 30 tablet, Rfl: 11  omeprazole (PRILOSEC) 40 MG capsule, Take 1 capsule by mouth daily, Disp: 30 capsule, Rfl: 11  traZODone (DESYREL) 50 MG tablet, Take 1 tablet by mouth nightly, Disp: 30 tablet, Rfl: 11    No current facility-administered medications on file prior to visit.      Review of Patient's Allergies indicates:  No Known Allergies    Review of patient's family history indicates:  Problem: Cancer - Other      Relation: Father          Age of Onset: (Not Specified)          Comment: stomach  Problem: Heart      Relation: Brother          Age of Onset: (Not Specified)          Comment: died at 50 during valve replacement  Problem: Heart      Relation: Brother          Age of Onset: (Not Specified)          Comment: chestpain with neg w/u  Problem: Heart      Relation: Mother          Age of Onset: (Not Specified)          Comment: Died of MI age 56  Problem: Diabetes      Relation: Mother          Age of Onset: (Not Specified)      Social History     Socioeconomic History    Marital status: Divorced  Spouse name: Not on file    Number of children: Not on file    Years of education: Not on file    Highest education level: Not on file   Occupational History    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; 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   Tobacco Use    Smoking status: Former Smoker     Types: Cigarettes     Quit date: 07/27/2001     Years since quitting: 19.1    Smokeless tobacco: Never Used   Substance and Sexual Activity    Alcohol use: Yes     Alcohol/week: 6.0 standard drinks     Types: 6 Cans of beer per week     Comment: 6 beers per week     Drug use: No    Sexual activity: Not Currently     Partners: Male     Comment: G3P3, no hx STD, no hx abn Pap    Other Topics Concern    Not on file   Social History Narrative    08/03/19 Divorced, has 3 children, lives alone, works as a Education administrator lady    Social Determinants of Librarian, academic Strain:     Difficulty of Paying Living Expenses: Not on file  Food Insecurity:     Worried About Charity fundraiser in the Last Year: Not on file    [place] in the Last Year: Not on file  Transportation Needs:     Lack of Transportation (Medical): Not on file    Lack of Transportation (Non-Medical): Not on file  Physical Activity:     Days of Exercise per Week: Not on file    Minutes of Exercise per Session: Not on file  Stress:     Feeling of Stress : Not on file  Social Connections:     Frequency of Communication with Friends and Family: Not on file    Frequency of Social Gatherings with Friends and Family: Not on file    Attends Religious Services: Not on file    Active Member of Clubs or Organizations: Not on file    Attends Archivist Meetings: Not on file    Marital Status: Not on file  Intimate Partner Violence:     Fear of Current or Ex-Partner: Not on file    Emotionally Abused: Not on file    Physically Abused: Not on file    Sexually Abused: Not on file    OBJECTIVE:  LMP 07/11/2005   Speaking in full sentences             A/P:     (R05.9) Cough  (primary encounter diagnosis)  Comment: x years, worse x 2 wks.   Plan:   -in person for eval tomorrow if possible  -trial of tessalon  -cont w omeprazole  -if worsening sxs, pt to f/u sooner        1. The patient indicates understanding of these issues and agrees with the plan.  2.  The patient is given an After Visit Summary sheet that lists all of their medications with directions, their allergies, orders placed during this encounter, immunization dates, and follow- up instructions.  3. I reviewed the patient's medical information and medical history   4.  I reconciled the patient's medication list and prepared  and supplied needed refills.  5.  I have reviewed the past medical, family, and social history sections including the medications and allergies listed in  the above medical record    Marquis Lunch, PA-C, 09/25/2020

## 2020-09-26 ENCOUNTER — Ambulatory Visit (HOSPITAL_BASED_OUTPATIENT_CLINIC_OR_DEPARTMENT_OTHER): Payer: Self-pay

## 2020-09-30 ENCOUNTER — Telehealth (HOSPITAL_BASED_OUTPATIENT_CLINIC_OR_DEPARTMENT_OTHER): Payer: Self-pay

## 2020-09-30 NOTE — Telephone Encounter (Signed)
-----   Message from Judy Pimple, RN sent at 09/30/2020  9:22 AM EST -----  Regarding: RE: Procedure  Stephanie [name]    Please schedule e/o facial cyst with Ko for this Friday at 11:00AM    Thanks    Legrand Como  ----- Message -----  From: Teena Irani  Sent: 09/30/2020   9:08 AM EST  To: Sss Rn/[place] Pool  Subject: Procedure                                        Hi team,      Dr Letta Moynahan would like to get patient in for a procedure here. Could you please advise.      Thanks,  [employer]

## 2020-10-03 ENCOUNTER — Ambulatory Visit: Payer: No Typology Code available for payment source | Attending: Plastic Surgery | Admitting: Plastic Surgery

## 2020-10-03 ENCOUNTER — Encounter (HOSPITAL_BASED_OUTPATIENT_CLINIC_OR_DEPARTMENT_OTHER): Payer: Self-pay | Admitting: Plastic Surgery

## 2020-10-03 ENCOUNTER — Other Ambulatory Visit: Payer: Self-pay

## 2020-10-03 VITALS — BP 134/80 | HR 84 | Temp 98.2°F

## 2020-10-03 DIAGNOSIS — L720 Epidermal cyst: Secondary | ICD-10-CM | POA: Diagnosis present

## 2020-10-03 NOTE — Patient Instructions (Signed)
Patient Education      Index Spanish   Excision of a Skin Lesion     ________________________________________________________________________  KEY POINTS  · An excision is a procedure to remove a growth (lesion) from your skin.  · You may have this procedure to remove a mole or other growth for cosmetic reasons or to check to see if it is cancer.  · Ask your provider how long it will take to recover and how to take care of yourself at home.  · Make sure you know what symptoms or problems you should watch for and what to do if you have them.  ________________________________________________________________________  What is excision of a skin lesion?  An excision is a procedure to remove a growth (lesion) from your skin.  When is it used?  You may have this procedure to remove a mole or other growth for cosmetic reasons or to check to see if it is cancer. You may also have a lesion removed if it rubs against your clothing or is uncomfortable.  How do I prepare for this procedure?  · You may or may not need to take your regular medicines the day of the procedure. Tell your healthcare provider about all medicines and supplements that you take. Some products may increase your risk of side effects. Ask your healthcare provider if you need to avoid taking any medicine or supplements before the procedure.  · Tell your healthcare provider if you have any food, medicine, or other allergies such as latex.  · Follow any other instructions your healthcare provider gives you.  · Ask any questions you have before the procedure. You should understand what your healthcare provider is going to do. You have the right to make decisions about your healthcare and to give permission for tests or procedures.  What happens during the procedure?  You will be given a local anesthetic to numb the area. Your provider will cut a patch of skin around the lesion. He or she will remove the patch of skin with the lesion and then close the wound with  stitches, staples, skin glue, or surgical tape.  The lesion will usually be sent to the lab to be checked for cancer.  What happens after the procedure?  You may stay at the hospital or healthcare provider's office for a short time. The area where the lesion was removed may be sore for a couple of days. When it heals, there will be a scar.  It is important to follow the instructions your provider gives you for caring for the wound after the surgery. This can prevent infection and help create the smallest, least visible scar. Ask your provider:  · How and when you will get your test results  · How long it will take to recover  · If there are activities you should avoid and when you can return to normal activities  · How to take care of yourself at home  · What symptoms or problems you should watch for and what to do if you have them  Make sure you know when you should come back for a checkup. Keep all appointments for provider visits or tests.  What are the risks of this procedure?  Every procedure or treatment has risks. Some possible risks of this procedure include:  · You may have problems with anesthesia.  · You may have an infection or bleeding.  · The lesion may grow back.  · A lumpy scar called a keloid may   form where the lesion was removed.  Ask your healthcare provider how these risks apply to you. Be sure to discuss any other questions or concerns that you may have.  Developed by [employer].  Adult Advisor 2019.4 published by [employer].  Last modified: 2018-04-14  Last reviewed: 2016-05-24  This content is reviewed periodically and is subject to change as new health information becomes available. The information is intended to inform and educate and is not a replacement for medical evaluation, advice, diagnosis or treatment by a healthcare professional.  References   Adult Advisor 2019.4 Index    © 2018 [employer] and/or one of its subsidiaries

## 2020-10-03 NOTE — Progress Notes (Signed)
Patient tolerated excision of left temple lesion procedure well. VSS. No c/o pain. No evidence of bleeding. Site-care instructions reviewed with Pt. who has good understanding.

## 2020-10-03 NOTE — Progress Notes (Signed)
Plastic Surgery Procedure:    Pre Op Diagnosis: Mass left Temple    Post Op Diagnosis: Sebaceous Cyst    Procedure : Excisional Biopsy of mass Left temple    Surgeon Jessah Danser  Anesthesia 1% lido with epi  1.53ml    After consent the area was prepped and draped. Local was instilled.    I made an incision over the lesion with a #15 scalpel. The mass was removed. It was consistent with a seb cyst. Incision was 1.5cm long.    The wound was irrigated and cauterized with a battery operated bovie.    Closed with 6-0 prolene suture.    Steri Strips applied as dressing.    Tolerated well. Discharged home with instructions.    Follow Up one week.

## 2020-10-10 ENCOUNTER — Encounter (HOSPITAL_BASED_OUTPATIENT_CLINIC_OR_DEPARTMENT_OTHER): Payer: Self-pay | Admitting: Plastic Surgery

## 2020-10-10 ENCOUNTER — Ambulatory Visit: Payer: No Typology Code available for payment source | Attending: Family Medicine | Admitting: Plastic Surgery

## 2020-10-10 ENCOUNTER — Other Ambulatory Visit: Payer: Self-pay

## 2020-10-10 DIAGNOSIS — L720 Epidermal cyst: Secondary | ICD-10-CM | POA: Diagnosis not present

## 2020-10-10 NOTE — Progress Notes (Signed)
Plastic Surgery    Pt is one week S/P excision of lesion of her left temple area. She reports no problems.    Exam: Steri removed. Sutures intact. Incision healing.    Plan: Sutures removed.  Path pending.  Wound care explained to pt. Follow up prn. I will call her with results.

## 2020-10-11 LAB — SURGICAL PATH SPECIMEN SOFT TISSUE

## 2020-10-15 MED FILL — TRAZODONE  50MG: 30 days supply | Qty: 30 | Fill #10

## 2020-10-15 MED FILL — DULOXETINE 60MG: 30 days supply | Qty: 30 | Fill #3

## 2020-10-15 MED FILL — HYDROCHLOROTHIAZIDE 25MG: 30 days supply | Qty: 30 | Fill #10

## 2020-10-19 MED FILL — LOSARTAN POT 25MG: 30 days supply | Qty: 30 | Fill #1

## 2020-10-22 ENCOUNTER — Telehealth (HOSPITAL_BASED_OUTPATIENT_CLINIC_OR_DEPARTMENT_OTHER): Payer: Self-pay

## 2020-10-22 ENCOUNTER — Ambulatory Visit: Payer: No Typology Code available for payment source | Admitting: Family Medicine

## 2020-10-22 NOTE — Telephone Encounter (Signed)
LVM w/ interperter Claudia to call back [facility] to reschedule.    pls reschedule televisit  Received: Today  [name], PA-C  [facility]  Hi, pls reschedule televisit w me. Epic was down and unable to do the visit today.     -R

## 2020-10-28 ENCOUNTER — Encounter (HOSPITAL_BASED_OUTPATIENT_CLINIC_OR_DEPARTMENT_OTHER): Payer: Self-pay

## 2020-10-28 ENCOUNTER — Emergency Department
Admission: AD | Admit: 2020-10-28 | Discharge: 2020-10-28 | Disposition: A | Discharge status: Home / Self Care | Payer: No Typology Code available for payment source | Source: Ambulatory Visit | Attending: Emergency Medicine | Admitting: Emergency Medicine

## 2020-10-28 DIAGNOSIS — M542 Cervicalgia: Secondary | ICD-10-CM | POA: Insufficient documentation

## 2020-10-28 MED ORDER — DIAZEPAM 5 MG PO TABS
5.00 mg | ORAL_TABLET | Freq: Once | ORAL | Status: AC
Start: 2020-10-28 — End: 2020-10-28
  Administered 2020-10-28: 5 mg via ORAL
  Filled 2020-10-28: qty 1

## 2020-10-28 MED ORDER — LIDOCAINE 4 % EX PTCH
1.0000 | MEDICATED_PATCH | Freq: Once | CUTANEOUS | Status: DC
Start: 2020-10-28 — End: 2020-10-28
  Administered 2020-10-28: 1 via TRANSDERMAL
  Filled 2020-10-28: qty 1

## 2020-10-28 MED ORDER — ACETAMINOPHEN 500 MG PO TABS
1000.0000 mg | ORAL_TABLET | Freq: Once | ORAL | Status: AC
Start: 2020-10-28 — End: 2020-10-28
  Administered 2020-10-28: 1000 mg via ORAL
  Filled 2020-10-28: qty 2

## 2020-10-28 MED ORDER — METHOCARBAMOL 500 MG PO TABS
500.00 mg | ORAL_TABLET | Freq: Every evening | ORAL | 0 refills | Status: AC | PRN
Start: 2020-10-28 — End: 2020-11-02

## 2020-10-28 MED ORDER — LIDOCAINE 5 % EX PTCH
1.00 | MEDICATED_PATCH | CUTANEOUS | 0 refills | Status: AC
Start: 2020-10-28 — End: 2020-11-04

## 2020-10-28 NOTE — Discharge Instructions (Addendum)
DIAGNOSIS & TREATMENT:  You were seen in a [facility] Emergency Department for neck pain mostly likely caused by muscle strain or spasms. You were treated with tylenol and valium in the ER.      FURTHER CARE:  Please apply hot water bottle or heating pad to the affected area for 20 minutes at a time to help relax the muscles. Use ibuprofen for baseline pain control and use Robaxin as needed for additional relief. After you begin to feel better, perform the exercises on the attached sheet every day to help strengthen your muscles and reduce your episodes of back pain.    You were given a prescription for Robaxin , a muscle relaxant.    This medication helps calm the muscle down reducing the spasms and pain.   These medications can make you drowsy and therefore you should not drive a car or operate heavy machinery while taking.      WHEN SHOULD YOU BE SEEN NEXT?  Please call your doctor in 5-10 days is symptoms are not improving.  If you do not have a primary care doctor, please call [phone] to set one up with [facility].     WHEN SHOULD YOU RETURN TO THE ED?  Please return to the emergency room if experience uncontrolled vomiting that prevents you from tolerating fluids, you develop fever, shooting pain down both legs, you can not control your bowels or bladder, you feel you have to urinate but are unable to produce a stream, your legs become numb or weak, your symptoms worsen, you get new symptoms or you are unable to schedule follow up care.

## 2020-10-28 NOTE — Narrator Note (Signed)
Patient is resting comfortably. 

## 2020-10-28 NOTE — Narrator Note (Signed)
Patient Disposition  Patient education for diagnosis, medications, activity, diet and follow-up.  Patient left UC 4:03 PM.  Patient rep received written instructions.    Interpreter to provide instructions: Yes; Interpreter ID: [phone]    Patient belongings with patient: YES    Have all existing LDAs been addressed? N/A    Have all IV infusions been stopped? N/A    Destination: Home

## 2020-10-28 NOTE — UC Provider Notes (Signed)
I have reviewed the UC nursing notes and prior records. I have reviewed the patient's past medical history/problem list, allergies, social history and medication list.  I saw this patient primarily.      Care language: [place] ([place]); Interpreter used.         HPI:  This 60 year old female patient p/w atraumatic neck pain x 4 days. States she woke up feeling pain in the back of the neck radiating to b/l shoulders a few days ago, thought it was her pillow so bought a new one but that's not helping. Her pain is worse with movement. She denies any trauma, injury. 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 She denies any numbness, tingling, weakness In extremities. Denies associated HA, dizziness, sore throat, rash n/v/d, vision change, hearing changes, cp, sob, abd pain. Denies any fever or URI sx. Denies saddle anesthesia, incontinence or urinary retention. [place] history of IV drug use. She denies anticoagulants. Denies history of immunosuppression. She has been taking Aleve without relief, last dose 3 hours ago        ROS: Pertinent positives were reviewed as per the HPI above.    Past Medical History/Problem List:  Past Medical History:  02/12/2016: Chronic right shoulder pain  No date: Depression  03/20/2012: Diverticulosis of colon      Comment:  Noted on colonoscopy 6/13   08/16/2010: Elevated hemoglobin A1c      Comment:  HEMOGLOBIN A1C (%) Date Value 03/06/2015 5.6                05/20/2014 5.4 05/08/2012 6.0 (H)   No results found for:               POCA1C   11/21/2012: Hyperopia with astigmatism and presbyopia  No date: Hypertension  11/21/2012: Immature cataract  01/21/2012: Impingement syndrome of right shoulder      Comment:  10/14: pt taking  [place] med (meloxicam 7.5 mg BID                and cyclobenzaprine); Will ask for refill once supply                finished; PCP ok with refill of flexeril on temporary                basis;   01/26/2008: Irritable bladder       Comment:  Seen by Dr Jeraldine Loots 5/09, no incontinence, likely                irritable bladder.  Trial of anticholinergic, refer to                urology, has appt Dr Minna Antis 02/23/08  02/16/2005: Leiomyoma of uterus, unspecified      Comment:  Dr Jeraldine Loots hysterectomy November, 2006; cervix retained,               last pap 11/11 neg Repeat pap/HPV 07/2015   02/15/2014: Mass of hand  07/30/2011: Other plastic surgery for unacceptable cosmetic appearance      Comment:  Breast augmentation scheduled 08/02/11;  Dr.  Ancil Boozer Phone#: [phone]     01/08/2016: Schistosomiasis  08/13/2016: Squamous 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   eyes  06/18/2004: Urinary calculus, unspecified  Patient Active Problem List:     Class 1 obesity due to excess calories without serious comorbidity  with body mass index (BMI) of 32.0 to 32.9 in adult     Pure hypercholesterolemia     Family history of malignant neoplasm of gastrointestinal tract     Tubular adenoma of colon     Hyperopia with astigmatism and presbyopia     Immature cataract     Hypertension, goal below 140/90     Adenomatous polyp of colon     Squamous 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 eyes     Alcohol abuse     Depression, recurrent (HCC)     Trauma and stressor-related disorder     Nontraumatic complete tear of right rotator cuff     Localized osteoarthritis of left knee     Obesity (BMI 30-39.9)      Past Surgical History:  Past Surgical History:  No date: BREAST ENHANCEMENT SURGERY      Comment:  12/12 had bilateral mammoplasty and saline implants, had               2nd surgery 5/30 for revision  No date: MAMMAPLASTY AUGMENTATION W/PROSTHETIC IMPLANT  No date: MASTOPEXY  No date: OB ANTEPARTUM CARE CESAREAN DLVR & POSTPARTUM      Comment:  x3  No date: REDUCTION MAMMAPLASTY  No date: TOTAL ABDOMINAL HYSTERECT W/WO RMVL TUBE OVARY      Comment:  for fibroids, still has cervix    Medications:   No current facility-administered  medications for this encounter.     Current Outpatient Medications   Medication Sig    losartan (COZAAR) 25 MG tablet Take 1 tablet by mouth daily    meloxicam (MOBIC) 15 MG tablet Take 1 tablet by mouth daily    DULoxetine (CYMBALTA) 60 MG capsule Take 1 capsule by mouth daily Stop taking duloxetine 30mg     polyethylene glycol (GLYCOLAX) 17 GM/SCOOP powder Take 238 g by mouth See Admin Instructions Take as directed prior to colonoscopy    bisacodyl (DULCOLAX) 5 MG EC tablet Take 4 tablets by mouth See Admin Instructions Take as directed prior to colonoscopy    Simethicone 80 MG TABS Take 4 tablets by mouth See Admin Instructions Take as directed prior to colonoscopy.    hydrochlorothiazide (HYDRODIURIL) 25 MG tablet TAKE 1 TABLET BY MOUTH DAILY    omeprazole (PRILOSEC) 40 MG capsule Take 1 capsule by mouth daily    traZODone (DESYREL) 50 MG tablet Take 1 tablet by mouth nightly       Social History:  Social History    Tobacco Use      Smoking status: Former Smoker        Types: Cigarettes        Quit date: 07/27/2001        Years since quitting: 19.2      Smokeless tobacco: Never Used    Alcohol use: Yes      Alcohol/week: 6.0 standard drinks      Types: 6 Cans of beer per week      Comment: 6 beers per week       Family History:   family history includes Cancer - Other in her father; Diabetes in her mother; Heart in her brother, brother, and mother.    Allergies:  Review of Patient's Allergies indicates:  No Known Allergies    Physical Exam:  ED Triage Vitals [10/28/20 1508]   ED Triage Vitals Brief Group      Temp 97.8 F      Pulse 89  Resp 22      BP 119/73      SpO2 97 %      Pain Score      GENERAL: No acute distress.   SKIN:  Warm & Dry, no rash.  HEAD: Atraumatic. PERRL. EOMI.  Oropharynx: clear. MMM  NECK: No midline tenderness. Tenderness and tightness bilateral cervical paraspinal muscles and trapezius. Pain elicited with lateral neck flexion.   LUNGS:  Clear to auscultation bilaterally. No  wheezes, rales, rhonchi.   HEART:  RRR.  No murmurs, rubs, or gallops.   ABDOMEN:  Soft, NTND.  No guarding or rebound tenderness.   MUSCULOSKELETAL:  No obvious deformities. 2+ rad pulses. 5/5 strength x4. Sensation intact and equal x4  NEUROLOGIC: Alert and oriented.  Moves all extremities well. No cranial nerve deficits no cerebellar deficits  PSYCHIATRIC:  Appropriate for age, time of day, and situation        UC Course and Medical Decision-making:    The patient is 60 year old female with concern for neck pain x4 days. Patient states that she has been taking Aleve and Tylenol with no relief, pain is worse with movement. No red flags concerning for cord compression, epidural abscess, meningitis, vascular problem, subarachnoid hemorrhage. No associated chest pain or concern for cardiac cause.    Patient well-appearing with normal vital signs. Her pain is reproducible on palpation of the trapezius and cervical MSK with spasming. She has no midline tenderness and no motor or sensory deficits. I do not think emergent MRI is necessary or emergent lab work-up.    Suspect MSK strain. Patient received hot pack in the ED, dose of Tylenol, Lidoderm patch and a dose of Valium. I advised that she continue alternating with the Aleve and Tylenol and gave her prescription for Lidoderm patches and Robaxin. She was advised to continue heat and ice as needed and follow-up with her PCP in 3 days    Additional verbal discharge instructions were provided including patients diagnosis and follow up plan, as well as reasons to return to Urgent Care which were discussed in detail.  Patient is agreeable with this management.     Condition: Stable    Disposition:  Discharge    Diagnosis/Diagnoses:  Neck pain        Fabiola Backer PA-C

## 2020-10-28 NOTE — UC Triage Notes (Signed)
C/o neck and upper back pain x 5 days. Denies injury. Taking Alleve and Tylenol. LD Alleve 3 hours ago

## 2020-10-30 MED FILL — LOSARTAN POT 25MG: 30 days supply | Qty: 30 | Fill #1

## 2020-10-30 MED FILL — LIDODERM   DIS 5%: 60 days supply | Qty: 60 | Fill #1

## 2020-10-31 ENCOUNTER — Other Ambulatory Visit (HOSPITAL_BASED_OUTPATIENT_CLINIC_OR_DEPARTMENT_OTHER): Payer: Self-pay | Admitting: Family Medicine

## 2020-10-31 MED FILL — METHOCARBAM 500MG: 5 days supply | Qty: 5 | Fill #0 | Status: CP

## 2020-10-31 NOTE — Telephone Encounter (Signed)
[name] is a 60 year old female has requested a refill of      -  Omeprazole 40 mg       Last Office Visit: 08/19/20 with Leane Call  Last Physical Exam: 02/21/13     COLONOSCOPY due on 02/11/2020     Other Med Adult:  Most Recent BP Reading(s)  10/28/20 : 119/73        Cholesterol (mg/dL)   Date Value   05/14/2020 238     LOW DENSITY LIPOPROTEIN DIRECT (mg/dL)   Date Value   05/14/2020 127     HIGH DENSITY LIPOPROTEIN (mg/dL)   Date Value   05/14/2020 73     TRIGLYCERIDES (mg/dL)   Date Value   05/14/2020 204 (H)         THYROID SCREEN TSH REFLEX FT4 (uIU/mL)   Date Value   05/14/2020 0.840         TSH (THYROID STIM HORMONE) (uIU/mL)   Date Value   12/10/2016 0.790       HEMOGLOBIN A1C (%)   Date Value   04/17/2020 6.2 (H)       No results found for: POCA1C      INR (no units)   Date Value   04/22/2008 1.0 (L)   02/07/2005 1.0 (L)       SODIUM (mmol/L)   Date Value   05/14/2020 140       POTASSIUM (mmol/L)   Date Value   05/14/2020 3.5           CREATININE (mg/dL)   Date Value   05/14/2020 0.7        Documented patient preferred pharmacies:    [facility], [place] - [place]. [address]  Phone: [phone] Fax: [phone]

## 2020-11-01 MED FILL — OMEPRAZOLE 40MG: 30 days supply | Qty: 30 | Fill #0

## 2020-11-06 ENCOUNTER — Other Ambulatory Visit (HOSPITAL_BASED_OUTPATIENT_CLINIC_OR_DEPARTMENT_OTHER): Payer: Self-pay | Admitting: Family Medicine

## 2020-11-06 NOTE — Telephone Encounter (Signed)
Stephanie [name] [id number], 60 year old, female    Calls today:  Refill    !! Before starting refill request, check EPIC to see if encounter for this medication already exists !!    (May list multiple medications in this section)  Medicine Name:methocarbamol (ROBAXIN) 500 MG tablet  Dosage: 500MG   Frequency (how many pills, how many times a day):UNKNOWN   Number of pills left:    Documented patient preferred pharmacies:    [facility], [place]  Phone: [phone] Fax: [phone]    Person calling on behalf of patient: Patient (self)    CALL BACK NUMBER:[phone]  Best time to call back:  Cell phone:   Other phone:    Patient's language of care: [place] ([place]    Patient needs a [place] interpreter.    Patient's PCP: Lemar Livings, MD

## 2020-11-06 NOTE — Telephone Encounter (Signed)
[facility] Pharm[facility], Stephanie [name] is a 60 year old female has requested a refill of      -  methocarbamol        Last Office Visit: 2.11.22 with ko a   Last Physical Exam: 6.25.2014     COLONOSCOPY due on 02/11/2020     Other Med Adult:  Most Recent BP Reading(s)  10/28/20 : 119/73        Cholesterol (mg/dL)   Date Value   05/14/2020 238     LOW DENSITY LIPOPROTEIN DIRECT (mg/dL)   Date Value   05/14/2020 127     HIGH DENSITY LIPOPROTEIN (mg/dL)   Date Value   05/14/2020 73     TRIGLYCERIDES (mg/dL)   Date Value   05/14/2020 204 (H)         THYROID SCREEN TSH REFLEX FT4 (uIU/mL)   Date Value   05/14/2020 0.840         TSH (THYROID STIM HORMONE) (uIU/mL)   Date Value   12/10/2016 0.790       HEMOGLOBIN A1C (%)   Date Value   04/17/2020 6.2 (H)       No results found for: POCA1C      INR (no units)   Date Value   04/22/2008 1.0 (L)   02/07/2005 1.0 (L)       SODIUM (mmol/L)   Date Value   05/14/2020 140       POTASSIUM (mmol/L)   Date Value   05/14/2020 3.5           CREATININE (mg/dL)   Date Value   05/14/2020 0.7        Documented patient preferred pharmacies:    [facility], [place]. [address]  Phone: [phone] Fax: [phone]

## 2020-11-18 ENCOUNTER — Other Ambulatory Visit (HOSPITAL_BASED_OUTPATIENT_CLINIC_OR_DEPARTMENT_OTHER): Payer: Self-pay | Admitting: Family Medicine

## 2020-11-18 DIAGNOSIS — F5104 Psychophysiologic insomnia: Secondary | ICD-10-CM

## 2020-11-18 DIAGNOSIS — I10 Essential (primary) hypertension: Secondary | ICD-10-CM

## 2020-11-18 MED FILL — DULOXETINE 60MG: 30 days supply | Qty: 30 | Fill #4

## 2020-11-19 MED FILL — HYDROCHLOROTHIAZIDE 25MG: 90 days supply | Qty: 90 | Fill #0 | Status: CP

## 2020-11-19 MED FILL — TRAZODONE  50MG: 90 days supply | Qty: 90 | Fill #0 | Status: CP

## 2020-11-19 NOTE — Telephone Encounter (Signed)
[facility] Pharm[facility], Stephanie [name] is a 60 year old female has requested a refill of Trazodone 50 mg.      Last Office Visit: 09/25/20 with Virgel Paling  Last Physical Exam: 02/21/13    COLONOSCOPY due on 02/11/2020    Other Med Adult:  Most Recent BP Reading(s)  10/28/20 : 119/73        Cholesterol (mg/dL)   Date Value   05/14/2020 238     LOW DENSITY LIPOPROTEIN DIRECT (mg/dL)   Date Value   05/14/2020 127     HIGH DENSITY LIPOPROTEIN (mg/dL)   Date Value   05/14/2020 73     TRIGLYCERIDES (mg/dL)   Date Value   05/14/2020 204 (H)         THYROID SCREEN TSH REFLEX FT4 (uIU/mL)   Date Value   05/14/2020 0.840         TSH (THYROID STIM HORMONE) (uIU/mL)   Date Value   12/10/2016 0.790       HEMOGLOBIN A1C (%)   Date Value   04/17/2020 6.2 (H)       No results found for: POCA1C      INR (no units)   Date Value   04/22/2008 1.0 (L)   02/07/2005 1.0 (L)       SODIUM (mmol/L)   Date Value   05/14/2020 140       POTASSIUM (mmol/L)   Date Value   05/14/2020 3.5           CREATININE (mg/dL)   Date Value   05/14/2020 0.7       Documented patient preferred pharmacies:    [facility], [place] - [place]. [address]  Phone: [phone] Fax: [phone]

## 2020-11-19 NOTE — Telephone Encounter (Signed)
PER Pharmacy, Stephanie [name] is a 60 year old female has requested a refill of HCTZ .      Last Office Visit: 09/25/20 with Virgel Paling  Last Physical Exam: 02/21/13    COLONOSCOPY due on 02/11/2020    Other Med Adult:  Most Recent BP Reading(s)  10/28/20 : 119/73        Cholesterol (mg/dL)   Date Value   05/14/2020 238     LOW DENSITY LIPOPROTEIN DIRECT (mg/dL)   Date Value   05/14/2020 127     HIGH DENSITY LIPOPROTEIN (mg/dL)   Date Value   05/14/2020 73     TRIGLYCERIDES (mg/dL)   Date Value   05/14/2020 204 (H)         THYROID SCREEN TSH REFLEX FT4 (uIU/mL)   Date Value   05/14/2020 0.840         TSH (THYROID STIM HORMONE) (uIU/mL)   Date Value   12/10/2016 0.790       HEMOGLOBIN A1C (%)   Date Value   04/17/2020 6.2 (H)       No results found for: POCA1C      INR (no units)   Date Value   04/22/2008 1.0 (L)   02/07/2005 1.0 (L)       SODIUM (mmol/L)   Date Value   05/14/2020 140       POTASSIUM (mmol/L)   Date Value   05/14/2020 3.5           CREATININE (mg/dL)   Date Value   05/14/2020 0.7       Documented patient preferred pharmacies:    [facility], [place] - [place]. [address]  Phone: [phone] Fax: [phone]

## 2020-12-19 ENCOUNTER — Other Ambulatory Visit (HOSPITAL_BASED_OUTPATIENT_CLINIC_OR_DEPARTMENT_OTHER): Payer: Self-pay | Admitting: Family Medicine

## 2020-12-19 MED FILL — *LOSARTAN POT 25MG: 30 days supply | Qty: 30 | Fill #2

## 2020-12-19 MED FILL — OMEPRAZOLE 40MG: 30 days supply | Qty: 30 | Fill #1

## 2020-12-20 NOTE — Telephone Encounter (Signed)
PER Pharmacy, Stephanie [name] is a 60 year old female has requested a refill of                -  cymbalta 60mg .      Last Office Visit: 09/25/20 with Lucile Crater  Last Physical Exam: 02/21/2013    COLONOSCOPY due on 02/11/2020    Other Med Adult:  Most Recent BP Reading(s)  10/28/20 : 119/73        Cholesterol (mg/dL)   Date Value   05/14/2020 238     LOW DENSITY LIPOPROTEIN DIRECT (mg/dL)   Date Value   05/14/2020 127     HIGH DENSITY LIPOPROTEIN (mg/dL)   Date Value   05/14/2020 73     TRIGLYCERIDES (mg/dL)   Date Value   05/14/2020 204 (H)         THYROID SCREEN TSH REFLEX FT4 (uIU/mL)   Date Value   05/14/2020 0.840         TSH (THYROID STIM HORMONE) (uIU/mL)   Date Value   12/10/2016 0.790       HEMOGLOBIN A1C (%)   Date Value   04/17/2020 6.2 (H)       No results found for: POCA1C      INR (no units)   Date Value   04/22/2008 1.0 (L)   02/07/2005 1.0 (L)       SODIUM (mmol/L)   Date Value   05/14/2020 140       POTASSIUM (mmol/L)   Date Value   05/14/2020 3.5           CREATININE (mg/dL)   Date Value   05/14/2020 0.7       Documented patient preferred pharmacies:    [facility], [place] - [place]. [address]  Phone: [phone] Fax: [phone]

## 2020-12-22 MED FILL — DULOXETINE 60MG: 30 days supply | Qty: 30 | Fill #0

## 2020-12-30 ENCOUNTER — Ambulatory Visit (HOSPITAL_BASED_OUTPATIENT_CLINIC_OR_DEPARTMENT_OTHER): Payer: Self-pay | Admitting: Family Medicine

## 2021-01-28 MED FILL — DULOXETINE 60MG: 30 days supply | Qty: 30 | Fill #1

## 2021-01-28 MED FILL — *LOSARTAN POT 25MG: 30 days supply | Qty: 30 | Fill #3

## 2021-01-28 MED FILL — OMEPRAZOLE 40MG: 30 days supply | Qty: 30 | Fill #2

## 2021-01-30 MED FILL — HYDROCHLOROTHIAZIDE 25MG: 90 days supply | Qty: 90 | Fill #1 | Status: CP

## 2021-01-30 MED FILL — TRAZODONE  50MG: 90 days supply | Qty: 90 | Fill #1 | Status: CP

## 2021-02-02 ENCOUNTER — Telehealth: Payer: Self-pay

## 2021-02-02 NOTE — Telephone Encounter (Signed)
-----   Message from Rosaura Carpenter, LPN sent at 04/06/6772  4:38 PM EDT -----  Regarding: PCV20  Prior Auth needed for PCV20  Thanks.

## 2021-02-02 NOTE — Telephone Encounter (Signed)
pcv20    The vaccine is covered under the patient's [employer] medical coverage.    Please choose Private

## 2021-02-03 ENCOUNTER — Ambulatory Visit: Payer: No Typology Code available for payment source | Attending: Family Medicine | Admitting: Family Medicine

## 2021-02-03 ENCOUNTER — Encounter (HOSPITAL_BASED_OUTPATIENT_CLINIC_OR_DEPARTMENT_OTHER): Payer: Self-pay | Admitting: Family Medicine

## 2021-02-03 ENCOUNTER — Other Ambulatory Visit: Payer: Self-pay

## 2021-02-03 VITALS — BP 117/69 | HR 90 | Temp 97.4°F | Ht 59.57 in | Wt 168.8 lb

## 2021-02-03 DIAGNOSIS — L720 Epidermal cyst: Secondary | ICD-10-CM | POA: Insufficient documentation

## 2021-02-03 DIAGNOSIS — M79674 Pain in right toe(s): Secondary | ICD-10-CM | POA: Diagnosis present

## 2021-02-03 DIAGNOSIS — Z6833 Body mass index (BMI) 33.0-33.9, adult: Secondary | ICD-10-CM | POA: Diagnosis present

## 2021-02-03 DIAGNOSIS — G8929 Other chronic pain: Secondary | ICD-10-CM | POA: Diagnosis present

## 2021-02-03 DIAGNOSIS — M25562 Pain in left knee: Secondary | ICD-10-CM | POA: Diagnosis present

## 2021-02-03 MED ORDER — PHENTERMINE HCL 15 MG PO CAPS
15.0000 mg | ORAL_CAPSULE | Freq: Every morning | ORAL | 1 refills | Status: DC
Start: 2021-02-03 — End: 2021-08-03

## 2021-02-03 MED FILL — PHENTERMINE 15MG: 30 days supply | Qty: 30 | Fill #0
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
## 2021-02-06 ENCOUNTER — Encounter (HOSPITAL_BASED_OUTPATIENT_CLINIC_OR_DEPARTMENT_OTHER): Payer: Self-pay | Admitting: Family Medicine

## 2021-02-26 ENCOUNTER — Other Ambulatory Visit (HOSPITAL_BASED_OUTPATIENT_CLINIC_OR_DEPARTMENT_OTHER): Payer: Self-pay | Admitting: Registered Nurse

## 2021-02-26 DIAGNOSIS — G8929 Other chronic pain: Secondary | ICD-10-CM

## 2021-03-03 ENCOUNTER — Ambulatory Visit
Admission: RE | Admit: 2021-03-03 | Discharge: 2021-03-03 | Disposition: A | Discharge status: Home / Self Care | Payer: No Typology Code available for payment source | Attending: Orthopaedic Surgery | Admitting: Orthopaedic Surgery

## 2021-03-03 ENCOUNTER — Ambulatory Visit (HOSPITAL_BASED_OUTPATIENT_CLINIC_OR_DEPARTMENT_OTHER)
Admission: RE | Admit: 2021-03-03 | Discharge: 2021-03-03 | Disposition: A | Discharge status: Home / Self Care | Payer: No Typology Code available for payment source | Source: Ambulatory Visit

## 2021-03-03 ENCOUNTER — Ambulatory Visit (HOSPITAL_BASED_OUTPATIENT_CLINIC_OR_DEPARTMENT_OTHER): Payer: No Typology Code available for payment source | Admitting: Orthopaedic Surgery

## 2021-03-03 ENCOUNTER — Other Ambulatory Visit: Payer: Self-pay

## 2021-03-03 DIAGNOSIS — M25862 Other specified joint disorders, left knee: Secondary | ICD-10-CM | POA: Insufficient documentation

## 2021-03-03 DIAGNOSIS — M1712 Unilateral primary osteoarthritis, left knee: Secondary | ICD-10-CM | POA: Insufficient documentation

## 2021-03-03 DIAGNOSIS — G8929 Other chronic pain: Secondary | ICD-10-CM

## 2021-03-03 DIAGNOSIS — M25861 Other specified joint disorders, right knee: Secondary | ICD-10-CM | POA: Diagnosis present

## 2021-03-03 NOTE — Progress Notes (Signed)
Orthopedic Office Note    CC: Patient presents with:  Knee Pain: Left knee pain    ORTHOPEDIC PROBLEM LIST:  1. Left knee osteoarthritis     HPI: Stephanie [name] is a 60 year old female who presents today for evaluation of left knee pain. States her pain began years ago. There was no precipitating injury or trauma. Localizes the pain over the medial aspect of the knee. She was seen by two doctors for this previously, one being Dr. Jalene Mullet. Got a cortisone injection from [facility] in 2020. States she also had an "ozone injection" and what sounds like a PRP injection. Both offered transient relief. The pain is now present all the time. She localizes the pain mostly medially over the knee. No locking or buckling. Feels crunching in the knee. She works as a Electrical engineer. Denies any numbness or tingling distally. She has been taking a medication her daughter brought her from [place] which is helpful.     [place] speaking telephone interpreter was used for this visit.     ROS: Review of systems filled out by patient and scanned into the medical record. Reviewed by me and all other systems are negative except as noted in HPI.    PMH: Past Medical History:  02/12/2016: Chronic right shoulder pain  No date: Depression  03/20/2012: Diverticulosis of colon      Comment:  Noted on colonoscopy 6/13   08/16/2010: Elevated hemoglobin A1c      Comment:  HEMOGLOBIN A1C (%) Date Value 03/06/2015 5.6                05/20/2014 5.4 05/08/2012 6.0 (H)   No results found for:               POCA1C   11/21/2012: Hyperopia with astigmatism and presbyopia  No date: Hypertension  11/21/2012: Immature cataract  01/21/2012: Impingement syndrome of right shoulder      Comment:  10/14: pt taking  [place] med (meloxicam 7.5 mg BID                and cyclobenzaprine); Will ask for refill once supply                finished; PCP ok with refill of flexeril on temporary                basis;   01/26/2008: Irritable bladder      Comment:  Seen by Dr Jeraldine Loots  5/09, no incontinence, likely                irritable bladder.  Trial of anticholinergic, refer to                urology, has appt Dr Minna Antis 02/23/08  02/16/2005: Leiomyoma of uterus, unspecified      Comment:  Dr Jeraldine Loots hysterectomy November, 2006; cervix retained,               last pap 11/11 neg Repeat pap/HPV 07/2015   02/15/2014: Mass of hand  07/30/2011: Other plastic surgery for unacceptable cosmetic appearance      Comment:  Breast augmentation scheduled 08/02/11;  Dr.  Ancil Boozer Phone#: [phone]     01/08/2016: Schistosomiasis  08/13/2016: Squamous 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   eyes  06/18/2004: Urinary calculus, unspecified    FH:  Review of patient's family history indicates:  Problem: Cancer -  Other      Relation: Father          Age of Onset: (Not Specified)          Comment: stomach  Problem: Heart      Relation: Brother          Age of Onset: (Not Specified)          Comment: died at 89 during valve replacement  Problem: Heart      Relation: Brother          Age of Onset: (Not Specified)          Comment: chestpain with neg w/u  Problem: Heart      Relation: Mother          Age of Onset: (Not Specified)          Comment: Died of MI age 89  Problem: Diabetes      Relation: Mother          Age of Onset: (Not Specified)    Surgical HX: Past Surgical History:  No date: BREAST ENHANCEMENT SURGERY      Comment:  12/12 had bilateral mammoplasty and saline implants, had               2nd surgery 5/30 for revision  No date: MAMMAPLASTY AUGMENTATION W/PROSTHETIC IMPLANT  No date: MASTOPEXY  No date: OB ANTEPARTUM CARE CESAREAN DLVR & POSTPARTUM      Comment:  x3  No date: REDUCTION MAMMAPLASTY  No date: TOTAL ABDOMINAL HYSTERECT W/WO RMVL TUBE OVARY      Comment:  for fibroids, still has cervix    SH:   Social History     Socioeconomic History    Marital status: Divorced     Spouse name: Not on file    Number of children: Not on file    Years of education: Not on file     Highest education level: Not on file   Occupational History    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; 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   Tobacco Use    Smoking status: Former Smoker     Types: Cigarettes     Quit date: 07/27/2001     Years since quitting: 19.6    Smokeless tobacco: Never Used   Substance and Sexual Activity    Alcohol use: Yes     Alcohol/week: 6.0 standard drinks     Types: 6 Cans of beer per week     Comment: 6 beers per week     Drug use: No    Sexual activity: Not Currently     Partners: Male     Comment: G3P3, no hx STD, no hx abn Pap   Other Topics Concern    Not on file   Social History Narrative    08/03/19 Divorced, has 3 children, lives alone, works as a Education administrator lady    Social Determinants of Librarian, academic Strain: Not on file  Food Insecurity: Not on file  Transportation Needs: Not on file  Physical Activity: Not on file  Stress: Not on file  Social Connections: Not on file  Intimate Partner Violence: Not on file  Housing Stability: Not on file    Allergies: Review of Patient's Allergies indicates:  No Known Allergies    Current Medications:   Current Outpatient Medications:     phentermine 15 MG capsule, Take 1 capsule by mouth every morning, Disp: 30 capsule, Rfl: 1    DULoxetine (  CYMBALTA) 60 MG capsule, Take 1 capsule by mouth daily Stop taking duloxetine 30mg , Disp: 30 capsule, Rfl: 5    traZODone (DESYREL) 50 MG tablet, Take 1 tablet by mouth nightly, Disp: 90 tablet, Rfl: 3    hydrochlorothiazide (HYDRODIURIL) 25 MG tablet, Take 1 tablet by mouth daily, Disp: 90 tablet, Rfl: 3    omeprazole (PRILOSEC) 40 MG capsule, TAKE 1 CAPSULE BY MOUTH DAILY, Disp: 30 capsule, Rfl: 11    losartan (COZAAR) 25 MG tablet, Take 1 tablet by mouth daily, Disp: 30 tablet, Rfl: 5    meloxicam (MOBIC) 15 MG tablet, Take 1 tablet by mouth daily, Disp: 30 tablet, Rfl: 2    polyethylene glycol (GLYCOLAX) 17 GM/SCOOP powder, Take 238 g by mouth See Admin Instructions Take as directed  prior to colonoscopy, Disp: 238 g, Rfl: 0    bisacodyl (DULCOLAX) 5 MG EC tablet, Take 4 tablets by mouth See Admin Instructions Take as directed prior to colonoscopy, Disp: 4 tablet, Rfl: 0    Simethicone 80 MG TABS, Take 4 tablets by mouth See Admin Instructions Take as directed prior to colonoscopy., Disp: 4 tablet, Rfl: 0    IMAGING: x-ray of the left knee today shows degenerative changes with severe medial compartment narrowing. Imaging was reviewed by myself and Dr. Williams Che during this visit.     PHYSICAL EXAM:  GENERAL: Alert and oriented, in no acute distress  MOOD: Appropriate.   MUSCULOSKELETAL: Examination of the left knee shows slight varus deformity. Overlying skin is warm, dry, and intact. There is no erythema, edema, or ecchymosis. There is medial joint line tenderness, no lateral joint line tenderness. There is crepitus over the patella with active flexion and extension of the knee. Active ROM 8-120 degrees. Ligaments are stable to valgus and varus stressing. Calf is soft and nontender. Neurovascularly intact distally.     ASSESSMENT/PLAN: 60 year old female who presents today for evaluation of left knee pain. She has severe osteoarthritis on x-rays taken today. Treatment options were discussed. She has tried several different types of injections in the knee in the past, all with minimal or no relief. She is ready to discuss TKA surgery. Wants to have this done in September. We briefly reviewed the surgery and recovery period. She was provided with a joint folder today. We will have her see Dr. Madelaine Etienne for TKA consultation.     We reviewed the likely diagnosis, the prognosis, and various treatment options in detail. Risks and benefits of treatment plan discussed. The patient's questions have been answered, and the patient understands agrees with treatment plan.     Dr. Williams Che saw and examined the patient and formulated the assessment and plan.    Stephanie Paradise, PA-C, 03/03/2021    Nursing  Communication:  None

## 2021-03-03 NOTE — Progress Notes (Signed)
(  M17.12) Primary osteoarthritis of left knee  (primary encounter diagnosis)  Comment: Discussed options  Plan: INT referral for TKR    I, Sarina Ill MD, performed the substantive portion of this split/shared visit.  Total time on date of encounter: 30 mins  I reviewed the medical record   I interviewed the patient  I examined the patient   I used an interpreter  I reviewed available data  I discussed w PA  I performed the Medical Decision Making  I provided patient education  I documented in the EMR     Stephanie Franklin, MD, 03/03/2021

## 2021-03-06 MED FILL — DULOXETINE 60MG: 30 days supply | Qty: 30 | Fill #2

## 2021-03-06 MED FILL — LOSARTAN POT 25MG: 30 days supply | Qty: 30 | Fill #4

## 2021-03-06 MED FILL — OMEPRAZOLE 40MG: 30 days supply | Qty: 30 | Fill #3

## 2021-03-23 ENCOUNTER — Ambulatory Visit: Payer: No Typology Code available for payment source | Attending: Family Medicine | Admitting: Family Medicine

## 2021-03-23 DIAGNOSIS — Z6833 Body mass index (BMI) 33.0-33.9, adult: Secondary | ICD-10-CM | POA: Insufficient documentation

## 2021-03-23 NOTE — Progress Notes (Signed)
Clinic Note:    SUBJECTIVE:  Stephanie [name] is a 60 year old female with the following active problem list:    Patient Active Problem List:     BMI 33.0-33.9,adult     Pure hypercholesterolemia     Family history of malignant neoplasm of gastrointestinal tract     Tubular adenoma of colon     Hyperopia with astigmatism and presbyopia     Immature cataract     Hypertension, goal below 140/90     Adenomatous polyp of colon     Squamous 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 eyes     Alcohol abuse     Depression, recurrent (HCC)     Trauma and stressor-related disorder     Nontraumatic complete tear of right rotator cuff     Localized osteoarthritis of left knee     Obesity (BMI 30-39.9)      CC:     1) obesity   -was using victoza daily which was helping, has been out x 2 wks  -continues with phentermine  -has lost 12 lbs   -needs refills of victoza     Past Medical History:  02/12/2016: Chronic right shoulder pain  No date: Depression  03/20/2012: Diverticulosis of colon      Comment:  Noted on colonoscopy 6/13   08/16/2010: Elevated hemoglobin A1c      Comment:  HEMOGLOBIN A1C (%) Date Value 03/06/2015 5.6                05/20/2014 5.4 05/08/2012 6.0 (H)   No results found for:               POCA1C   11/21/2012: Hyperopia with astigmatism and presbyopia  No date: Hypertension  11/21/2012: Immature cataract  01/21/2012: Impingement syndrome of right shoulder      Comment:  10/14: pt taking  [place] med (meloxicam 7.5 mg BID                and cyclobenzaprine); Will ask for refill once supply                finished; PCP ok with refill of flexeril on temporary                basis;   01/26/2008: Irritable bladder      Comment:  Seen by Dr Jeraldine Loots 5/09, no incontinence, likely                irritable bladder.  Trial of anticholinergic, refer to                urology, has appt Dr Minna Antis 02/23/08  02/16/2005: Leiomyoma of uterus, unspecified      Comment:  Dr Jeraldine Loots hysterectomy November, 2006; cervix retained,                last pap 11/11 neg Repeat pap/HPV 07/2015   02/15/2014: Mass of hand  07/30/2011: Other plastic surgery for unacceptable cosmetic appearance      Comment:  Breast augmentation scheduled 08/02/11;  Dr.  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   eyes  06/18/2004: Urinary calculus, unspecified    Past Surgical History:  No date: BREAST ENHANCEMENT SURGERY      Comment:  12/12 had bilateral mammoplasty and saline implants, had  2nd surgery 5/30 for revision  No date: MAMMAPLASTY AUGMENTATION W/PROSTHETIC IMPLANT  No date: MASTOPEXY  No date: OB ANTEPARTUM CARE CESAREAN DLVR & POSTPARTUM      Comment:  x3  No date: REDUCTION MAMMAPLASTY  No date: TOTAL ABDOMINAL HYSTERECT W/WO RMVL TUBE OVARY      Comment:  for fibroids, still has cervix    phentermine 15 MG capsule, Take 1 capsule by mouth every morning, Disp: 30 capsule, Rfl: 1  DULoxetine (CYMBALTA) 60 MG capsule, Take 1 capsule by mouth daily Stop taking duloxetine '30mg'$ , Disp: 30 capsule, Rfl: 5  traZODone (DESYREL) 50 MG tablet, Take 1 tablet by mouth nightly, Disp: 90 tablet, Rfl: 3  hydrochlorothiazide (HYDRODIURIL) 25 MG tablet, Take 1 tablet by mouth daily, Disp: 90 tablet, Rfl: 3  omeprazole (PRILOSEC) 40 MG capsule, TAKE 1 CAPSULE BY MOUTH DAILY, Disp: 30 capsule, Rfl: 11  losartan (COZAAR) 25 MG tablet, Take 1 tablet by mouth daily, Disp: 30 tablet, Rfl: 5  meloxicam (MOBIC) 15 MG tablet, Take 1 tablet by mouth daily, Disp: 30 tablet, Rfl: 2  polyethylene glycol (GLYCOLAX) 17 GM/SCOOP powder, Take 238 g by mouth See Admin Instructions Take as directed prior to colonoscopy, Disp: 238 g, Rfl: 0  bisacodyl (DULCOLAX) 5 MG EC tablet, Take 4 tablets by mouth See Admin Instructions Take as directed prior to colonoscopy, Disp: 4 tablet, Rfl: 0  Simethicone 80 MG TABS, Take 4 tablets by mouth See Admin Instructions Take as directed prior to  colonoscopy., Disp: 4 tablet, Rfl: 0    No current facility-administered medications on file prior to visit.      Review of Patient's Allergies indicates:  No Known Allergies    Review of patient's family history indicates:  Problem: Cancer - Other      Relation: Father          Age of Onset: (Not Specified)          Comment: stomach  Problem: Heart      Relation: Brother          Age of Onset: (Not Specified)          Comment: died at 36 during valve replacement  Problem: Heart      Relation: Brother          Age of Onset: (Not Specified)          Comment: chestpain with neg w/u  Problem: Heart      Relation: Mother          Age of Onset: (Not Specified)          Comment: Died of MI age 42  Problem: Diabetes      Relation: Mother          Age of Onset: (Not Specified)      Social History     Socioeconomic History    Marital status: Divorced     Spouse name: Not on file    Number of children: Not on file    Years of education: Not on file    Highest education level: Not on file   Occupational History    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; 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   Tobacco Use    Smoking status: Former Smoker     Types: Cigarettes     Quit date: 07/27/2001     Years since quitting: 19.6    Smokeless tobacco: Never Used   Substance and Sexual Activity    Alcohol use:  Yes     Alcohol/week: 6.0 standard drinks     Types: 6 Cans of beer per week     Comment: 6 beers per week     Drug use: No    Sexual activity: Not Currently     Partners: Male     Comment: G3P3, no hx STD, no hx abn Pap   Other Topics Concern    Not on file   Social History Narrative    08/03/19 Divorced, has 3 children, lives alone, works as a Education administrator lady    Social Determinants of Librarian, academic Strain: Not on file  Food Insecurity: Not on file  Transportation Needs: Not on file  Physical Activity: Not on file  Stress: Not on file  Social Connections: Not on file  Intimate Partner Violence: Not on file  Housing Stability:  Not on file    OBJECTIVE:  LMP 07/11/2005   Speaking in full sentences             A/P:     (Z68.33) BMI 33.0-33.9,adult  Plan:   -cont w current meds            1. The patient indicates understanding of these issues and agrees with the plan.  2.  The patient is given an After Visit Summary sheet that lists all of their medications with directions, their allergies, orders placed during this encounter, immunization dates, and follow- up instructions.  3. I reviewed the patient's medical information and medical history   4.  I reconciled the patient's medication list and prepared and supplied needed refills.  5.  I have reviewed the past medical, family, and social history sections including the medications and allergies listed in the above medical record    Marquis Lunch, PA-C, 03/23/2021

## 2021-03-25 ENCOUNTER — Telehealth (HOSPITAL_BASED_OUTPATIENT_CLINIC_OR_DEPARTMENT_OTHER): Payer: Self-pay

## 2021-03-25 NOTE — Telephone Encounter (Signed)
LVM w/ [place] Interpreter to notify pt that we scheduled tele w/ [facility] on 07/29 at 10:00 am instead. 07/28 was booked.      ===View-only below this line===  ----- Message -----  From: [name], PA-C  Sent: 03/25/2021  12:52 PM EDT  To: [facility] Televisit Followup Pool  Subject: needs f/u televisit                              Hi, can you add her at 1040 am tomorrow ? tY!      -rz

## 2021-03-27 ENCOUNTER — Ambulatory Visit (HOSPITAL_BASED_OUTPATIENT_CLINIC_OR_DEPARTMENT_OTHER): Payer: No Typology Code available for payment source | Admitting: Family Medicine

## 2021-04-10 ENCOUNTER — Ambulatory Visit (HOSPITAL_BASED_OUTPATIENT_CLINIC_OR_DEPARTMENT_OTHER): Payer: Self-pay | Admitting: Plastic Surgery

## 2021-04-13 MED FILL — LOSARTAN POT 25MG: 30 days supply | Qty: 30 | Fill #5

## 2021-04-13 MED FILL — OMEPRAZOLE 40MG: 30 days supply | Qty: 30 | Fill #4

## 2021-04-13 MED FILL — DULOXETINE 60MG: 30 days supply | Qty: 30 | Fill #3

## 2021-04-14 ENCOUNTER — Encounter (HOSPITAL_BASED_OUTPATIENT_CLINIC_OR_DEPARTMENT_OTHER): Payer: Self-pay | Admitting: Orthopaedic Surgery

## 2021-04-14 ENCOUNTER — Other Ambulatory Visit: Payer: Self-pay

## 2021-04-14 ENCOUNTER — Ambulatory Visit: Payer: No Typology Code available for payment source | Attending: Orthopaedic Surgery | Admitting: Orthopaedic Surgery

## 2021-04-14 VITALS — BP 117/69 | Ht 59.5 in | Wt 168.0 lb

## 2021-04-14 DIAGNOSIS — M25562 Pain in left knee: Secondary | ICD-10-CM | POA: Diagnosis present

## 2021-04-14 DIAGNOSIS — M1712 Unilateral primary osteoarthritis, left knee: Secondary | ICD-10-CM | POA: Insufficient documentation

## 2021-04-14 DIAGNOSIS — G8929 Other chronic pain: Secondary | ICD-10-CM

## 2021-04-14 MED ORDER — MELOXICAM 15 MG PO TABS
15.0000 mg | ORAL_TABLET | Freq: Every day | ORAL | 3 refills | Status: DC
Start: 2021-04-14 — End: 2021-06-26

## 2021-04-14 MED FILL — MELOXICAM 15MG: 30 days supply | Qty: 30 | Fill #0

## 2021-04-14 NOTE — Progress Notes (Signed)
Orthopedic Office Note    CC: Patient presents with:  Knee Pain: Left knee pain     ORTHOPEDIC PROBLEM LIST:  1. Left knee osteoarthritis   2.  Left knee pain--chronic    HPI: Stephanie [name] is a 60 year old female who presents today for evaluation of left knee pain. States her pain began years ago. There was no precipitating injury or trauma. Localizes the pain over the medial aspect of the knee. She was seen by two doctors for this previously, one being Dr. Jalene Mullet. Got a cortisone injection from [place] in 2020.     She brings paperwork to show me from a ND she saw in [place] who does regenerative care which included PRP, Umbilical cord injections, ozone injections as well as PT and therapeutic laser. This did not really work and she would like to have a total knee replacement.     The pain is now present all the time. She localizes the pain mostly medially over the knee. No locking or buckling. Feels crunching in the knee. Walking is very painful.  She works as a Electrical engineer. Denies any numbness or tingling distally. She has been taking a medication her daughter brought her from [place] which is helpful. This is meloxicam and this helps a little.  She has run out.    PT was helping but [place] was doing it in [place] and it was far to go.    Pt states her daughter is coming to the [place] in December so she would like surgery after the New Year.    [place] speaking telephone interpreter was used for this visit.     ROS: Review of systems filled out by patient and scanned into the medical record. Reviewed by me and all other systems are negative except as noted in HPI.    PMH: Past Medical History:  02/12/2016: Chronic right shoulder pain  No date: Depression  03/20/2012: Diverticulosis of colon      Comment:  Noted on colonoscopy 6/13   08/16/2010: Elevated hemoglobin A1c      Comment:  HEMOGLOBIN A1C (%) Date Value 03/06/2015 5.6                05/20/2014 5.4 05/08/2012 6.0 (H)   No results found for:                POCA1C   11/21/2012: Hyperopia with astigmatism and presbyopia  No date: Hypertension  11/21/2012: Immature cataract  01/21/2012: Impingement syndrome of right shoulder      Comment:  10/14: pt taking  [place] med (meloxicam 7.5 mg BID                and cyclobenzaprine); Will ask for refill once supply                finished; PCP ok with refill of flexeril on temporary                basis;   01/26/2008: Irritable bladder      Comment:  Seen by Dr Jeraldine Loots 5/09, no incontinence, likely                irritable bladder.  Trial of anticholinergic, refer to                urology, has appt Dr Minna Antis 02/23/08  02/16/2005: Leiomyoma of uterus, unspecified      Comment:  Dr Jeraldine Loots hysterectomy November, 2006; cervix retained,  last pap 11/11 neg Repeat pap/HPV 07/2015   02/15/2014: Mass of hand  07/30/2011: Other plastic surgery for unacceptable cosmetic appearance      Comment:  Breast augmentation scheduled 08/02/11;  Dr.  Ancil Boozer Phone#: [phone]     01/08/2016: Schistosomiasis  08/13/2016: Squamous 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   eyes  06/18/2004: Urinary calculus, unspecified    FH:  Review of patient's family history indicates:  Problem: Cancer - Other      Relation: Father          Age of Onset: (Not Specified)          Comment: stomach  Problem: Heart      Relation: Brother          Age of Onset: (Not Specified)          Comment: died at 33 during valve replacement  Problem: Heart      Relation: Brother          Age of Onset: (Not Specified)          Comment: chestpain with neg w/u  Problem: Heart      Relation: Mother          Age of Onset: (Not Specified)          Comment: Died of MI age 75  Problem: Diabetes      Relation: Mother          Age of Onset: (Not Specified)    Surgical HX: Past Surgical History:  No date: BREAST ENHANCEMENT SURGERY      Comment:  12/12 had bilateral mammoplasty and saline implants, had               2nd surgery 5/30 for revision  No  date: MAMMAPLASTY AUGMENTATION W/PROSTHETIC IMPLANT  No date: MASTOPEXY  No date: OB ANTEPARTUM CARE CESAREAN DLVR & POSTPARTUM      Comment:  x3  No date: REDUCTION MAMMAPLASTY  No date: TOTAL ABDOMINAL HYSTERECT W/WO RMVL TUBE OVARY      Comment:  for fibroids, still has cervix    SH:   Social History     Socioeconomic History    Marital status: Divorced     Spouse name: Not on file    Number of children: Not on file    Years of education: Not on file    Highest education level: Not on file   Occupational History    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; 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   Tobacco Use    Smoking status: Former Smoker     Types: Cigarettes     Quit date: 07/27/2001     Years since quitting: 19.7    Smokeless tobacco: Never Used   Substance and Sexual Activity    Alcohol use: Yes     Alcohol/week: 6.0 standard drinks     Types: 6 Cans of beer per week     Comment: 6 beers per week     Drug use: No    Sexual activity: Not Currently     Partners: Male     Comment: G3P3, no hx STD, no hx abn Pap   Other Topics Concern    Not on file   Social History Narrative    08/03/19 Divorced, has 3 children, lives alone, works as a Education administrator lady    Social Determinants of Health  Financial Resource Strain: Not on file  Food Insecurity: Not on file  Transportation Needs: Not on file  Physical Activity: Not on file  Stress: Not on file  Social Connections: Not on file  Intimate Partner Violence: Not on file  Housing Stability: Not on file    Allergies: Review of Patient's Allergies indicates:  No Known Allergies    Current Medications:   Current Outpatient Medications:     acetaminophen (TYLENOL) 500 MG tablet, Take 1,000 mg by mouth, Disp: , Rfl:     phentermine 15 MG capsule, Take 1 capsule by mouth every morning, Disp: 30 capsule, Rfl: 1    DULoxetine (CYMBALTA) 60 MG capsule, Take 1 capsule by mouth daily Stop taking duloxetine '30mg'$ , Disp: 30 capsule, Rfl: 5    traZODone (DESYREL) 50 MG tablet, Take 1  tablet by mouth nightly, Disp: 90 tablet, Rfl: 3    hydrochlorothiazide (HYDRODIURIL) 25 MG tablet, Take 1 tablet by mouth daily, Disp: 90 tablet, Rfl: 3    omeprazole (PRILOSEC) 40 MG capsule, TAKE 1 CAPSULE BY MOUTH DAILY, Disp: 30 capsule, Rfl: 11    losartan (COZAAR) 25 MG tablet, Take 1 tablet by mouth daily, Disp: 30 tablet, Rfl: 5    meloxicam (MOBIC) 15 MG tablet, Take 1 tablet by mouth daily, Disp: 30 tablet, Rfl: 2    polyethylene glycol (GLYCOLAX) 17 GM/SCOOP powder, Take 238 g by mouth See Admin Instructions Take as directed prior to colonoscopy, Disp: 238 g, Rfl: 0    bisacodyl (DULCOLAX) 5 MG EC tablet, Take 4 tablets by mouth See Admin Instructions Take as directed prior to colonoscopy, Disp: 4 tablet, Rfl: 0    Simethicone 80 MG TABS, Take 4 tablets by mouth See Admin Instructions Take as directed prior to colonoscopy., Disp: 4 tablet, Rfl: 0    IMAGING: x-ray of the left knee today shows degenerative changes with severe medial compartment narrowing. Imaging was reviewed during this visit.     PHYSICAL EXAM:  GENERAL: Alert and oriented, in no acute distress  MOOD: Appropriate.   MUSCULOSKELETAL: Examination of the left knee shows slight varus deformity. Overlying skin is warm, dry, and intact. There is no erythema, edema, or ecchymosis. There is medial joint line tenderness, no lateral joint line tenderness. There is crepitus over the patella with active flexion and extension of the knee. Active ROM 5-120 degrees. Ligaments are stable to valgus and varus stressing. Calf is soft and nontender. Neurovascularly intact distally.   Gait:  Extremely antalgic    ASSESSMENT/PLAN: 60 year old female who presents today for evaluation of left knee pain. She has severe osteoarthritis with bone on bone medially. Treatment options were discussed. She has tried several different types of injections in the knee in the past, all with minimal or no relief.     Patient has had a several year history of  progressive left knee pain.  Pain is aggravated by activities.  Patient has failed conservative interventions without sustained functional improvement including a structured exercise/physical therapy program, tylenol and NSAIDs, cane and intra-articular steroid.amd PRP injections.    Patient's quality of life is negatively impacted by the pain.  X-rays show severe degenerative changes.  Patient's exam consistent with x-ray findings with limited range of motion and tenderness.  Patient has failed non-operative treatment modalities.  Therefore, patient is a candidate for total joint replacement.    She has dentures but has some implants.  She had a cleaning two weeks ago. I gave the the dental clearance  letter to have her dentist fill it out.    She would like to have the surgery in January after the New Year as her daughter is coming to the [place] from [place] end of December.  I put in a booking as January is a popular time.    I offered a cortisone injection as she is waiting a long time for surgery, but she states they do not work for her.      Deeann Saint, MD, 04/14/2021      Nursing Communication:  None           ___ MRSA/MSSA nasal swab ( neg)  __x_ HbA1C < 8.0  _x_ BMI < 40.0  _x_ Not Smoking / Quit Smoking  __x_ Psychosocial issues discussed  _x__ No current substance abuse  __ Joint class attended  __ PT evaluation done  ___ Medical Clearance  __ Dental Clearance--has full dentures.  Some are implants and she just had them cleaned 2 weeks ago  __ If (+) Mupirocin Rx given  ___ CHG wipes given  ___ Attended PAT  ___ Case management screen

## 2021-04-16 ENCOUNTER — Telehealth (HOSPITAL_BASED_OUTPATIENT_CLINIC_OR_DEPARTMENT_OTHER): Payer: Self-pay | Admitting: Specialist

## 2021-04-16 NOTE — Telephone Encounter (Signed)
Lm at patient voice mail in portuguese for patient to call back to schedule in person TJC with Elyse PA.  I gave patient my direct line but if patient call the main number please tranfer to me or send me a staff message.

## 2021-05-08 ENCOUNTER — Telehealth (HOSPITAL_BASED_OUTPATIENT_CLINIC_OR_DEPARTMENT_OTHER): Payer: Self-pay

## 2021-05-08 NOTE — Telephone Encounter (Signed)
Nurse from [employer] called the Central Refill Department to complete a benefit analysis for the PCV20 Vaccine.    The vaccine is covered under the patients [facility] medical coverage.    Please choose Private

## 2021-05-08 NOTE — Telephone Encounter (Signed)
-----   Message from Rosaura Carpenter, LPN sent at [phone]  9:42 AM EDT -----  Regarding: PCV20  Prior Auth needed for PCV20  Thanks.

## 2021-05-11 ENCOUNTER — Ambulatory Visit: Payer: No Typology Code available for payment source | Attending: Family Medicine | Admitting: Family Medicine

## 2021-05-11 ENCOUNTER — Other Ambulatory Visit: Payer: Self-pay

## 2021-05-11 ENCOUNTER — Encounter (HOSPITAL_BASED_OUTPATIENT_CLINIC_OR_DEPARTMENT_OTHER): Payer: Self-pay | Admitting: Family Medicine

## 2021-05-11 VITALS — BP 108/67 | HR 98 | Temp 97.0°F | Ht 59.5 in | Wt 161.0 lb

## 2021-05-11 DIAGNOSIS — Z6827 Body mass index (BMI) 27.0-27.9, adult: Secondary | ICD-10-CM | POA: Diagnosis present

## 2021-05-11 DIAGNOSIS — Z6833 Body mass index (BMI) 33.0-33.9, adult: Secondary | ICD-10-CM | POA: Diagnosis present

## 2021-05-11 DIAGNOSIS — G8929 Other chronic pain: Secondary | ICD-10-CM | POA: Diagnosis present

## 2021-05-11 DIAGNOSIS — I10 Essential (primary) hypertension: Secondary | ICD-10-CM | POA: Insufficient documentation

## 2021-05-11 DIAGNOSIS — M25562 Pain in left knee: Secondary | ICD-10-CM | POA: Insufficient documentation

## 2021-05-11 LAB — BASIC METABOLIC PANEL
ANION GAP: 16 mmol/L (ref 10–22)
BUN (UREA NITROGEN): 18 mg/dL (ref 7–18)
CALCIUM: 10.2 mg/dl — ABNORMAL HIGH (ref 8.5–10.1)
CARBON DIOXIDE: 25 mmol/L (ref 21–32)
CHLORIDE: 100 mmol/L (ref 98–107)
CREATININE: 0.6 mg/dL (ref 0.4–1.2)
ESTIMATED GLOMERULAR FILT RATE: >60 mL/min (ref >60)
Glucose Random: 98 mg/dL (ref 74–160)
POTASSIUM: 4.4 mmol/L (ref 3.5–5.1)
SODIUM: 141 mmol/L (ref 136–145)

## 2021-05-11 MED ORDER — OMEPRAZOLE 40 MG PO CPDR
40.0000 mg | DELAYED_RELEASE_CAPSULE | Freq: Every day | ORAL | 11 refills | Status: DC
Start: 2021-05-11 — End: 2022-05-18

## 2021-05-11 MED ORDER — VICTOZA 18 MG/3ML SC SOPN
0.60 mg | PEN_INJECTOR | Freq: Every day | SUBCUTANEOUS | 0 refills | Status: AC
Start: 2021-05-11 — End: 2021-06-10

## 2021-05-11 MED ORDER — LOSARTAN POTASSIUM 25 MG PO TABS
25.0000 mg | ORAL_TABLET | Freq: Every day | ORAL | 2 refills | Status: DC
Start: 2021-05-11 — End: 2021-11-30

## 2021-05-11 MED FILL — VICTOZA INJ 18MG/3ML: 20 days supply | Qty: 6 | Fill #0

## 2021-05-11 MED FILL — OMEPRAZOLE 40MG: 30 days supply | Qty: 30 | Fill #0

## 2021-05-11 MED FILL — LOSARTAN POT 25MG: 90 days supply | Qty: 90 | Fill #0

## 2021-05-11 NOTE — Progress Notes (Signed)
BP 108/67    Pulse 98    Temp 97 F (36.1 C) (Temporal)    Ht 4' 11.5" (1.511 m)    Wt 73 kg (161 lb)    LMP 07/11/2005    SpO2 97%    BMI 31.97 kg/m     S:    Stephanie [name] is a 60 year old female who presents with:    BRIEF DIET QUESTIONNAIRE:     Average Day (e.g. Yesterday):     Breakfast: skips  Lunch: 12-1pm - rice and beans, meat lettuce tomatos, shushu   Dinner: salad rice pasta  Snacks: none  Drinks: water and beer on weekend    Where eating meals (e.g. Table, car, computer/TV): table, eating only    How much time to eat a meal?: fast    How often eats a meal out?: rare      BP at home 120/130s ish    Past Medical History:  02/12/2016: Chronic right shoulder pain  No date: Depression  03/20/2012: Diverticulosis of colon      Comment:  Noted on colonoscopy 6/13   08/16/2010: Elevated hemoglobin A1c      Comment:  HEMOGLOBIN A1C (%) Date Value 03/06/2015 5.6                05/20/2014 5.4 05/08/2012 6.0 (H)   No results found for:               POCA1C   11/21/2012: Hyperopia with astigmatism and presbyopia  No date: Hypertension  11/21/2012: Immature cataract  01/21/2012: Impingement syndrome of right shoulder      Comment:  10/14: pt taking  [place] med (meloxicam 7.5 mg BID                and cyclobenzaprine); Will ask for refill once supply                finished; PCP ok with refill of flexeril on temporary                basis;   01/26/2008: Irritable bladder      Comment:  Seen by Dr Jeraldine Loots 5/09, no incontinence, likely                irritable bladder.  Trial of anticholinergic, refer to                urology, has appt Dr Minna Antis 02/23/08  02/16/2005: Leiomyoma of uterus, unspecified      Comment:  Dr Jeraldine Loots hysterectomy November, 2006; cervix retained,               last pap 11/11 neg Repeat pap/HPV 07/2015   02/15/2014: Mass of hand  07/30/2011: Other plastic surgery for unacceptable cosmetic appearance      Comment:  Breast augmentation scheduled 08/02/11;  Dr.  Ancil Boozer Phone#:  [phone]     01/08/2016: Schistosomiasis  08/13/2016: Squamous 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   eyes  06/18/2004: Urinary calculus, unspecified    SH and FH reviewed and updated as needed.    ROS: Per HPI. No reported headaches, weakness/falls, chest pain, shortness of breath, abdominal pain/nausea/vomiting, numbness/tingling, fevers or chills.     Medications, allergies, PMH and SH reviewed and updated as necessary in Epic.    O:  PHYSICAL EXAM:  BP 108/67    Pulse 98    Temp  66 F (36.1 C) (Temporal)    Ht 4' 11.5" (1.511 m)    Wt 73 kg (161 lb)    LMP 07/11/2005    SpO2 97%    BMI 31.97 kg/m   GEN: NAD  HEENT: MMM  SKIN: WWP  CHEST: Breathing comfortably  NEURO: No focal deficits noted, alert  MSK: Limping gait observed  PSYCH: Appears stated age, appropriate grooming and dress, appropriate affect and mood congruent    A/P:  Stephanie [name] is a 60 year old female presents with:    1. BMI 33.0-33.9,adult  Reviewed diet   Needs to increased veggies and eat more frequently   Reviewed meds   Retrial victoza -   Need exercise - reviewed knee friendly exercises and needs to get PT  - losartan (COZAAR) 25 MG tablet; Take 1 tablet by mouth in the morning.  Dispense: 90 tablet; Refill: 2  - liraglutide (VICTOZA) 18 MG/3ML pen injector; Inject 0.6 mg under the skin in the morning. X 1 wk then 1.'2mg'$  daily x 1wk then 1.'8mg'$  daily x 1 wk.  Dispense: 3 mL; Refill: 0    2. Chronic pain of left knee  - losartan (COZAAR) 25 MG tablet; Take 1 tablet by mouth in the morning.  Dispense: 90 tablet; Refill: 2  - liraglutide (VICTOZA) 18 MG/3ML pen injector; Inject 0.6 mg under the skin in the morning. X 1 wk then 1.'2mg'$  daily x 1wk then 1.'8mg'$  daily x 1 wk.  Dispense: 3 mL; Refill: 0    3. Hypertension, goal below 140/90  Reviewed on low side today but at home is appropriate - no changes for now      We discussed the patients current medications. The patient expressed understanding and no barriers to adherence were  identified.    F/u tele 1 moand  as needed    Health Maintenance:  Reviewed HM    Lemar Livings, MD

## 2021-05-14 ENCOUNTER — Ambulatory Visit (HOSPITAL_BASED_OUTPATIENT_CLINIC_OR_DEPARTMENT_OTHER): Payer: No Typology Code available for payment source | Admitting: Physician Assistant

## 2021-05-19 ENCOUNTER — Ambulatory Visit (HOSPITAL_BASED_OUTPATIENT_CLINIC_OR_DEPARTMENT_OTHER): Payer: No Typology Code available for payment source | Admitting: Physician Assistant

## 2021-05-19 MED FILL — TRAZODONE 50MG: 90 days supply | Qty: 90 | Fill #2

## 2021-05-19 MED FILL — HYDROCHLOROTHIAZIDE 25MG: 90 days supply | Qty: 90 | Fill #2

## 2021-05-19 MED FILL — MELOXICAM 15MG: 30 days supply | Qty: 30 | Fill #1

## 2021-05-19 MED FILL — DULOXETINE 60MG: 30 days supply | Qty: 30 | Fill #4

## 2021-05-20 NOTE — Progress Notes (Signed)
Patient was unable to connect to the televideo visit.  Link re sent multiple times.  Decided to have patient come in for face to face class.  She will have the class with me at [facility] clinic on 05/29/21 at 1:15 pm.  Telephone portuguese interpreter used for visit.

## 2021-05-22 ENCOUNTER — Other Ambulatory Visit (HOSPITAL_BASED_OUTPATIENT_CLINIC_OR_DEPARTMENT_OTHER): Payer: Self-pay | Admitting: Family Medicine

## 2021-05-22 NOTE — Telephone Encounter (Addendum)
Patient needs pen needles for the victoza. RPH recommended this size needles. Please approve if appropriate. Thank you.

## 2021-05-23 MED ORDER — BD PEN NEEDLE NANO 2ND GEN 32G X 4 MM MISC
1 refills | Status: AC
Start: 2021-05-23 — End: 2021-07-21

## 2021-05-25 MED FILL — BD PEN NEEDL MIS 32GX4MM (NANO): 30 days supply | Qty: 30 | Fill #0 | Status: CP

## 2021-05-29 ENCOUNTER — Ambulatory Visit (HOSPITAL_BASED_OUTPATIENT_CLINIC_OR_DEPARTMENT_OTHER): Payer: Self-pay | Admitting: Physician Assistant

## 2021-06-01 ENCOUNTER — Ambulatory Visit (HOSPITAL_BASED_OUTPATIENT_CLINIC_OR_DEPARTMENT_OTHER): Payer: Self-pay | Admitting: Family Medicine

## 2021-06-10 ENCOUNTER — Ambulatory Visit (HOSPITAL_BASED_OUTPATIENT_CLINIC_OR_DEPARTMENT_OTHER): Payer: No Typology Code available for payment source | Admitting: Rehabilitative and Restorative Service Providers"

## 2021-06-10 DIAGNOSIS — G8929 Other chronic pain: Secondary | ICD-10-CM | POA: Insufficient documentation

## 2021-06-10 DIAGNOSIS — M1712 Unilateral primary osteoarthritis, left knee: Secondary | ICD-10-CM | POA: Diagnosis present

## 2021-06-10 DIAGNOSIS — M25562 Pain in left knee: Secondary | ICD-10-CM | POA: Diagnosis present

## 2021-06-10 MED FILL — OMEPRAZOLE 40MG: 30 days supply | Qty: 30 | Fill #1

## 2021-06-10 NOTE — Progress Notes (Signed)
OUTPATIENT PHYSICAL THERAPY EVALUATION    Referring Physician: Deeann Saint, MD  Date of Onset: chronic    Chronic pain of left knee pain: M25.562        Subjective:   Pt presents with a long history of left knee pain for many years, no particular injury noted, along the inside aspect of the knee. She has had multiple injections, which provide some relief. Her leg doesn't lock nor give out, but the pain is worsened with standing, walking, much difficulty with going up and down the stairs, pain with lifting, bending squatting, works as a Electrical engineer. She notes a sensation of crunching with movement of her knee. X-rays reveal presence of severe osteoarthritis. She is scheduled for joint replacement for 09/09/21. Minor help from ice and heat. As of late, her right knee has also been feeling painful with clicking, making it difficult to move.        Pain:    Pre-morbid Functional Level: chronic  Functional Deficits: Stairs (step-to pattern with rail), pain and difficulty with standing, walking short distances, bending, squatting, lifting         Occupation: house cleaning  Dressing/Grooming: pain with bending for LB dressing  Driving: n/a  Sleeping: n/a    Primary Language: [place] ; Requires Interpreter: yes     Objective:  Please Note: Only populated fields were assessed by provider, fields left blank were not assessed.      Palpation: ttp medial joint line    Range of Motion and Strength:   LEFT LEFT RIGHT RIGHT    A/PROM MMT A/PROM MMT   HIP         FLEX  4+/5  4+/5     EXT         IR         ER  4-/5  4-/5     ABD  3+/5  3+/5     ADD       KNEE         FLEX WNL 4-/5 WNL 4-/5     EXT WNL 4-/5 WNL 4-/5 (pain along proximal shin)   ANKLE         DF         PF         INV         EV         Joint Mobility:    Patella, all directions: 4+/6      KNEE L R     Valgus Stress +      Varus Stress       Apley's       McMurray's +      Ant/Post Drawer       Lachman's       Patellar Apprehension     Patellar  Compression +    ANKLE       Ant Drawer       Talar Tilt       SLS 8 seconds min sway <1 second with mod sway       Gait: Pt amb I with no AD, lack of TKE, inc trunk lean, dec step length and cadence         06/10/21 0900   Language Information   Language of Care Portuguese   Interpreter Yes   Rehab Discipline   Rehab Discipline PT   Visit   Visit number 1   POC Due date 11/11   Time Calculation  Start Time 0800   Stop Time 0855   Time Calculation (min) 55 min   Ther Exercise   Exercise ppt with hip add, SAQ L, quad stretch of EOB   Ther Exercise 2   Exercise (Cont with side clamshells and hip abduction, ppt with resisted knee fall outs and marching, balance)   Patient Education   What was taught? PT POC, HEP   Method Written;Practice;Demo;Verbal   Patient comprehension Yes         Physical Therapy Plan of Care    MD: Orie Fisherman, MD  Referring Provider: Deeann Saint, MD  Diagnosis: left knee pain    Assessment:   Pt presents with a long history of left knee pain, x-rays reveal presence of severe osteoarthritis. Pain makes all activities painful and difficult, she demo good AROM B, with decreased strength proximally and distally, dec SLS, increased patellar mobility and ttp of medial joint line. She also experienced a "nerve-like" pain behind her right leg, was educated on pelvic steering in conjunction with her hip exercises and notes improvement of said pain following the eval. She is scheduled for TKA in January, and will benefit from skilled PT to address deficits to maximize capacity of knee complex prior to surgery to assist with outcomes and maximize function over the next three months to assist with ADL's.        Prognosis: fair    Short Term Goals: 3 weeks  1. Dec pain by 2  2. Patellar mobility 3+/6 to assist with arthrokinemtics with ADL's  3. Inc MMT by 1/3  4. I with HEP    Long Term Goals: 6 weeks    1. Inc MMT of LE by 1 muscle grade to facilitate normal mechanics throughout lower chain to  assist with walking tolerance and management of stairs at home and with community ambulation  2. Inc SLS to 30 sec with min-no sway on level surface to promote inc proprioception to assist with gait with even and uneven surfaces  3. Pt will be able to squat to chair x10 to promote functional movements and personal & occupational responsibilities    Physical Therapy will consist of:   ** PT Eval - Low Complexity (CPT 97161)  ** PT Re-Eval (CPT 97164)  ** Stretching/ROM/Therapeutic Exercise (97110)  ** Home Exercise Program/ Patient Education (CPT [phone])  ** Neuromuscular Re-education (CPT [phone])  ** Manual Therapy / Joint / Soft tissue Mobilization (CPT 97140)  ** Functional Activities (CPT 97530)    Recommend physical therapy 1 times per week for 4 to 6 weeks, after which a reassessment for future physical therapy needs will occur.    Discussed and educated patient on HEP, prognosis, and POC. Patient is agreeable to treatment plan and is aware of attendance policy.    Patient Regis Bill is aware of attendance policy: Yes  Plan of care discussed with Patient/Family: Yes  Patient goals reviewed and incorporated in plan of care: Yes  Patient/Family agrees with plan of care: Yes  Patient/Family education: Yes  Does patient feel safe at home: Yes         Hurman Horn, PT, DPT  [facility], [place], Lic # [id number]

## 2021-06-10 NOTE — Progress Notes (Signed)
I certify that the documented Treatment Plan is reasonable and necessary.    06/10/2021  Deeann Saint, MD

## 2021-06-13 ENCOUNTER — Emergency Department (HOSPITAL_BASED_OUTPATIENT_CLINIC_OR_DEPARTMENT_OTHER): Payer: No Typology Code available for payment source

## 2021-06-13 ENCOUNTER — Emergency Department
Admission: EM | Admit: 2021-06-13 | Discharge: 2021-06-13 | Disposition: A | Discharge status: Home / Self Care | Payer: No Typology Code available for payment source | Attending: Emergency Medicine | Admitting: Emergency Medicine

## 2021-06-13 DIAGNOSIS — M1711 Unilateral primary osteoarthritis, right knee: Secondary | ICD-10-CM | POA: Diagnosis not present

## 2021-06-13 DIAGNOSIS — S8391XA Sprain of unspecified site of right knee, initial encounter: Secondary | ICD-10-CM | POA: Insufficient documentation

## 2021-06-13 DIAGNOSIS — M7122 Synovial cyst of popliteal space [Baker], left knee: Secondary | ICD-10-CM

## 2021-06-13 DIAGNOSIS — M7121 Synovial cyst of popliteal space [Baker], right knee: Secondary | ICD-10-CM | POA: Diagnosis not present

## 2021-06-13 DIAGNOSIS — Y929 Unspecified place or not applicable: Secondary | ICD-10-CM | POA: Diagnosis not present

## 2021-06-13 DIAGNOSIS — Y999 Unspecified external cause status: Secondary | ICD-10-CM | POA: Diagnosis not present

## 2021-06-13 DIAGNOSIS — Y939 Activity, unspecified: Secondary | ICD-10-CM | POA: Diagnosis not present

## 2021-06-13 DIAGNOSIS — M7918 Myalgia, other site: Secondary | ICD-10-CM

## 2021-06-13 DIAGNOSIS — M25561 Pain in right knee: Secondary | ICD-10-CM

## 2021-06-13 MED ORDER — ACETAMINOPHEN 500 MG PO TABS
1000.0000 mg | ORAL_TABLET | Freq: Once | ORAL | Status: AC
Start: 2021-06-13 — End: 2021-06-13
  Administered 2021-06-13: 1000 mg via ORAL
  Filled 2021-06-13: qty 2

## 2021-06-13 MED ORDER — KETOROLAC TROMETHAMINE 30 MG/ML INJ
15.00 mg | Freq: Once | Status: AC
Start: 2021-06-13 — End: 2021-06-13
  Administered 2021-06-13: 15 mg via INTRAMUSCULAR
  Filled 2021-06-13: qty 1

## 2021-06-13 NOTE — Discharge Instructions (Signed)
Voc foi visto no departamento de emergncia para avaliao de sua dor no joelho direito.    Seu ultrassom mostrou um cisto de Child psychotherapist ou uma coleo de fluido atrs do joelho. Isso no  perigoso. No entanto, est causando seus sintomas. Use o envoltrio Ace, muletas e acompanhamento com ortopedia. Enviei uma mensagem para eles e o nmero est listado acima    Acompanhamento com seu mdico de cuidados primrios dentro de 5 dias para avaliar a resoluo de seus sintomas. Retorne ao pronto-socorro se os sintomas piorarem, voc desenvolver novos sintomas ou se no puder agendar o acompanhamento adequado.    You were seen in the emergency department for evaluation of your right knee pain.    Your ultrasound showed a Baker's cyst or a collection of fluid behind the knee.  This is not dangerous.  It is causing your symptoms however.  Use the Ace wrap, crutches and follow-up with orthopedics.  I sent a message to them and the number is listed above    Followup with your primary care provider within 5 days to evaluate for the resolution of her symptoms.  Return to the emergency department if symptoms worsen, you develop new symptoms or if you cannot schedule appropriate followup care.

## 2021-06-13 NOTE — ED Triage Note (Signed)
Pt complains of 10/10 rt knee and calf pain since Sunday . Last tylenol 11 pm . Pos pedal pulse , no new edema

## 2021-06-13 NOTE — ED Notes (Signed)
ED Attending Sign-out Note  Patient received in signout at change of shift.  Available records reviewed.    ED Course as of 06/13/21 1536   Sat Jun 13, 2021   1535 US showing baker's cyst. XR no emergent findings my wet read. To f/u with PCP.   1502 Signout: await [place] r/o DVT           Impression:    Sprain of right knee, unspecified ligament, initial encounter  Synovial cyst of right popliteal space  Musculoskeletal pain        Disposition:    Discharge     Nyra Capes, MD

## 2021-06-13 NOTE — ED Provider Notes (Signed)
eMERGENCY dEPARTMENT Physician Assistant NOTE    The ED nursing record was reviewed.   The prior medical records as available electronically through Epic were reviewed.  The mode of arrival was Self      A [place] interpreter was used.    This patient was seen with Emergency Department attending physician Dr. Gwenlyn Perking    CHIEF COMPLAINT    Patient presents with:  Leg Pain      HPI    Stephanie [name] is a 60 year old female who presents to the emergency department for evaluation of right knee and calf pain initially developing 6 days ago.  She was walking to her car when she felt a pop in the medial/posterior knee.  Initially she did not have much pain although has developed increased pain surrounding the knee since.  She is able to ambulate.  No prior injury or surgery to this extremity.    No numbness, tingling or decrease in sensation.  No fevers.  Followed by Dr. Katy Apo      PAST MEDICAL HISTORY    Past Medical History:  02/12/2016: Chronic right shoulder pain  No date: Depression  03/20/2012: Diverticulosis of colon      Comment:  Noted on colonoscopy 6/13   08/16/2010: Elevated hemoglobin A1c      Comment:  HEMOGLOBIN A1C (%) Date Value 03/06/2015 5.6                05/20/2014 5.4 05/08/2012 6.0 (H)   No results found for:               POCA1C   11/21/2012: Hyperopia with astigmatism and presbyopia  No date: Hypertension  11/21/2012: Immature cataract  01/21/2012: Impingement syndrome of right shoulder      Comment:  10/14: pt taking  [place] med (meloxicam 7.5 mg BID                and cyclobenzaprine); Will ask for refill once supply                finished; PCP ok with refill of flexeril on temporary                basis;   01/26/2008: Irritable bladder      Comment:  Seen by Dr Jeraldine Loots 5/09, no incontinence, likely                irritable bladder.  Trial of anticholinergic, refer to                urology, has appt Dr Minna Antis 02/23/08  02/16/2005: Leiomyoma of uterus, unspecified      Comment:  Dr  Jeraldine Loots hysterectomy November, 2006; cervix retained,               last pap 11/11 neg Repeat pap/HPV 07/2015   02/15/2014: Mass of hand  07/30/2011: Other plastic surgery for unacceptable cosmetic appearance      Comment:  Breast augmentation scheduled 08/02/11;  Dr.  Ancil Boozer Phone#: [phone]     01/08/2016: Schistosomiasis  08/13/2016: Squamous 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   eyes  06/18/2004: Urinary calculus, unspecified    PROBLEM LIST  Patient Active Problem List:     BMI 33.0-33.9,adult     Pure hypercholesterolemia     Family history of malignant neoplasm of gastrointestinal tract     Tubular adenoma of colon  Hyperopia with astigmatism and presbyopia     Immature cataract     Hypertension, goal below 140/90     Adenomatous polyp of colon     Squamous 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 eyes     Alcohol abuse     Depression, recurrent (HCC)     Trauma and stressor-related disorder     Nontraumatic complete tear of right rotator cuff     Localized osteoarthritis of left knee     Obesity (BMI 30-39.9)     Chronic pain of left knee      SURGICAL HISTORY    Past Surgical History:  No date: BREAST ENHANCEMENT SURGERY      Comment:  12/12 had bilateral mammoplasty and saline implants, had               2nd surgery 5/30 for revision  No date: MAMMAPLASTY AUGMENTATION W/PROSTHETIC IMPLANT  No date: MASTOPEXY  No date: OB ANTEPARTUM CARE CESAREAN DLVR & POSTPARTUM      Comment:  x3  No date: REDUCTION MAMMAPLASTY  No date: TOTAL ABDOMINAL HYSTERECT W/WO RMVL TUBE OVARY      Comment:  for fibroids, still has cervix    CURRENT MEDICATIONS    No current facility-administered medications for this encounter.    Current Outpatient Medications:     Insulin Pen Needle (BD PEN NEEDLE NANO 2ND GEN) 32G X 4 MM MISC, Use as directed, Disp: 30 each, Rfl: 1    losartan (COZAAR) 25 MG tablet, Take 1 tablet by mouth in the morning., Disp: 90 tablet, Rfl: 2    omeprazole  (PRILOSEC) 40 MG capsule, Take 1 capsule by mouth in the morning., Disp: 30 capsule, Rfl: 11    acetaminophen (TYLENOL) 500 MG tablet, Take 1,000 mg by mouth, Disp: , Rfl:     meloxicam (MOBIC) 15 MG tablet, Take 1 tablet by mouth in the morning., Disp: 30 tablet, Rfl: 3    phentermine 15 MG capsule, Take 1 capsule by mouth every morning, Disp: 30 capsule, Rfl: 1    DULoxetine (CYMBALTA) 60 MG capsule, Take 1 capsule by mouth daily Stop taking duloxetine 30mg , Disp: 30 capsule, Rfl: 5    traZODone (DESYREL) 50 MG tablet, Take 1 tablet by mouth nightly, Disp: 90 tablet, Rfl: 3    hydrochlorothiazide (HYDRODIURIL) 25 MG tablet, Take 1 tablet by mouth daily, Disp: 90 tablet, Rfl: 3    meloxicam (MOBIC) 15 MG tablet, Take 1 tablet by mouth daily, Disp: 30 tablet, Rfl: 2    ALLERGIES    Review of Patient's Allergies indicates:  No Known Allergies    FAMILY HISTORY    Review of patient's family history indicates:  Problem: Cancer - Other      Relation: Father          Age of Onset: (Not Specified)          Comment: stomach  Problem: Heart      Relation: Brother          Age of Onset: (Not Specified)          Comment: died at 78 during valve replacement  Problem: Heart      Relation: Brother          Age of Onset: (Not Specified)          Comment: chestpain with neg w/u  Problem: Heart      Relation: Mother          Age of  Onset: (Not Specified)          Comment: Died of MI age 77  Problem: Diabetes      Relation: Mother          Age of Onset: (Not Specified)      SOCIAL HISTORY    Social History     Socioeconomic History    Marital status: Divorced     Spouse name: Not on file    Number of children: Not on file    Years of education: Not on file    Highest education level: Not on file   Occupational History    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; 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   Tobacco Use    Smoking status: Former Smoker     Types: Cigarettes     Quit date: 07/27/2001     Years since quitting: 19.8     Smokeless tobacco: Never Used   Substance and Sexual Activity    Alcohol use: Yes     Alcohol/week: 6.0 standard drinks     Types: 6 Cans of beer per week     Comment: 6 beers per week     Drug use: No    Sexual activity: Not Currently     Partners: Male     Comment: G3P3, no hx STD, no hx abn Pap   Other Topics Concern    Not on file   Social History Narrative    08/03/19 Divorced, has 3 children, lives alone, works as a Education administrator lady    Social Determinants of Librarian, academic Strain: Not on file  Food Insecurity: Not on file  Transportation Needs: Not on file  Physical Activity: Not on file  Stress: Not on file  Social Connections: Not on file  Intimate Partner Violence: Not on file  Housing Stability: Not on file    REVIEW OF SYSTEMS    The pertinent positives are reviewed in the HPI above. All other systems were reviewed and are negative.    PHYSICAL EXAM      BP 120/82    Pulse 77    Temp 97.3 F    Resp 18    LMP 07/11/2005    SpO2 97%   GENERAL:  Well-appearing, no distress.  SKIN:  Warm & Dry, no rash, no bruising.  HEAD:  Atraumatic.     LUNGS:  Clear to auscultation bilaterally without rales, rhonchi or wheezing.   HEART:  Regular rate and rhythm.  No murmurs, rubs, or gallops.   ABDOMEN:  Soft, flat, without distension.  Nontender to palpation.   MUSCULOSKELETAL:  No deformities. Well-perfused extremities with  2+ DP/PT/Rad pulses bilaterally. No cyanosis or edema.  Right lower extremity:  No notable effusion or any skin changes.  She is able to range the knee actively to almost extremes, limited due to pain  No specific posterior knee or calf tenderness, rather diffuse tenderness around the entire knee and lower leg.  No skin changes or skin insult  Negative Thompson test + CSM's distally    RESULTS  No results found for this visit on 06/13/21 (from the past 24 hour(s)).     RADIOLOGY  [place] RIGHT LE:  CLINICAL INFORMATION: ? cyst vs dvt felt pressure in knee 1 week ago while walking      COMPARISON: None     TECHNIQUE:   Ultrasound of the right lower extremity deep venous system was performed with a linear transducer in real time.  Static images were obtained and presented for interpretation.     Color Doppler with spectral waveform analysis was performed.     FINDINGS:     The right common femoral, femoral, popliteal, and visualized greater saphenous veins are patent with normal compressibility and flow. Normal respiratory variation, augmentation response and Valsalva response are demonstrated. There is no intraluminal thrombus.      The contralateral common femoral vein is patent.      Calf veins: The visualized calf veins demonstrate normal color Doppler flow.     Visualized soft tissues: There is a 4.1 x 0.7 x 3.2 cm fluid collection in the popliteal fossa.     IMPRESSION:     1. No sonographic evidence of right lower extremity deep venous thrombus.   2. Popliteal cyst.         Reviewed and Electronically Signed by: Frances Maywood MD   Signed Date/Time: 06-13-2021 15:22:37          XRAY RIGHT KNEE:      MEDICATIONS ADMINISTERED ON THIS VISIT  Orders Placed This Encounter      acetaminophen (TYLENOL) tablet 1,000 mg      ketorolac (TORADOL) injection 15 mg      ED COURSE & MEDICAL DECISION MAKING      I reviewed the patient's past medical history/problem list, past surgical history, medication list, social history and allergies. Pt remained hemodynamically stable during their stay in the emergency department.       ED Decision Making & Course:     This is a 60 year old female presenting to the emergency department for evaluation of right lower extremity pain after she felt a pop in the posterior/medial knee 6 days ago while walking.  No obvious skin changes or areas of swelling.  Additionally, the area of discomfort is very diffuse over the knee and lower leg without specific calf tenderness.    She was given Toradol and Tylenol with improvement of her pain.  Ultrasound revealed no  evidence of DVT, notes popliteal cyst.  X-ray within normal limits.  She will follow-up with orthopedics, was given a Ace wrap and crutches and the reasons to return to the ED outlined.    Discharge planning provided by me with the [place] interpreter, she expressed understanding    Condition: Stable  Disposition: Home      Diagnosis:   Sprain of right knee, unspecified ligament, initial encounter  Synovial cyst of right popliteal space  Musculoskeletal pain     [employer], PA-C

## 2021-06-18 MED FILL — DULOXETINE 60MG: 30 days supply | Qty: 30 | Fill #5

## 2021-06-19 ENCOUNTER — Ambulatory Visit (HOSPITAL_BASED_OUTPATIENT_CLINIC_OR_DEPARTMENT_OTHER): Payer: Self-pay | Admitting: Rehabilitative and Restorative Service Providers"

## 2021-06-19 ENCOUNTER — Ambulatory Visit (HOSPITAL_BASED_OUTPATIENT_CLINIC_OR_DEPARTMENT_OTHER): Payer: No Typology Code available for payment source | Admitting: Rehabilitative and Restorative Service Providers"

## 2021-06-19 ENCOUNTER — Other Ambulatory Visit: Payer: Self-pay

## 2021-06-19 DIAGNOSIS — M25562 Pain in left knee: Secondary | ICD-10-CM | POA: Diagnosis present

## 2021-06-19 DIAGNOSIS — G8929 Other chronic pain: Secondary | ICD-10-CM

## 2021-06-19 DIAGNOSIS — M1712 Unilateral primary osteoarthritis, left knee: Secondary | ICD-10-CM | POA: Diagnosis present

## 2021-06-19 NOTE — Progress Notes (Signed)
Subjective:    She experienced a painful "popping" sensation when walking, went to the ER, will be following up with MD in the upcoming week, possible presence of Baker's Cyst.     Objective:       06/19/21 0700   Language Information   Language of Care Portuguese   Interpreter Yes   Name/ID Emmett PT   Visit   Visit number 2   POC Due date 11/11   Time Calculation   Start Time 0734   Stop Time 0759   Time Calculation (min) 25 min   Manual Therapy   Technique patellar mobs, grade II   Time in minutes 4'   Manual Therapy 2   Technique distal quad, proximal calf STM   Time in minutes 10'   Ther Exercise   Exercise Quad set   Reps 10   Sets 3   Holds hold 5'   Ther Exercise 2   Exercise foot dangle   Holds 2 4#, x5 minutes L & R   Patient Education   What was taught? PT POC,. HEP   Method Written;Practice;Demo;Verbal   Patient comprehension Yes       Assessment:    Pt presents with increased pain of their knee, after felling a popping, painful sensation when walking a few days ago. Her pain and weight bearing as been poor, today's session focused on manual techniques which helped reduce tonicity throughout related structures.    Plan:    Cont per tol, pending upcoming appointment with MD. Standing TKE with pillow and pulley, bike, MET.     Jeani Sow, [place], Lic # [id number]

## 2021-06-23 ENCOUNTER — Encounter (HOSPITAL_BASED_OUTPATIENT_CLINIC_OR_DEPARTMENT_OTHER): Payer: Self-pay | Admitting: Orthopaedic Surgery

## 2021-06-23 ENCOUNTER — Other Ambulatory Visit: Payer: Self-pay

## 2021-06-23 ENCOUNTER — Ambulatory Visit: Payer: No Typology Code available for payment source | Attending: Orthopaedic Surgery | Admitting: Orthopaedic Surgery

## 2021-06-23 DIAGNOSIS — M76891 Other specified enthesopathies of right lower limb, excluding foot: Secondary | ICD-10-CM

## 2021-06-23 MED ORDER — METHYLPREDNISOLONE ACETATE 20 MG/ML IJ SUSP
20.00 mg | Freq: Once | INTRAMUSCULAR | 0 refills | Status: AC
Start: 2021-06-23 — End: 2021-06-23

## 2021-06-23 MED ORDER — DICLOFENAC SODIUM 1 % EX GEL
4.00 g | Freq: Two times a day (BID) | CUTANEOUS | 1 refills | Status: AC | PRN
Start: 2021-06-23 — End: 2021-07-23

## 2021-06-23 MED FILL — DICLOFENAC GEL 1%: 12 days supply | Qty: 100 | Fill #0

## 2021-06-23 NOTE — Progress Notes (Signed)
Date of service: 06/23/2021         This is a 60 year old female who presents for 2 weeks of atraumatic right knee pain.  She is known condition of left knee arthritis for which I saw her 2 years ago and she is actually going on to proceed with knee arthroplasty the beginning of next year with Dr. Madelaine Etienne.  She thinks she has been over compensating on the right side.  She started to notice pain and a pulling sensation over the back of her leg and into the medial aspect of the knee.  She has not noted any swelling, locking or catching.  I also incidentally saw her in the past for a right shoulder rotator cuff tear.  We discussed surgical treatment but she reports that since then her shoulder symptoms have resolved and she has no issues currently    Physical examination:   General - the patient is alert and oriented x3, well appearing, in no acute distress.  Normal affect and mood.  Normal pupil dilation, extraocular motions intact  Nonlabored breathing  Right knee demonstrates mild antalgic gait.  No effusion.  Skin intact.  No tenderness over the medial or lateral joint line.  No tenderness in the popliteal fossa.  There is tenderness over the distal medial hamstring tendons and pes bursa.  Ligaments are stable.  Motion 0-130 degrees.  Compartments soft.  Strength 5/5.  Neurovascular intact    Radiographs of the right knee from 06/13/2021 are personally reviewed.  Mild degenerative changes with medial compartment narrowing and peripheral spurring.  No bony abnormalities  Ultrasound of the right knee from 06/13/2021 demonstrates a 4.1 x0.7 x 3.2 cm popliteal cyst      Assessement: Right knee medial hamstring tendinitis and pes bursitis.    Plan: I reviewed the findings with the   patient via telephone interpreter.  Her symptoms are consistent with a tendinitis and bursitis.  While she does have some radiographic arthritis she does not seem distinctly symptomatic from that.  I reviewed conservative modalities  including compression wrapping and icing.  She was given home exercises and a prescription for diclofenac gel.  I did also offer a cortisone injection for the pes bursa, for which she wished to proceed. A time out was held confirming the site of injection and laterality. After verbal consent and sterile prep, the right knee was injected with a combination of 20 mg of methylprednisolone and 2 cc of 0.5% Marcaine .  A bandage was applied.  There were no complications.  I advised the patient to ice the area down later today.  Going forward she may follow-up with Dr. Cruzita Lederer Deck with whom she is scheduled to have left knee arthroplasty surgery in 2 months.      All questions were answered.    This note was prepared using voice recognition software.  Please disregard any transcription errors.

## 2021-06-26 ENCOUNTER — Other Ambulatory Visit: Payer: Self-pay

## 2021-06-26 ENCOUNTER — Ambulatory Visit: Payer: No Typology Code available for payment source | Attending: Orthopaedic Surgery | Admitting: Physician Assistant

## 2021-06-26 ENCOUNTER — Ambulatory Visit
Payer: No Typology Code available for payment source | Attending: Orthopaedic Surgery | Admitting: Rehabilitative and Restorative Service Providers"

## 2021-06-26 DIAGNOSIS — M1712 Unilateral primary osteoarthritis, left knee: Secondary | ICD-10-CM

## 2021-06-26 DIAGNOSIS — M25562 Pain in left knee: Secondary | ICD-10-CM | POA: Diagnosis present

## 2021-06-26 DIAGNOSIS — G8929 Other chronic pain: Secondary | ICD-10-CM | POA: Diagnosis present

## 2021-06-26 MED ORDER — MELOXICAM 15 MG PO TABS
15.0000 mg | ORAL_TABLET | Freq: Every day | ORAL | 3 refills | Status: DC
Start: 2021-06-26 — End: 2021-08-03

## 2021-06-26 MED FILL — MELOXICAM 15MG: 30 days supply | Qty: 30 | Fill #0

## 2021-06-26 NOTE — Progress Notes (Signed)
Orthopedic Office Note    CC: No chief complaint on file.      Language: [place] [phone]  The office visit was conducted with the help of an interpreter. The interpreter was on the phone during the entire visit.    ORTHOPEDIC PROBLEM LIST:  1. Left knee OA    HPI: Stephanie [name] is a 60 year old female who presents for her total joint education class.  It is a pre-op requirement for her total knee arthroplasty which is scheduled in January 2023.  She has chronic pain in the left knee.  She failed conservative management.  She does not have diabetes and she does not smoke cigarettes or e-cigarettes.  She quit smoking back in 2002.  She does not have a history of cardiac disease or pulmonary disease.  She is due to have a cleaning with her dentist and she has the letter to bring to the dental visit to get clearance.  Her son will be able to drive her to appointments after surgery.     works as a Scientist, product/process development.    ROS: No recent F/C/N/V, CP, SOB, cough, abd pain, weight loss, headache, dizziness, numbness, paresthesias, weakness, rash.  Reviewed by me and all other systems are negative except as noted in HPI.    PMH: Reviewed. Pertinent positives in HPI.    Review of Patient's Allergies indicates:  No Known Allergies    diclofenac (VOLTAREN) 1 % GEL Gel, Apply 4 g topically 2 (two) times daily as needed, Disp: 100 g, Rfl: 1  Insulin Pen Needle (BD PEN NEEDLE NANO 2ND GEN) 32G X 4 MM MISC, Use as directed, Disp: 30 each, Rfl: 1  losartan (COZAAR) 25 MG tablet, Take 1 tablet by mouth in the morning., Disp: 90 tablet, Rfl: 2  omeprazole (PRILOSEC) 40 MG capsule, Take 1 capsule by mouth in the morning., Disp: 30 capsule, Rfl: 11  acetaminophen (TYLENOL) 500 MG tablet, Take 1,000 mg by mouth, Disp: , Rfl:   meloxicam (MOBIC) 15 MG tablet, Take 1 tablet by mouth in the morning., Disp: 30 tablet, Rfl: 3  phentermine 15 MG capsule, Take 1 capsule by mouth every morning, Disp: 30 capsule, Rfl: 1  DULoxetine (CYMBALTA) 60 MG  capsule, Take 1 capsule by mouth daily Stop taking duloxetine 30mg , Disp: 30 capsule, Rfl: 5  traZODone (DESYREL) 50 MG tablet, Take 1 tablet by mouth nightly, Disp: 90 tablet, Rfl: 3  hydrochlorothiazide (HYDRODIURIL) 25 MG tablet, Take 1 tablet by mouth daily, Disp: 90 tablet, Rfl: 3  meloxicam (MOBIC) 15 MG tablet, Take 1 tablet by mouth daily, Disp: 30 tablet, Rfl: 2    No current facility-administered medications on file prior to visit.        PHYSICAL EXAM:  GENERAL: Alert and oriented, in no acute distress  MOOD: Appropriate.   HENT: NCAT, sclera anicteric, EOMI.  RESP: Breathing unlabored.  MUSCULOSKELETAL:   Limps when walking.        ASSESSMENT/PLAN: 60 year old female with OA of left knee that is not responding to conservative management and is causing her pain that is adversely affecting her QOL.  She completed the TJC with me today.    The patient was educated on the following topics:  - Reasons why surgery is indicated.  - Risks of joint replacement surgery.  - How to prevent infections of the joint.  - Signs and symptoms of a joint infection and what to do.  - How blood clots are prevented.  -  The patient will have a scar at the surgical site.  - Review of the pre-operative requirements.  - Medications to stop taking before surgery.  - NPO after midnight.  - New ERAS protocol  - Leave valuables at home.  - Anesthesia options: general, spinal, and blocks  - Safety measures taken in the hospital.  - Signing the consent form.  - Pain control.  - Measures the hospital is taking to keep patients safe from COVID-19.  - Use of the call button.  - Use of nasal cannulas and IV lines.  - Use of incentive spirometer  - Potential for blood transfusion. May refuse for religious or other reasons.  - Availability of interpreter services  - Involvement of case management.  - Post-operative physical therapy while in the hospital and after discharge.  - Occupational therapy.  - Equipment to buy prior to surgery.  -  Information on the [facility] [employer] [facility].  - Patient will need to set up transportation to their outpatient follow up appointments and physical therapy appointments.    The patient's questions were answered.  The patient was encouraged to call the surgeon's office if he/she/zhe has any further questions.      Total Joint Selection Criteria:  __ Dental Clearance  __ Medical (PCP) clearance  _x_ Total Joint Class (06/26/2021)  __ PT evaluation  __ DME obtained - I provided her with a flyer for the [facility]   _x_ BMI < 40  _x_ Non-smoker/Smoking cessation  _x_ HbA1c < 8.0 = not diabetic  __ COVID NP swab  __ Nasal swab  __ If (+) Mupirocin Rx given  __ CHG wipes given  __ Attended PAT  __ Case management screen  __ Psychosocial issues discussed  __ No current substance abuse         We reviewed the likely diagnosis, the prognosis, and various treatment options in detail. Risks and benefits of treatment plan discussed. The patient's questions have been answered, and the patient understands and agrees with treatment plan.     Patient was seen independently today in a PA clinic.       Janetta Hora, PA-C, 06/26/2021

## 2021-06-26 NOTE — Progress Notes (Signed)
Subjective:    She felt no worse nor no better following her last session, still having immense pain with standing and walking.     Objective:       06/26/21 0700   Language Information   Language of Care Portuguese   Interpreter Yes   Name/ID [place]   Rehab Discipline   Rehab Discipline PT   Visit   Visit number 3   POC Due date 11/11   Time Calculation   Start Time 0732   Stop Time 0759   Time Calculation (min) 27 min   Manual Therapy   Technique patellar mobs, grade II   Time in minutes 4'   Manual Therapy 2   Technique distal quad, proximal calf STM   Time in minutes 10'   Ther Exercise   Exercise SAQ   Reps 10   Sets 2   Holds hold 5'   Ther Exercise 2   Exercise HS curls with stability ball   Reps 2 20   Sets 2 2   Ther Exercise 3   Exercise 3 foot dange   Holds 3 5#, x5 min   Patient Education   What was taught? PT POC,. HEP   Method Written;Practice;Demo;Verbal   Patient comprehension Yes       Assessment:    Pt demo dec tonicity of proximal calf following manual interventions. She demo good AAROM for knee flexion with use of stability ball, no pain throughout tx, but still has much difficulty weight bearing following the session.    Plan:    Cont per tol, proximal strengthening, resisted LAQ.    Jeani Sow, [place], Lic # [id number]

## 2021-07-03 ENCOUNTER — Other Ambulatory Visit: Payer: Self-pay

## 2021-07-03 ENCOUNTER — Ambulatory Visit
Payer: No Typology Code available for payment source | Attending: Orthopaedic Surgery | Admitting: Rehabilitative and Restorative Service Providers"

## 2021-07-03 DIAGNOSIS — M25562 Pain in left knee: Secondary | ICD-10-CM | POA: Insufficient documentation

## 2021-07-03 DIAGNOSIS — M1712 Unilateral primary osteoarthritis, left knee: Secondary | ICD-10-CM | POA: Insufficient documentation

## 2021-07-03 DIAGNOSIS — G8929 Other chronic pain: Secondary | ICD-10-CM

## 2021-07-03 NOTE — Progress Notes (Signed)
Subjective:    She has been feeling well, doing her exercises and she gets relief from taking her medication.    Objective:       07/03/21 0700   Language Information   Language of Care Portuguese   Interpreter Yes   Rehab Discipline   Rehab Discipline PT   Visit   Visit number 4   POC Due date 11/11   Time Calculation   Start Time [phone]   Stop Time 0800   Time Calculation (min) 24 min   Ther Exercise   Exercise Standing hip abduction, and extension   Reps 10   Sets 2   Holds RTB around knees   Ther Exercise 2   Exercise STanding marching   Reps 2 10   Sets 2 2   Holds 2 RTB around knees   Patient Education   What was taught? PT POC,. HEP   Method Written;Practice;Demo;Verbal   Patient comprehension Yes       Assessment:    Pt demo inc tol for exercises and was able to perform standing exercises with no sway and no inc pain following tx.    Plan:    Cont per tol, discussion on balance and how to progress prior to upcoming surgery.     Jeani Sow, [place], Lic # [id number]

## 2021-07-06 ENCOUNTER — Ambulatory Visit (HOSPITAL_BASED_OUTPATIENT_CLINIC_OR_DEPARTMENT_OTHER): Payer: No Typology Code available for payment source | Admitting: Rehabilitative and Restorative Service Providers"

## 2021-07-20 ENCOUNTER — Other Ambulatory Visit (HOSPITAL_BASED_OUTPATIENT_CLINIC_OR_DEPARTMENT_OTHER): Payer: Self-pay | Admitting: Family Medicine

## 2021-07-20 MED FILL — OMEPRAZOLE 40MG: 30 days supply | Qty: 30 | Fill #2

## 2021-07-21 NOTE — Telephone Encounter (Signed)
PER Pharmacy, Stephanie [name] is a 60 year old female has requested a refill of  - DULoxetine     Last Office Visit: 05/11/21 with [name]    Last Physical Exam: 02/21/13  PAP SMEAR due on 07/19/2021  HPV SCREENING due on 07/19/2021  Other Med Adult:  Most Recent BP Reading(s)  06/13/21 : 120/82        Cholesterol (mg/dL)   Date Value   05/14/2020 238     LOW DENSITY LIPOPROTEIN DIRECT (mg/dL)   Date Value   05/14/2020 127     HIGH DENSITY LIPOPROTEIN (mg/dL)   Date Value   05/14/2020 73     TRIGLYCERIDES (mg/dL)   Date Value   05/14/2020 204 (H)         THYROID SCREEN TSH REFLEX FT4 (uIU/mL)   Date Value   05/14/2020 0.840         TSH (THYROID STIM HORMONE) (uIU/mL)   Date Value   12/10/2016 0.790       HEMOGLOBIN A1C (%)   Date Value   04/17/2020 6.2 (H)       No results found for: POCA1C      INR (no units)   Date Value   04/22/2008 1.0 (L)   02/07/2005 1.0 (L)       SODIUM (mmol/L)   Date Value   05/11/2021 141       POTASSIUM (mmol/L)   Date Value   05/11/2021 4.4           CREATININE (mg/dL)   Date Value   05/11/2021 0.6     Documented patient preferred pharmacies:    [facility], [place] - [address]  Phone: [phone] Fax: [phone]

## 2021-07-22 ENCOUNTER — Ambulatory Visit: Payer: No Typology Code available for payment source | Admitting: Rehabilitative and Restorative Service Providers"

## 2021-07-22 DIAGNOSIS — G8929 Other chronic pain: Secondary | ICD-10-CM

## 2021-07-22 MED FILL — DULOXETINE 60MG: 30 days supply | Qty: 30 | Fill #0

## 2021-07-22 NOTE — Progress Notes (Signed)
Pt presents to Physical Therapy for pre-operative appointment, with left total knee replacement scheduled for 09/09/21 with Dr. Madelaine Etienne.    Pt has attended the Total Joint Education Class.    Pt lives alone, and will have her friends present at time of discharge to assist with their care.     Pt has no stairs at home. Pt uses a car for transportation / she will have friends help her drive her to her appointments after surgery.    Pt ambulates without an AD.    Pt was instructed in correct use of RW and cane for post-operative functional mobility. Pt has no adaptive equipment at home. Pt advised to obtain RW, cane and raised toilet seat to facilitate safe return to home post-operatively. Patient was given an informational handout for acquiring recommended DME.    Pt was educated on post-operative precautions and expectations, including the importance of Physical Therapy participation. Pt was provided with a pre-operative home exercise program to optimize functional mobility and rehab outcomes.     All questions and concerns were addressed today. Pt was encouraged to contact the rehab department at [phone] with anything further.       [place], PT, Lic # [id number]

## 2021-08-01 MED FILL — AMOXICILLIN 500MG: 7 days supply | Qty: 28 | Fill #0

## 2021-08-03 ENCOUNTER — Ambulatory Visit: Payer: No Typology Code available for payment source | Attending: Family Medicine | Admitting: Family Medicine

## 2021-08-03 ENCOUNTER — Encounter (HOSPITAL_BASED_OUTPATIENT_CLINIC_OR_DEPARTMENT_OTHER): Payer: Self-pay | Admitting: Family Medicine

## 2021-08-03 ENCOUNTER — Other Ambulatory Visit (HOSPITAL_BASED_OUTPATIENT_CLINIC_OR_DEPARTMENT_OTHER): Payer: No Typology Code available for payment source | Admitting: Dermatology

## 2021-08-03 ENCOUNTER — Other Ambulatory Visit: Payer: Self-pay

## 2021-08-03 VITALS — BP 120/78 | HR 101 | Temp 97.4°F | Ht 59.5 in | Wt 160.4 lb

## 2021-08-03 DIAGNOSIS — M25562 Pain in left knee: Secondary | ICD-10-CM | POA: Diagnosis present

## 2021-08-03 DIAGNOSIS — L720 Epidermal cyst: Secondary | ICD-10-CM | POA: Diagnosis present

## 2021-08-03 DIAGNOSIS — G8929 Other chronic pain: Secondary | ICD-10-CM | POA: Insufficient documentation

## 2021-08-03 DIAGNOSIS — L609 Nail disorder, unspecified: Secondary | ICD-10-CM | POA: Insufficient documentation

## 2021-08-03 DIAGNOSIS — R55 Syncope and collapse: Secondary | ICD-10-CM | POA: Diagnosis present

## 2021-08-03 DIAGNOSIS — Z23 Encounter for immunization: Secondary | ICD-10-CM | POA: Insufficient documentation

## 2021-08-03 DIAGNOSIS — M1712 Unilateral primary osteoarthritis, left knee: Secondary | ICD-10-CM | POA: Diagnosis present

## 2021-08-03 DIAGNOSIS — D485 Neoplasm of uncertain behavior of skin: Secondary | ICD-10-CM

## 2021-08-03 LAB — CBC WITH PLATELET
ABSOLUTE NRBC COUNT: 0 10*3/uL (ref 0.0–0.0)
HEMATOCRIT: 36.1 % (ref 34.1–44.9)
HEMOGLOBIN: 12.2 g/dL (ref 11.2–15.7)
MEAN CORP HGB CONC: 33.8 g/dL (ref 31.0–37.0)
MEAN CORPUSCULAR HGB: 31.8 pg (ref 26.0–34.0)
MEAN CORPUSCULAR VOL: 94 fl (ref 80.0–100.0)
MEAN PLATELET VOLUME: 10.6 fL (ref 8.7–12.5)
NRBC %: 0 % (ref 0.0–0.0)
PLATELET COUNT: 300 10*3/uL (ref 150–400)
RBC DISTRIBUTION WIDTH STD DEV: 47.5 fL — ABNORMAL HIGH (ref 35.1–46.3)
RED BLOOD CELL COUNT: 3.84 M/uL — ABNORMAL LOW (ref 3.90–5.20)
WHITE BLOOD CELL COUNT: 7 10*3/uL (ref 4.0–11.0)

## 2021-08-03 LAB — COMPREHENSIVE METABOLIC PANEL
ALANINE AMINOTRANSFERASE: 34 U/L (ref 12–45)
ALBUMIN: 4.1 g/dL (ref 3.4–5.2)
ALKALINE PHOSPHATASE: 86 U/L (ref 45–117)
ANION GAP: 11 mmol/L (ref 10–22)
ASPARTATE AMINOTRANSFERASE: 28 U/L (ref 8–34)
BILIRUBIN TOTAL: 0.2 mg/dL (ref 0.2–1.0)
BUN (UREA NITROGEN): 16 mg/dL (ref 7–18)
CALCIUM: 9.8 mg/dL (ref 8.5–10.5)
CARBON DIOXIDE: 29 mmol/L (ref 21–32)
CHLORIDE: 99 mmol/L (ref 98–107)
CREATININE: 0.6 mg/dL (ref 0.4–1.2)
ESTIMATED GLOMERULAR FILT RATE: >60 mL/min (ref >60)
Glucose Random: 117 mg/dL (ref 74–160)
POTASSIUM: 3.6 mmol/L (ref 3.5–5.1)
SODIUM: 139 mmol/L (ref 136–145)
TOTAL PROTEIN: 6.9 g/dL (ref 6.4–8.2)

## 2021-08-03 LAB — RBC SEDIMENTATION RATE: RBC SEDIMENTATION RATE: 34 MM/HR — ABNORMAL HIGH (ref 0–30)

## 2021-08-03 LAB — THYROID SCREEN TSH REFLEX FT4: THYROID SCREEN TSH REFLEX FT4: 1.46 u[IU]/mL (ref 0.270–4.200)

## 2021-08-03 LAB — APTT: APTT: 28.2 SECONDS (ref 26.9–38.0)

## 2021-08-03 LAB — PROTHROMBIN TIME
INR: 1.1 (ref 2.0–3.5)
PROTHROMBIN TIME: 11.6 SECONDS (ref 9.6–12.3)

## 2021-08-03 MED ORDER — DULOXETINE HCL 60 MG PO CPEP
60.0000 mg | ORAL_CAPSULE | Freq: Every day | ORAL | 5 refills | Status: DC
Start: 2021-08-03 — End: 2021-11-20

## 2021-08-03 MED ORDER — IBUPROFEN 600 MG PO TABS
600.0000 mg | ORAL_TABLET | Freq: Two times a day (BID) | ORAL | 2 refills | Status: DC | PRN
Start: 2021-08-03 — End: 2021-10-14

## 2021-08-03 MED ORDER — MELOXICAM 15 MG PO TABS
15.0000 mg | ORAL_TABLET | Freq: Every day | ORAL | 3 refills | Status: DC
Start: 2021-08-03 — End: 2021-08-03

## 2021-08-03 MED FILL — MELOXICAM 15MG: 30 days supply | Qty: 30 | Fill #0

## 2021-08-03 MED FILL — IBUPROFEN 600MG: 50 days supply | Qty: 100 | Fill #0

## 2021-08-03 NOTE — Progress Notes (Addendum)
BP 120/78    Pulse 101    Temp 97.4 F (36.3 C) (Temporal)    Ht 4' 11.5" (1.511 m)    Wt 72.8 kg (160 lb 6.4 oz)    LMP 07/11/2005    SpO2 96%    BMI 31.85 kg/m     S:    Stephanie [name] is a 60 year old female who presents with:    Knee injection done    Having knee surgery     Also sometimes palps     Feels like has something in her belly   And diarrhea  Gets bloated     No infections before surgery   6 months -       Past Medical History:  02/12/2016: Chronic right shoulder pain  No date: Depression  03/20/2012: Diverticulosis of colon      Comment:  Noted on colonoscopy 6/13   08/16/2010: Elevated hemoglobin A1c      Comment:  HEMOGLOBIN A1C (%) Date Value 03/06/2015 5.6                05/20/2014 5.4 05/08/2012 6.0 (H)   No results found for:               POCA1C   11/21/2012: Hyperopia with astigmatism and presbyopia  No date: Hypertension  11/21/2012: Immature cataract  01/21/2012: Impingement syndrome of right shoulder      Comment:  10/14: pt taking  [place] med (meloxicam 7.5 mg BID                and cyclobenzaprine); Will ask for refill once supply                finished; PCP ok with refill of flexeril on temporary                basis;   01/26/2008: Irritable bladder      Comment:  Seen by Dr Jeraldine Loots 5/09, no incontinence, likely                irritable bladder.  Trial of anticholinergic, refer to                urology, has appt Dr Minna Antis 02/23/08  02/16/2005: Leiomyoma of uterus, unspecified      Comment:  Dr Jeraldine Loots hysterectomy November, 2006; cervix retained,               last pap 11/11 neg Repeat pap/HPV 07/2015   02/15/2014: Mass of hand  07/30/2011: Other plastic surgery for unacceptable cosmetic appearance      Comment:  Breast augmentation scheduled 08/02/11;  Dr.  Ancil Boozer Phone#: [phone]     01/08/2016: Schistosomiasis  08/13/2016: Squamous 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   eyes  06/18/2004: Urinary calculus, unspecified    SH and FH reviewed and updated as  needed.    ROS: Per HPI. No reported headaches, weakness/falls, chest pain, shortness of breath, abdominal pain/nausea/vomiting, numbness/tingling, fevers or chills.     Medications, allergies, PMH and SH reviewed and updated as necessary in Epic.    O:  PHYSICAL EXAM:  BP 120/78    Pulse 101    Temp 97.4 F (36.3 C) (Temporal)    Ht 4' 11.5" (1.511 m)    Wt 72.8 kg (160 lb 6.4 oz)    LMP 07/11/2005    SpO2 96%  BMI 31.85 kg/m   GEN: NAD  HEENT: MMM  SKIN: WWP  CHEST: Breathing comfortably, more out of breath when getting up and down from table  CV: RRR  NEURO: No focal deficits noted, alert  MSK: Normal gait observed  PSYCH: Appears stated age, appropriate grooming and dress, appropriate affect and mood congruent    EKG (my read): rate 83, rhythm sinus, axis normal, intervals increased QTc, no ST-T changes, noLVH      A/P:  Stephanie [name] is a 60 year old female presents with:    1. Need for prophylactic vaccination and inoculation against influenza  - IMMUNIZATION ADMIN SINGLE  - IIV4 VACC PRESERV FREE AGE 44 MONTHS AND OLDER, 0.5ML, IM    2. Epidermal inclusion cyst  Reviewed coming back   Path confirmed  Next steps?   - REFERRAL TO E-CONSULTATION TELEDERMATOLOGY    3. Primary osteoarthritis of left knee  Reviewed upcoming surgery   Getting basic labs   Would like to eval heart with holter given tachy and some dizziness  - CBC WITH PLATELET  - COMPREHENSIVE METABOLIC PANEL  - PROTHROMBIN TIME  - APTT  - RBC SEDIMENTATION RATE  - THYROID SCREEN TSH REFLEX FT4    4. Chronic pain of left knee  Reviewed not both meds  - ibuprofen (ADVIL) 600 MG tablet; Take 1 tablet by mouth 2 (two) times daily as needed for Pain  Dispense: 100 tablet; Refill: 2    5. Nail abnormalities  Tried to find nail varnish we are using    6. Syncope and collapse  Mentioned presyncopal episode recently   Does feel like has been more short of breath and having palpitations  - EXTENDED HOLTER MONITORING; Future  - EKG  - CBC WITH PLATELET  -  COMPREHENSIVE METABOLIC PANEL  - PROTHROMBIN TIME  - APTT  - RBC SEDIMENTATION RATE  - THYROID SCREEN TSH REFLEX FT4      We discussed the patients current medications. The patient expressed understanding and no barriers to adherence were identified.    F/u as needed    Health Maintenance:  Reviewed HM    Lemar Livings, MD

## 2021-08-03 NOTE — Progress Notes (Signed)
08/03/2021  VIS given prior to administration and reviewed with the patient and or legal guardian. Patient understands the disease and the vaccine. See immunization/Injection module or chart review for date of publication and additional information.  Alandis Bluemel Menezes-Odell, RN

## 2021-08-04 LAB — EKG

## 2021-08-04 NOTE — e-consult (Signed)
Thank you for referring your patient to me with use of the teledermatology system. I am basing my recommendations upon the information provided by the referring physician, including images, and upon review of the problem list, medications, and allergies as documented in the electronic medical record. I have not had the benefit of personally interviewing or physically examining the patient.        Consultation request:  Removed an epidermal cyst from the face with gen surg and now it is coming back. See media pic for image. Any thoughts on next management?      Image interpretation:  - On the left temple there appears to be a subcutaneous nodule without significant epidermal changes     Assessment:  It appears that this was excised by plastic surgery, Dr. Nile Dear, in Feb 2022, pathology showed an epidermal inclusion cyst. Most likely it has regrown- this is a common ocurrence if any component of the cyst lining was not completely removed with the prior excision. Otherwise, I cannot determine a diagnosis for this lesion unless a biopsy or excision is done.      Recommendations:  - I would refer the patient back to plastic surgery for a second excision. If the patient wants a second opinion or another provider please let me know and we can try to refer her to [facility] dermatology surgery.         Time spent on this e-Consultation :  >28min     Please let me know whether I can be of further assistance and please let me know how the patient responds to your management.  Sincerely,    Harolyn Rutherford, MD  [facility] Dermatology

## 2021-08-10 ENCOUNTER — Encounter (HOSPITAL_BASED_OUTPATIENT_CLINIC_OR_DEPARTMENT_OTHER): Payer: Self-pay | Admitting: Family Medicine

## 2021-08-17 ENCOUNTER — Other Ambulatory Visit: Payer: Self-pay

## 2021-08-17 ENCOUNTER — Other Ambulatory Visit (HOSPITAL_BASED_OUTPATIENT_CLINIC_OR_DEPARTMENT_OTHER): Payer: Self-pay | Admitting: Internal Medicine

## 2021-08-17 ENCOUNTER — Ambulatory Visit (HOSPITAL_BASED_OUTPATIENT_CLINIC_OR_DEPARTMENT_OTHER): Payer: No Typology Code available for payment source | Admitting: Family Medicine

## 2021-08-17 ENCOUNTER — Encounter (HOSPITAL_BASED_OUTPATIENT_CLINIC_OR_DEPARTMENT_OTHER): Payer: Self-pay | Admitting: Family Medicine

## 2021-08-17 ENCOUNTER — Ambulatory Visit
Admission: RE | Admit: 2021-08-17 | Discharge: 2021-08-17 | Disposition: A | Discharge status: Home / Self Care | Payer: No Typology Code available for payment source | Attending: Family Medicine | Admitting: Family Medicine

## 2021-08-17 ENCOUNTER — Ambulatory Visit: Payer: No Typology Code available for payment source | Attending: Orthopaedic Surgery | Admitting: Orthopaedic Surgery

## 2021-08-17 VITALS — Temp 97.8°F | Ht 59.5 in | Wt 160.0 lb

## 2021-08-17 DIAGNOSIS — R55 Syncope and collapse: Secondary | ICD-10-CM | POA: Diagnosis present

## 2021-08-17 DIAGNOSIS — Z124 Encounter for screening for malignant neoplasm of cervix: Secondary | ICD-10-CM | POA: Insufficient documentation

## 2021-08-17 DIAGNOSIS — M1712 Unilateral primary osteoarthritis, left knee: Secondary | ICD-10-CM | POA: Insufficient documentation

## 2021-08-17 DIAGNOSIS — R9431 Abnormal electrocardiogram [ECG] [EKG]: Secondary | ICD-10-CM

## 2021-08-17 LAB — URINALYSIS
BILIRUBIN, URINE: NEGATIVE
GLUCOSE, URINE: NEGATIVE MG/DL
KETONE, URINE: NEGATIVE MG/DL
LEUKOCYTE ESTERASE: NEGATIVE
NITRITE, URINE: NEGATIVE
OCCULT BLOOD, URINE: NEGATIVE
PH URINE: 7 (ref 5.0–8.0)
PROTEIN, URINE: NEGATIVE MG/DL
SPECIFIC GRAVITY URINE: 1.015 (ref 1.003–1.035)

## 2021-08-17 NOTE — Progress Notes (Signed)
Cane given to the patient by Rosina Lowenstein, RN, under the direction of Dr. Madelaine Etienne. I adjusted it and provided complete instructions on safe use.  Patient expressed understanding. Patient was made aware that they may receive a copay for the cane if their insurance does not cover all of it.

## 2021-08-17 NOTE — Progress Notes (Addendum)
Orthopedic Office Note    CC: Patient presents with:  Follow Up: Left knee    ORTHOPEDIC PROBLEM LIST:  1. Left knee osteoarthritis   2.  Left knee pain--chronic    HPI: Stephanie [name] is a 60 year old female who presents today for f/u of left knee pain due to knee OA. She is undergoing requirements for total knee replacement.  She attended the joint class.  She was planning to have her surgery in January but now thinks she would prefer early February as her daughter will be coming from [place] then.    She also has been having some dizziness and palpitations.  She states the dizziness does not correspond to the feeling of palpitations, but rather occurs when she stands up.  She saw Dr. Katy Apo who ordered a holter monitor.  Pt not sure when to get this.  It was ordered two weeks ago but she has not received a phone call to schedule.    Pt tried a variety of non operative treatments including HA injections,PRP, Umbilical cord injections, ozone injections as well as PT and therapeutic laser. These did not work and she would like to have a total knee replacement.    The pain is now present all the time. She localizes the pain mostly medially over the knee. No locking or buckling. Feels crunching in the knee. Walking is very painful.  She works as a Electrical engineer. Denies any numbness or tingling distally. She was taking meloxicam and now taking ibuprofen.  Ibuprofen isn't helping that much.    [place] speaking telephone interpreter was used for this visit.     ROS: Review of systems filled out by patient and scanned into the medical record. Reviewed by me and all other systems are negative except as noted in HPI.    PMH: Past Medical History:  02/12/2016: Chronic right shoulder pain  No date: Depression  03/20/2012: Diverticulosis of colon      Comment:  Noted on colonoscopy 6/13   08/16/2010: Elevated hemoglobin A1c      Comment:  HEMOGLOBIN A1C (%) Date Value 03/06/2015 5.6                05/20/2014 5.4 05/08/2012 6.0  (H)   No results found for:               POCA1C   11/21/2012: Hyperopia with astigmatism and presbyopia  No date: Hypertension  11/21/2012: Immature cataract  01/21/2012: Impingement syndrome of right shoulder      Comment:  10/14: pt taking  [place] med (meloxicam 7.5 mg BID                and cyclobenzaprine); Will ask for refill once supply                finished; PCP ok with refill of flexeril on temporary                basis;   01/26/2008: Irritable bladder      Comment:  Seen by Dr Jeraldine Loots 5/09, no incontinence, likely                irritable bladder.  Trial of anticholinergic, refer to                urology, has appt Dr Minna Antis 02/23/08  02/16/2005: Leiomyoma of uterus, unspecified      Comment:  Dr Jeraldine Loots hysterectomy November, 2006; cervix retained,  last pap 11/11 neg Repeat pap/HPV 07/2015   02/15/2014: Mass of hand  07/30/2011: Other plastic surgery for unacceptable cosmetic appearance      Comment:  Breast augmentation scheduled 08/02/11;  Dr.  Ancil Boozer Phone#: [phone]     01/08/2016: Schistosomiasis  08/13/2016: Squamous 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   eyes  06/18/2004: Urinary calculus, unspecified    FH:  Review of patient's family history indicates:  Problem: Cancer - Other      Relation: Father          Age of Onset: (Not Specified)          Comment: stomach  Problem: Heart      Relation: Brother          Age of Onset: (Not Specified)          Comment: died at 8 during valve replacement  Problem: Heart      Relation: Brother          Age of Onset: (Not Specified)          Comment: chestpain with neg w/u  Problem: Heart      Relation: Mother          Age of Onset: (Not Specified)          Comment: Died of MI age 54  Problem: Diabetes      Relation: Mother          Age of Onset: (Not Specified)    Surgical HX: Past Surgical History:  No date: BREAST ENHANCEMENT SURGERY      Comment:  12/12 had bilateral mammoplasty and saline implants, had                2nd surgery 5/30 for revision  No date: MAMMAPLASTY AUGMENTATION W/PROSTHETIC IMPLANT  No date: MASTOPEXY  No date: OB ANTEPARTUM CARE CESAREAN DLVR & POSTPARTUM      Comment:  x3  No date: REDUCTION MAMMAPLASTY  No date: TOTAL ABDOMINAL HYSTERECT W/WO RMVL TUBE OVARY      Comment:  for fibroids, still has cervix    SH:   Social History     Socioeconomic History    Marital status: Divorced     Spouse name: Not on file    Number of children: Not on file    Years of education: Not on file    Highest education level: Not on file   Occupational History    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; 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   Tobacco Use    Smoking status: Former     Types: Cigarettes     Quit date: 07/27/2001     Years since quitting: 20.0    Smokeless tobacco: Never   Substance and Sexual Activity    Alcohol use: Yes     Alcohol/week: 6.0 standard drinks     Types: 6 Cans of beer per week     Comment: 6 beers per week     Drug use: No    Sexual activity: Not Currently     Partners: Male     Comment: G3P3, no hx STD, no hx abn Pap   Other Topics Concern    Not on file   Social History Narrative    08/03/19 Divorced, has 3 children, lives alone, works as a Education administrator lady    Social Determinants of Librarian, academic  Strain: Not on file  Food Insecurity: Not on file  Transportation Needs: Not on file  Physical Activity: Not on file  Stress: Not on file  Social Connections: Not on file  Intimate Partner Violence: Not on file  Housing Stability: Not on file    Allergies: Review of Patient's Allergies indicates:  No Known Allergies    Current Medications:   Current Outpatient Medications:     DULoxetine (CYMBALTA) 60 MG capsule, Take 1 capsule by mouth in the morning. Stop taking duloxetine 30mg ., Disp: 30 capsule, Rfl: 5    ibuprofen (ADVIL) 600 MG tablet, Take 1 tablet by mouth 2 (two) times daily as needed for Pain, Disp: 100 tablet, Rfl: 2    losartan (COZAAR) 25 MG tablet, Take 1 tablet by mouth in  the morning., Disp: 90 tablet, Rfl: 2    omeprazole (PRILOSEC) 40 MG capsule, Take 1 capsule by mouth in the morning., Disp: 30 capsule, Rfl: 11    acetaminophen (TYLENOL) 500 MG tablet, Take 1,000 mg by mouth, Disp: , Rfl:     traZODone (DESYREL) 50 MG tablet, Take 1 tablet by mouth nightly, Disp: 90 tablet, Rfl: 3    IMAGING: x-ray of the left knee today shows degenerative changes with severe medial compartment narrowing. Imaging was reviewed during this visit.     PHYSICAL EXAM:  GENERAL: Alert and oriented, in no acute distress  MOOD: Appropriate.   MUSCULOSKELETAL: Examination of the left knee shows slight varus deformity. Overlying skin is warm, dry, and intact. There is no erythema, edema, or ecchymosis. There is medial joint line tenderness, no lateral joint line tenderness. There is crepitus over the patella with active flexion and extension of the knee. Active ROM 5-120 degrees. Ligaments are stable to valgus and varus stress.. Calf is soft and nontender. Neurovascularly intact distally.   Gait:  Extremely antalgic.  Can barely put weight on left leg.    ASSESSMENT/PLAN: 61 year old female who presents today for evaluation of left knee pain. She has severe osteoarthritis with bone on bone medially. Treatment options were discussed. She has tried several different types of injections in the knee in the past, all with minimal or no relief.     Pt needs holter monitor.  I called the CV lab and they have one available and will see her today to provide this.    She had an econsult with Dr. 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    She has dentures but has some implants.  She has been seeing the dentist and has a procedure to put some cement around a tooth or something Jan 5.    Daughter is coming from [place] in February arriving on the 3rd..    Surgery was scheduled for Jan 11.  I am now booking in  April, but we may be able to get more OR time in February.  Today with telephone interpreter obtained consent for surgery after discussing risks and benefits.    Pt will need pre op appt which was intended to be today but now we do not have a new surgery date.  She will need nasal swab and to receive wipes and ERAS drink.  She will also  Need a new PAT appt as she did not keep the one previously scheduled.  She will also need to move her PT appt.  Her insurance won't cover PT  at her home.  I will email the PT.    Deeann Saint, MD, 08/17/2021     Addendum:  Called CV lab and scheduled echo for Jan 10.      Deeann Saint, MD, 08/21/2021          Nursing Communication:  None                 ___ MRSA/MSSA nasal swab ( neg)  __x_ HbA1C < 8.0  _x_ BMI < 40.0  _x_ Not Smoking / Quit Smoking  __x_ Psychosocial issues discussed  _x__ No current substance abuse  _x_ Joint class attended  _x_ PT evaluation done  ___ Medical Clearance  __ Dental Clearance--has full dentures.  Some are implants and she just had them cleaned 2 weeks ago--treatment will be done Jan 5  __ If (+) Mupirocin Rx given  ___ CHG wipes given  ___ Attended PAT  ___ Case management screen

## 2021-08-17 NOTE — Progress Notes (Signed)
Temp 97.8 F (36.6 C) (Temporal)    Ht 4' 11.5" (1.511 m)    Wt 72.6 kg (160 lb)    LMP 07/11/2005    BMI 31.78 kg/m     S:    Stephanie [name] is a 60 year old female who presents with:    Dizzy   Still   Coming back to eval   No sign     The 10-year ASCVD risk score (Arnett DK, et al., 2019) is: 3.7%    Values used to calculate the score:      Age: 66 years      Sex: Female      Is Non-Hispanic African American: No      Diabetic: No      Tobacco smoker: No      Systolic Blood Pressure: 371 mmHg      Is BP treated: Yes      HDL Cholesterol: 73 mg/dL      Total Cholesterol: 238 mg/dL      Past Medical History:  02/12/2016: Chronic right shoulder pain  No date: Depression  03/20/2012: Diverticulosis of colon      Comment:  Noted on colonoscopy 6/13   08/16/2010: Elevated hemoglobin A1c      Comment:  HEMOGLOBIN A1C (%) Date Value 03/06/2015 5.6                05/20/2014 5.4 05/08/2012 6.0 (H)   No results found for:               POCA1C   11/21/2012: Hyperopia with astigmatism and presbyopia  No date: Hypertension  11/21/2012: Immature cataract  01/21/2012: Impingement syndrome of right shoulder      Comment:  10/14: pt taking  [place] med (meloxicam 7.5 mg BID                and cyclobenzaprine); Will ask for refill once supply                finished; PCP ok with refill of flexeril on temporary                basis;   01/26/2008: Irritable bladder      Comment:  Seen by Dr Jeraldine Loots 5/09, no incontinence, likely                irritable bladder.  Trial of anticholinergic, refer to                urology, has appt Dr Minna Antis 02/23/08  02/16/2005: Leiomyoma of uterus, unspecified      Comment:  Dr Jeraldine Loots hysterectomy November, 2006; cervix retained,               last pap 11/11 neg Repeat pap/HPV 07/2015   02/15/2014: Mass of hand  07/30/2011: Other plastic surgery for unacceptable cosmetic appearance      Comment:  Breast augmentation scheduled 08/02/11;  Dr.  Ancil Boozer Phone#: [phone]     01/08/2016:  Schistosomiasis  08/13/2016: Squamous 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   eyes  06/18/2004: Urinary calculus, unspecified    SH and FH reviewed and updated as needed.    ROS: Per HPI. No reported headaches, weakness/falls, chest pain, shortness of breath, abdominal pain/nausea/vomiting, numbness/tingling, fevers or chills.     Medications, allergies, PMH and SH reviewed and updated as necessary in Epic.    O:  PHYSICAL EXAM:  Temp 97.8 F (36.6 C) (Temporal)    Ht 4' 11.5" (1.511 m)    Wt 72.6 kg (160 lb)    LMP 07/11/2005    BMI 31.78 kg/m   GEN: NAD  HEENT: MMM  SKIN: WWP  CHEST: Breathing comfortably  NEURO: No focal deficits noted, alert  MSK: Normal gait observed  PSYCH: Appears stated age, appropriate grooming and dress, appropriate affect and mood congruent  PELVIC: Normal female external genitalia. No lesions or ulcers on inspection. No vaginal discharge. Normal-appearing nonpurulent white discharge at cervical os. Pap smear done.     A/P:  Stephanie [name] is a 60 year old female presents with:    1. Primary osteoarthritis of left knee  Reviewed with her - still hasn't had holter - would like this done prior to surgery   Recommend holding off on surgery   Will discuss with her surgeon today     2. Syncope and collapse  Orthostatic - reviewed increased hydration   BP on lower end - will dc HCTZ and fu 2 wks  Has home BP and can have come back in for labs  - URINALYSIS  - REFERRAL TO E-CONSULTATION CARDIOLOGY    3. Pap smear for cervical cancer screening  due  - CYTOPATH, C/V, THIN LAYER  - OBTAINING SCREEN PAP SMEAR  - HUMAN PAPILLOMAVIRUS (HPV)      We discussed the patients current medications. The patient expressed understanding and no barriers to adherence were identified.    F/u 2 wks  as needed    Health Maintenance:  Reviewed HM    Lemar Livings, MD

## 2021-08-17 NOTE — e-consult (Signed)
Department of Cardiology     Thank you for referring your patient for an e-consultation. I have not interviewed or examined the patient. My recommendations here are based on the review of relevant clinical information and information provided by the referring provider.    Consultation request:  60 year old for knee surgery.    Case Summary:  Pt with new abnormal EKG and some dizziness - wanted to get holter and still hasn't been arranged   She is preop for a knee surgery and wanted to have cardiac situation assessed beforehand.   Should we postpone surgery for this? Any idea when might get done?     EKG 08/04/2019 normal sinus rhythm, inferior infarct pattern.    Recommendations:  1.  We are unable to provide preoperative assessment on an E consult basis.  Patient has an abnormal EKG with inferior infarct which could represent pseudo infarct and definitely needs an echocardiogram for confirmation.  Needs cardiology consultation.  Extended Holter monitor was ordered today and we will try to expedite it.      Please let me know whether I can be of further assistance and please let me know how the patient responds to your management.    Sincerely yours,    Teodoro Kil, MD    Patient Disposition : REGULAR FOLLOW UP VISIT NEEDED    Time spent on this e-Consultation :  5-10 minutes, >50% of the total time devoted to medical consultative verbal/ EMR discussion between the providers, written and verbal report will be generated

## 2021-08-18 LAB — HUMAN PAPILLOMAVIRUS (HPV): HUMAN PAPILLOMAVIRUS: NEGATIVE

## 2021-08-19 ENCOUNTER — Ambulatory Visit
Admission: RE | Admit: 2021-08-19 | Discharge: 2021-08-19 | Disposition: A | Discharge status: Home / Self Care | Payer: No Typology Code available for payment source

## 2021-08-19 ENCOUNTER — Other Ambulatory Visit (HOSPITAL_BASED_OUTPATIENT_CLINIC_OR_DEPARTMENT_OTHER): Payer: Self-pay | Admitting: Family Medicine

## 2021-08-19 ENCOUNTER — Ambulatory Visit (HOSPITAL_BASED_OUTPATIENT_CLINIC_OR_DEPARTMENT_OTHER): Admission: RE | Admit: 2021-08-19 | Payer: Self-pay | Source: Ambulatory Visit

## 2021-08-19 DIAGNOSIS — R55 Syncope and collapse: Secondary | ICD-10-CM

## 2021-08-19 NOTE — Discharge Instructions (Signed)
INSTRUES DO PR-OPERATRIO PARA O SURGICAL DAY CARE   SURGICAL DAY CARE PRE-OPERATIVE INSTRUCTIONS    DIA DA CIRURGIA  DAY OF SURGERY    [place] em: {[place] [facility] LOCATIONS:15098} Registro em {DAYS OF WEEK (PORTUGUESE):11392}, {MONTHS (PORTUGUESE):11387} {Numbers;0-31:14510} s (Time): ***.   Arrive at:  Registration on (Day of the Week, Month, Day, at (Time).        obrigatrio ter um adulto responsvel disponvel para acompanh-lo at sua casa aps a cirurgia. (Sugerimos que voc tenha algum disponvel para ajud-lo em casa aps a sua cirurgia).  You must have a responsible adult available to accompany you home after surgery. (We suggest that you have someone available to assist you at home after your surgery).    INSTRUES:   INSTRUCTIONS:  Voc no poder comer ou beber nada aps a meia-noite da noite anterior  cirurgia, nem mesmo gua, goma de mascar ou balas.   You may have nothing to eat or drink after midnight the night before your surgery, not even water, gum or hard candy.    Voc no poder fumar na manh do dia da sua cirurgia.  You may not smoke the morning of your surgery.    Por favor, deixe em casa todos os objetos de valor, incluindo joias, relgios, dinheiro, etc.  Please leave all valuables at home, including jewelry, watches, money, etc.    Remova qualquer esmalte das unhas antes de chegar ao Surgical Day Care e no use qualquer maquiagem ou batom.  Please remove all fingernail polish before arriving at Surgical Day Care and do not  wear any face or lip make-up.    Se for fazer cirurgia ocular, no aplique maquiagem nos olhos ou no rosto e evite hidratantes faciais e perfumes.  If you are having eye surgery, do not wear any eye or face makeup and avoid facial moisturizers and perfumes.    Por favor, retire quaisquer extenses de cabelo que possam ser removidas.  Please take out any hair extensions that can be removed.    No raspe os pelos da rea da cirurgia.  Do not shave surgical site.    No  use lentes de contato.  Do not wear contact lenses.    MEDICAMENTOS:   MEDICATIONS:     Tome a medicao a seguir na noite anterior  cirurgia, na hora de dormir: ***   Take the following medication the night before surgery at bedtime:     Tome a medicao a seguir na manh do dia da sua cirurgia, com apenas um gole de gua: ***  Take the following medication the morning of your surgery with only a sip of water:

## 2021-08-20 NOTE — Anesthesia Preprocedure Evaluation (Addendum)
Pre-Anesthetic Note  .      Patient: Stephanie [name] is a 60 year old female      Procedure Information     Date/Time: 09/09/21 1200    Procedure: ARTHROPLASTY, KNEE, TOTAL (Left)    Diagnosis: Primary osteoarthritis of left knee [M17.12]    Pre-op diagnosis: Primary osteoarthritis of left knee [M17.12]    Location: [facility] / [facility]    Surgeons: Deeann Saint, MD          Relevant Problems   CARDIO   (+) Hypertension, goal below 140/90           Previous Anesthetic History:   Past Surgical History:  No date: BREAST ENHANCEMENT SURGERY      Comment:  12/12 had bilateral mammoplasty and saline implants, had               2nd surgery 5/30 for revision  No date: MAMMAPLASTY AUGMENTATION W/PROSTHETIC IMPLANT  No date: MASTOPEXY  No date: OB ANTEPARTUM CARE CESAREAN DLVR & POSTPARTUM      Comment:  x3  No date: REDUCTION MAMMAPLASTY  No date: TOTAL ABDOMINAL HYSTERECT W/WO RMVL TUBE OVARY      Comment:  for fibroids, still has cervix        Medications  Current Outpatient Medications   Medication Sig    DULoxetine (CYMBALTA) 60 MG capsule Take 1 capsule by mouth in the morning. Stop taking duloxetine 30mg .    ibuprofen (ADVIL) 600 MG tablet Take 1 tablet by mouth 2 (two) times daily as needed for Pain    losartan (COZAAR) 25 MG tablet Take 1 tablet by mouth in the morning.    omeprazole (PRILOSEC) 40 MG capsule Take 1 capsule by mouth in the morning.    acetaminophen (TYLENOL) 500 MG tablet Take 1,000 mg by mouth    traZODone (DESYREL) 50 MG tablet Take 1 tablet by mouth nightly     No current facility-administered medications for this encounter.         Allergies:   Review of Patient's Allergies indicates:  No Known Allergies    Smoking, Alcohol, Drugs:  Social History    Tobacco Use      Smoking status: Former        Types: Cigarettes        Quit date: 07/27/2001        Years since quitting: 20.0      Smokeless tobacco: Never    Alcohol use: Yes      Alcohol/week: 6.0 standard drinks      Types: 6 Cans of beer per  week      Comment: 6 beers per week       Drug use: No       PMHx:  Past Medical History:  02/12/2016: Chronic right shoulder pain  No date: Depression  03/20/2012: Diverticulosis of colon      Comment:  Noted on colonoscopy 6/13   08/16/2010: Elevated hemoglobin A1c      Comment:  HEMOGLOBIN A1C (%) Date Value 03/06/2015 5.6                05/20/2014 5.4 05/08/2012 6.0 (H)   No results found for:               POCA1C   11/21/2012: Hyperopia with astigmatism and presbyopia  No date: Hypertension  11/21/2012: Immature cataract  01/21/2012: Impingement syndrome of right shoulder      Comment:  10/14: pt taking  [place] med (meloxicam 7.5 mg BID                and cyclobenzaprine); Will ask for refill once supply                finished; PCP ok with refill of flexeril on temporary                basis;   01/26/2008: Irritable bladder      Comment:  Seen by Dr Jeraldine Loots 5/09, no incontinence, likely                irritable bladder.  Trial of anticholinergic, refer to                urology, has appt Dr Minna Antis 02/23/08  02/16/2005: Leiomyoma of uterus, unspecified      Comment:  Dr Jeraldine Loots hysterectomy November, 2006; cervix retained,               last pap 11/11 neg Repeat pap/HPV 07/2015   02/15/2014: Mass of hand  07/30/2011: Other plastic surgery for unacceptable cosmetic appearance      Comment:  Breast augmentation scheduled 08/02/11;  Dr.  Ancil Boozer Phone#: [phone]     01/08/2016: Schistosomiasis  08/13/2016: Squamous 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   eyes  06/18/2004: Urinary calculus, unspecified    Vitals  LMP 07/11/2005     Review of Systems     Patient summary reviewed      Anesthetic History:   negative anesthesia history ROS   Negative anesthetic complications patient and family ROS               Cardiovascular: Positive for hypercholesterolemia, hypertension, palpitations and syncope (syncope 07/2021; near syncope aroudn 04/2021). Negative for chest pain, CAD/MI, acute myocardial  infarction and shortness of breath.        ECHO 09/08/21  1. Left Ventricle: Global systolic function: Left ventricular ejection fraction by Biplane Simpson's is 68%.   2. Left Ventricle: Regional systolic function: Wall motion: There are no regional wall motion abnormalities.   3. Left Ventricle: Diastolic function: There is normal diastolic function.   4. Aortic Valve: Trileaflet. There is focal calcification at the base of the non coronary cusp.   5. Tricuspid Valve: There is insufficient regurgitation to estimate RVSP.      NM stress 09/16/21  IMPRESSION:   Normal study. No evidence of stress-induced ischemia.       Extended monitor 08/07/21 placed  Conclusion:  ? Monitored for 3 days and 3 hours.  ? Sinus rhythm with average heart rate of 84 bpm (range 59-123 bpm)  ? Isolated scattered PACs.  Rare PVCs.  ? No symptoms reported.  No arrhythmia.  No atrial fibrillation.        EKG 08/03/21  Test Reason : SYNCOPE     Vent. Rate : 083 BPM   Atrial Rate : 083 BPM     P-R Int : 152 ms     QRS Dur : 086 ms      QT Int : 390 ms    P-R-T Axes : 022 -02 013 degrees     QTc Int : 458 ms        Normal sinus rhythm    Inferior infarct , age undetermined    Abnormal ECG    When compared with ECG of 25-Apr-2020 11:36,  Inferior infarct is now present      EKG 04/25/20 NSR, normal EKG     Cardiology econsult [place] 08/17/21  Recommendations:  1.  We are unable to provide preoperative assessment on an E consult basis.  Patient has an abnormal EKG with inferior infarct which could represent pseudo infarct and definitely needs an echocardiogram for confirmation.  Needs cardiology consultation.  Extended Holter monitor was ordered today and we will try to expedite it.    PCP note [facility] [place] 08/03/21  6. Syncope and collapse  Mentioned presyncopal episode recently   Does feel like has been more short of breath and having palpitations  - EXTENDED HOLTER MONITORING; Future  - EKG    cardilogy eval Dr Pincus Badder  08/28/21  Assessment and Plan:  1. Preoperative cardiac risk stratification, Patient needs cardiac clearance for left knee arthroplasty.   2. Dyspnea on exertion with moderate level of activities. Likely multifactorial including obesity, deconditioning. Need to rule out significant cardiomyopathy/valvular disease/ CAD  3. Family history of premature CAD  4. Former tobacco abuse  5. Obesity  6. Essential HTN, controlled.   7. Dizziness, mostly when standing up.   Plan  Results of EKG were discussed with patient   Will order echocardiogram to rule out cardiomyopathy / valvular disease.   Will order an urgent pharmacologic nuclear stress test to rule out significant CAD  Will order a 3 day holter monitor to rule out significant cardiac arrhythmias  Continue current cardiovascular medications including Losartan  Will review stress test pending clearance and communicate with primary team/surgeon    Cardiology message 10/06/21 Dr Pincus Badder -Tamayo  Mochizuki-Tamayo, Humberto   7:11 AM (33 minutes ago)     tome,Mercedes  Hi  Yes, she is cleared with low risk for perioperative cardiovascular complications.   Binnie Kand  On Mon, Oct 05, 2021 at 2:33 PM Pelerossi, Marcie Bal [email or web address]> wrote:   Hi Dr Deanna Artis  I writing about Garen Grams with MR number above, who you evaluated and who now has had her Echo, NM stress test, and extended event monitoring. Is she now optimized for surgery , which is a TKA on 10/12/21?  Thank you.  Maceo Pro NP  PAT         Physical Activity in METs 4 (2 flights of stairs possible but feels very tired; no CP or DOE; still working as Engineer, building services)    Pulmonary: Positive for obstructive sleep apnea (no CPAP ). Tobacco use: quit 20 years ago.   GU/Renal: Negative for GU/renal diseases.    Hepatic: Skin negative for hepatic disease.    Neurological: Negative for seizures and strokes.   Gastrointestinal: Positive for GERD.   Hematological: Bruises/bleeds easily:  bruises easily.   Endocrine: Negative for endocrine diseases/disorders.    HEENT: Negative for HEENT disorders.    Musculoskeletal: Negative for musculoskeletal diseases.    Psychiatric:        AUD in chart, 8 beers at a time on weekends.    No marijuana or illicit drugs   Skin: Negative for skin diseases.        Physical Exam    General     Level of consciousness:  Alert   BMI   BMI greater than 30 kg/m2   Airway     Mallampati:  III    TM distance:  >3 FB    Mouth opening:  >3 FB    Neck ROM:  Full   Teeth    (+) upper  dentures, implants  }   Heart   - normal exam     Lungs - normal exam  Breath sounds clear to auscultation     Other findings: Full upper denture  Bottom - some implants              Pertinent Labs:   Lab Results   Component Value Date    NA 139 08/03/2021    K 3.6 08/03/2021    CREAT 0.6 08/03/2021    GLUCOSER 117 08/03/2021    WBC 7.0 08/03/2021    HCT 36.1 08/03/2021    PLTA 300 08/03/2021    PT 11.6 08/03/2021    APTT 28.2 08/03/2021    INR 1.1 08/03/2021         Anesthesia Plan    ASA Score:     ASA:  3    Airway:      Mallampati:  III    Mouth opening:  >3 FB    Neck ROM:  Full    TM distance:  >3 FB    Plan: general and regional    Other information:     EKG Reviewed: : Yes      Anesthesia Assessment and Plan:        BMI 32. HTN. HLD. METS about 4, can walk 2 flights, will feel tired, but no CP or DOE. 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 Recent syncopal episode noted 08/03/21 with  some DOE and palpitations. Also had presyncopal episode about 3 months ago. In person visit 10/01/21; she has had no further syncopal episodes. EKG 08/03/21: NSR, inferior infarct present which was not noted 04/25/20.  Per cardiology eConsult 08/17/21, (see above) needs cardiology appt, echo and extended Holter (which started 08/17/21).   OSA, CPAP recommended but she doesn't use it. Significant GERD, uses omeprazole. AUD, discussed no alcohol prior to surgery.   Spinal, block, general anesthesia, sedation  discussed; pt does not want spinal but she will strongly consider;  Plan - check with the cardiologist that she is optimized for surgery.    Cardiology eval 08/28/21 Dr Deanna Artis (see above): noted DOE and EKG read as inf MI 08/03/21. Ordered ECHO 09/08/21, NM Stress 09/16/21, and 3 day Holter 08/1021. Will follow.   Results: Echo 09/08/21: EF 68%, no RWMA.  NM Stress 09/16/21: Normal.  Holter 09/24/21 (see above):  rare PACs, rare PVCs, asymptomatic.     Addendum: message from cardiologist 10/07/21 (see above): cleared with low risk for ardiovascular complications    DOS ANESTHESIA NOTE  Pt reports that she would greatly prefer general anesthesia. She notes hx life long back pain after spinal for c/s.  GETA with adductor canal block    Informed Consent:     Anesthetic plan and risks discussed with:  Patient   Patient Consented        Attending Anesthesiologist Statement:     Reassessed day of surgery: Yes        Assessment made, necessary equipment and appropriate plan in place.

## 2021-08-21 ENCOUNTER — Other Ambulatory Visit (HOSPITAL_BASED_OUTPATIENT_CLINIC_OR_DEPARTMENT_OTHER): Payer: Self-pay | Admitting: Family Medicine

## 2021-08-21 ENCOUNTER — Telehealth (HOSPITAL_BASED_OUTPATIENT_CLINIC_OR_DEPARTMENT_OTHER): Payer: Self-pay | Admitting: Orthopaedic Surgery

## 2021-08-21 DIAGNOSIS — R55 Syncope and collapse: Secondary | ICD-10-CM

## 2021-08-21 NOTE — Telephone Encounter (Signed)
Called pt to schedule echocardiogram with interpreter on phone.    Scheduled study for Jan 10 at [facility] [place].    She turns in her holter monitor today.        Deeann Saint, MD, 08/21/2021

## 2021-08-24 LAB — CYTOPATH, C/V, THIN LAYER

## 2021-08-25 MED FILL — OMEPRAZOLE 40MG: 90 days supply | Qty: 90 | Fill #3

## 2021-08-25 MED FILL — DULOXETINE 60MG: 90 days supply | Qty: 90 | Fill #1

## 2021-08-25 MED FILL — TRAZODONE 50MG: 90 days supply | Qty: 90 | Fill #3

## 2021-08-25 MED FILL — LOSARTAN POT 25MG: 90 days supply | Qty: 90 | Fill #1

## 2021-08-28 ENCOUNTER — Ambulatory Visit: Payer: No Typology Code available for payment source | Attending: Internal Medicine | Admitting: Internal Medicine

## 2021-08-28 ENCOUNTER — Other Ambulatory Visit: Payer: Self-pay

## 2021-08-28 VITALS — BP 123/80 | HR 94 | Temp 96.5°F | Wt 160.0 lb

## 2021-08-28 DIAGNOSIS — I10 Essential (primary) hypertension: Secondary | ICD-10-CM | POA: Insufficient documentation

## 2021-08-28 NOTE — Progress Notes (Signed)
CARDIOLOGY CLINIC CONSULT NOTE   Date of visit: 08/28/2021  Language: [place]    History of present illness:   60 year old female with PMH essential HTN, left knee osteoarthritis, dizziness, referred to [place] for preoperative clearance. Patient reports shortness of breath on moderate level of activities. She denies any chest pain, palpitations, leg edema, orthopnea, or PND. Positive dizziness particularly when standing up.     Most Recent BP Reading(s)  08/28/21 : 123/80  08/03/21 : 120/78  06/13/21 : 120/82  05/11/21 : 108/67  04/14/21 : 117/69      Most Recent Weight Reading(s)  08/28/21 : 72.6 kg (160 lb)  08/17/21 : 72.6 kg (160 lb)  08/03/21 : 72.8 kg (160 lb 6.4 oz)  05/11/21 : 73 kg (161 lb)  04/14/21 : 76.2 kg (168 lb)        Past Medical History:  Patient Active Problem List:     BMI 33.0-33.9,adult     Pure hypercholesterolemia     Family history of malignant neoplasm of gastrointestinal tract     Tubular adenoma of colon     Hyperopia with astigmatism and presbyopia     Immature cataract     Hypertension, goal below 140/90     Adenomatous polyp of colon     Squamous 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 eyes     Alcohol abuse     Depression, recurrent (HCC)     Trauma and stressor-related disorder     Nontraumatic complete tear of right rotator cuff     Localized osteoarthritis of left knee     Obesity (BMI 30-39.9)     Chronic pain of left knee      Allergies:  Review of Patient's Allergies indicates:  No Known Allergies    Meds:  Current Outpatient Medications   Medication Instructions    acetaminophen (TYLENOL) 1,000 mg, Oral    DULoxetine (CYMBALTA) 60 mg, Oral, DAILY, Stop taking duloxetine 30mg     ibuprofen (ADVIL) 600 mg, Oral, 2 TIMES DAILY PRN    losartan (COZAAR) 25 mg, Oral, DAILY    omeprazole (PRILOSEC) 40 mg, Oral, DAILY    traZODone (DESYREL) 50 mg, Oral, NIGHTLY       Social History:  .Former tobacco abuse, quit > 20 years ago, occasional alcohol, no illicit drugs    Family  History:  Brother had a heart attack at age 31    ROS: All other systems reviewed were pertinently negative apart from the history of present illness.    PHYSICAL EXAM:   08/28/21  1125   BP: 123/80   Site: Left Arm   Position: Sitting   Cuff Size: Large   Pulse: 94   Temp: 96.5 F (35.8 C)   TempSrc: Temporal   SpO2: 97%   Weight: 72.6 kg (160 lb)     0 lb (0 kg)  Body mass index is 31.78 kg/m.  General: NAD, well developed, well nourished.  HEENT: Oral mucosa is moist, no icterus, no pallor noted.  NECK: No jugular venous distention, no carotid bruits, 2+ carotid upstrokes.  CHEST: Clear to auscultation, no crackles or rhonchi. Nontender.  HEART:  No heaves or lifts. Regular rate and rhythm. No rubs. No murmur.  ABDOMEN: NABS. Soft. Nontender and nondistended.   EXTREMITIES: Warm and well perfused. No edema. 2+ pulses.   NEURO: A&O X 3, moves all extremities. Grossly nonfocal.  PSYCHIATRIC: Mood and affect are appropriate.      DATA:  LABS:   Lab Results   Component Value Date    NA 139 08/03/2021    K 3.6 08/03/2021    CL 99 08/03/2021    CO2 29 08/03/2021    BUN 16 08/03/2021    CREAT 0.6 08/03/2021    GLUCOSER 117 08/03/2021    LDL 127 05/14/2020    HDL 73 05/14/2020    TG 204 (H) 05/14/2020    ALT 34 08/03/2021    AST 28 08/03/2021    TSH 0.790 12/10/2016     No results found for: PROBNP  No results found for: BNP      EKG: (on my personal review) 08/03/21. NSR. Possible age undetermined inferior infarct.       Assessment and Plan:    1. Preoperative cardiac risk stratification, Patient needs cardiac clearance for left knee arthroplasty.   2. Dyspnea on exertion with moderate level of activities. Likely multifactorial including obesity, deconditioning. Need to rule out significant cardiomyopathy/valvular disease/ CAD  3. Family history of premature CAD  4. Former tobacco abuse  5. Obesity  6. Essential HTN, controlled.   7. Dizziness, mostly when standing up.     Plan  Results of EKG were discussed with  patient   Will order echocardiogram to rule out cardiomyopathy / valvular disease.   Will order an urgent pharmacologic nuclear stress test to rule out significant CAD  Will order a 3 day holter monitor to rule out significant cardiac arrhythmias  Continue current cardiovascular medications including Losartan  Will review stress test pending clearance and communicate with primary team/surgeon    Return for follow up in Cardiology Clinic in 2 months. To contact earlier if there are any cardiac related issues.     I spent a total of 60 minutes on this visit on the date of service (total time includes all activities performed on the date of service such as chart review, time speaking with the patient, documenting and communicating with other providers)   Thank you for the privilege of involving me in this patient's care.  Please feel free to contact me if you have any questions.  This patient encounter note was created using voice-recognition software and in real time. Please excuse any typographical errors that have not been edited out.     Minda Meo, MD  08/28/2021  12PM

## 2021-09-01 ENCOUNTER — Telehealth (HOSPITAL_BASED_OUTPATIENT_CLINIC_OR_DEPARTMENT_OTHER): Payer: Self-pay

## 2021-09-01 NOTE — Telephone Encounter (Signed)
LVM w/ [place] Interpreter Saralyn Pilar to call back [facility] to reschedule televisit on 01/04. Dr. Katy Apo out.

## 2021-09-02 ENCOUNTER — Ambulatory Visit: Payer: No Typology Code available for payment source | Admitting: Family Medicine

## 2021-09-08 ENCOUNTER — Other Ambulatory Visit: Payer: Self-pay

## 2021-09-08 ENCOUNTER — Ambulatory Visit (HOSPITAL_BASED_OUTPATIENT_CLINIC_OR_DEPARTMENT_OTHER)
Admission: RE | Admit: 2021-09-08 | Discharge: 2021-09-08 | Disposition: A | Discharge status: Home / Self Care | Payer: No Typology Code available for payment source | Source: Ambulatory Visit

## 2021-09-08 ENCOUNTER — Ambulatory Visit
Admission: RE | Admit: 2021-09-08 | Discharge: 2021-09-08 | Disposition: A | Discharge status: Home / Self Care | Payer: No Typology Code available for payment source | Attending: Family Medicine | Admitting: Family Medicine

## 2021-09-08 DIAGNOSIS — R55 Syncope and collapse: Secondary | ICD-10-CM | POA: Insufficient documentation

## 2021-09-08 DIAGNOSIS — I10 Essential (primary) hypertension: Secondary | ICD-10-CM

## 2021-09-08 LAB — ECHOCARDIOGRAM W/ DOPPLER: LVEF: 68 %

## 2021-09-09 ENCOUNTER — Encounter (HOSPITAL_BASED_OUTPATIENT_CLINIC_OR_DEPARTMENT_OTHER): Payer: Self-pay | Admitting: Family Medicine

## 2021-09-09 DIAGNOSIS — M1712 Unilateral primary osteoarthritis, left knee: Secondary | ICD-10-CM | POA: Insufficient documentation

## 2021-09-15 ENCOUNTER — Ambulatory Visit (HOSPITAL_BASED_OUTPATIENT_CLINIC_OR_DEPARTMENT_OTHER): Payer: Self-pay | Admitting: Rehabilitative and Restorative Service Providers"

## 2021-09-16 ENCOUNTER — Other Ambulatory Visit (HOSPITAL_BASED_OUTPATIENT_CLINIC_OR_DEPARTMENT_OTHER): Payer: Self-pay | Admitting: Internal Medicine

## 2021-09-16 ENCOUNTER — Ambulatory Visit (HOSPITAL_BASED_OUTPATIENT_CLINIC_OR_DEPARTMENT_OTHER)
Admission: RE | Admit: 2021-09-16 | Discharge: 2021-09-16 | Disposition: A | Discharge status: Home / Self Care | Payer: No Typology Code available for payment source | Source: Ambulatory Visit

## 2021-09-16 ENCOUNTER — Other Ambulatory Visit: Payer: Self-pay

## 2021-09-16 ENCOUNTER — Ambulatory Visit
Admission: RE | Admit: 2021-09-16 | Discharge: 2021-09-16 | Disposition: A | Discharge status: Home / Self Care | Payer: No Typology Code available for payment source | Attending: Internal Medicine | Admitting: Internal Medicine

## 2021-09-16 DIAGNOSIS — I10 Essential (primary) hypertension: Secondary | ICD-10-CM

## 2021-09-16 DIAGNOSIS — R9431 Abnormal electrocardiogram [ECG] [EKG]: Secondary | ICD-10-CM | POA: Insufficient documentation

## 2021-09-16 MED ORDER — AMINOPHYLLINE 25 MG/ML IV SOLN
50.0000 mg | INTRAVENOUS | Status: DC | PRN
Start: 2021-09-16 — End: 2021-09-17
  Filled 2021-09-16: qty 10

## 2021-09-16 MED ORDER — TECHNETIUM TC-99M SESTAMIBI (CARDIOLITE) INJECTION
9.00 | Freq: Once | INTRAVENOUS | Status: AC
Start: 2021-09-16 — End: 2021-09-16
  Administered 2021-09-16: 11.7 via INTRAVENOUS

## 2021-09-16 MED ORDER — REGADENOSON 0.4 MG/5ML IV SOLN
0.40 mg | Freq: Once | INTRAVENOUS | Status: AC
Start: 2021-09-16 — End: 2021-09-16
  Administered 2021-09-16: 0.4 mg via INTRAVENOUS
  Filled 2021-09-16: qty 5

## 2021-09-16 MED ORDER — TECHNETIUM TC-99M SESTAMIBI (CARDIOLITE) INJECTION
9.00 | Freq: Once | INTRAVENOUS | Status: AC
Start: 2021-09-16 — End: 2021-09-16
  Administered 2021-09-16: 33.4 via INTRAVENOUS

## 2021-09-16 NOTE — Progress Notes (Signed)
Pt. Arrived to the stress lab.   Test explained and consent obtained.   Brief history taken with no active chest pain.  No history of asthma and lung sounds clear upon assessment.   Vitals WNL for patient HR 72 BP 130/70 spO2  100%.   Intravenous Regadenoson (lexiscan) was injected, 418mcg over 10 seconds.   No chest pain throughout test.   Completed test without complications. 64% max predicted heart rate achieved.   Pt. Was observed for 5 minutes and left the stress lab in stable condition.   Please see Stress Report in Cardiopulmonary tab.     Leanna Sato, RN, 09/16/2021

## 2021-09-24 ENCOUNTER — Ambulatory Visit: Payer: No Typology Code available for payment source | Attending: Orthopaedic Surgery | Admitting: Physician Assistant

## 2021-09-24 ENCOUNTER — Encounter (HOSPITAL_BASED_OUTPATIENT_CLINIC_OR_DEPARTMENT_OTHER): Payer: Self-pay | Admitting: Physician Assistant

## 2021-09-24 ENCOUNTER — Other Ambulatory Visit: Payer: Self-pay

## 2021-09-24 ENCOUNTER — Other Ambulatory Visit (HOSPITAL_BASED_OUTPATIENT_CLINIC_OR_DEPARTMENT_OTHER): Payer: Self-pay

## 2021-09-24 ENCOUNTER — Telehealth (HOSPITAL_BASED_OUTPATIENT_CLINIC_OR_DEPARTMENT_OTHER): Payer: Self-pay

## 2021-09-24 VITALS — BP 147/91 | HR 92 | Temp 97.5°F | Resp 18 | Ht 59.0 in | Wt 160.0 lb

## 2021-09-24 DIAGNOSIS — Z0100 Encounter for examination of eyes and vision without abnormal findings: Secondary | ICD-10-CM

## 2021-09-24 DIAGNOSIS — Z01818 Encounter for other preprocedural examination: Secondary | ICD-10-CM | POA: Insufficient documentation

## 2021-09-24 DIAGNOSIS — M1712 Unilateral primary osteoarthritis, left knee: Secondary | ICD-10-CM | POA: Diagnosis present

## 2021-09-24 NOTE — Progress Notes (Signed)
Orthopedic Surgical H&P    CC: No chief complaint on file.    ORTHOPEDIC PROBLEM LIST:  1.  Left knee osteoarthritis    HPI: Stephanie [name] is a 61 year old female who presents for follow-up of left knee pain and for her pre-operative physical examination.  To review, patient has known underlying osteoarthritis of the left knee and is planning for left TKA with Dr. Mare Loan scheduled for 10/12/2021.  She has been working on the selection criteria.  Ironically, her left knee has not been hurting for the past 2 weeks after her daughter gave her an injection of medication from [place].  However, she would still like to proceed with surgery.  She saw her PCP and cardiology and had several tests done, including echocardiogram and stress test.  No one has contacted her about the results.  States she has 1 final dentist appointment and then they will give her the clearance form.  She does not have diabetes and does not smoke.  Her BMI is 32.32 today.  She completed her joint class and her PT televisit.  She has [facility] and is aware she will require a home discharge plan with outpatient PT, not yet scheduled.    [place] speaking telephone interpreter was used to conduct this visit.    ROS: Negative for fever, chills, dizziness, nausea, vomiting, chest pain, shortness of breath.    PMH: Past Medical History:  02/12/2016: Chronic right shoulder pain  No date: Depression  03/20/2012: Diverticulosis of colon      Comment:  Noted on colonoscopy 6/13   08/16/2010: Elevated hemoglobin A1c      Comment:  HEMOGLOBIN A1C (%) Date Value 03/06/2015 5.6                05/20/2014 5.4 05/08/2012 6.0 (H)   No results found for:               POCA1C   11/21/2012: Hyperopia with astigmatism and presbyopia  No date: Hypertension  11/21/2012: Immature cataract  01/21/2012: Impingement syndrome of right shoulder      Comment:  10/14: pt taking  [place] med (meloxicam 7.5 mg BID                and cyclobenzaprine); Will ask for refill once  supply                finished; PCP ok with refill of flexeril on temporary                basis;   01/26/2008: Irritable bladder      Comment:  Seen by Dr Jeraldine Loots 5/09, no incontinence, likely                irritable bladder.  Trial of anticholinergic, refer to                urology, has appt Dr Minna Antis 02/23/08  02/16/2005: Leiomyoma of uterus, unspecified      Comment:  Dr Jeraldine Loots hysterectomy November, 2006; cervix retained,               last pap 11/11 neg Repeat pap/HPV 07/2015   02/15/2014: Mass of hand  07/30/2011: Other plastic surgery for unacceptable cosmetic appearance      Comment:  Breast augmentation scheduled 08/02/11;  Dr.  Ancil Boozer Phone#: [phone]     01/08/2016: Schistosomiasis  08/13/2016: Squamous blepharitis of  upper and lower eyelids of both   eyes  06/18/2004: Urinary calculus, unspecified    Surgical HX: Past Surgical History:  No date: BREAST ENHANCEMENT SURGERY      Comment:  12/12 had bilateral mammoplasty and saline implants, had               2nd surgery 5/30 for revision  No date: MAMMAPLASTY AUGMENTATION W/PROSTHETIC IMPLANT  No date: MASTOPEXY  No date: OB ANTEPARTUM CARE CESAREAN DLVR & POSTPARTUM      Comment:  x3  No date: REDUCTION MAMMAPLASTY  No date: TOTAL ABDOMINAL HYSTERECT W/WO RMVL TUBE OVARY      Comment:  for fibroids, still has cervix    SH:   Social History     Socioeconomic History    Marital status: Divorced     Spouse name: Not on file    Number of children: Not on file    Years of education: Not on file    Highest education level: Not on file   Occupational History    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; 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   Tobacco Use    Smoking status: Former     Types: Cigarettes     Quit date: 07/27/2001     Years since quitting: 20.1    Smokeless tobacco: Never   Substance and Sexual Activity    Alcohol use: Yes     Alcohol/week: 6.0 standard drinks     Types: 6 Cans of beer per week     Comment: 6 beers per week     Drug  use: No    Sexual activity: Not Currently     Partners: Male     Comment: G3P3, no hx STD, no hx abn Pap   Other Topics Concern    Not on file   Social History Narrative    08/03/19 Divorced, has 3 children, lives alone, works as a Education administrator lady    Social Determinants of Librarian, academic Strain: Not on file  Food Insecurity: Not on file  Transportation Needs: Not on file  Physical Activity: Not on file  Stress: Not on file  Social Connections: Not on file  Intimate Partner Violence: Not on file  Housing Stability: Not on file    Family Hx: Review of patient's family history indicates:  Problem: Cancer - Other      Relation: Father          Age of Onset: (Not Specified)          Comment: stomach  Problem: Heart      Relation: Brother          Age of Onset: (Not Specified)          Comment: died at 6 during valve replacement  Problem: Heart      Relation: Brother          Age of Onset: (Not Specified)          Comment: chestpain with neg w/u  Problem: Heart      Relation: Mother          Age of Onset: (Not Specified)          Comment: Died of MI age 24  Problem: Diabetes      Relation: Mother          Age of Onset: (Not Specified)    Allergies: Review of Patient's Allergies indicates:  No Known Allergies    Current Medications:  Current Outpatient Medications:     DULoxetine (CYMBALTA) 60 MG capsule, Take 1 capsule by mouth in the morning. Stop taking duloxetine 30mg ., Disp: 30 capsule, Rfl: 5    ibuprofen (ADVIL) 600 MG tablet, Take 1 tablet by mouth 2 (two) times daily as needed for Pain, Disp: 100 tablet, Rfl: 2    losartan (COZAAR) 25 MG tablet, Take 1 tablet by mouth in the morning., Disp: 90 tablet, Rfl: 2    omeprazole (PRILOSEC) 40 MG capsule, Take 1 capsule by mouth in the morning., Disp: 30 capsule, Rfl: 11    acetaminophen (TYLENOL) 500 MG tablet, Take 1,000 mg by mouth, Disp: , Rfl:     traZODone (DESYREL) 50 MG tablet, Take 1 tablet by mouth nightly, Disp: 90 tablet, Rfl: 3    IMAGING:  X-ray of the left knee 522 shows severe medial compartment narrowing and mild patellofemoral joint space narrowing. Imaging was personally reviewed during today's visit.      PHYSICAL EXAM:  GENERAL: Alert and oriented, in no acute distress.   MOOD: Appropriate.   NECK: Supple without lymphadenopathy.   CARDIOVASCULAR: RRR without murmurs, rubs, or gallops.   PULMONARY: lungs clear to auscultation bilaterally without wheezes, rales, or rhonchi.    ABDOMINAL: soft, non-tender. (+) BS in all four quadrants.   MUSCULOSKELETAL: Examination of the left knee shows slight varus deformity.  Overlying skin is warm, dry, and intact.  There is no erythema, edema, or ecchymosis.  There is a small joint effusion.  There is medial joint line tenderness, no lateral joint line tenderness.  There is crepitus over the patella with active flexion and extension of the knee.  Active ROM 5-120 degrees.  Ligaments are stable to valgus and valgus stressing.  Calf is soft and nontender.  Neurovascularly intact distally.    ASSESSMENT/PLAN: Patient is a 61 year old female who presents today for follow-up of left knee pain due to severe underlying osteoarthritis and for her pre-operative physical examination.  She has failed conservative treatment modalities for her left knee pain and is therefore pursuing left TKA surgery.    We reviewed the likely diagnosis, the prognosis, and various treatment options in detail. After thoroughly discussing the risks and benefits of surgery, the patient consents to a left TKA with Dr. Madelaine Etienne. The tentative DOS is 10/12/21. The patient was educated regarding NPO after midnight the night before surgery and was given CHG wipes with complete instructions. They should be receiving a call from our secretary to discuss the arrival time, as well as a call from PAT to discuss medications prior to surgery. The patient will plan to follow up 10-14 days post-op for suture removal and wound check.  MRSA nasal swab was  done today.  She was given the carbohydrate drink.  I will reach out to her cardiologist to confirm that she is cleared after her recent testing.  Additionally, she will need to bring [place] her completed dental form after her appointment next week.  She has [facility] and will therefore require a home discharge plan and outpatient PT.  Urgent PT referral placed today.    Over 40 minutes was spent with the patient during the encounter, with more than 50% of time spent on counseling regarding details of surgery, along with the risks/benefits and recovery period. The patient's questions have been answered, and the patient understands and agrees with the treatment plan.     Patient was seen independently in [facility] today.  Denice Paradise, PA-C, 09/24/2021    Patient Selection Criteria for Total Joints:  ___ MRSA/MSSA nasal swab  ___COVID-19 nasopharyngeal swab  ___ If (+) Mupirocin Rx given  _x__ HbA1C < 8.0  _x__ BMI < 40.0  _x__ Not Smoking / Quit Smoking  _x__ Psychosocial issues discussed  _x__ No current substance abuse  _x__ Joint class attended  _x__ PT evaluation done  ___ Medical Clearance  ___ Dental Clearance  ___ CHG wipes given  ___ Attended PAT  ___ Case management screen

## 2021-09-24 NOTE — H&P (View-Only) (Signed)
Orthopedic Surgical H&P    CC: No chief complaint on file.    ORTHOPEDIC PROBLEM LIST:  1.  Left knee osteoarthritis    HPI: Charina Fons is a 61 year old female who presents for follow-up of left knee pain and for her pre-operative physical examination.  To review, patient has known underlying osteoarthritis of the left knee and is planning for left TKA with Dr. Mare Loan scheduled for 10/12/2021.  She has been working on the selection criteria.  Ironically, her left knee has not been hurting for the past 2 weeks after her daughter gave her an injection of medication from [place].  However, she would still like to proceed with surgery.  She saw her PCP and cardiology and had several tests done, including echocardiogram and stress test.  No one has contacted her about the results.  States she has 1 final dentist appointment and then they will give her the clearance form.  She does not have diabetes and does not smoke.  Her BMI is 32.32 today.  She completed her joint class and her PT televisit.  She has [facility] and is aware she will require a home discharge plan with outpatient PT, not yet scheduled.    [place] speaking telephone interpreter was used to conduct this visit.    ROS: Negative for fever, chills, dizziness, nausea, vomiting, chest pain, shortness of breath.    PMH: Past Medical History:  02/12/2016: Chronic right shoulder pain  No date: Depression  03/20/2012: Diverticulosis of colon      Comment:  Noted on colonoscopy 6/13   08/16/2010: Elevated hemoglobin A1c      Comment:  HEMOGLOBIN A1C (%) Date Value 03/06/2015 5.6                05/20/2014 5.4 05/08/2012 6.0 (H)   No results found for:               POCA1C   11/21/2012: Hyperopia with astigmatism and presbyopia  No date: Hypertension  11/21/2012: Immature cataract  01/21/2012: Impingement syndrome of right shoulder      Comment:  10/14: pt taking  [place] med (meloxicam 7.5 mg BID                and cyclobenzaprine); Will ask for refill once  supply                finished; PCP ok with refill of flexeril on temporary                basis;   01/26/2008: Irritable bladder      Comment:  Seen by Dr Jeraldine Loots 5/09, no incontinence, likely                irritable bladder.  Trial of anticholinergic, refer to                urology, has appt Dr Minna Antis 02/23/08  02/16/2005: Leiomyoma of uterus, unspecified      Comment:  Dr Jeraldine Loots hysterectomy November, 2006; cervix retained,               last pap 11/11 neg Repeat pap/HPV 07/2015   02/15/2014: Mass of hand  07/30/2011: Other plastic surgery for unacceptable cosmetic appearance      Comment:  Breast augmentation scheduled 08/02/11;  Dr.  Ancil Boozer Phone#: [phone]     01/08/2016: Schistosomiasis  08/13/2016: Squamous blepharitis of  upper and lower eyelids of both   eyes  06/18/2004: Urinary calculus, unspecified    Surgical HX: Past Surgical History:  No date: BREAST ENHANCEMENT SURGERY      Comment:  12/12 had bilateral mammoplasty and saline implants, had               2nd surgery 5/30 for revision  No date: MAMMAPLASTY AUGMENTATION W/PROSTHETIC IMPLANT  No date: MASTOPEXY  No date: OB ANTEPARTUM CARE CESAREAN DLVR & POSTPARTUM      Comment:  x3  No date: REDUCTION MAMMAPLASTY  No date: TOTAL ABDOMINAL HYSTERECT W/WO RMVL TUBE OVARY      Comment:  for fibroids, still has cervix    SH:   Social History     Socioeconomic History    Marital status: Divorced     Spouse name: Not on file    Number of children: Not on file    Years of education: Not on file    Highest education level: Not on file   Occupational History    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; 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   Tobacco Use    Smoking status: Former     Types: Cigarettes     Quit date: 07/27/2001     Years since quitting: 20.1    Smokeless tobacco: Never   Substance and Sexual Activity    Alcohol use: Yes     Alcohol/week: 6.0 standard drinks     Types: 6 Cans of beer per week     Comment: 6 beers per week     Drug  use: No    Sexual activity: Not Currently     Partners: Male     Comment: G3P3, no hx STD, no hx abn Pap   Other Topics Concern    Not on file   Social History Narrative    08/03/19 Divorced, has 3 children, lives alone, works as a Education administrator lady    Social Determinants of Librarian, academic Strain: Not on file  Food Insecurity: Not on file  Transportation Needs: Not on file  Physical Activity: Not on file  Stress: Not on file  Social Connections: Not on file  Intimate Partner Violence: Not on file  Housing Stability: Not on file    Family Hx: Review of patient's family history indicates:  Problem: Cancer - Other      Relation: Father          Age of Onset: (Not Specified)          Comment: stomach  Problem: Heart      Relation: Brother          Age of Onset: (Not Specified)          Comment: died at 36 during valve replacement  Problem: Heart      Relation: Brother          Age of Onset: (Not Specified)          Comment: chestpain with neg w/u  Problem: Heart      Relation: Mother          Age of Onset: (Not Specified)          Comment: Died of MI age 76  Problem: Diabetes      Relation: Mother          Age of Onset: (Not Specified)    Allergies: Review of Patient's Allergies indicates:  No Known Allergies    Current Medications:  Current Outpatient Medications:     DULoxetine (CYMBALTA) 60 MG capsule, Take 1 capsule by mouth in the morning. Stop taking duloxetine 30mg ., Disp: 30 capsule, Rfl: 5    ibuprofen (ADVIL) 600 MG tablet, Take 1 tablet by mouth 2 (two) times daily as needed for Pain, Disp: 100 tablet, Rfl: 2    losartan (COZAAR) 25 MG tablet, Take 1 tablet by mouth in the morning., Disp: 90 tablet, Rfl: 2    omeprazole (PRILOSEC) 40 MG capsule, Take 1 capsule by mouth in the morning., Disp: 30 capsule, Rfl: 11    acetaminophen (TYLENOL) 500 MG tablet, Take 1,000 mg by mouth, Disp: , Rfl:     traZODone (DESYREL) 50 MG tablet, Take 1 tablet by mouth nightly, Disp: 90 tablet, Rfl: 3    IMAGING:  X-ray of the left knee 522 shows severe medial compartment narrowing and mild patellofemoral joint space narrowing. Imaging was personally reviewed during today's visit.      PHYSICAL EXAM:  GENERAL: Alert and oriented, in no acute distress.   MOOD: Appropriate.   NECK: Supple without lymphadenopathy.   CARDIOVASCULAR: RRR without murmurs, rubs, or gallops.   PULMONARY: lungs clear to auscultation bilaterally without wheezes, rales, or rhonchi.    ABDOMINAL: soft, non-tender. (+) BS in all four quadrants.   MUSCULOSKELETAL: Examination of the left knee shows slight varus deformity.  Overlying skin is warm, dry, and intact.  There is no erythema, edema, or ecchymosis.  There is a small joint effusion.  There is medial joint line tenderness, no lateral joint line tenderness.  There is crepitus over the patella with active flexion and extension of the knee.  Active ROM 5-120 degrees.  Ligaments are stable to valgus and valgus stressing.  Calf is soft and nontender.  Neurovascularly intact distally.    ASSESSMENT/PLAN: Patient is a 61 year old female who presents today for follow-up of left knee pain due to severe underlying osteoarthritis and for her pre-operative physical examination.  She has failed conservative treatment modalities for her left knee pain and is therefore pursuing left TKA surgery.    We reviewed the likely diagnosis, the prognosis, and various treatment options in detail. After thoroughly discussing the risks and benefits of surgery, the patient consents to a left TKA with Dr. Madelaine Etienne. The tentative DOS is 10/12/21. The patient was educated regarding NPO after midnight the night before surgery and was given CHG wipes with complete instructions. They should be receiving a call from our secretary to discuss the arrival time, as well as a call from PAT to discuss medications prior to surgery. The patient will plan to follow up 10-14 days post-op for suture removal and wound check.  MRSA nasal swab was  done today.  She was given the carbohydrate drink.  I will reach out to her cardiologist to confirm that she is cleared after her recent testing.  Additionally, she will need to bring [place] her completed dental form after her appointment next week.  She has [facility] and will therefore require a home discharge plan and outpatient PT.  Urgent PT referral placed today.    Over 40 minutes was spent with the patient during the encounter, with more than 50% of time spent on counseling regarding details of surgery, along with the risks/benefits and recovery period. The patient's questions have been answered, and the patient understands and agrees with the treatment plan.     Patient was seen independently in [facility] today.  Denice Paradise, PA-C, 09/24/2021    Patient Selection Criteria for Total Joints:  ___ MRSA/MSSA nasal swab  ___COVID-19 nasopharyngeal swab  ___ If (+) Mupirocin Rx given  _x__ HbA1C < 8.0  _x__ BMI < 40.0  _x__ Not Smoking / Quit Smoking  _x__ Psychosocial issues discussed  _x__ No current substance abuse  _x__ Joint class attended  _x__ PT evaluation done  ___ Medical Clearance  ___ Dental Clearance  ___ CHG wipes given  ___ Attended PAT  ___ Case management screen

## 2021-09-24 NOTE — Telephone Encounter (Signed)
REFERRAL REQUEST- PROVIDER, PLEASE REVIEW AND SIGN ORDER IF APPROPRIATE      HOW IS REFERRAL BEING REQUESTED: phone    WHO IS REFERRAL BEING REQUESTED BY: Patient    REFERRED TO SPECIALTY: ophthalmology    DIAGNOSIS/CHIEF COMPLAINT: routing eye exam    HAVE YOU SEEN YOUR PCP FOR THIS ISSUE: no    HAVE YOU SEEN YOUR PCP WITHIN THE LAST YEAR: yes

## 2021-09-24 NOTE — Patient Instructions (Signed)
PRE-OPERATIVE INSTRUCTIONS   1. Please use the CHG wipes you were given the night before surgery and the morning of surgery on your whole body besides your face and private parts.  2. Do not eat or drink anything after midnight the night before surgery, except for clear liquids up until two hours before surgery and the Ensure drink on the way to the hospital.   3. Shower the night before surgery and use clean sheets on your bed.  4. You will receive a phone call from our secretary to tell you what time to arrive on your day of surgery.   5. You may receive a phone call from anesthesia to review your health and medications prior to surgery.  6. If you have serious medical issues, you may need to come in to meet with anesthesia for pre-admission testing. If this is the case, our secretary will call you to inform you of the date and time of the appointment.   7. If you are given Aspirin or Lovenox post-operatively, these are to prevent blood clots. It is extremely important that you take these as prescribed.   8. If you take Aspirin or non-steroidal medications (NSAIDs) such as Ibuprofen, Aleve, Mobic, or Relafen, stop these 7 days prior to surgery. If you have a cardiac stent or are on other blood thinners such as Warfarin or Plavix, we will need to discuss you taking these medications prior to surgery with your cardiologist.   9. If you have any questions, don't hesitate to call us at [phone].

## 2021-09-24 NOTE — H&P (Signed)
Orthopedic Surgical H&P    CC: No chief complaint on file.    ORTHOPEDIC PROBLEM LIST:  1.  Left knee osteoarthritis    HPI: Stephanie [name] is a 61 year old female who presents for follow-up of left knee pain and for her pre-operative physical examination.  To review, patient has known underlying osteoarthritis of the left knee and is planning for left TKA with Dr. Mare Loan scheduled for 10/12/2021.  She has been working on the selection criteria.  Ironically, her left knee has not been hurting for the past 2 weeks after her daughter gave her an injection of medication from [place].  However, she would still like to proceed with surgery.  She saw her PCP and cardiology and had several tests done, including echocardiogram and stress test.  No one has contacted her about the results.  States she has 1 final dentist appointment and then they will give her the clearance form.  She does not have diabetes and does not smoke.  Her BMI is 32.32 today.  She completed her joint class and her PT televisit.  She has [facility] and is aware she will require a home discharge plan with outpatient PT, not yet scheduled.    [place] speaking telephone interpreter was used to conduct this visit.    ROS: Negative for fever, chills, dizziness, nausea, vomiting, chest pain, shortness of breath.    PMH: Past Medical History:  02/12/2016: Chronic right shoulder pain  No date: Depression  03/20/2012: Diverticulosis of colon      Comment:  Noted on colonoscopy 6/13   08/16/2010: Elevated hemoglobin A1c      Comment:  HEMOGLOBIN A1C (%) Date Value 03/06/2015 5.6                05/20/2014 5.4 05/08/2012 6.0 (H)   No results found for:               POCA1C   11/21/2012: Hyperopia with astigmatism and presbyopia  No date: Hypertension  11/21/2012: Immature cataract  01/21/2012: Impingement syndrome of right shoulder      Comment:  10/14: pt taking  [place] med (meloxicam 7.5 mg BID                and cyclobenzaprine); Will ask for refill once  supply                finished; PCP ok with refill of flexeril on temporary                basis;   01/26/2008: Irritable bladder      Comment:  Seen by Dr Jeraldine Loots 5/09, no incontinence, likely                irritable bladder.  Trial of anticholinergic, refer to                urology, has appt Dr Minna Antis 02/23/08  02/16/2005: Leiomyoma of uterus, unspecified      Comment:  Dr Jeraldine Loots hysterectomy November, 2006; cervix retained,               last pap 11/11 neg Repeat pap/HPV 07/2015   02/15/2014: Mass of hand  07/30/2011: Other plastic surgery for unacceptable cosmetic appearance      Comment:  Breast augmentation scheduled 08/02/11;  Dr.  Ancil Boozer Phone#: [phone]     01/08/2016: Schistosomiasis  08/13/2016: Squamous blepharitis of  upper and lower eyelids of both   eyes  06/18/2004: Urinary calculus, unspecified    Surgical HX: Past Surgical History:  No date: BREAST ENHANCEMENT SURGERY      Comment:  12/12 had bilateral mammoplasty and saline implants, had               2nd surgery 5/30 for revision  No date: MAMMAPLASTY AUGMENTATION W/PROSTHETIC IMPLANT  No date: MASTOPEXY  No date: OB ANTEPARTUM CARE CESAREAN DLVR & POSTPARTUM      Comment:  x3  No date: REDUCTION MAMMAPLASTY  No date: TOTAL ABDOMINAL HYSTERECT W/WO RMVL TUBE OVARY      Comment:  for fibroids, still has cervix    SH:   Social History     Socioeconomic History    Marital status: Divorced     Spouse name: Not on file    Number of children: Not on file    Years of education: Not on file    Highest education level: Not on file   Occupational History    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; 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   Tobacco Use    Smoking status: Former     Types: Cigarettes     Quit date: 07/27/2001     Years since quitting: 20.1    Smokeless tobacco: Never   Substance and Sexual Activity    Alcohol use: Yes     Alcohol/week: 6.0 standard drinks     Types: 6 Cans of beer per week     Comment: 6 beers per week     Drug  use: No    Sexual activity: Not Currently     Partners: Male     Comment: G3P3, no hx STD, no hx abn Pap   Other Topics Concern    Not on file   Social History Narrative    08/03/19 Divorced, has 3 children, lives alone, works as a Education administrator lady    Social Determinants of Librarian, academic Strain: Not on file  Food Insecurity: Not on file  Transportation Needs: Not on file  Physical Activity: Not on file  Stress: Not on file  Social Connections: Not on file  Intimate Partner Violence: Not on file  Housing Stability: Not on file    Family Hx: Review of patient's family history indicates:  Problem: Cancer - Other      Relation: Father          Age of Onset: (Not Specified)          Comment: stomach  Problem: Heart      Relation: Brother          Age of Onset: (Not Specified)          Comment: died at 14 during valve replacement  Problem: Heart      Relation: Brother          Age of Onset: (Not Specified)          Comment: chestpain with neg w/u  Problem: Heart      Relation: Mother          Age of Onset: (Not Specified)          Comment: Died of MI age 8  Problem: Diabetes      Relation: Mother          Age of Onset: (Not Specified)    Allergies: Review of Patient's Allergies indicates:  No Known Allergies    Current Medications:  Current Outpatient Medications:     DULoxetine (CYMBALTA) 60 MG capsule, Take 1 capsule by mouth in the morning. Stop taking duloxetine 30mg ., Disp: 30 capsule, Rfl: 5    ibuprofen (ADVIL) 600 MG tablet, Take 1 tablet by mouth 2 (two) times daily as needed for Pain, Disp: 100 tablet, Rfl: 2    losartan (COZAAR) 25 MG tablet, Take 1 tablet by mouth in the morning., Disp: 90 tablet, Rfl: 2    omeprazole (PRILOSEC) 40 MG capsule, Take 1 capsule by mouth in the morning., Disp: 30 capsule, Rfl: 11    acetaminophen (TYLENOL) 500 MG tablet, Take 1,000 mg by mouth, Disp: , Rfl:     traZODone (DESYREL) 50 MG tablet, Take 1 tablet by mouth nightly, Disp: 90 tablet, Rfl: 3    IMAGING:  X-ray of the left knee 522 shows severe medial compartment narrowing and mild patellofemoral joint space narrowing. Imaging was personally reviewed during today's visit.      PHYSICAL EXAM:  GENERAL: Alert and oriented, in no acute distress.   MOOD: Appropriate.   NECK: Supple without lymphadenopathy.   CARDIOVASCULAR: RRR without murmurs, rubs, or gallops.   PULMONARY: lungs clear to auscultation bilaterally without wheezes, rales, or rhonchi.    ABDOMINAL: soft, non-tender. (+) BS in all four quadrants.   MUSCULOSKELETAL: Examination of the left knee shows slight varus deformity.  Overlying skin is warm, dry, and intact.  There is no erythema, edema, or ecchymosis.  There is a small joint effusion.  There is medial joint line tenderness, no lateral joint line tenderness.  There is crepitus over the patella with active flexion and extension of the knee.  Active ROM 5-120 degrees.  Ligaments are stable to valgus and valgus stressing.  Calf is soft and nontender.  Neurovascularly intact distally.    ASSESSMENT/PLAN: Patient is a 61 year old female who presents today for follow-up of left knee pain due to severe underlying osteoarthritis and for her pre-operative physical examination.  She has failed conservative treatment modalities for her left knee pain and is therefore pursuing left TKA surgery.    We reviewed the likely diagnosis, the prognosis, and various treatment options in detail. After thoroughly discussing the risks and benefits of surgery, the patient consents to a left TKA with Dr. Madelaine Etienne. The tentative DOS is 10/12/21. The patient was educated regarding NPO after midnight the night before surgery and was given CHG wipes with complete instructions. They should be receiving a call from our secretary to discuss the arrival time, as well as a call from PAT to discuss medications prior to surgery. The patient will plan to follow up 10-14 days post-op for suture removal and wound check.  MRSA nasal swab was  done today.  She was given the carbohydrate drink.  I will reach out to her cardiologist to confirm that she is cleared after her recent testing.  Additionally, she will need to bring [place] her completed dental form after her appointment next week.  She has [facility] and will therefore require a home discharge plan and outpatient PT.  Urgent PT referral placed today.    Over 40 minutes was spent with the patient during the encounter, with more than 50% of time spent on counseling regarding details of surgery, along with the risks/benefits and recovery period. The patient's questions have been answered, and the patient understands and agrees with the treatment plan.     Patient was seen independently in [facility] today.  Denice Paradise, PA-C, 09/24/2021    Patient Selection Criteria for Total Joints:  ___ MRSA/MSSA nasal swab  ___COVID-19 nasopharyngeal swab  ___ If (+) Mupirocin Rx given  _x__ HbA1C < 8.0  _x__ BMI < 40.0  _x__ Not Smoking / Quit Smoking  _x__ Psychosocial issues discussed  _x__ No current substance abuse  _x__ Joint class attended  _x__ PT evaluation done  ___ Medical Clearance  ___ Dental Clearance  ___ CHG wipes given  ___ Attended PAT  ___ Case management screen

## 2021-09-24 NOTE — Progress Notes (Signed)
09/24/2021  3:15 PM    CHG wipes given to patient by Stormy Fabian, RN  under the direction of Lauren A PAC.  Directions in [place] given and reviewed with patient  prior to surgery  Patient informed of number to call at clinic  with any questions.    Pt given instructions for ENSURE drink.

## 2021-09-26 LAB — MRSA/MSSA CULTURE
MRSA CULTURE: NEGATIVE
MSSA CULTURE: NEGATIVE

## 2021-10-01 ENCOUNTER — Ambulatory Visit (HOSPITAL_BASED_OUTPATIENT_CLINIC_OR_DEPARTMENT_OTHER)
Admission: RE | Admit: 2021-10-01 | Discharge: 2021-10-01 | Disposition: A | Discharge status: Home / Self Care | Payer: No Typology Code available for payment source | Source: Ambulatory Visit

## 2021-10-01 ENCOUNTER — Ambulatory Visit
Admission: RE | Admit: 2021-10-01 | Discharge: 2021-10-01 | Disposition: A | Discharge status: Home / Self Care | Payer: No Typology Code available for payment source

## 2021-10-01 ENCOUNTER — Encounter (HOSPITAL_BASED_OUTPATIENT_CLINIC_OR_DEPARTMENT_OTHER): Payer: Self-pay

## 2021-10-01 ENCOUNTER — Other Ambulatory Visit: Payer: Self-pay

## 2021-10-01 HISTORY — DX: Other specified postprocedural states: Z98.890

## 2021-10-01 HISTORY — DX: Nausea with vomiting, unspecified: R11.2

## 2021-10-01 NOTE — Discharge Instructions (Signed)
INSTRUES DO PR-OPERATRIO PARA O SURGICAL DAY CARE   SURGICAL DAY CARE PRE-OPERATIVE INSTRUCTIONS    DIA DA [place] em: [place] (Monday), [place] (February) 13 s (Time): 11:30 am.   Arrive at:  Registration on (Day of the Week, Month, Day, at (Time).       The day before surgery our Office will call with updated arrival time.     No aspirin, ibuprofen ( advil. Motrin, alleve) for 1 week before surgery, tylenol is OK.      obrigatrio ter um adulto responsvel disponvel para acompanh-lo at [employer] a [place]. (Sugerimos que voc tenha algum disponvel para ajud-lo em casa aps a sua cirurgia).  You must have a responsible adult available to accompany you home after surgery. (We suggest that you have someone available to assist you at home after your surgery).    INSTRUES:   INSTRUCTIONS:  Voc no poder comer ou beber nada aps a meia-noite da noite anterior  cirurgia, nem mesmo gua, goma de mascar ou balas.   You may have nothing to eat or drink after midnight the night before your surgery, not even water, gum or hard candy.    Voc no poder fumar na manh do dia da sua cirurgia.  You may not smoke the morning of your surgery.    Por favor, deixe em casa todos os objetos de valor, incluindo joias, relgios, dinheiro, etc.  Please leave all valuables at home, including jewelry, watches, money, etc.    Remova qualquer esmalte das unhas antes de chegar ao Surgical Day Care e no use qualquer maquiagem ou batom.  Please remove all fingernail polish before arriving at Surgical Day Care and do not  wear any face or lip make-up.    Se for fazer cirurgia ocular, no aplique maquiagem nos olhos ou no rosto e evite hidratantes faciais e perfumes.  If you are having eye surgery, do not wear any eye or face makeup and avoid facial moisturizers and perfumes.    Por favor, retire quaisquer extenses de cabelo que possam ser [employer].  Please take  out any hair extensions that can be removed.    No raspe os pelos da rea da cirurgia.  Do not shave surgical site.    No use lentes de contato.  Do not wear contact lenses.    MEDICAMENTOS:   MEDICATIONS:     Tome a medicao a seguir na noite anterior  cirurgia, na hora de dormir: usual medicines   Take the following medication the night before surgery at bedtime:     Tome a [place] a seguir na manh do dia da sua cirurgia, com apenas um gole de gua:    duloxetine omeprazole       consume carbohydrate drink before leaving for hospital  Take the following medication the morning of your surgery with only a sip of water:

## 2021-10-01 NOTE — Surgery Pre-Op (Signed)
PAT Visit          Laine Fonner is a 61 year old female received a preoperative screening.        Appointments for Next 30 Days 10/01/2021 - 10/31/2021      Date Visit Type Department Provider     10/01/2021 10:30 AM PRE-ADMISSION TESTING VISIT [facility] ANESTHESIA NP    Patient Instructions:     Please bring your current home medications or a list of home medications on the day of your appointment.               10/19/2021 10:00 AM PRIORITY THERAPY EVAL [place], PT    Patient Instructions:                  10/28/2021 11:00 AM ORTHO POST OP [place], [place]    Patient Instructions:                   Surgery Information     Future Procedures (Tomorrow to 10/01/2022)       Date Time Procedures w/ Laterality Providers Location Status        10/12/2021 1335 ARTHROPLASTY, KNEE, TOTAL - Left [facility] OR Scheduled      Procedures w/ Laterality: ARTHROPLASTY, KNEE, TOTAL - Left                      Procedure(s):  ARTHROPLASTY, KNEE, TOTAL  10/12/2021        .     Language of care:Portuguese ([place])    Allergy History  Patient has no known allergies.      Past Medical History:  02/12/2016: Chronic right shoulder pain  No date: Depression  03/20/2012: Diverticulosis of colon      Comment:  Noted on colonoscopy 6/13   08/16/2010: Elevated hemoglobin A1c      Comment:  HEMOGLOBIN A1C (%) Date Value 03/06/2015 5.6                05/20/2014 5.4 05/08/2012 6.0 (H)   No results found for:               POCA1C   11/21/2012: Hyperopia with astigmatism and presbyopia  No date: Hypertension  11/21/2012: Immature cataract  01/21/2012: Impingement syndrome of right shoulder      Comment:  10/14: pt taking  [place] med (meloxicam 7.5 mg BID                and cyclobenzaprine); Will ask for refill once supply                finished; PCP ok with refill of flexeril on temporary                 basis;   01/26/2008: Irritable bladder      Comment:  Seen by Dr Jeraldine Loots 5/09, no incontinence, likely                irritable bladder.  Trial of anticholinergic, refer to                urology, has appt Dr Minna Antis 02/23/08  02/16/2005: Leiomyoma of uterus, unspecified      Comment:  Dr Jeraldine Loots hysterectomy November, 2006; cervix retained,  last pap 11/11 neg Repeat pap/HPV 07/2015   02/15/2014: Mass of hand  07/30/2011: Other plastic surgery for unacceptable cosmetic appearance      Comment:  Breast augmentation scheduled 08/02/11;  Dr.  Ancil Boozer Phone#: [phone]     No date: PONV (postoperative nausea and vomiting)  01/08/2016: Schistosomiasis  08/13/2016: Squamous 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   eyes  06/18/2004: Urinary calculus, unspecified     has a past surgical history that includes OB ANTEPARTUM CARE C DLVR&POSTPARTUM; TAH +-RMVL TUBE +-RMVL OVARY; Breast enhancement surgery; MAMMAPLASTY AUGMENTATION W/PROSTHETIC IMPLANT; MASTOPEXY; and REDUCTION MAMMAPLASTY.         Alcohol, Tobacco and Drug History:  Social History    Tobacco Use      Smoking status: Former        Types: Cigarettes        Quit date: 07/27/2001        Years since quitting: 20.1      Smokeless tobacco: Never    E-Cigarette/Vaping  E-Cigarette Use:   Quit Date:   Nicotine:   Start Date:   THC:    Cartridges/Day:   CBD:   Other Substance:   Flavoring:       Alcohol use Yes 6.0 standard drinks/week 6 Cans of beer per week   Comment: 6 beers per week         Drug use: No       Extended Emergency Contact Information  Primary Emergency Contact: GARCIA,FELIPE  Address: [address]           [place], [place] [address] [place] Phone: [phone]  Relation: Son        Current Outpatient Medications   Medication Sig    Glucosamine 750 MG TABS Take by mouth    Omega-3 Fatty Acids (FISH OIL) 1000 MG CAPS Take by mouth    Turmeric 500 MG CAPS Take by mouth     dexamethasone (DECADRON) 4 MG tablet Take 4 mg by mouth in the morning and 4 mg in the evening. Take with meals.    DULoxetine (CYMBALTA) 60 MG capsule Take 1 capsule by mouth in the morning. Stop taking duloxetine 30mg .    ibuprofen (ADVIL) 600 MG tablet Take 1 tablet by mouth 2 (two) times daily as needed for Pain    losartan (COZAAR) 25 MG tablet Take 1 tablet by mouth in the morning.    omeprazole (PRILOSEC) 40 MG capsule Take 1 capsule by mouth in the morning.    acetaminophen (TYLENOL) 500 MG tablet Take 1,000 mg by mouth    traZODone (DESYREL) 50 MG tablet Take 1 tablet by mouth nightly     No current facility-administered medications for this encounter.         (Not in a hospital admission)      PAT Assessment    Airways symptoms: DENTURES/PARTIAL: Yes    Airways WDL     Activity tolerance (How many flights of stairs): no sob or cp with stairs      Assistive Device: Cane           PAT Screening     Row Name 10/01/21 [phone]       Integumentary - Complete full head to toe skin assessment    Integumentary (WDL) WDL    Row Name 10/01/21 [phone]  STOP-Bang Questionaire     Do you snore loudly (loud enough to be heard through closed doors or   your bed-partner elbows you for snoring at night? 1    Do you often feel Tired, fatigued, or Sleepy during the daytime (such as   falling asleep when driving)? 1    Has anyone observed you stop breathing, or choking/gasping during your   sleep? 1    Do you have, or are being treated for, High Blood Pressure? 1    Height 4\' 11"  (1.499 m)    Weight 73 kg (161 lb)    BMI (Calculated) 32.59    Neck size large? 1    Stop-Bang Score High    Row Name 10/01/21 0937       STOP-Bang Questionaire    Height 4\' 11"  (1.499 m)    Weight 73.2 kg (161 lb 6.4 oz)    BMI (Calculated) 32.67                       10/01/21  0937 10/01/21  0953   BP: 129/82    Pulse: 83    Temp: 97.5 F (36.4 C)    TempSrc: Temporal    SpO2: 99%    Weight: 73.2 kg (161 lb 6.4 oz) 73 kg (161 lb)   Height: 4'  11" (1.499 m) 4\' 11"  (1.499 m)           Patient issues currently are       .  Medication instructions were given. AVS given to patient.     Donalda Ewings, RN

## 2021-10-12 ENCOUNTER — Encounter (HOSPITAL_BASED_OUTPATIENT_CLINIC_OR_DEPARTMENT_OTHER)
Admission: RE | Disposition: A | Discharge status: Home Health | Payer: Self-pay | Source: Ambulatory Visit | Attending: Orthopaedic Surgery

## 2021-10-12 ENCOUNTER — Inpatient Hospital Stay (HOSPITAL_BASED_OUTPATIENT_CLINIC_OR_DEPARTMENT_OTHER): Payer: No Typology Code available for payment source | Admitting: Anesthesiology

## 2021-10-12 ENCOUNTER — Inpatient Hospital Stay
Admission: RE | Admit: 2021-10-12 | Discharge: 2021-10-14 | DRG: 326 | Disposition: A | Discharge status: Home Health | Payer: No Typology Code available for payment source | Attending: Orthopaedic Surgery | Admitting: Orthopaedic Surgery

## 2021-10-12 ENCOUNTER — Inpatient Hospital Stay (HOSPITAL_BASED_OUTPATIENT_CLINIC_OR_DEPARTMENT_OTHER): Payer: No Typology Code available for payment source

## 2021-10-12 ENCOUNTER — Encounter (HOSPITAL_BASED_OUTPATIENT_CLINIC_OR_DEPARTMENT_OTHER): Payer: Self-pay | Admitting: Orthopaedic Surgery

## 2021-10-12 DIAGNOSIS — M1712 Unilateral primary osteoarthritis, left knee: Secondary | ICD-10-CM | POA: Diagnosis present

## 2021-10-12 DIAGNOSIS — Z471 Aftercare following joint replacement surgery: Secondary | ICD-10-CM | POA: Diagnosis not present

## 2021-10-12 DIAGNOSIS — Z96652 Presence of left artificial knee joint: Secondary | ICD-10-CM

## 2021-10-12 HISTORY — DX: Unilateral primary osteoarthritis, left knee: M17.12

## 2021-10-12 LAB — RESPIRATORY PANEL BASIC INPAT
INFLUENZA A: NEGATIVE
INFLUENZA B: NEGATIVE
RESPIRATORY SYNCYTIAL VIRUS: NEGATIVE
SARS-COV-2: NEGATIVE

## 2021-10-12 SURGERY — ARTHROPLASTY, KNEE, TOTAL
Anesthesia: Regional | Site: Knee | Laterality: Left | Wound class: Class I/ Clean

## 2021-10-12 MED ORDER — ROCURONIUM BROMIDE 100 MG/10ML IV SOLN
INTRAVENOUS | Status: AC
Start: 2021-10-12 — End: 2021-10-12
  Filled 2021-10-12: qty 10

## 2021-10-12 MED ORDER — DEXTROSE-NACL 5-0.45 % IV SOLN
INTRAVENOUS | Status: DC
Start: 2021-10-12 — End: 2021-10-12

## 2021-10-12 MED ORDER — FENTANYL CITRATE 0.05 MG/ML IJ SOLN
Freq: Once | INTRAMUSCULAR | Status: DC | PRN
Start: 2021-10-12 — End: 2021-10-13
  Administered 2021-10-12 (×2): 50 ug via INTRAVENOUS

## 2021-10-12 MED ORDER — TRANEXAMIC ACID 1000 MG/10ML IV SOLN
10.0000 mg/kg | Freq: Once | INTRAVENOUS | Status: DC
Start: 2021-10-12 — End: 2021-10-12
  Administered 2021-10-12: 730 mg via INTRAVENOUS
  Filled 2021-10-12: qty 7.3

## 2021-10-12 MED ORDER — HYDROMORPHONE HCL 2 MG/ML IJ SOLN (SUPER ERX)
Freq: Once | Status: DC | PRN
Start: 2021-10-12 — End: 2021-10-13
  Administered 2021-10-12 (×2): .4 mg via INTRAVENOUS

## 2021-10-12 MED ORDER — LIDOCAINE HCL (PF) 2 % IJ SOLN
Freq: Once | INTRAMUSCULAR | Status: DC | PRN
Start: 2021-10-12 — End: 2021-10-13
  Administered 2021-10-12: 100 mg via INTRAVENOUS

## 2021-10-12 MED ORDER — ACETAMINOPHEN 500 MG PO TABS
1000.0000 mg | ORAL_TABLET | Freq: Three times a day (TID) | ORAL | Status: DC
Start: 2021-10-12 — End: 2021-10-14
  Administered 2021-10-12 – 2021-10-14 (×6): 1000 mg via ORAL
  Filled 2021-10-12 (×6): qty 2

## 2021-10-12 MED ORDER — SODIUM CHLORIDE (PF) 0.9 % IJ SOLN
INTRAMUSCULAR | Status: AC
Start: 2021-10-12 — End: 2021-10-12
  Filled 2021-10-12: qty 10

## 2021-10-12 MED ORDER — DIPHENHYDRAMINE HCL 25 MG PO CAPS
25.0000 mg | ORAL_CAPSULE | Freq: Four times a day (QID) | ORAL | Status: DC | PRN
Start: 2021-10-12 — End: 2021-10-14

## 2021-10-12 MED ORDER — POLYETHYLENE GLYCOL 3350 17 G PO PACK
17.0000 g | PACK | Freq: Every day | ORAL | Status: DC | PRN
Start: 2021-10-12 — End: 2021-10-14

## 2021-10-12 MED ORDER — DULOXETINE HCL 30 MG PO CPEP
60.0000 mg | ORAL_CAPSULE | Freq: Every day | ORAL | Status: DC
Start: 2021-10-13 — End: 2021-10-14
  Administered 2021-10-13 – 2021-10-14 (×2): 60 mg via ORAL
  Filled 2021-10-12 (×2): qty 2

## 2021-10-12 MED ORDER — OXYCODONE-ACETAMINOPHEN 5-325 MG PO TABS
1.0000 | ORAL_TABLET | ORAL | Status: DC | PRN
Start: 2021-10-12 — End: 2021-10-12
  Administered 2021-10-12: 1 via ORAL
  Filled 2021-10-12: qty 1

## 2021-10-12 MED ORDER — PROPOFOL 200 MG/20ML IV EMUL
INTRAVENOUS | Status: AC
Start: 2021-10-12 — End: 2021-10-12
  Filled 2021-10-12: qty 20

## 2021-10-12 MED ORDER — ROPIVACAINE HCL 5 MG/ML IJ SOLN
INTRAMUSCULAR | Status: AC
Start: 2021-10-12 — End: 2021-10-12
  Administered 2021-10-12: 30 mL

## 2021-10-12 MED ORDER — PROPOFOL 500 MG/50 ML IV
INTRAVENOUS | Status: DC | PRN
Start: 2021-10-12 — End: 2021-10-13
  Administered 2021-10-12: 30 ug/kg/min via INTRAVENOUS
  Administered 2021-10-12: 100 ug/kg/min via INTRAVENOUS

## 2021-10-12 MED ORDER — OXYCODONE HCL 5 MG PO TABS
10.0000 mg | ORAL_TABLET | ORAL | Status: DC | PRN
Start: 2021-10-12 — End: 2021-10-14
  Administered 2021-10-13 – 2021-10-14 (×7): 10 mg via ORAL
  Filled 2021-10-12 (×7): qty 2

## 2021-10-12 MED ORDER — LOSARTAN POTASSIUM 25 MG PO TABS
25.0000 mg | ORAL_TABLET | Freq: Every day | ORAL | Status: DC
Start: 2021-10-13 — End: 2021-10-14
  Administered 2021-10-13 – 2021-10-14 (×2): 25 mg via ORAL
  Filled 2021-10-12 (×2): qty 1

## 2021-10-12 MED ORDER — PROPOFOL INFUSION
INTRAVENOUS | Status: AC
Start: 2021-10-12 — End: 2021-10-12
  Filled 2021-10-12: qty 50

## 2021-10-12 MED ORDER — OXYCODONE HCL 5 MG PO TABS
5.0000 mg | ORAL_TABLET | ORAL | Status: DC | PRN
Start: 2021-10-12 — End: 2021-10-14

## 2021-10-12 MED ORDER — PANTOPRAZOLE SODIUM 40 MG PO TBEC
40.0000 mg | DELAYED_RELEASE_TABLET | Freq: Every day | ORAL | Status: DC
Start: 2021-10-13 — End: 2021-10-14
  Administered 2021-10-13 – 2021-10-14 (×2): 40 mg via ORAL
  Filled 2021-10-12 (×2): qty 1

## 2021-10-12 MED ORDER — TRANEXAMIC ACID 1000 MG/10ML IV SOLN
10.00 mg/kg | Freq: Once | INTRAVENOUS | Status: AC
Start: 2021-10-12 — End: 2021-10-12
  Administered 2021-10-12: 730 mg via INTRAVENOUS
  Filled 2021-10-12: qty 7.3

## 2021-10-12 MED ORDER — ACETAMINOPHEN 500 MG PO TABS
1000.00 mg | ORAL_TABLET | Freq: Once | ORAL | Status: AC
Start: 2021-10-12 — End: 2021-10-12
  Administered 2021-10-12: 1000 mg via ORAL
  Filled 2021-10-12: qty 2

## 2021-10-12 MED ORDER — OXYCODONE HCL ER 10 MG PO T12A
10.00 mg | EXTENDED_RELEASE_TABLET | Freq: Once | ORAL | Status: AC
Start: 2021-10-12 — End: 2021-10-12
  Administered 2021-10-12: 10 mg via ORAL
  Filled 2021-10-12: qty 1

## 2021-10-12 MED ORDER — MIDAZOLAM HCL 2 MG/2 ML IJ SOLN
INTRAMUSCULAR | Status: AC
Start: 2021-10-12 — End: 2021-10-12
  Filled 2021-10-12: qty 2

## 2021-10-12 MED ORDER — PROPOFOL 200 MG/20 ML IV - AN
Freq: Once | INTRAVENOUS | Status: DC | PRN
Start: 2021-10-12 — End: 2021-10-13
  Administered 2021-10-12: 180 mg via INTRAVENOUS

## 2021-10-12 MED ORDER — CELECOXIB 100 MG PO CAPS
200.00 mg | ORAL_CAPSULE | Freq: Once | ORAL | Status: AC
Start: 2021-10-12 — End: 2021-10-12
  Administered 2021-10-12: 200 mg via ORAL
  Filled 2021-10-12: qty 2

## 2021-10-12 MED ORDER — ASPIRIN 81 MG PO CHEW
81.0000 mg | CHEWABLE_TABLET | Freq: Two times a day (BID) | ORAL | Status: DC
Start: 2021-10-12 — End: 2021-10-14
  Administered 2021-10-12 – 2021-10-14 (×4): 81 mg via ORAL
  Filled 2021-10-12 (×4): qty 1

## 2021-10-12 MED ORDER — SUGAMMADEX SODIUM 200 MG/2ML IV SOLN
INTRAVENOUS | Status: AC
Start: 2021-10-12 — End: 2021-10-12
  Filled 2021-10-12: qty 2

## 2021-10-12 MED ORDER — ONDANSETRON HCL 4 MG/2ML IJ SOLN
INTRAMUSCULAR | Status: AC
Start: 2021-10-12 — End: 2021-10-12
  Filled 2021-10-12: qty 2

## 2021-10-12 MED ORDER — ROCURONIUM BROMIDE 100 MG/10ML IV SOLN
Freq: Once | INTRAVENOUS | Status: DC | PRN
Start: 2021-10-12 — End: 2021-10-13
  Administered 2021-10-12: 40 mg via INTRAVENOUS

## 2021-10-12 MED ORDER — HYDROMORPHONE HCL 2 MG/ML IJ SOLN (SUPER ERX)
Status: AC
Start: 2021-10-12 — End: 2021-10-12
  Filled 2021-10-12: qty 1

## 2021-10-12 MED ORDER — FENTANYL CITRATE 0.05 MG/ML IJ SOLN
INTRAMUSCULAR | Status: AC
Start: 2021-10-12 — End: 2021-10-12
  Filled 2021-10-12: qty 2

## 2021-10-12 MED ORDER — ONDANSETRON HCL 4 MG/2ML IJ SOLN
4.0000 mg | Freq: Once | INTRAMUSCULAR | Status: DC | PRN
Start: 2021-10-12 — End: 2021-10-12

## 2021-10-12 MED ORDER — LABETALOL HCL 5 MG/ML IV SOLN
INTRAVENOUS | Status: AC
Start: 2021-10-12 — End: 2021-10-12
  Filled 2021-10-12: qty 20

## 2021-10-12 MED ORDER — TRAZODONE HCL 50 MG PO TABS
50.0000 mg | ORAL_TABLET | Freq: Every evening | ORAL | Status: DC
Start: 2021-10-12 — End: 2021-10-14
  Administered 2021-10-12 – 2021-10-13 (×2): 50 mg via ORAL
  Filled 2021-10-12 (×2): qty 1

## 2021-10-12 MED ORDER — DEXAMETHASONE SODIUM PHOSPHATE 4 MG/ML IJ SOLN
Freq: Once | INTRAMUSCULAR | Status: DC | PRN
Start: 2021-10-12 — End: 2021-10-13
  Administered 2021-10-12: 8 mg via INTRAVENOUS

## 2021-10-12 MED ORDER — MORPHINE SULFATE 2 MG/ML IV SOLN (SUPER ERX)
2.0000 mg | Status: DC | PRN
Start: 2021-10-12 — End: 2021-10-14
  Administered 2021-10-14 (×2): 2 mg via INTRAVENOUS
  Filled 2021-10-12 (×2): qty 1

## 2021-10-12 MED ORDER — LACTATED RINGERS IV SOLN
INTRAVENOUS | Status: DC
Start: 2021-10-12 — End: 2021-10-12
  Administered 2021-10-12 (×2): 1000 mL via INTRAVENOUS

## 2021-10-12 MED ORDER — LABETALOL HCL 5 MG/ML IV SOLN
Freq: Once | INTRAVENOUS | Status: DC | PRN
Start: 2021-10-12 — End: 2021-10-13
  Administered 2021-10-12: 5 mg via INTRAVENOUS

## 2021-10-12 MED ORDER — FENTANYL CITRATE 0.05 MG/ML IJ SOLN
25.0000 ug | INTRAMUSCULAR | Status: AC | PRN
Start: 2021-10-12 — End: 2021-10-12
  Administered 2021-10-12 (×4): 25 ug via INTRAVENOUS
  Filled 2021-10-12: qty 2

## 2021-10-12 MED ORDER — MIDAZOLAM HCL 2 MG/2 ML IJ SOLN
Freq: Once | INTRAMUSCULAR | Status: DC | PRN
Start: 2021-10-12 — End: 2021-10-13
  Administered 2021-10-12: 2 mg via INTRAVENOUS

## 2021-10-12 MED ORDER — DEXAMETHASONE SODIUM PHOSPHATE 20 MG/5ML IJ SOLN
INTRAMUSCULAR | Status: AC
Start: 2021-10-12 — End: 2021-10-12
  Filled 2021-10-12: qty 5

## 2021-10-12 MED ORDER — OXYCODONE HCL ER 10 MG PO T12A
10.0000 mg | EXTENDED_RELEASE_TABLET | Freq: Two times a day (BID) | ORAL | Status: AC
Start: 2021-10-12 — End: 2021-10-13
  Administered 2021-10-12 – 2021-10-13 (×2): 10 mg via ORAL
  Filled 2021-10-12 (×2): qty 1

## 2021-10-12 MED ORDER — ROPIV-EPI-CLONIDINE-KETOROLAC 123-0.25-0.04- 15 MG/50ML PA SOSY
50.00 mL | PREFILLED_SYRINGE | Freq: Once | PERIARTICULAR | Status: AC
Start: 2021-10-12 — End: 2021-10-12
  Administered 2021-10-12: 50 mL
  Filled 2021-10-12: qty 50

## 2021-10-12 MED ORDER — SENNOSIDES 8.6 MG PO TABS
17.2000 mg | ORAL_TABLET | Freq: Two times a day (BID) | ORAL | Status: DC
Start: 2021-10-12 — End: 2021-10-14
  Administered 2021-10-12 – 2021-10-14 (×4): 17.2 mg via ORAL
  Filled 2021-10-12 (×4): qty 2

## 2021-10-12 MED ORDER — ROPIVACAINE HCL 5 MG/ML IJ SOLN
INTRAMUSCULAR | Status: AC
Start: 2021-10-12 — End: 2021-10-12
  Filled 2021-10-12: qty 30

## 2021-10-12 MED ORDER — DOCUSATE SODIUM 100 MG PO CAPS
100.0000 mg | ORAL_CAPSULE | Freq: Three times a day (TID) | ORAL | Status: DC
Start: 2021-10-12 — End: 2021-10-14
  Administered 2021-10-12 – 2021-10-14 (×6): 100 mg via ORAL
  Filled 2021-10-12 (×6): qty 1

## 2021-10-12 MED ORDER — LIDOCAINE HCL (PF) 2 % IJ SOLN
INTRAMUSCULAR | Status: AC
Start: 2021-10-12 — End: 2021-10-12
  Filled 2021-10-12: qty 5

## 2021-10-12 MED ORDER — CELECOXIB 100 MG PO CAPS
200.0000 mg | ORAL_CAPSULE | Freq: Every day | ORAL | Status: DC
Start: 2021-10-13 — End: 2021-10-14
  Administered 2021-10-13 – 2021-10-14 (×2): 200 mg via ORAL
  Filled 2021-10-12 (×2): qty 2

## 2021-10-12 MED ORDER — ONDANSETRON HCL 4 MG/2ML IJ SOLN
Freq: Once | INTRAMUSCULAR | Status: DC | PRN
Start: 2021-10-12 — End: 2021-10-13
  Administered 2021-10-12: 4 mg via INTRAVENOUS

## 2021-10-12 MED ORDER — SUGAMMADEX SODIUM 200 MG/2ML IV SOLN
Freq: Once | INTRAVENOUS | Status: DC | PRN
Start: 2021-10-12 — End: 2021-10-13
  Administered 2021-10-12: 140 mg via INTRAVENOUS

## 2021-10-12 MED ORDER — CEFAZOLIN IN SODIUM CHLORIDE 3-0.9 GM/100ML-% IV SOLN
3.00 g | Freq: Once | INTRAVENOUS | Status: AC
Start: 2021-10-12 — End: 2021-10-12
  Administered 2021-10-12: 3 g via INTRAVENOUS
  Filled 2021-10-12: qty 100

## 2021-10-12 SURGICAL SUPPLY — 36 items
BACK TABLE COVER 44X76 (DRAPE) ×4 IMPLANT
BASIC SINGLE BASIN TRAY (BASIN) ×2 IMPLANT
CHLORAPREP APPLICAOR 26ML ORGN (PREP) ×4 IMPLANT
CRYO CUFF KNEE LARGE (THERAPY) ×2 IMPLANT
DRAPE FOR TWO TIER OR TABLE (FURN) ×2 IMPLANT
DRSG,OPTIFOAM 4X10" MSC97410 (DRESSING) ×2 IMPLANT
EZE-BAND BANDAGE 6IN VELCRO (BANDAGE) ×2 IMPLANT
FEM COMPONENT 2 LT KNEE CEMNTD ×2 IMPLANT
FLYTE TOGA GOWN ZIPPER 2XL (SURGPPE) ×6 IMPLANT
GOWN FLYTE TOGA 2XL PEEL AWAY (SURGPPE) ×6 IMPLANT
HEADLESS FLUTED STR PIN 1/8 ×2 IMPLANT
HEAVY DUTY EXTREMITY DRAPE (DRAPE) ×2 IMPLANT
IRRISEPT WOUND DEBRIDEMENT (WOUND) ×2 IMPLANT
PACK TOTAL JOINT (PACK) ×2 IMPLANT
PALABOWL MIXING BOWL CEMENT (SURGCMT) IMPLANT
PALACOS RADIOPAQUE BONE CEMENT ×2 IMPLANT
PATELLA ×2 IMPLANT
PLUMEPEN ELITE SMOKE EVAC (SMKEVAC) ×2 IMPLANT
SAGITTAL BLADE 18MMX90MM (SURGPWR) ×2 IMPLANT
SPONGE LAP,XRAY,RFID (SURGSPG) ×6 IMPLANT
STERILE LIGHTHANDLE COVER (SURGEQUP) ×8 IMPLANT
STRATAFIX 1 PDS PLUS (SUTURE) ×2 IMPLANT
STRATAFIX 2-0/TS 3-0 PGA/PCL2 (SUTURE) ×2 IMPLANT
SURGEON BLADE #10 (SURGBLA) ×12 IMPLANT
SURGEON BLADE #15 (SURGBLA) ×4 IMPLANT
SURGEON GLOVE LF/PF 7 STER (GLOVE) ×4 IMPLANT
SURGEON GLOVE PF/LF 6.5 STER (GLOVE) ×8 IMPLANT
SUTURE VIC PLUS 0 UND CT-1 27 (SUTURE) ×1
SUTURE VIC PLUS 2-0 UND SH 27I (SUTURE) ×2
SUTURE VIC PLUS 3-0 UND SH 27I (SUTURE) ×2
SUTURE VICRYL PLU 2-0 SH 27IN (SUTURE) ×2 IMPLANT
SUTURE VICRYL PLU 3-0 SH 27IN (SUTURE) ×2 IMPLANT
SUTURE VICRYL PLUS 0 CT1 27IN (SUTURE) ×1 IMPLANT
TIBIAL BASEPLATE 5520B200 ×2 IMPLANT
TOURNIQUET DISPOSABLE 30IN (SURGPNE) ×2 IMPLANT
TRIATHLON CS INSERT #2 11MM ×2 IMPLANT

## 2021-10-12 NOTE — Plan of Care (Signed)
Problem: Safety  Goal: Free from accidental physical injury  Outcome: Progressing  Goal: Free from intentional harm  Outcome: Progressing   Received at bedside from PACU, oriented to room, call bell within reach   Problem: Pain  Goal: Patient's pain/discomfort is manageable  Description: Assess and monitor patient's pain using appropriate pain scale. Collaborate with interdisciplinary team and initiate plan and interventions as ordered. Re-assess patient's pain level 30 - 60 minutes after pain management intervention.   Outcome: Progressing   Patient c/o pain to LLE, cyrocuff in place, repositioned     A&Ox4, primarily [place] speaking, able to communicate needs. IVF D5 1/2NS@125mL /hr. Dsg to LLE c/d/I, neuros per mar, cyrocuff in place, venadyne to RLE. Tolerating PO. Safety maintained, will continue to monitor.

## 2021-10-12 NOTE — Op Note (Signed)
DATE OF SURGERY: 10/12/2021     PREOPERATIVE DIAGNOSIS: Osteoarthritis, left knee.     POSTOPERATIVE DIAGNOSIS: Osteoarthritis, left knee.     PROCEDURE:  Left total knee replacement.     SURGEON: Deeann Saint, MD.     ASSISTANT: Tobey Grim, PGY-2, Bruce Ramphal, HMS II     ANESTHESIA: General plus adductor canal block.     IMPLANTS: Stryker Triathlon size 2 cruciate retaining femur, size 2 tibial baseplate, 11-mm polyethylene insert, 29A patella button, Zimmer Palacos cement.      INDICATIONS: The patient is a 61 year old female who has a long history of left knee pain. X-rays show significant degenerative joint changes with bone on bone medially. Patient completed all requirements for a total joint replacement. She presents today for left knee surgery.     DESCRIPTION OF PROCEDURE: The patient was identified as the correct patient in the preop holding area. She identified her left knee as the correct site, and I marked the knee. The anesthesia department placed an adductor canal block. She was brought into the operating room where an anesthesia time out was performed and general anesthesia was induced. There was a Venodyne around her opposite calf. A tourniquet was placed around the upper thigh.  She received three grams of Kefzol as well as tranexamic acid.  The left leg was prepped and draped in a sterile manner. A surgical checklist was performed.     I made a midline incision. I performed a medial arthrotomy using a mini mid vastus technique. She had a large effusion.  Bleeding points were coagulated.  She had a lot of bleeders in the fat pad so I decided to put up the tourniquet.  After elevation of the leg,the tourniquet was put up to 250 mm of Hg.  I flexed the knee and cleared the soft tissue medially and laterally on the tibia. The patient had marked wear throughout the knee with bare bone medially.     I drilled the intramedullary canal of the femur. I first removed the ACL to allow the tibia to come  forward a little bit. I then set the guide to remove 8 mm of bone in 6 degrees of valgus. I pinned the guide in place and made the cut.       I then cut the tibia using the extramedullary guide. I lined it up carefully with the tibial shaft. I adjusted the guide to remove a couple millimeters from the worn medial side. I pinned the tray in place and made the cut.  I removed the cut piece of bone and some of the menisci. The gap balancer demonstrated an adequate resection had been performed.  I then placed the femoral sizing block. The femur sized to a size 2. We pinned the cutting block in place, made the anterior, posterior, and chamfer cuts. 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     I then sized the tibia. I removed the rest of the menisci. I placed a size 2 tray and it fit well. We then trialed the knee with a size 2 tibial tray, 9-mm poly and the size 2 femur.       I subluxed the patella, which measured about 22 mm. I used freehand technique to remove 10 mm of bone.  We then sized it to a 29. We drilled the lug holes and placed the trial. The patella tracked well but had a tiny amount of lateral pull with full flexion despite external  rotation of the femur and tibia. I drilled the lug holes for the femur, then pinned the tibial tray in place and I punched the keel. We placed a bone block in the femoral canal.     We changed our gloves and obtained the actual components. We pulse lavaged the knee. We then dried off the bone and cemented the tibia, femur, and patella into place. I placed a 9-mm poly and held the knee with pressure as the cement hardened. We removed excess cement with curettes.     We then trialed an 11 which fit well with the knee coming to full extension and stable.  I obtained the actual polyethylene insert. We pulse lavaged the knee. We then snapped the tray in place.  We then released the tourniquet after 68 minutes.     I then pulse lavaged the knee again. I injected the  multimodal injection. I then flexed the knee slightly on a bump and closed the arthrotomy with #1 Stratafix with 0-0 Vicryl closing the muscle fascia. The skin was closed with 2-0 and 3-0 Vicryl, followed by a 3-0 Stratafix subcuticular stitch. Mastisol and Steri-Strips were applied followed by an Optifoam dressing. The patient leg was wrapped with and ACE with the Cryo/Cuff in place. EBL was 30 cc. She was awoken from anesthesia and transferred to the recovery room in stable condition.         Deeann Saint, MD, 10/12/2021

## 2021-10-12 NOTE — Anesthesia Procedure Notes (Signed)
Peripheral Block    Patient location during procedure: pre-op  Start time: 10/12/2021 2:14 PM  End time: 10/12/2021 2:24 PM  Reason for block: at surgeon's request and post-op pain management  Staffing  Anesthesiologist: Berna Bue, MD  Performed: anesthesiologist   Preanesthetic Checklist  Completed: patient identified, site marked, surgical consent, pre-op evaluation, timeout performed, IV checked, risks and benefits discussed and monitors and equipment checked  Peripheral Block  Patient position: supine  Prep: ChloraPrep  Patient monitoring: heart rate, cardiac monitor and continuous pulse ox  Block type: adductor canal  Laterality: left  Injection technique: single-shot  Procedures: ultrasound guided  Needle  Needle gauge: 22 G  Needle length: 80 mm  Needle localization: ultrasound guidance  Needle insertion depth: 4 cm  Assessment  Injection assessment: negative aspiration for heme, no paresthesia on injection, incremental injection and local visualized surrounding nerve on ultrasound  Paresthesia pain: none  Heart rate change: no  Slow fractionated injection: yes  Medications Administered  ropivacaine 0.5 % - Infiltration   30 mL - 10/12/2021 2:18:00 PM  Medication administered at:

## 2021-10-12 NOTE — Interval H&P Note (Signed)
Patient Assessment Update: (Fill out Prior to procedure or within 24 hours of  admission if H&P done pre-admission.)   Re-evaluation including history review and physical examination has been performed.    Patient's Condition No Change.  Used wipes.  MRSA/MSSA neg.  Had ERAS drink.    Deeann Saint, MD, 10/12/2021

## 2021-10-12 NOTE — Plan of Care (Signed)
Problem: Daily Care  Goal: Daily care needs are met  Description: Assess and monitor ability to perform self care and identify potential discharge needs.  Outcome: Progressing   Rec'd patient in bed with family at bedside. States pain to L-knee is 9/10. Rec'd scheduled pain meds and cryocuff remains in place. Is able to converse with family and appears comfortable with no grimacing when resting in bed. Will continue to monitor.

## 2021-10-13 LAB — BASIC METABOLIC PANEL
ANION GAP: 10 mmol/L (ref 10–22)
BUN (UREA NITROGEN): 9 mg/dL (ref 7–18)
CALCIUM: 9 mg/dL (ref 8.5–10.5)
CARBON DIOXIDE: 22 mmol/L (ref 21–32)
CHLORIDE: 104 mmol/L (ref 98–107)
CREATININE: 0.6 mg/dL (ref 0.4–1.2)
ESTIMATED GLOMERULAR FILT RATE: >60 mL/min (ref >60)
Glucose Random: 121 mg/dL (ref 74–160)
POTASSIUM: 4.1 mmol/L (ref 3.5–5.1)
SODIUM: 137 mmol/L (ref 136–145)

## 2021-10-13 LAB — CBC WITH PLATELET
ABSOLUTE NRBC COUNT: 0 10*3/uL (ref 0.0–0.0)
HEMATOCRIT: 28.5 % — ABNORMAL LOW (ref 34.1–44.9)
HEMOGLOBIN: 9.4 g/dL — ABNORMAL LOW (ref 11.2–15.7)
MEAN CORP HGB CONC: 33 g/dL (ref 31.0–37.0)
MEAN CORPUSCULAR HGB: 32.2 pg (ref 26.0–34.0)
MEAN CORPUSCULAR VOL: 97.6 fl (ref 80.0–100.0)
MEAN PLATELET VOLUME: 10.9 fL (ref 8.7–12.5)
NRBC %: 0 % (ref 0.0–0.0)
PLATELET COUNT: 204 10*3/uL (ref 150–400)
RBC DISTRIBUTION WIDTH STD DEV: 45.5 fL (ref 35.1–46.3)
RED BLOOD CELL COUNT: 2.92 M/uL — ABNORMAL LOW (ref 3.90–5.20)
WHITE BLOOD CELL COUNT: 13.8 10*3/uL — ABNORMAL HIGH (ref 4.0–11.0)

## 2021-10-13 LAB — PACKED RED BLOOD CELLS: BBK BLOOD TYPE: 600

## 2021-10-13 NOTE — Plan of Care (Signed)
Problem: Pain  Goal: Patient's pain/discomfort is manageable  Description: Assess and monitor patient's pain using appropriate pain scale. Collaborate with interdisciplinary team and initiate plan and interventions as ordered. Re-assess patient's pain level 30 - 60 minutes after pain management intervention.   Outcome: Progressing  Pt denied n/v/cp/sob, stated left knee surgical site pain 8/10 given schedule and prn meds with good effect. Surgical dressing DCI, + CSM,cry cuff and sequential compression on when pt in bed. Fall precaution in place.

## 2021-10-13 NOTE — Progress Notes (Signed)
S: I have more knee pain now than this morning.  States medicated hour before my arrival.   O: Please see Vital Sign and Pain Assessment flowsheets for vitals and pain documentation.  Plan of care has been reviewed.   10/13/21 1409   Language Information   Language of Care Portuguese   Interpreter Yes   Name/ID [email or web address]   Rehab Discipline   Rehab Discipline PT   Weight Bearing Status   RLE FWB   LLE WBAT   RUE FWB   LUE FWB   Mobility / Balance   Mobility / Balance Yes   Bed Mobility   Supine to Sit Min assist to right 1;Min verbal cues   Sit to Supine Min assist to left 1  (of LLE)   Transfers   Transfer Yes   Transfer 1   Transfer From 1 Bed   Transfer Type 1 To and from   Transfer to 1 Stand   Technique 1 Sit to stand;Stand to sit   Transfer Device 1 Rolling walker (Front wheel walker)   Transfer Level of Assistance 1 Contact guard   Gait   Gait Yes   Gait 1   Assistive Device 1 Rolling walker (Front wheel walker)   Pattern 1 L Decreased stance time;L Decreased heel strike;Decreased stride length   Gait Assistance 1 Close supervision   Distance (Ft) 1 100 Feet   Balance   Sitting - Static Sits without support for > 30 sec   Sitting - Dynamic Moves/returns truncal midpoint greater than 2 inches in all planes   Standing - Static Bilateral upper extremity supported;Able to maintain 60 sec   Standing - Dynamic Forward lean;Lateral lean   Posture Rounded shoulders   Safety Devices   Type of Devices Call bell in place;Bed alarm in place   Plan   Prognosis Excellent   PT Frequency Split session   Recommendation   Recommendation Outpatient PT   Equipment Recommended Rolling walker (Front wheel walker)       A:  Pt in bed on arrival.  C/O pain in L knee with movement. Required assist to EOB.  Sit<>stand with rolling walker with CG.  Ambulated 100' with difficulty advancing RLE. Slow antalgic gait.  Declined stairs. In bed cryo cuff place. RN notified of knee pain.   P: Continue per PT Care Plan.   Monia Pouch, PTA, Lic  # [id number]

## 2021-10-13 NOTE — Initial Assessments (Signed)
Inpatient Physical Therapy Initial Evaluation    S: "I'll see how the pain is when I get up"    O: PT initial evaluation completed today. Please see Vital Sign and Pain Assessment flowsheets for vitals and pain documentation.  Plan of care has been reviewed and updated as necessary.     10/13/21 0845   Language Information   Language of Care Portuguese   Other: English   Interpreter Yes   Name/ID iPad   Evaluation Type   Evaluation Type Initial Evaluation   Rehab Discipline   Rehab Discipline PT   Safety Devices   Type of Devices Call bell in place   Weight Bearing Status   RLE FWB   LLE WBAT   RUE FWB   LUE FWB   Services prior to admission?   Type of Home Care Services None   Premorbid Mobility   Transfers Independent   Walking Independent;Community distances   Walking assistive devices used Straight cane   Stair negotiation Independent   ADL / IADL Baseline Status   ADL/IADL Baseline Status Yes   ADL's/IADL's   Dressing Independent   Bathing Independent   Architectural technologist Independent   Living Situation   Living Setting Apartment   Lives With [place] Access Steps;Bilateral rails   Stairs needed to access Bedroom;Bathroom;Living area;Kitchen   Strengths   Strengths Premorbid level of function;Support of immediate family   Barriers   Barriers Home design   Sensation   Light Touch No apparent deficits   RLE Assessment   RLE Assessment WFL   LLE Assessment   LLE Assessment X   Strength LLE   Overall Strength defcits, 3/5   Mobility / Balance   Mobility / Balance Yes   Bed Mobility   Supine to Sit Min assist to right 1   Sit to Supine Independent   Transfers   Transfer Yes   Transfer 1   Transfer From 1 Bed   Transfer Type 1 To and from   Transfer to 1 Stand   Technique 1 Sit to stand;Stand to sit   Transfer Device 1 Rolling walker (Front wheel walker)   Transfer Level of Assistance 1 Supervision   Trials/Comments 1 x1   Transfers 2   Transfer From 2 Standard toilet with grab bars    Transfer Type 2 To and from   Transfer to 2 Stand   Technique 2 Sit to stand;Stand to sit   Transfer Device 2 Rolling walker (Front wheel walker)   Transfer Level of Assistance 2 Supervision   Trials/Comments 2 x1   Gait   Gait Yes   Gait 1   Assistive Device 1 Rolling walker (Front wheel walker)   Pattern 1 L Decreased stance time;L Decreased heel strike;Decreased stride length   Gait Assistance 1 Close supervision   Distance (Ft) 1 150 Feet   Balance   Sitting - Static Sits without support for > 30 sec   Sitting - Dynamic Moves/returns truncal midpoint greater than 2 inches in all planes   Standing - Static Bilateral upper extremity supported;Able to maintain 60 sec   Standing - Dynamic Forward lean;Lateral lean   Plan   Prognosis Excellent   PT Frequency Split session   Recommendation   Recommendation Outpatient PT   Equipment Recommended Rolling walker (Front wheel walker)      10/13/21 0855 10/13/21 0900 10/13/21 0905   Vital Signs   BP 118/70 123/72 131/82  Patient Position Lying Sitting Standing     A: Pt is a 61 year old female with PMH including HTN, chronic shoulder pain, left knee OA admitted for L TKA, POD#1. PT consult received, chart reviewed. Pt lives alone in an apartment with ~12 STE and bilateral rails. She was independent prior to surgery with all mobility using SPC, ADLs, and IADLs. Her daughter is coming to stay with her upon discharge.    On evaluation, pt demo's the ability to lift LLE against gravity during bed mobility, uses writer's hand as grab bar for trunk management. Sit to stand transfers throughout session completed with supervision. Pt able to ambulate ~150' close supervision on unit, antalgic gait, decreased stance time on LLE noted. Declines attempts at stair negotiation d/t fatigue but agreeable to attempt this afternoon. Patient presents below functional baseline and would benefit from skilled PT to address mobility impairments and functional limitations.    PT Short Term  Goals:  Patient will perform bed mobility independently in 2 treatments.  Patient will perform functional transfers independently in 2 treatments.  Patient will ambulate 200 feet independently with rolling walker (front wheel walker) in 2 treatments.  Patient will ascend and descend 12 steps with one rail right with supervision in 3 treatments.    PT Long Term Goals:  STG = LTG    P: PT 7 times a week, split session, while inpatient for transfer training, gait training, and therapeutic exercise as tolerated. Recommend outpatient PT upon d/c pending patient's ability to safely complete stairs.    Kassondra Geil C. Fransisca Connors, PT, Lic # [id number]

## 2021-10-13 NOTE — Progress Notes (Addendum)
Discussed during MDR and met pt at bedside using [place] interpreter services. Pt is a 61 y/o female admitted for an elective L TKA. Pt is A&Ox4, lives alone in an apartment, 2nd floor walk up, independent with adls, uses a cane for ambulation. PT eval done recommending Outpatient PT, 2nd session will be done this afternoon. Pt states she already one scheduled next week for outpatient PT. According to pt, her dtr will be arriving from [place] tomorrow to stay with her while she is recovering. Anticipate discharge tomorrow, pt's brother will provide her ride home.    10/13/21 1152   Admission   Reviewed for admission Yes   Observation Notice delivered to patient No   Does patient have prescription drug coverage? Yes   Reason for Admission L TKA   Readmission Assessment  No   Mental Status Upon Admission   Mental Status Upon Admission AO   Demographics   Demographic Information Correct Yes   Patient Status   Is the patient own decision-maker? Yes   Is a Social Work consult needed for [place] Proxy or Guardianship? No   Care Giver   Do you have a caregiver?  No   Psychosocial   Admitted From: [place] no services   Risk Factors Adjustment to diagnosis/injury/illness   Relationship Status Divorced   Functional Screen   * Level of Function on Admission Independent     Living Situation   Lives With Alone   Living Setting Apartment;Second floor walk-up   Anticipated Discharge Plan   Expected Discharge Date 10/14/21   Anticipated Outcome Home With Family   Transportation at Discharge Family   Patient expects to be discharged to: Home   Interpreter Requested Yes   Language Interpreter Requested For [place]

## 2021-10-13 NOTE — Anesthesia Postprocedure Evaluation (Signed)
Anesthesia Post-Operative Evaluation Note    Patient: Stephanie [name]           Procedure Summary     Date: 10/12/21 Room / Location: [facility] [facility] / [facility]    Anesthesia Start: 3568 Anesthesia Stop: 6168    Procedure: ARTHROPLASTY, KNEE, TOTAL (Left: Knee) Diagnosis:       Primary osteoarthritis of left knee      (Primary osteoarthritis of left knee [M17.12])    Surgeons: Deeann Saint, MD Responsible Provider: Berna Bue, MD    Anesthesia Type: general, regional ASA Status: 3            POST-OPERATIVE EVALUATION    Anesthesia Post Evaluation    Vitals signs in patient's normal range: Yes  Respiratory function stable; airway patent: Yes  Cardiovascular function stable: Yes  Hydration status stable: Yes  Mental status recovered; patient participates in evaluation and/or is at baseline: Yes  Pain control satisfactory: Yes  Nausea and vomiting control satisfactory: Yes      PostOP disposition PACU  Anesthesia Observation no significant observation      Last vitals    BP: 119/68 (10/13/2021  1:25 PM)  Temp: 98.2 F (36.8 C) (10/13/2021  1:25 PM)  Pulse: 94 (10/13/2021  1:25 PM)  Resp: 18 (10/13/2021  1:25 PM)  SpO2: 93 % (10/13/2021  1:25 PM)

## 2021-10-13 NOTE — Discharge Instructions (Addendum)
-   You had a left total knee replacement with Dr. Madelaine Etienne.  - You may put as much weight on your operative leg as you can tolerate. Use your walker to help you walk.   - Keep your incision covered. The Optifoam dressing should be replaced with a new Optifoam 1 week after your surgery 2/20.  If the dressing comes off accidentally, cover the incision with gauze. Do not get the incision wet.   - You may shower with Optifoam dressing in place. Do not submerge the dressing, for example in a bath. Keep the dressing clean and dry.  - You will be on Aspirin, a blood thinner, for 4 weeks postop to prevent blood clots. Your last dose will be on 3/13.  - You have been given a prescription for narcotic pain medication. Please take as directed and taper down use as tolerated. Drink plenty of water and take Colace (stool softener) to help prevent constipation. You may take Senna (laxative) nightly as well.  - Attend your physical therapy and orthopaedic follow-up appointments.  - If you need a refill of your pain medication, please call 48 hours in advance. All requests for medication refills will be addressed within 48 hours. No prescription refills will be written after hours (5pm-8:30am) or on the weekend.  - Please call the orthopaedic clinic at [phone] if you have increased pain, numbness/tingling, drainage from your incision, fevers, chills, nausea/vomiting, if you have a fall, or have any other questions/concerns. Please go to the nearest emergency room if you experience chest pain or shortness of breath.

## 2021-10-13 NOTE — Progress Notes (Signed)
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 Patient denies fever or subjective chills. Patient remains afebrile with all other vital signs stable and within normal limits today. Denies chest pain or shortness of breath; using incentive spirometer as instructed. Tolerating PO food and drink without difficulty; denies nausea, vomiting, abdominal pain. Denies numbness or tingling distally. Denies calf pain or swelling bilaterally. Wants to go home tomorrow because that's when her daughter will be there.     O:  Scheduled Medications:   pantoprazole  40 mg Oral Daily    docusate sodium  100 mg Oral TID    sennosides  17.2 mg Oral BID    celecoxib  200 mg Oral Daily    acetaminophen  1,000 mg Oral Q8H [place]    aspirin  81 mg Oral BID    losartan  25 mg Oral Daily    DULoxetine  60 mg Oral Daily    traZODone  50 mg Oral Nightly       Vitals:   Patient Vitals for the past 24 hrs:   BP Temp Temp src Pulse Resp SpO2 Height Weight   10/13/21 0957 121/78 -- -- -- -- -- -- --   10/13/21 0956 121/78 -- -- -- -- -- -- --   10/13/21 0918 121/78 98.2 F (36.8 C) Oral 84 18 93 % -- --   10/13/21 0905 131/82 -- -- -- -- -- -- --   10/13/21 0900 123/72 -- -- -- -- -- -- --   10/13/21 0855 118/70 -- -- -- -- -- -- --   10/13/21 0510 108/68 98.4 F (36.9 C) Oral 75 18 95 % -- --   10/13/21 0126 97/56 98.8 F (37.1 C) Oral 87 20 92 % -- --   10/12/21 2037 110/69 97.6 F (36.4 C) Oral 93 20 94 % -- --   10/12/21 1800 -- -- -- 91 15 95 % -- --   10/12/21 1745 (!) 122/93 97.6 F (36.4 C) TEMPORAL 86 14 97 % -- --   10/12/21 1730 133/81 -- -- 85 16 91 % -- --   10/12/21 1725 134/86 -- -- 84 21 96 % -- --   10/12/21 1720 134/85 97.3 F (36.3 C) TEMPORAL 83 20 97 % -- --   10/12/21 1428 131/87 -- -- 82 24 99 % -- --   10/12/21 1330 143/86 -- -- 78 28 98 % -- --   10/12/21 1258 143/86 -- -- -- -- -- -- --   10/12/21 1225 -- -- -- -- -- -- 4\' 11"  (1.499 m) 74.8  kg (165 lb)   10/12/21 1141 -- 98 F (36.7 C) TEMPORAL -- -- -- 4\' 11"  (1.499 m) 74.8 kg (165 lb)       Labs:    Recent Labs     10/13/21  0715   NA 137   K 4.1   CL 104   CO2 22   BUN 9   CREAT 0.6   GLUCOSER 121   CA 9.0   WBC 13.8*   HGB 9.4*   HCT 28.5*   PLTA 204       I/O:   I/O 24 Hrs:  In: 1304.2 [P.O.:800; I.V.:404.2; IV Piggyback:100]  Out: 730 [Urine:700; Blood:30]    Physical Examination:   General: lying in bed, NAD, A&Ox3  Lungs: unlabored breathing   Musculoskeletal: Optifoam dressing clean/dry/intact. Overlying skin  is warm, dry, and intact with no erythema, edema, or ecchymosis. Left thigh soft/nontender. Calves soft/nontender with no erythema. Fires AT/GS/EHL. NVI distally. 2+ DP.     Imaging: Left knee films show hardware in good position.     A/P: This is a 61 year old female POD# 1 Left TKA doing very well    # Wound: Optifoam dressing intact, keep in place until POD #7 (2/20) if drainage remains < 50%. New Optifoam dressing placed in patient's chart to be provided with discharge materials.   # Heme: H/H 9.4/28.5; mild acute anemia secondary to post-operative blood loss. Monitor CBC. Patient is asymptomatic; continue to monitor.   # Analgesia: multi-modal pain regimen, oxycodone prn, IV morphine bt, ice frequently.   # Activity: WBAT LLE. Continue working with PT/OT.   # Anticoagulation: Aspirin x 81 bid. Last dose 11/09/21.   # Proph: bowel regimen, encourage IS, venodynes.   # Dispo: home. Follow-up appointment scheduled with S.Chan 10/28/21    Patient was seen independently today and was discussed with orthopedic attending, RN, CM, and PT.     Please contact the orthopedic on call provider listed in [place] with any issues.     Clotilde Dieter, PA-C, 10/13/2021

## 2021-10-13 NOTE — Discharge Summary (Addendum)
Physician Discharge Summary     Patient ID:  Stephanie [name]  [id number]  61 year old  25-May-1961    Admit date: 10/12/21      Discharge date and time: 10/14/21     Admitting Physician: Deeann Saint, MD     Discharge Physician: Deeann Saint, MD    Discharge Diagnoses: left knee osteoarthritis    Admission Condition: fair    Discharged Condition: good    Indication for Admission: total knee replacement     Hospital Course: Patient was brought to the OR for elective left total knee arthroplasty with Dr. Madelaine Etienne. Pre-op nerve block was performed. The procedure went well and there were no intraoperative complications. EBL was 30cc. The patient was brought to the PACU in stable condition with Optifoam dressing, Ace wrap and cryocuff in place. Patient was admitted to the inpatient floor in stable condition.    On POD1, patient did well with PT, but required another day to ensure safe discharge home.    On POD 2, pt cleared for discharge.  Hct 27.8 and stable fromj 28.5 yesterday.    Consults: PT    Significant Diagnostic Studies: labs: WNL and radiology: X-Ray: s/p L TKA, no hardware complication    Treatments: antibiotics: Ancef, analgesia: acetaminophen and oxycodone, anticoagulation: ASA and surgery: s/p L TKA    Discharge Exam:  General: NAD, ANO x3  Chest: Symmetric chest rise  Lungs: Nonlabored breathing  Abdomen: Distended, soft  Musculoskeletal: Focused examination left lower extremity reveals a surgical dressing that is clean, dry, and intact.  The left lower extremity is swollen, but the thigh is soft and compressible.  Motor intact EHL/FHL/GS/TA.  Neurovascularly intact distally LLE    3 disposition: Home with services.    Patient Instructions:   Current Discharge Medication List    START taking these medications    aspirin 81 MG chewable tablet  Take 1 tablet by mouth in the morning and 1 tablet before bedtime.  Qty: 60 tablet Refills: 0    celecoxib (CELEBREX) 200 MG capsule  Take 1 capsule by mouth in  the morning.  Qty: 30 capsule Refills: 0    docusate sodium 100 MG CAPS  Take 100 mg by mouth 2 (two) times daily  for 5 days  Qty: 10 capsule Refills: 0    oxyCODONE (ROXICODONE) 10 MG TABS immediate release tablet  Take 0.5-1 tablets by mouth every 4 (four) hours as needed (moderate to severe pain) Max Daily Amount: 60 mg  for up to 5 days  Qty: 30 tablet Refills: 0    sennosides (SENOKOT) 8.6 MG tablet  Take 1 tablet by mouth in the morning and 1 tablet before bedtime. Do all this for 5 days.  Qty: 10 tablet Refills: 0      CONTINUE these medications which have CHANGED    acetaminophen (TYLENOL) 500 MG tablet  Take 2 tablets by mouth every 8 (eight) hours as needed for Pain (mild pain)  for up to 5 days  Qty: 30 tablet Refills: 0      CONTINUE these medications which have NOT CHANGED    Glucosamine 750 MG TABS  Take by mouth    Omega-3 Fatty Acids (FISH OIL) 1000 MG CAPS  Take by mouth    Turmeric 500 MG CAPS  Take by mouth    dexamethasone (DECADRON) 4 MG tablet  Take 4 mg by mouth in the morning and 4 mg in the evening. Take with meals.  DULoxetine (CYMBALTA) 60 MG capsule  Take 1 capsule by mouth in the morning. Stop taking duloxetine 30mg .  Qty: 30 capsule Refills: 5    losartan (COZAAR) 25 MG tablet  Take 1 tablet by mouth in the morning.  Qty: 90 tablet Refills: 2  Associated Diagnoses:BMI 33.0-33.9,adult; Chronic pain of left knee    omeprazole (PRILOSEC) 40 MG capsule  Take 1 capsule by mouth in the morning.  Qty: 30 capsule Refills: 11    traZODone (DESYREL) 50 MG tablet  Take 1 tablet by mouth nightly  Qty: 90 tablet Refills: 3  Associated Diagnoses:Psychophysiological insomnia      STOP taking these medications    ibuprofen (ADVIL) 600 MG tablet        Activity: activity as tolerated  Diet: advance as tolerated   Wound Care: keep wound clean and dry, reinforce dressing PRN and ice to area for comfort    [place] Future Appointments:  Current and Future Appointments at [facility] (90 Days)  10/14/2021 - 01/12/2022      Date Visit Type Length Department Provider     10/19/2021 10:00 AM PRIORITY THERAPY EVAL 60 60 min [facility] [[id number]] Gladstone Pih, PT    Patient Instructions:                   10/28/2021 11:00 AM ORTHO POST OP 15 15 min [facility] [[id number]] Ronette Deter, [place]    Patient Instructions:                   11/19/2021  2:00 PM OFF30 30 min [facility] [[id number]] Minda Meo, MD    Patient Instructions:                        Follow-up with:  Follow-up Information    None       Code Status during hospital stay: Full Code      Signed:  Tonita Phoenix, PA-C  10/14/2021  1:15 PM     I agree with this discharge summary.  Patient did well immediately post op after total knee replacement.  Patient discharged home with services.  Daughter has come to stay with her mom during her recovery.    Deeann Saint, MD, 10/14/2021

## 2021-10-14 LAB — CBC WITH PLATELET
ABSOLUTE NRBC COUNT: 0 10*3/uL (ref 0.0–0.0)
HEMATOCRIT: 27.8 % — ABNORMAL LOW (ref 34.1–44.9)
HEMOGLOBIN: 8.8 g/dL — ABNORMAL LOW (ref 11.2–15.7)
MEAN CORP HGB CONC: 31.7 g/dL (ref 31.0–37.0)
MEAN CORPUSCULAR HGB: 31.3 pg (ref 26.0–34.0)
MEAN CORPUSCULAR VOL: 98.9 fl (ref 80.0–100.0)
MEAN PLATELET VOLUME: 10.9 fL (ref 8.7–12.5)
NRBC %: 0 % (ref 0.0–0.0)
PLATELET COUNT: 197 10*3/uL (ref 150–400)
RBC DISTRIBUTION WIDTH STD DEV: 48.1 fL — ABNORMAL HIGH (ref 35.1–46.3)
RED BLOOD CELL COUNT: 2.81 M/uL — ABNORMAL LOW (ref 3.90–5.20)
WHITE BLOOD CELL COUNT: 10.5 10*3/uL (ref 4.0–11.0)

## 2021-10-14 LAB — TYPE AND SCREEN
ABO/RH INTERPRETATION: A NEG
ANTIBODY SCREEN SOLID PHASE: NEGATIVE

## 2021-10-14 LAB — PACKED RED BLOOD CELLS

## 2021-10-14 LAB — HOLD RED TOP TUBE

## 2021-10-14 MED ORDER — SENNOSIDES 8.6 MG PO TABS
8.60 mg | ORAL_TABLET | Freq: Two times a day (BID) | ORAL | 0 refills | Status: AC
Start: 2021-10-14 — End: 2021-10-19

## 2021-10-14 MED ORDER — CELECOXIB 200 MG PO CAPS
200.0000 mg | ORAL_CAPSULE | Freq: Every day | ORAL | 0 refills | Status: DC
Start: 2021-10-15 — End: 2021-10-14

## 2021-10-14 MED ORDER — OXYCODONE HCL 10 MG PO TABS
5.0000 mg | ORAL_TABLET | ORAL | 0 refills | Status: DC | PRN
Start: 2021-10-14 — End: 2021-10-21

## 2021-10-14 MED ORDER — ASPIRIN 81 MG PO CHEW
81.00 mg | CHEWABLE_TABLET | Freq: Two times a day (BID) | ORAL | 0 refills | Status: AC
Start: 2021-10-14 — End: 2021-11-13

## 2021-10-14 MED ORDER — ACETAMINOPHEN 500 MG PO TABS
1000.0000 mg | ORAL_TABLET | Freq: Three times a day (TID) | ORAL | 0 refills | Status: DC | PRN
Start: 2021-10-14 — End: 2021-10-21

## 2021-10-14 MED ORDER — DSS 100 MG PO CAPS
100.00 mg | ORAL_CAPSULE | Freq: Two times a day (BID) | ORAL | 0 refills | Status: AC
Start: 2021-10-14 — End: 2021-10-19

## 2021-10-14 MED FILL — ASPIRIN LOW CHW 81MG: 30 days supply | Qty: 60 | Fill #0 | Status: CP

## 2021-10-14 MED FILL — DOCUSATE SOD 100MG: 5 days supply | Qty: 10 | Fill #0 | Status: CP

## 2021-10-14 MED FILL — ACETAMIN 500MG: 5 days supply | Qty: 30 | Fill #0 | Status: CP

## 2021-10-14 MED FILL — SENNA-TIME 8.6MG: 5 days supply | Qty: 10 | Fill #0 | Status: CP

## 2021-10-14 MED FILL — OXYCODONE  10MG: 7 days supply | Qty: 30 | Fill #0 | Status: CP

## 2021-10-14 MED FILL — CELECOXIB 200MG: 30 days supply | Qty: 30 | Fill #0 | Status: CP

## 2021-10-14 NOTE — Discharge Inst - Anticoagulation Mgmt (Addendum)
You are taking a blood thinner. It is called Aspirin 81mg  twice daily.    You should ask your nurse for more information on this medicine if you have any questions.

## 2021-10-14 NOTE — Discharge Inst - Wound Care (Addendum)
Wound or Skin Care: keep dressing in place until 10/19/21. Please change the primary dressing on 10/19/21 (a dressing has been provided for you).

## 2021-10-14 NOTE — Progress Notes (Signed)
S:  I'm good now, after the Morphine.   O: Please see Vital Sign and Pain Assessment flowsheets for vitals and pain documentation.  Plan of care has been reviewed.   10/14/21 1001   Language Information   Language of Care Portuguese   Interpreter Yes   Name/ID [place] PT   Weight Bearing Status   RLE FWB   LLE WBAT   RUE FWB   LUE FWB   Mobility / Balance   Mobility / Balance Yes   Bed Mobility   Supine to Sit Independent   Sit to Supine Independent   Transfers   Transfer Yes   Transfer 1   Transfer From 1 Bed   Transfer Type 1 To and from   Transfer to 1 Stand   Technique 1 Sit to stand;Stand to sit   Transfer Device 1 Rolling walker (Front wheel walker)   Transfer Level of Assistance 1 Independent   Transfers 2   Transfer From 2 Stand   Transfer Type 2 To and from   Transfer to 2 Standard toilet with grab bars   Technique 2 Sit to stand;Stand to sit   Transfer Device 2 Rolling walker (Front wheel walker)   Transfer Level of Assistance 2 Independent   Gait   Gait Yes   Gait 1   Assistive Device 1 Rolling walker (Front wheel walker)   Pattern 1 L Decreased stance time;L Decreased heel strike;Decreased stride length   Gait Assistance 1 Independent   Distance (Ft) 1 180 Feet   Stair Management Technique 1 One rail L;With crutches;Step to pattern  (1  crutch)   Stair Management Assistance 1 Supervision   Number of Stairs 1 9   Gait 2   Stair Management Technique 2 One rail L;Step to pattern   Stair Management Assistance 2 Min assist 1  (HHA)   Number of Stairs 2 9   Balance   Sitting - Static Sits without support for > 30 sec   Sitting - Dynamic Moves/returns truncal midpoint greater than 2 inches in all planes   Standing - Static Bilateral upper extremity supported;Able to maintain 60 sec   Standing - Dynamic Forward lean;Lateral lean   Posture Rounded shoulders   Ther Exercise   Therapeutic Exercise? Yes   Named exercise Ankle Pumps   Joints Bilateral   Motion AROM   Position Supine    Reps 15   Ther Exercise 2   Named Exercise 2 Quad Sets;Straight Leg Raises   Joints 2 Left;Knee   Motion 2 AROM   Position 2 Supine   Reps 2 10   Sets 2 1   Safety Devices   Type of Devices Call bell in place;Bed alarm in place   Plan   Prognosis Excellent   PT Frequency Split session   Recommendation   Recommendation Outpatient PT   Equipment Recommended Rolling walker (Front wheel walker)       A:  Pt in be on arrival. Much better with transfers & mobility today. Independently able to transfer supine<>sit.  Increased ambulation 180' using rolling walker.  No LOB. Completed stairs well.  Performed 9 with rail & crutch  with supervision.  Reviewed TKA exercises.   Pt's brother will assist pt. Today. Dtr arriving tomorrow to assist.  In bed, cryo cuff placed. All needs in reach.   P: Continue per PT Care Plan.   Monia Pouch, PTA, Lic # [id number]

## 2021-10-14 NOTE — RN Shift Note (Signed)
Discharge instructions reviewed with patient with in person [place] interpreter Theme park manager). Patient was able to verbalize teach back of instructions. Patient discharged home via granddaughter with medications, follow up appointment, and discharge instructions.

## 2021-10-14 NOTE — Discharge Instr - Activity (Signed)
WBAT/ROMAT  Mobilize often  Ambulatory aid PRN.

## 2021-10-14 NOTE — Page 1 Discharge - Orders (Addendum)
PATIENT CARE REFERRAL FORM    Patient Demographics:           Name MRN Sex Birthdate Social Security   Stephanie [name] [id number] Female  1961/03/03  (61 year old) [id number]   Address Home Phone Insurance ID Marital Status Religion   [address]  [place] [id number] Payor: [employer] /  /  /   [id number] Divorced Unknown   PCP Admission Date Discharge Date Attending Provider Code Status   Lemar Livings, MD 10/12/21      10/14/21 Deeann Saint, MD Full Code     Primary Contacts:            Extended Emergency Contact Information  Primary Emergency Contact: GARCIA,FELIPE  Address: [address] [place], [place] [address] [place] Phone: [phone]  Relation: Son    Healthcare Proxy: Weldon Picking  Phone Number: [phone]    Discharge:             Discharge to: Home With Family  Address:    Has discharge transportation arranged?    Transportation at discharge:      Contact number after discharge:    Best time of day to contact:    Nurse/staff has permission to speak with:    [place] Interpreter requested: Yes  Family notified of disposition:         Diagnosis:             Isolation:   Infections:     Patient Active Problem List:     BMI 33.0-33.9,adult     Pure hypercholesterolemia     Family history of malignant neoplasm of gastrointestinal tract     Tubular adenoma of colon     Hyperopia with astigmatism and presbyopia     Immature cataract     Hypertension, goal below 140/90     Adenomatous polyp of colon     Squamous 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 eyes     Alcohol abuse     Depression, recurrent ([place])     Trauma and stressor-related disorder     Nontraumatic complete tear of right rotator cuff     Localized osteoarthritis of left knee     Obesity (BMI 30-39.9)     Chronic pain of left knee     Primary localized osteoarthritis of left knee      Review of Patient's Allergies indicates:  No Known Allergies   Hospital  Administered Immunizations Administered for This Admission  Never Reviewed    No immunizations on file.        Physician Order's: (Include specific orders for Diet, Labs, or Tests)       Page 1 Instructions    VNA Instructions: VNA INSTRUCTIONS: Surgical dressing change on 10/19/21. Dressing has been provided to patient. PT/OT.            Nutrition/Diet Instructions    Diet: no restrictions               Insulin Instructions  No orders of the defined types were placed in this encounter.    Anticoagulation Instructions    You are taking a blood thinner. It is called Aspirin 81mg  twice daily.    You should ask your nurse for more information on this medicine if you have any questions.  Current Discharge Medication List    START taking these medications    aspirin 81 MG chewable tablet  Take 1 tablet by mouth in the morning and 1 tablet before bedtime.  Qty: 60 tablet Refills: 0    docusate sodium 100 MG CAPS  Take 100 mg by mouth 2 (two) times daily  for 5 days  Qty: 10 capsule Refills: 0    oxyCODONE (ROXICODONE) 10 MG TABS immediate release tablet  Take 0.5-1 tablets by mouth every 4 (four) hours as needed (moderate to severe pain) Max Daily Amount: 60 mg  for up to 5 days  Qty: 30 tablet Refills: 0  Comments: Partial fill upon patient's request.    sennosides (SENOKOT) 8.6 MG tablet  Take 1 tablet by mouth in the morning and 1 tablet before bedtime. Do all this for 5 days.  Qty: 10 tablet Refills: 0      CONTINUE these medications which have CHANGED    acetaminophen (TYLENOL) 500 MG tablet  Take 2 tablets by mouth every 8 (eight) hours as needed for Pain (mild pain)  for up to 5 days  Qty: 30 tablet Refills: 0      CONTINUE these medications which have NOT CHANGED    Glucosamine 750 MG TABS  Take by mouth    Omega-3 Fatty Acids (FISH OIL) 1000 MG CAPS  Take by mouth    Turmeric 500 MG CAPS  Take by mouth    dexamethasone (DECADRON) 4 MG tablet  Take 4 mg by mouth in the morning and 4 mg in the evening.  Take with meals.    DULoxetine (CYMBALTA) 60 MG capsule  Take 1 capsule by mouth in the morning. Stop taking duloxetine 30mg .  Qty: 30 capsule Refills: 5  Comments: 07/20/2021 4:02:00 PM    losartan (COZAAR) 25 MG tablet  Take 1 tablet by mouth in the morning.  Qty: 90 tablet Refills: 2  Associated Diagnoses:BMI 33.0-33.9,adult; Chronic pain of left knee    omeprazole (PRILOSEC) 40 MG capsule  Take 1 capsule by mouth in the morning.  Qty: 30 capsule Refills: 11  Comments: 10/31/2020 9:32:25 AM    traZODone (DESYREL) 50 MG tablet  Take 1 tablet by mouth nightly  Qty: 90 tablet Refills: 3  Comments: 11/18/2020 5:05:18 PM  Associated Diagnoses:Psychophysiological insomnia      STOP taking these medications    ibuprofen (ADVIL) 600 MG tablet  Comments:  Reason for Stopping:          Palliative Care Referral  No orders of the defined types were placed in this encounter.    Hospice Referral  No orders of the defined types were placed in this encounter.    Discharge Supplies:   No discharge procedures on file.    Wound Care Instructions    Wound or Skin Care: keep dressing in place until 10/19/21. Please change the primary dressing on 10/19/21 (a dressing has been provided for you).           Follow-up Information    None       Current and Future Appointments at [facility] (90 Days) 10/14/2021 - 01/12/2022      Date Visit Type Length Department Provider     10/19/2021 10:00 AM PRIORITY THERAPY EVAL 60 60 min [facility] [[id number]] Gladstone Pih, PT    Patient Instructions:  10/28/2021 11:00 AM ORTHO POST OP 15 15 min [facility] [[id number]] Ronette Deter, [place]    Patient Instructions:                   11/19/2021  2:00 PM OFF30 30 min [facility] [[id number]] Minda Meo, MD    Patient Instructions:                        Physical Therapy:            Activity Instructions    WBAT/ROMAT  Mobilize  often  Ambulatory aid PRN.         Weight Bearing Status LRE: Initial Evaluation  Weight Bearing Status LLE: Weight bearing as tolerated    Frequency: Split session  Recommendation: Outpatient PT    Recommended Equipment: Equipment Recommended: Rolling walker (Front wheel walker)  Patient Stated Goals:    Prognosis: Prognosis: Excellent    Home Health Services:           Home Health Services:      CERTIFICATION (When Applicable):         I certify that the above named patient is: Requires skilled nursing care on a continuing basis for any of the conditions for which he/she received care during this hospitalization.    Who will follow this patient after discharge: Deeann Saint, M.D.      Electronically signed by:  Tonita Phoenix, PA-C

## 2021-10-14 NOTE — Discharge Inst - Page 1 Instructions (Addendum)
VNA Instructions: VNA INSTRUCTIONS: Surgical dressing change on 10/19/21. Dressing has been provided to patient. PT/OT.

## 2021-10-14 NOTE — Progress Notes (Signed)
Pt is seen by PT this morning, cleared for discharge with outpatient PT. Pt's brother will provide her ride home.   10/14/21 1214   Discharge Reviewed   Reviewed for discharge Yes   UR Completed? Yes   Final Discharge Plan    Was this a  3 day waiver? No   Discharge disposition Home   Home Home no services (01)   Transportation at Discharge Family   IM from medicare delivered [place] Pending No

## 2021-10-14 NOTE — Progress Notes (Signed)
Orthopedic Surgery Progress Note  S/p: L TKA with Dr. Madelaine Etienne on 10/12/21.   POD: 2    S: No overnight events. Patient denies fever or subjective chills. Patient remains afebrile with all other vital signs stable and within normal limits today. Denies chest pain or shortness of breath; using incentive spirometer as instructed. Tolerating PO food and drink without difficulty; denies nausea, vomiting, abdominal pain. Denies numbness or tingling distally. Denies calf pain or swelling bilaterally.     O:  Scheduled Medications:   pantoprazole  40 mg Oral Daily    docusate sodium  100 mg Oral TID    sennosides  17.2 mg Oral BID    celecoxib  200 mg Oral Daily    acetaminophen  1,000 mg Oral Q8H SCH    aspirin  81 mg Oral BID    losartan  25 mg Oral Daily    DULoxetine  60 mg Oral Daily    traZODone  50 mg Oral Nightly       Vitals:   Patient Vitals for the past 24 hrs:   BP Temp Temp src Pulse Resp SpO2   10/14/21 1317 115/72 98.8 F (37.1 C) -- 101 -- 92 %   10/14/21 1006 103/67 98.2 F (36.8 C) Oral 94 18 93 %   10/14/21 0521 111/71 98.7 F (37.1 C) -- 95 18 90 %   10/14/21 0138 105/67 -- -- 91 18 --   10/14/21 0126 105/67 98.8 F (37.1 C) Oral 91 20 91 %   10/13/21 2138 123/76 97.4 F (36.3 C) Oral 93 20 97 %   10/13/21 1846 121/70 98.6 F (37 C) Oral 90 20 94 %       Labs:    Recent Labs     10/14/21  0750   WBC 10.5   HGB 8.8*   HCT 27.8*   PLTA 197       I/O:   I/O 24 Hrs:  In: 240 [P.O.:240]  Out: 1000 [Urine:1000]    Physical Examination:   General: lying in bed, NAD, A&Ox3  CV: RRR  Lungs: unlabored breathing   Musculoskeletal: Optifoam dressing clean/dry/intact. Overlying skin is warm, dry, and intact with no erythema, edema, or ecchymosis. thigh soft/nontender. Calves soft/nontender with no erythema. Fires AT/GS/EHL. NVI distally. 2+ DP.     A/P: This is a 61 year old female POD 2 s/p L TKA with Dr. Madelaine Etienne on 10/12/21.     # Wound: Optifoam dressing intact, keep in place until POD #7 (10/19/21)  if drainage remains < 50%. New Optifoam dressing placed in patient's chart to be provided with discharge materials.   # Heme: H/H 8.8/27; mild acute anemia secondary to post-operative blood loss. Monitor CBC. Patient is asymptomatic; continue to monitor.   # Analgesia: multi-modal pain regimen, oxycodone prn, IV morphine bt, ice frequently.   # Activity: Continue working with PT/OT.   # Anticoagulation: Aspirin x 81mg  bid. Last dose 11/09/21.   # Proph: bowel regimen, encourage IS, venodynes.   # Dispo: Follow-up appointment scheduled with Ronette Deter, PA-C for 10/28/21 at 11:00am.     Patient was seen independently today and was discussed with orthopedic attending, RN, CM, and PT.     Please contact the orthopedic on call provider listed in [place] with any issues.     Tonita Phoenix, PA-C, 10/14/2021    [phone]

## 2021-10-14 NOTE — Discharge Instr - Diet (Addendum)
Diet: no restrictions

## 2021-10-16 ENCOUNTER — Ambulatory Visit (HOSPITAL_BASED_OUTPATIENT_CLINIC_OR_DEPARTMENT_OTHER): Payer: No Typology Code available for payment source | Admitting: Medical

## 2021-10-16 ENCOUNTER — Telehealth (HOSPITAL_BASED_OUTPATIENT_CLINIC_OR_DEPARTMENT_OTHER): Payer: Self-pay

## 2021-10-16 LAB — SURGICAL PATH SPECIMEN BONE

## 2021-10-16 NOTE — Telephone Encounter (Signed)
Spoke with patient with [place] interpreter ID: [id number].    Reviewed Post-op discharge instructions from 10/12/21 below.    Discharge Instructions    - You had a left total knee replacement with Dr. Madelaine Etienne.  - You may put as much weight on your operative leg as you can tolerate. Use your walker to help you walk.   - Keep your incision covered. The Optifoam dressing should be replaced with a new Optifoam 1 week after your surgery 2/20.  If the dressing comes off accidentally, cover the incision with gauze. Do not get the incision wet.   - You may shower with Optifoam dressing in place. Do not submerge the dressing, for example in a bath. Keep the dressing clean and dry.  - You will be on Aspirin, a blood thinner, for 4 weeks postop to prevent blood clots. Your last dose will be on 3/13.  - You have been given a prescription for narcotic pain medication. Please take as directed and taper down use as tolerated. Drink plenty of water and take Colace (stool softener) to help prevent constipation. You may take Senna (laxative) nightly as well.  - Attend your physical therapy and orthopaedic follow-up appointments.  - If you need a refill of your pain medication, please call 48 hours in advance. All requests for medication refills will be addressed within 48 hours. No prescription refills will be written after hours (5pm-8:30am) or on the weekend.  - Please call the orthopaedic clinic at [phone] if you have increased pain, numbness/tingling, drainage from your incision, fevers, chills, nausea/vomiting, if you have a fall, or have any other questions/concerns. Please go to the nearest emergency room if you experience chest pain or shortness of breath.      Patient understands and agrees with plan and will call clinic PRN.

## 2021-10-16 NOTE — Telephone Encounter (Signed)
Call to patient [phone]

## 2021-10-16 NOTE — Progress Notes (Deleted)
[facility] Visit Note     Subjective:   LANGUAGE: [place] telephone interpreter was used due to language barrier.    Called patient for televisit regarding her knee s/p replacement 4 days ago. She also made a duplicate appointment with orthopedics and their nursing team called the patient already. Declines this televisit and has no questions.     Ennis Forts, PA-C 10/16/2021

## 2021-10-16 NOTE — Telephone Encounter (Signed)
-----   Message from Georgeanne Nim sent at 10/16/2021  1:20 PM EST -----  Regarding: von deck patient - post op ?  Contact: [phone]  Patient is wondering what are the instructions for post op and what she can/can't do? Patient had surgery on 02/13.

## 2021-10-19 ENCOUNTER — Other Ambulatory Visit: Payer: Self-pay

## 2021-10-19 ENCOUNTER — Ambulatory Visit
Payer: No Typology Code available for payment source | Attending: Orthopaedic Surgery | Admitting: Rehabilitative and Restorative Service Providers"

## 2021-10-19 ENCOUNTER — Encounter (HOSPITAL_BASED_OUTPATIENT_CLINIC_OR_DEPARTMENT_OTHER): Payer: Self-pay | Admitting: Rehabilitative and Restorative Service Providers"

## 2021-10-19 DIAGNOSIS — Z96652 Presence of left artificial knee joint: Secondary | ICD-10-CM | POA: Diagnosis present

## 2021-10-19 DIAGNOSIS — M1712 Unilateral primary osteoarthritis, left knee: Secondary | ICD-10-CM | POA: Diagnosis present

## 2021-10-19 DIAGNOSIS — M25562 Pain in left knee: Secondary | ICD-10-CM | POA: Insufficient documentation

## 2021-10-19 DIAGNOSIS — G8929 Other chronic pain: Secondary | ICD-10-CM | POA: Diagnosis present

## 2021-10-19 HISTORY — DX: Presence of left artificial knee joint: Z96.652

## 2021-10-19 NOTE — Progress Notes (Addendum)
OUTPATIENT EVALUATION    Referring Provider: Denice Paradise, PA-C  [place],  [place] [address]    Precautions: L TKR 10/12/21, HTN.    SUBJECTIVE  Hx of Present Illness: Pt is a 61 year old female who presents to physical therapy with a physician diagnosis of Primary osteoarthritis of left knee [M17.12] s/p L TKR by Dr. Claris Gladden Deck on 10/12/21.  Pt reports having a lot of pain when walking, performing stairs.    Onset Date: 10/12/21.  Onset Code:  ICD-10 Code: Primary osteoarthritis of left knee [M17.12]      Imaging: XR Knee Left 1 or 2 views  Status: Final result     PACS Images    Show images for XR Knee Left 1 or 2 views  Study Result  Order #: [id number]   Accession #: [id number]  Narrative  Exam: Left knee, 1 portable view     INDICATION: 61 years-old Female presents s/p left total knee arthroplasty     COMPARISON: 03/03/2021     FINDINGS:     Bones and Joints: There has been interval left total knee arthroplasty. There are no radiographic findings of hardware complications on this AP portable view. There is no acute fracture or dislocation.   Alignment appears unremarkable.     Soft Tissues: Postoperative changes including gas.      IMPRESSION:     Unremarkable left TKA.         Reviewed and Electronically Signed by: Ralene Bathe MD   Signed Date/Time: 10-13-2021 06:22:17        Signing Physician Signing Date/Time   Ralene Bathe, MD 10/13/2021 6:22 AM         SUBJECTIVE    Prior Level of Function: I    Pain Level: 9/10    Dominant Extremity:     IADL'S/Work: IMPAIRED: Pt unable to ambulate, perform stairs, lay supine without L knee pain.    Dressing/Grooming: WNL    Driving/sitting tolerance: WNL    Sleeping: WNL    Aggravating Factors: Laying supine, stairs, walking.    Alleviating Factors: Nothing of note.    Past Medical History: see medical record.    Medications: (Rx Comments, concerns): For a list of current medications review the  Medication activity.   Mental Status/Communication: OTHER: Depression, ETOH abuse.     Learns Best: practice, demonstration.      Objective:     10/19/21 1000   Language Information   Language of Care Portuguese   Interpreter Yes   Name/ID Stephanie [name]   Evaluation Type   Evaluation Type Initial Evaluation   Rehab Discipline   Rehab Discipline PT   Visit   Visit number 1   POC Due date 11/18/21   Pain   Pain Score 9    Precautions   Precautions Yes   LE Precautions   L Total Knee replacement 10/12/21   Date of surgery 10/12/21   Weight Bearing Status   LLE FWB   LLE Assessment   LLE Assessment X   AROM LLE (degrees)   L Knee Flexion 0-140 80 Degrees   L Knee Extension 0-130 0 Degrees   PROM LLE (degrees)   L Knee Flexion 0-140 90 Degrees   Strength LLE   L Hip Flexion 4/5   L Hip Extension 4/5   L Hip ABduction 4/5   L Knee Flexion 4/5   L Knee Extension 4/5   L Ankle Dorsiflexion 4/5  Clinical Special Tests   Special Tests No   Sensation   Light Touch No apparent deficits   Coordination   Gross Motor Performance Observation Stiff;Weak   Functional Mobility   Gait Antalgic L LE with RW   Assistive devices Rolling walker   Balance fair on L LE   Palpation   Tenderness to Palpation general about LLE   Increased Tissue Density general about LLE   LE Joint Mobility (0-6 scale)   LE Mobility assessment  Yes   Left Knee Patellofemoral   L Patellofemoral Superior 3/6   L Patellofemoral Inferior 3/6   L Patellofemoral Medial 3/6   L Patellofemoral Lateral 3/6   Stephanie Education   What was taught? role of PT, plan of care, HEP (quad set, heel slide, bridge, s/l hip abd)   Method Verbal   Stephanie comprehension Yes     Stephanie [name], PT, Lic # [id number]    FUNCTIONAL DEFICITS: Pt unable to ambulate, perform stairs, lay supine without L knee pain.  FUNCTIONAL OUTCOME MEASURES:       Physical Therapy Plan of Care    DG:LOVFIEPPI Stephanie Junker, MD  Referring Provider: Denice Paradise, PA-C  [place]  [address] [place],  [place] [address]  Diagnosis: Primary osteoarthritis of left knee [M17.12]    Assessment/Objective Findings: Stephanie is a 61 year old female with complaints of pain in her left knee following TKR.  The following PT problems were noted upon evaluation: decreased strength of L knee flexion and extension, decreased L knee flexion AROM, antalgic gait with RW LLE, fair SLS LLE, TTP and diffuse joint swelling of L knee. These problems limit the Stephanie with the following functional activities: Pt unable to ambulate, perform stairs, lay supine without L knee pain. This Stephanie will benefit from skilled PT plan of care. The prescribed treatment plan of care is medically necessary secondary to Primary osteoarthritis of left knee [M17.12].  Co-morbidities of Stephanie Active Problem List:     BMI 33.0-33.9,adult     Pure hypercholesterolemia     Family history of malignant neoplasm of gastrointestinal tract     Tubular adenoma of colon     Hyperopia with astigmatism and presbyopia     Immature cataract     Hypertension, goal below 140/90     Adenomatous polyp of colon     Squamous 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 eyes     Alcohol abuse     Depression, recurrent ([facility])     Trauma and stressor-related disorder     Nontraumatic complete tear of right rotator cuff     Localized osteoarthritis of left knee     Obesity (BMI 30-39.9)     Chronic pain of left knee     Primary osteoarthritis of left knee     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  In my professional opinion this Stephanie requires skilled physical therapy to address the concerns of the functional limitations outlined above.    Short Term Functional Goals: 4 weeks.   Pt will demonstrate independence and compliance with HEP in 2 weeks  Pt will demonstrate improved ROM of L knee flexion to 110 degrees in 4 weeks   Pt will demonstrate improved SLS of LLE to 10  seconds LLE on solid surface in 4 weeks   Pt will demonstrate improved strength of LLE by 1  MMT Grade in all ranges in 4 weeks   Pt will demonstrate improved gait mechanics by no antalgic gait LLE in 4 weeks     Pt will demonstrate improved tolerance of laying supine by 4/10 pain in 4 weeks      Long Term Goal: 8 weeks.   Pt will demonstrate ability to perform stairs without LLE pain in 8 weeks.  Pt will demonstrate ability to lay supine without LLE pain in 8 weeks.    Treatment Plan: ** PT Eval - Low Complexity (CPT 97161)  ** PT Re-Eval (CPT [phone])  ** Stretching/ROM/Therapeutic Exercise [phone])  ** Home Exercise Program/ Stephanie Education (CPT [phone])  ** Neuromuscular Re-education (CPT [phone])  ** Manual Therapy / Joint / Soft tissue Mobilization (CPT 97140)  ** Gait Training (CPT [id number])  ** Functional Activities (CPT 97530)    Recommend Physical Therapy be continued 1 times per week for 8 weeks.  The rehabilitation potential for this Stephanie is excellent  Based on comprehensive examination and evaluation, this Stephanie meets criteria for LOW complexity due to combined factor(s) of stable presentation of 1-2 body segment(s).    Stephanie [name] is aware of attendance policy: Yes  Plan of care discussed with Stephanie/Family: Yes  Stephanie goals reviewed and incorporated in plan of care: Yes  Stephanie/Family agrees with plan of care: Yes  Stephanie/Family education: Yes  Does Stephanie feel safe at home:  Yes      Stephanie [name], PT, DPT, HFS [phone]

## 2021-10-21 ENCOUNTER — Ambulatory Visit: Payer: No Typology Code available for payment source | Attending: Family Medicine | Admitting: Family Medicine

## 2021-10-21 ENCOUNTER — Encounter (HOSPITAL_BASED_OUTPATIENT_CLINIC_OR_DEPARTMENT_OTHER): Payer: Self-pay | Admitting: Clinic/Center

## 2021-10-21 ENCOUNTER — Telehealth (HOSPITAL_BASED_OUTPATIENT_CLINIC_OR_DEPARTMENT_OTHER): Payer: Self-pay | Admitting: Clinic/Center

## 2021-10-21 DIAGNOSIS — Z09 Encounter for follow-up examination after completed treatment for conditions other than malignant neoplasm: Secondary | ICD-10-CM | POA: Insufficient documentation

## 2021-10-21 DIAGNOSIS — Z96652 Presence of left artificial knee joint: Secondary | ICD-10-CM | POA: Diagnosis present

## 2021-10-21 MED ORDER — ACETAMINOPHEN 500 MG PO TABS
1000.00 mg | ORAL_TABLET | Freq: Three times a day (TID) | ORAL | 0 refills | Status: AC | PRN
Start: 2021-10-21 — End: 2021-10-26

## 2021-10-21 MED ORDER — OXYCODONE HCL 10 MG PO TABS
5.00 mg | ORAL_TABLET | ORAL | 0 refills | Status: AC | PRN
Start: 2021-10-21 — End: 2021-10-26

## 2021-10-21 MED FILL — OXYCODONE  10MG: 5 days supply | Qty: 30 | Fill #0

## 2021-10-21 MED FILL — ACETAMIN   500MG: 5 days supply | Qty: 30 | Fill #0

## 2021-10-21 NOTE — Telephone Encounter (Signed)
Call via [place] telephone interpreter    Hospital D/C follow up call  Spoke with the patient who had L knee replacement  Patient states she is still experiencing knee pain have two pain medications pills left  Will need refill  Have ortho and PT appointment scheduled for next month    Hospital F/U appointment scheduled for today at 5:00 PM

## 2021-10-21 NOTE — Telephone Encounter (Signed)
Call via [place] telephone interpreter    Hospital F/U call  Unable to reach the patient   Message with the clinic  The clinic telephone number left to call

## 2021-10-21 NOTE — Telephone Encounter (Signed)
-----   Message from Lovell Sheehan sent at 10/21/2021 10:02 AM EST -----  Regarding: returning your call  Pt is returning your call from today, please call pt back @ [phone].

## 2021-10-21 NOTE — Progress Notes (Signed)
Clinic Note:    SUBJECTIVE:  Stephanie [name] is a 61 year old female with the following active problem list:    Patient Active Problem List:     BMI 33.0-33.9,adult     Pure hypercholesterolemia     Family history of malignant neoplasm of gastrointestinal tract     Tubular adenoma of colon     Hyperopia with astigmatism and presbyopia     Immature cataract     Hypertension, goal below 140/90     Adenomatous polyp of colon     Squamous 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 eyes     Alcohol abuse     Depression, recurrent (HCC)     Trauma and stressor-related disorder     Nontraumatic complete tear of right rotator cuff     Localized osteoarthritis of left knee     Obesity (BMI 30-39.9)     Chronic pain of left knee     Primary osteoarthritis of left knee     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, left      CC:     1) TKA  -L , admitted for surgery on 2/13  -has f/u with surgeon on 3/1  -doing PT  -daughter comes to check on her  -needs refills of oxycodone and tylenol    Past Medical History:  02/12/2016: Chronic right shoulder pain  No date: Depression  03/20/2012: Diverticulosis of colon      Comment:  Noted on colonoscopy 6/13   08/16/2010: Elevated hemoglobin A1c      Comment:  HEMOGLOBIN A1C (%) Date Value 03/06/2015 5.6                05/20/2014 5.4 05/08/2012 6.0 (H)   No results found for:               POCA1C   11/21/2012: Hyperopia with astigmatism and presbyopia  No date: Hypertension  11/21/2012: Immature cataract  01/21/2012: Impingement syndrome of right shoulder      Comment:  10/14: pt taking  [place] med (meloxicam 7.5 mg BID                and cyclobenzaprine); Will ask for refill once supply                finished; PCP ok with refill of flexeril on temporary                basis;   01/26/2008: Irritable bladder      Comment:  Seen by Dr Jeraldine Loots 5/09, no incontinence, likely                irritable bladder.  Trial of anticholinergic, refer to                urology, has appt Dr  Minna Antis 02/23/08  02/16/2005: Leiomyoma of uterus, unspecified      Comment:  Dr Jeraldine Loots hysterectomy November, 2006; cervix retained,               last pap 11/11 neg Repeat pap/HPV 07/2015   02/15/2014: Mass of hand  07/30/2011: Other plastic surgery for unacceptable cosmetic appearance      Comment:  Breast augmentation scheduled 08/02/11;  Dr.  Ancil Boozer Phone#: [phone]     No date: PONV (postoperative nausea and vomiting)  10/12/2021: Primary osteoarthritis of left knee  10/19/2021: S/P TKR (  total knee replacement) not using cement, left  01/08/2016: Schistosomiasis  08/13/2016: Squamous 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   eyes  06/18/2004: Urinary calculus, unspecified    Past Surgical History:  No date: BREAST ENHANCEMENT SURGERY      Comment:  12/12 had bilateral mammoplasty and saline implants, had               2nd surgery 5/30 for revision  No date: MAMMAPLASTY AUGMENTATION W/PROSTHETIC IMPLANT  No date: MASTOPEXY  No date: OB ANTEPARTUM CARE CESAREAN DLVR & POSTPARTUM      Comment:  x3  No date: REDUCTION MAMMAPLASTY  No date: TOTAL ABDOMINAL HYSTERECT W/WO RMVL TUBE OVARY      Comment:  for fibroids, still has cervix    aspirin 81 MG chewable tablet, Take 1 tablet by mouth in the morning and 1 tablet before bedtime., Disp: 60 tablet, Rfl: 0  Glucosamine 750 MG TABS, Take by mouth, Disp: , Rfl:   Omega-3 Fatty Acids (FISH OIL) 1000 MG CAPS, Take by mouth, Disp: , Rfl:   Turmeric 500 MG CAPS, Take by mouth, Disp: , Rfl:   dexamethasone (DECADRON) 4 MG tablet, Take 4 mg by mouth in the morning and 4 mg in the evening. Take with meals., Disp: , Rfl:   DULoxetine (CYMBALTA) 60 MG capsule, Take 1 capsule by mouth in the morning. Stop taking duloxetine 30mg ., Disp: 30 capsule, Rfl: 5  losartan (COZAAR) 25 MG tablet, Take 1 tablet by mouth in the morning., Disp: 90 tablet, Rfl: 2  omeprazole (PRILOSEC) 40 MG capsule, Take 1 capsule by mouth in the morning., Disp: 30 capsule, Rfl:  11  traZODone (DESYREL) 50 MG tablet, Take 1 tablet by mouth nightly, Disp: 90 tablet, Rfl: 3    No current facility-administered medications on file prior to visit.      Review of Patient's Allergies indicates:  No Known Allergies    Review of patient's family history indicates:  Problem: Cancer - Other      Relation: Father          Age of Onset: (Not Specified)          Comment: stomach  Problem: Heart      Relation: Brother          Age of Onset: (Not Specified)          Comment: died at 89 during valve replacement  Problem: Heart      Relation: Brother          Age of Onset: (Not Specified)          Comment: chestpain with neg w/u  Problem: Heart      Relation: Mother          Age of Onset: (Not Specified)          Comment: Died of MI age 72  Problem: Diabetes      Relation: Mother          Age of Onset: (Not Specified)      Social History     Socioeconomic History    Marital status: Divorced     Spouse name: Not on file    Number of children: Not on file    Years of education: Not on file    Highest education level: Not on file   Occupational History    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; 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   Tobacco Use    Smoking status: Former  Types: Cigarettes     Quit date: 07/27/2001     Years since quitting: 20.2     Passive exposure: Never    Smokeless tobacco: Never   Substance and Sexual Activity    Alcohol use: Yes     Alcohol/week: 6.0 standard drinks     Types: 6 Cans of beer per week     Comment: 6 beers per week     Drug use: No    Sexual activity: Not Currently     Partners: Male     Comment: G3P3, no hx STD, no hx abn Pap   Other Topics Concern    Not on file   Social History Narrative    08/03/19 Divorced, has 3 children, lives alone, works as a Education administrator lady    Social Determinants of Librarian, academic Strain: Not on file  Food Insecurity: Not on file  Transportation Needs: Not on file  Physical Activity: Not on file  Stress: Not on file  Social Connections:  Not on file  Intimate Partner Violence: Not on file  Housing Stability: Not on file    OBJECTIVE:  LMP 07/11/2005   Speaking in full sentences             A/P:     (Z09) Hospital discharge follow-up  (primary encounter diagnosis)/  (D78.242) 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 The patient indicates understanding of these issues and agrees with the plan.  2.  The patient is given an After Visit Summary sheet that lists all of their medications with directions, their allergies, orders placed during this encounter, immunization dates, and follow- up instructions.  3. I reviewed the patient's medical information and medical history   4.  I reconciled the patient's medication list and prepared and supplied needed refills.  5.  I have reviewed the past medical, family, and social history sections including the medications and allergies listed in the above medical record    Marquis Lunch, [place], 10/21/2021

## 2021-10-23 NOTE — Addendum Note (Signed)
Addended byNile Dear on: 10/23/2021 09:54 AM     Modules accepted: Orders

## 2021-10-26 NOTE — Progress Notes (Signed)
I certify that the documented Treatment Plan is reasonable and necessary.    10/26/2021  Lemar Livings, MD

## 2021-10-28 ENCOUNTER — Ambulatory Visit: Payer: No Typology Code available for payment source | Attending: Orthopaedic Surgery | Admitting: Surgical

## 2021-10-28 ENCOUNTER — Ambulatory Visit (HOSPITAL_BASED_OUTPATIENT_CLINIC_OR_DEPARTMENT_OTHER): Payer: No Typology Code available for payment source | Admitting: Rehabilitative and Restorative Service Providers"

## 2021-10-28 ENCOUNTER — Other Ambulatory Visit: Payer: Self-pay

## 2021-10-28 DIAGNOSIS — Z96652 Presence of left artificial knee joint: Secondary | ICD-10-CM

## 2021-10-28 DIAGNOSIS — M1712 Unilateral primary osteoarthritis, left knee: Secondary | ICD-10-CM | POA: Insufficient documentation

## 2021-10-28 DIAGNOSIS — G8929 Other chronic pain: Secondary | ICD-10-CM | POA: Diagnosis present

## 2021-10-28 DIAGNOSIS — M25562 Pain in left knee: Secondary | ICD-10-CM | POA: Insufficient documentation

## 2021-10-28 NOTE — Progress Notes (Signed)
Orthopedic Problem List:   S/p L TKR, DOS 10/12/21    HPI: This is a 61 year old year old [place] speaking female who presents for two week postop for L TKR. She is ambulating steadily with a cane. She denies any fever, chills, recent injuries or distal numbness or tingling. She attends outpatient PT twice a week and doing her home exercises. She takes Tylenol and oxycodone as needed for pain. Taking a baby aspirin twice daily for DVT prophylaxis.    Physical Exam:  Vital Signs: LMP 07/11/2005   Gen: Alert and oriented, No acute distress. Pleasant and appropriate.   HEENT: Head atraumatic, normocephalic. Conjunctivae clear. EOMI.  Respiratory: No labored breathing. Speaking in full sentences  MSK: LLE: incision is clean, dry and intact with steri strips. No erythema or ecchymosis. Mild swelling to knee joint. Grossly stable to varus and valgus stressors. Thigh and calf soft and non-tender. +EHL/FHL, NVI distally.    Assessment and Plan: This is a 61 year old year old [place] speaking female who presented for 2 week postop for L TKR, doing well. Continue PT and HEP. Continue baby aspirin twice daily for additional two more weeks. Pain regimen as needed for pain.    The patient was seen independently in the PA clinic.    I spent a total of 20 minutes on this visit on the date of service (total time includes all activities performed on the date of service).     I reviewed the likely diagnosis, the prognosis, and various treatment options in detail. Risks and benefits of treatment plan discussed. The patient's questions have been answered, and the patient understands agrees with treatment plan.     This note was prepared using voice recognition software. Please disregard any transcription errors.      Ronette Deter, PA-C  Beeper [phone]

## 2021-10-28 NOTE — Progress Notes (Signed)
S: Pt reports feeling really good, the HEP is going well, 0/10.  O: Refer to Rehabilitation Treatment Flowsheet     10/28/21 0800   Language Information   Language of Care Portuguese   Interpreter Yes   Name/ID [place]   Precautions   Precautions Yes   LE Precautions   L Total Knee replacement 10/12/21   Date of surgery 10/12/21   Weight Bearing Status   LLE FWB   Rehab Discipline   Rehab Discipline PT   Visit   Visit number 2   POC Due date 11/18/21   Time Calculation   Start Time 7583   Stop Time 0902   Time Calculation (min) 24 min   Pain   Pain Score 0    Manual Therapy   Technique TKE L knee   Time in minutes 8 min   Ther Exercise   Exercise Quad Set   Holds 30   Ther Exercise 2   Exercise SLR   Holds 2 2x10 each   Ther Exercise 3   Exercise 3 Heel Slides c/ strap   Holds 3 30   Ther Exercise 4   Exercise 4 Sit-stand   Holds 4 20   Patient Education   What was taught? cont HEP   Method Verbal   Patient comprehension Yes     Gladstone Pih, PT, Lic # [id number]  A: Pt tol man therapy without pain, minor terminal knee extension lag still noted.  Pt tol therex without pain, she is progressing, much improved pain scores today.  P: cont L knee strengthening.  Gladstone Pih, PT, Lic # [id number]

## 2021-11-02 ENCOUNTER — Ambulatory Visit (HOSPITAL_BASED_OUTPATIENT_CLINIC_OR_DEPARTMENT_OTHER): Payer: Self-pay | Admitting: Rehabilitative and Restorative Service Providers"

## 2021-11-04 ENCOUNTER — Ambulatory Visit (HOSPITAL_BASED_OUTPATIENT_CLINIC_OR_DEPARTMENT_OTHER): Payer: No Typology Code available for payment source | Admitting: Rehabilitative and Restorative Service Providers"

## 2021-11-04 ENCOUNTER — Other Ambulatory Visit: Payer: Self-pay

## 2021-11-04 DIAGNOSIS — M1712 Unilateral primary osteoarthritis, left knee: Secondary | ICD-10-CM

## 2021-11-04 DIAGNOSIS — G8929 Other chronic pain: Secondary | ICD-10-CM | POA: Diagnosis present

## 2021-11-04 DIAGNOSIS — M25562 Pain in left knee: Secondary | ICD-10-CM | POA: Diagnosis present

## 2021-11-04 DIAGNOSIS — Z96652 Presence of left artificial knee joint: Secondary | ICD-10-CM | POA: Diagnosis present

## 2021-11-04 NOTE — Progress Notes (Signed)
S:  Patient reports L knee pain today 4/10; she is compliant with HEP, "I can't bend my knee".     O:  Refer to rehab tx flow sheet;     A:  Some discomfort w/ initiation of partial 1/4 rotation on R bike; improved range to 1/2 rotation with rocking repetition; mod/mild inflammation medial and lateral aspect of L knee w/ slight TTP; incision is dry and clear with surgical strips partially in place; good demonstration of quad setting w/ SLR.      P:  Con to promote ROM; initiated glute activation in standing (abd).         11/04/21 1200   Language Information   Language of Care Portuguese   Interpreter Yes   Precautions   Precautions Yes   LE Precautions   L Total Knee replacement 10/12/21   Date of surgery 10/12/21   Weight Bearing Status   RLE FWB   Rehab Discipline   Rehab Discipline PT   Visit   Visit number 3   POC Due date 11/18/21   Time Calculation   Start Time 1205   Stop Time 1230   Time Calculation (min) 25 min   Pain   Pain Score 4    Manual Therapy   Manual Therapy Yes   Technique STM EOB medial/lateral L knee   Time in minutes 6'   Ther Exercise   Therapeutic Exercise? Yes   Ther Exercise 2   Exercise SLR w/ quad setting   Reps 2 10   Sets 2 2   Ther Exercise 3   Exercise 3 HS curls w/ PB   Reps 3 10   Sets 3 2   Holds 3 over pressure applied for flexion   Ther Exercise 5   Exercise 5 R bike global 1/2 rotation   Holds 5 5'   Patient Education   What was taught? HEP cont   Method Verbal;Demo;Practice   Patient comprehension Yes       Lanae Crumbly, PTA, Lic # [id number]

## 2021-11-06 ENCOUNTER — Ambulatory Visit (HOSPITAL_BASED_OUTPATIENT_CLINIC_OR_DEPARTMENT_OTHER): Payer: No Typology Code available for payment source | Admitting: Rehabilitative and Restorative Service Providers"

## 2021-11-06 ENCOUNTER — Other Ambulatory Visit: Payer: Self-pay

## 2021-11-06 DIAGNOSIS — G8929 Other chronic pain: Secondary | ICD-10-CM | POA: Diagnosis present

## 2021-11-06 DIAGNOSIS — M1712 Unilateral primary osteoarthritis, left knee: Secondary | ICD-10-CM

## 2021-11-06 DIAGNOSIS — Z96652 Presence of left artificial knee joint: Secondary | ICD-10-CM | POA: Diagnosis present

## 2021-11-06 DIAGNOSIS — M25562 Pain in left knee: Secondary | ICD-10-CM | POA: Diagnosis present

## 2021-11-06 NOTE — Progress Notes (Signed)
S:  Patient reports L knee pain today 7/10; she has increased pain last night; she is compliant w/ HEP.      O:  Refer to rehab tx flow sheet; surgical strips have been removed; incision is dry and clean.    A:  Pt completed 1/2 rotation on R bike; improved flexion with strap assisted heel slides.  Good functional STS mechanics on slightly modified plinth.      P:  Cont to promote L knee ROM; initiate balance, standing hip abd.         11/06/21 1000   Language Information   Language of Care Portuguese   Interpreter Yes   Precautions   Precautions Yes   LE Precautions   L Total Knee replacement 10/12/21   Date of surgery 10/12/21   Weight Bearing Status   RLE FWB   Rehab Discipline   Rehab Discipline PT   Visit   Visit number 4   POC Due date 11/18/21   Time Calculation   Start Time 1030   Stop Time 1058   Time Calculation (min) 28 min   Pain   Pain Score 7    Manual Therapy   Manual Therapy Yes   Technique STM EOB medial/lateral L knee   Time in minutes 6'   Ther Exercise   Therapeutic Exercise? Yes   Ther Exercise 3   Exercise 3 heel slides w/ strap   Reps 3 10   Sets 3 3   Ther Exercise 4   Exercise 4 Sit-stand   Reps 4 10   Sets 4 2   Ther Exercise 5   Exercise 5 R bike global 1/2 rotation   Holds 5 6'   Patient Education   What was taught? HEP cont + STS   Method Verbal;Demo;Practice   Patient comprehension Yes       Lanae Crumbly, PTA, Lic # [id number]

## 2021-11-09 ENCOUNTER — Ambulatory Visit (HOSPITAL_BASED_OUTPATIENT_CLINIC_OR_DEPARTMENT_OTHER): Payer: No Typology Code available for payment source | Admitting: Rehabilitative and Restorative Service Providers"

## 2021-11-09 ENCOUNTER — Ambulatory Visit (HOSPITAL_BASED_OUTPATIENT_CLINIC_OR_DEPARTMENT_OTHER): Payer: Self-pay | Admitting: Rehabilitative and Restorative Service Providers"

## 2021-11-09 ENCOUNTER — Other Ambulatory Visit: Payer: Self-pay

## 2021-11-09 DIAGNOSIS — G8929 Other chronic pain: Secondary | ICD-10-CM | POA: Diagnosis present

## 2021-11-09 DIAGNOSIS — M25562 Pain in left knee: Secondary | ICD-10-CM | POA: Diagnosis present

## 2021-11-09 DIAGNOSIS — M1712 Unilateral primary osteoarthritis, left knee: Secondary | ICD-10-CM

## 2021-11-09 DIAGNOSIS — Z96652 Presence of left artificial knee joint: Secondary | ICD-10-CM

## 2021-11-09 NOTE — Progress Notes (Signed)
S: Pt reports the same pain 5/10 at night.  O: Refer to Rehabilitation Treatment Flowsheet     11/09/21 1100   Language Information   Language of Care Portuguese   Interpreter Yes   Name/ID -Elle   Precautions   Precautions Yes   LE Precautions   L Total Knee replacement 10/12/21   Date of surgery 10/12/21   Weight Bearing Status   RLE FWB   Rehab Discipline   Rehab Discipline PT   Visit   Visit number 5   POC Due date 11/18/21   Time Calculation   Start Time 1100   Stop Time 1130   Time Calculation (min) 30 min   Pain   Pain Score 5    Ther Exercise   Exercise Quad Set   Holds 30   Ther Exercise 2   Exercise SLR   Holds 2 20   Ther Exercise 3   Exercise 3 heel slides w/ strap   Holds 3 30   Ther Exercise 4   Exercise 4 Sit-stand   Holds 4 10   Ther Exercise 5   Exercise 5 R bike global 1/2 rotation   Holds 5 5 min   Patient Education   What was taught? cont HEP   Method Verbal   Patient comprehension Yes     Gladstone Pih, PT, Lic # [id number]  A: Pt tol therex well with some pain and apprehension, AAROM knee flexion yielding 82 degrees in supine today.  Pt in need of continued skilled PT.  P: cont LE strengthening.  Gladstone Pih, PT, Lic # [id number]

## 2021-11-11 ENCOUNTER — Ambulatory Visit (HOSPITAL_BASED_OUTPATIENT_CLINIC_OR_DEPARTMENT_OTHER): Payer: No Typology Code available for payment source | Admitting: Rehabilitative and Restorative Service Providers"

## 2021-11-11 ENCOUNTER — Other Ambulatory Visit: Payer: Self-pay

## 2021-11-11 DIAGNOSIS — M1712 Unilateral primary osteoarthritis, left knee: Secondary | ICD-10-CM

## 2021-11-11 DIAGNOSIS — G8929 Other chronic pain: Secondary | ICD-10-CM | POA: Diagnosis present

## 2021-11-11 DIAGNOSIS — M25562 Pain in left knee: Secondary | ICD-10-CM | POA: Diagnosis present

## 2021-11-11 DIAGNOSIS — Z96652 Presence of left artificial knee joint: Secondary | ICD-10-CM | POA: Diagnosis present

## 2021-11-11 NOTE — Progress Notes (Signed)
S:  Patient reports 0/10 L knee pain; she is compliant w/ HEP and getting around ok and she is able to negotiate stairs.      O:  Refer to rehab tx flow sheet    A:  Some discomfort w/ knee flex stretch; overall good tolerance. Incision is dry and clean with so mild tenderness along proximal incision line.      P:  Cont w/ ROM weight bearing activities; STS; step ups.         11/11/21 1000   Language Information   Language of Care Portuguese   Interpreter Yes   Name/ID [place]   Precautions   Precautions Yes   LE Precautions   L Total Knee replacement 10/12/21   Date of surgery 10/12/21   Weight Bearing Status   RLE FWB   Rehab Discipline   Rehab Discipline PT   Visit   Visit number 6   POC Due date 11/18/21   Time Calculation   Start Time 1035   Stop Time 1105   Time Calculation (min) 30 min   Pain   Pain Score 0    Manual Therapy   Manual Therapy Yes   Technique STM EOB medial/lateral L knee   Time in minutes 6'   Ther Exercise   Therapeutic Exercise? Yes   Ther Exercise 3   Exercise 3 heel slides w/ strap   Reps 3 20   Sets 3 1   Ther Exercise 5   Exercise 5 R bike global 1/2 rotation   Holds 5 5'   Ther Exercise 6   Exercise 6 TKE 9#   Reps 6 10   Sets 6 2   Ther Exercise 7   Exercise 7 step up knee flex stretch   Reps 7 10   Sets 7 1   Holds 7 10" hold       [place], [place], Lic # [id number]

## 2021-11-16 ENCOUNTER — Other Ambulatory Visit: Payer: Self-pay

## 2021-11-16 ENCOUNTER — Ambulatory Visit (HOSPITAL_BASED_OUTPATIENT_CLINIC_OR_DEPARTMENT_OTHER): Payer: Self-pay | Admitting: Rehabilitative and Restorative Service Providers"

## 2021-11-16 ENCOUNTER — Ambulatory Visit (HOSPITAL_BASED_OUTPATIENT_CLINIC_OR_DEPARTMENT_OTHER): Payer: No Typology Code available for payment source | Admitting: Rehabilitative and Restorative Service Providers"

## 2021-11-16 DIAGNOSIS — G8929 Other chronic pain: Secondary | ICD-10-CM

## 2021-11-16 DIAGNOSIS — M25562 Pain in left knee: Secondary | ICD-10-CM | POA: Diagnosis present

## 2021-11-16 DIAGNOSIS — M1712 Unilateral primary osteoarthritis, left knee: Secondary | ICD-10-CM | POA: Diagnosis not present

## 2021-11-16 DIAGNOSIS — Z96652 Presence of left artificial knee joint: Secondary | ICD-10-CM | POA: Diagnosis present

## 2021-11-16 NOTE — Progress Notes (Signed)
S: Pt reports having no pain today, 0/10.  O: Refer to Rehabilitation Treatment Flowsheet     11/16/21 1100   Language Information   Language of Care Portuguese   Interpreter Yes   Name/ID [place]artholomew   Precautions   Precautions Yes   LE Precautions   L Total Knee replacement 10/12/21   Date of surgery 10/12/21   Weight Bearing Status   RLE FWB   Rehab Discipline   Rehab Discipline PT   Visit   Visit number 7   POC Due date 11/18/21   Time Calculation   Start Time 1100   Stop Time 1130   Time Calculation (min) 30 min   Pain   Pain Score 0    Ther Exercise   Exercise Quad Set   Holds 30   Ther Exercise 2   Exercise SLR   Holds 2 20   Ther Exercise 3   Exercise 3 heel slides w/ strap   Holds 3 30   Ther Exercise 4   Exercise 4 Sit-stand   Holds 4 30   Ther Exercise 5   Exercise 5 TKE L knee   Holds 5 12#s x 30   Ther Exercise 6   Exercise 6 R bike global 1/2 rotation   Holds 6 5 min   Patient Education   What was taught? cont HEP   Method Verbal   Patient comprehension Yes     Gladstone Pih, PT, Lic # [id number]  A: Pt tol therex without pain, AROM L knee flexion 102 degrees.  Pt pain is well controlled, 0 for the last 2 visits, however ROM is in need of further progress.  Pt in need of continued skilled PT.  P: cont HEP and strengthening.    Gladstone Pih, PT, Lic # [id number]

## 2021-11-19 ENCOUNTER — Other Ambulatory Visit (HOSPITAL_BASED_OUTPATIENT_CLINIC_OR_DEPARTMENT_OTHER): Payer: Self-pay | Admitting: Family Medicine

## 2021-11-19 ENCOUNTER — Other Ambulatory Visit: Payer: Self-pay

## 2021-11-19 ENCOUNTER — Ambulatory Visit: Payer: No Typology Code available for payment source | Attending: Internal Medicine | Admitting: Internal Medicine

## 2021-11-19 VITALS — BP 117/75 | HR 87 | Temp 97.3°F | Wt 163.0 lb

## 2021-11-19 DIAGNOSIS — Z96652 Presence of left artificial knee joint: Secondary | ICD-10-CM | POA: Insufficient documentation

## 2021-11-19 DIAGNOSIS — F339 Major depressive disorder, recurrent, unspecified: Secondary | ICD-10-CM | POA: Diagnosis present

## 2021-11-19 DIAGNOSIS — E669 Obesity, unspecified: Secondary | ICD-10-CM | POA: Insufficient documentation

## 2021-11-19 DIAGNOSIS — I10 Essential (primary) hypertension: Secondary | ICD-10-CM | POA: Diagnosis present

## 2021-11-19 DIAGNOSIS — R0609 Other forms of dyspnea: Secondary | ICD-10-CM | POA: Diagnosis present

## 2021-11-19 DIAGNOSIS — E7800 Pure hypercholesterolemia, unspecified: Secondary | ICD-10-CM | POA: Diagnosis present

## 2021-11-19 DIAGNOSIS — F5104 Psychophysiologic insomnia: Secondary | ICD-10-CM

## 2021-11-19 MED ORDER — TRAZODONE HCL 50 MG PO TABS
50.0000 mg | ORAL_TABLET | Freq: Every evening | ORAL | 0 refills | Status: DC
Start: 2021-11-19 — End: 2021-11-20

## 2021-11-19 MED FILL — LOSARTAN POT 25MG: 90 days supply | Qty: 90 | Fill #2

## 2021-11-19 MED FILL — TRAZODONE 50MG: 7 days supply | Qty: 7 | Fill #0

## 2021-11-19 MED FILL — OMEPRAZOLE 40MG: 90 days supply | Qty: 90 | Fill #4

## 2021-11-19 MED FILL — DULOXETINE 60MG: 60 days supply | Qty: 60 | Fill #2

## 2021-11-19 NOTE — Progress Notes (Signed)
CARDIOLOGY CLINIC PROGRESS NOTE   Date of visit: 11/19/2021  Language: [place]    History of present illness:   61 year old female with PMH essential HTN, left knee osteoarthritis, dizziness, initially referred to [place] for preoperative clearance. Patient reports shortness of breath on moderate level of activities. She denies any chest pain, palpitations, leg edema, orthopnea, or PND. Positive dizziness particularly when standing up.       Most Recent BP Reading(s)  10/14/21 : 115/72  10/01/21 : 129/82  09/24/21 : (!) 147/91  09/16/21 : 130/70  08/28/21 : 123/80      Most Recent Weight Reading(s)  10/12/21 : 74.8 kg (165 lb)  10/01/21 : 73 kg (161 lb)  09/24/21 : 72.6 kg (160 lb)  08/28/21 : 72.6 kg (160 lb)  08/17/21 : 72.6 kg (160 lb)        Past Medical History:  Patient Active Problem List:     BMI 33.0-33.9,adult     Pure hypercholesterolemia     Family history of malignant neoplasm of gastrointestinal tract     Tubular adenoma of colon     Hyperopia with astigmatism and presbyopia     Immature cataract     Hypertension, goal below 140/90     Adenomatous polyp of colon     Squamous 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 eyes     Alcohol abuse     Depression, recurrent (HCC)     Trauma and stressor-related disorder     Nontraumatic complete tear of right rotator cuff     Localized osteoarthritis of left knee     Obesity (BMI 30-39.9)     Chronic pain of left knee     Primary osteoarthritis of left knee     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, left      Allergies:  Review of Patient's Allergies indicates:  No Known Allergies    Meds:  Current Outpatient Medications   Medication Instructions    dexamethasone (DECADRON) 4 mg, Oral, 2 TIMES DAILY WITH MEALS    DULoxetine (CYMBALTA) 60 mg, Oral, DAILY, Stop taking duloxetine '30mg'$     Glucosamine 750 MG TABS Oral    losartan (COZAAR) 25 mg, Oral, DAILY    Omega-3 Fatty Acids (FISH OIL) 1000 MG CAPS Oral    omeprazole (PRILOSEC) 40 mg, Oral,  DAILY    traZODone (DESYREL) 50 mg, Oral, NIGHTLY    Turmeric 500 MG CAPS Oral       Social History:  .Former tobacco abuse, quit > 20 years ago, occasional alcohol, no illicit drugs    Family History:  Brother had a heart attack at age 1    ROS: All other systems reviewed were pertinently negative apart from the history of present illness.    PHYSICAL EXAM:   11/19/21  1351   BP: 117/75   Site: Right Arm   Position: Sitting   Cuff Size: Large   Pulse: 87   Temp: 97.3 F (36.3 C)   TempSrc: Temporal   SpO2: 97%   Weight: 73.9 kg (163 lb)     0 lb (0 kg)  Body mass index is 32.92 kg/m.  General: NAD, well developed, well nourished.  HEENT: Oral mucosa is moist, no icterus, no pallor noted.  NECK: No jugular venous distention, no carotid bruits, 2+ carotid upstrokes.  CHEST: Clear to auscultation, no crackles or rhonchi. Nontender.  HEART:  No heaves or lifts. Regular rate and rhythm. No rubs.  No murmur.  ABDOMEN: NABS. Soft. Nontender and nondistended.   EXTREMITIES: Warm and well perfused. No edema. 2+ pulses.   NEURO: A&O X 3, moves all extremities. Grossly nonfocal.  PSYCHIATRIC: Mood and affect are appropriate.      DATA:  LABS:   Lab Results   Component Value Date    NA 137 10/13/2021    K 4.1 10/13/2021    CL 104 10/13/2021    CO2 22 10/13/2021    BUN 9 10/13/2021    CREAT 0.6 10/13/2021    GLUCOSER 121 10/13/2021    LDL 127 05/14/2020    HDL 73 05/14/2020    TG 204 (H) 05/14/2020    ALT 34 08/03/2021    AST 28 08/03/2021    TSH 0.790 12/10/2016     No results found for: PROBNP  No results found for: BNP      EKG: (on my personal review) 08/03/21. NSR. Possible age undetermined inferior infarct.     4 day Holter Monitor 08/17/21  4 day holter monitor.  Sinus rhythm.  Heart rate 63-122, average 89 bpm.  There were rare PACs and PVCs without couplets or runs.  There were 5 patient triggered events which correlated with sinus rhythm with heart rates 83 to 103 bpm without ectopy.    Echocardiogram 08/2021  1.  Left Ventricle: Global systolic function: Left ventricular ejection fraction by Biplane Simpson's is 68%.   2. Left Ventricle: Regional systolic function: Wall motion: There are no regional wall motion abnormalities.   3. Left Ventricle: Diastolic function: There is normal diastolic function.   4. Aortic Valve: Trileaflet. There is focal calcification at the base of the non coronary cusp.   5. Tricuspid Valve: There is insufficient regurgitation to estimate RVSP.    Pharmacologic nuclear stress test 08/2021  Normal study. No evidence of stress-induced ischemia        The 10-year ASCVD risk score (Arnett DK, et al., 2019) is: 2.9%    Values used to calculate the score:      Age: 55 years      Sex: Female      Is Non-Hispanic African American: No      Diabetic: No      Tobacco smoker: No      Systolic Blood Pressure: 151 mmHg      Is BP treated: No      HDL Cholesterol: 73 mg/dL      Total Cholesterol: 238 mg/dL      Assessment and Plan:    1. s/p left knee arthroplasty, feeling much better.   2. Dyspnea on exertion with moderate level of activities. Likely multifactorial including obesity, deconditioning. Unlikely cardiac given recent normal echo and stress test  3. Family history of premature CAD  4. Former tobacco abuse  5. Obesity  6. Essential HTN, controlled.   7. Dizziness, mostly when standing up. Recent holter monitor unremarkable . Improved.     Plan  Results of holter, echo and stress test were discussed with patient   Continue current cardiovascular medications including Losartan  She was instructed to check blood pressure on a daily basis and keep a log of this.   Patient was instructed to follow a healthy diet and exercise routine, with the goal of loosing 5lb for next visit   Will check lipid panel    Return for follow up in Cardiology Clinic in 4 months. To contact earlier if there are any cardiac related issues.  I spent a total of 30 minutes on this visit on the date of service (total time  includes all activities performed on the date of service such as chart review, time speaking with the patient, documenting and communicating with other providers)   Thank you for the privilege of involving me in this patient's care.  Please feel free to contact me if you have any questions.  This patient encounter note was created using voice-recognition software and in real time. Please excuse any typographical errors that have not been edited out.     Minda Meo, MD  11/19/2021  5PM

## 2021-11-19 NOTE — Telephone Encounter (Signed)
[facility], Stephanie [name] is a 61 year old female has requested a refill of      -  Desyrel       Last Office Visit: 10/28/2021 with Ronette Deter  Last Physical Exam: 02/21/2013     There are no preventive care reminders to display for this patient.     Other Med Adult:  Most Recent BP Reading(s)  10/14/21 : 115/72        Cholesterol (mg/dL)   Date Value   05/14/2020 238     LOW DENSITY LIPOPROTEIN DIRECT (mg/dL)   Date Value   05/14/2020 127     HIGH DENSITY LIPOPROTEIN (mg/dL)   Date Value   05/14/2020 73     TRIGLYCERIDES (mg/dL)   Date Value   05/14/2020 204 (H)         THYROID SCREEN TSH REFLEX FT4 (uIU/mL)   Date Value   08/03/2021 1.460         TSH (THYROID STIM HORMONE) (uIU/mL)   Date Value   12/10/2016 0.790       HEMOGLOBIN A1C (%)   Date Value   04/17/2020 6.2 (H)       No results found for: POCA1C      INR (no units)   Date Value   08/03/2021 1.1   04/22/2008 1.0 (L)   02/07/2005 1.0 (L)       SODIUM (mmol/L)   Date Value   10/13/2021 137       POTASSIUM (mmol/L)   Date Value   10/13/2021 4.1           CREATININE (mg/dL)   Date Value   10/13/2021 0.6        Documented patient preferred pharmacies:    [facility], [place] [address]  Phone: [phone] Fax: [phone]

## 2021-11-21 MED FILL — TRAZODONE 50MG: 90 days supply | Qty: 90 | Fill #0

## 2021-11-23 ENCOUNTER — Other Ambulatory Visit: Payer: Self-pay

## 2021-11-23 ENCOUNTER — Ambulatory Visit
Payer: No Typology Code available for payment source | Attending: Orthopaedic Surgery | Admitting: Rehabilitative and Restorative Service Providers"

## 2021-11-23 ENCOUNTER — Ambulatory Visit (HOSPITAL_BASED_OUTPATIENT_CLINIC_OR_DEPARTMENT_OTHER): Payer: Self-pay | Admitting: Rehabilitative and Restorative Service Providers"

## 2021-11-23 DIAGNOSIS — M25562 Pain in left knee: Secondary | ICD-10-CM | POA: Diagnosis present

## 2021-11-23 DIAGNOSIS — M1712 Unilateral primary osteoarthritis, left knee: Secondary | ICD-10-CM | POA: Diagnosis not present

## 2021-11-23 DIAGNOSIS — Z96652 Presence of left artificial knee joint: Secondary | ICD-10-CM | POA: Diagnosis present

## 2021-11-23 DIAGNOSIS — G8929 Other chronic pain: Secondary | ICD-10-CM | POA: Diagnosis present

## 2021-11-23 NOTE — Progress Notes (Signed)
S: Pt reports 5/10 pain today since Friday, she did a lot of exercise, thinks she needs much more PT sessions.  O: Refer to Rehabilitation Treatment Flowsheet     11/23/21 1100   Language Information   Language of Care Portuguese   Interpreter Yes   Name/ID [email or web address] [id number]   Precautions   Precautions Yes   LE Precautions   L Total Knee replacement 10/12/21   Date of surgery 10/12/21   Weight Bearing Status   LLE FWB   Rehab Discipline   Rehab Discipline PT   Visit   Visit number 8   POC Due date 12/23/21   Time Calculation   Start Time 1100   Stop Time 1130   Time Calculation (min) 30 min   Pain   Pain Score 5    Ther Exercise   Exercise Quad Set   Holds 30   Ther Exercise 2   Holds 2 \   Ther Exercise 3   Exercise 3 heel slides w/ strap   Holds 3 30   Ther Exercise 4   Exercise 4 Sit-stand   Holds 4 30   Patient Education   What was taught? Re-cert, HEP (quad set, heel slide, bridge, s/l hip abd)   Method Verbal   Patient comprehension Yes     Gladstone Pih, PT, Lic # [id number]  A: PT conducted recert, pt in need of continued skilled PT.  Pt tol therex without pain, she is progressing overall with AROM and strength.  P: cont LLE AROM and strengthening.    Gladstone Pih, PT, Lic # [id number]

## 2021-11-23 NOTE — Progress Notes (Signed)
OUTPATIENT RE-CERTIFICATION    Referring Provider:   Denice Paradise, PA-C     [place],  [place] [address]       Precautions: L TKR 10/12/21, HTN.    SUBJECTIVE  Hx of Present Illness: Pt is a 61 year old female who presents to physical therapy with a physician diagnosis of Primary osteoarthritis of left knee [M17.12] s/p L TKR by Dr. Claris Gladden Deck on 10/12/21.  Pt reports having a lot of pain when walking, performing stairs.    11/23/2021: Pt is a 61 y/o F who has been regularly presenting to PT for treatment of primary OA of L knee, pt reports continued difficulty with pain in her L knee, she thinks she needs a lot more PT.  Pt reports pain since Friday around her knee.    Onset Date: 10/12/21.  Onset Code:  ICD-10 Code: Primary osteoarthritis of left knee [M17.12]      Imaging: XR Knee Left 1 or 2 views  Status: Final result     PACS Images    Show images for XR Knee Left 1 or 2 views  Study Result  Order #: [id number]   Accession #: [id number]  Narrative  Exam: Left knee, 1 portable view     INDICATION: 61 years-old Female presents s/p left total knee arthroplasty     COMPARISON: 03/03/2021     FINDINGS:     Bones and Joints: There has been interval left total knee arthroplasty. There are no radiographic findings of hardware complications on this AP portable view. There is no acute fracture or dislocation.   Alignment appears unremarkable.     Soft Tissues: Postoperative changes including gas.      IMPRESSION:     Unremarkable left TKA.         Reviewed and Electronically Signed by: Ralene Bathe MD   Signed Date/Time: 10-13-2021 06:22:17        Signing Physician Signing Date/Time   Ralene Bathe, MD 10/13/2021 6:22 AM         SUBJECTIVE    Prior Level of Function: I    Pain Level: 9/10  11/23/2021: 5/10.    Dominant Extremity:     IADL'S/Work: IMPAIRED: Pt unable to ambulate, perform stairs, lay supine without L knee  pain.    Dressing/Grooming: WNL    Driving/sitting tolerance: WNL    Sleeping: WNL    Aggravating Factors: Laying supine, stairs, walking.    Alleviating Factors: Nothing of note.    Past Medical History: see medical record.    Medications: (Rx Comments, concerns): For a list of current medications review the Medication activity.   Mental Status/Communication: OTHER: Depression, ETOH abuse.     Learns Best: practice, demonstration.      Objective:    11/23/2021:    11/23/21 1100   Language Information   Language of Care Portuguese   Interpreter Yes   Name/ID [email or web address] [phone]   Evaluation Type   Evaluation Type Re-Certification   Rehab Discipline   Rehab Discipline PT   Visit   Visit number 8   POC Due date 12/23/21   Pain   Pain Score 5    Precautions   Precautions Yes   LE Precautions   L Total Knee replacement 10/12/21   Date of surgery 10/12/21   Weight Bearing Status   LLE FWB   LLE Assessment   LLE Assessment X   AROM LLE (degrees)   L Knee  Flexion 0-140 110 Degrees   L Knee Extension 0-130 0 Degrees   PROM LLE (degrees)   L Knee Flexion 0-140 115 Degrees   Strength LLE   L Hip Flexion 4+/5   L Hip Extension 4+/5   L Hip ABduction 4+/5   L Knee Flexion 4+/5   L Knee Extension 4+/5   L Ankle Dorsiflexion 4+/5   Clinical Special Tests   Special Tests No   Sensation   Light Touch No apparent deficits   Coordination   Gross Motor Performance Observation Stiff;Weak   Functional Mobility   Gait symmetrical with no AD   Assistive devices None   Balance poor on L LE   Palpation   Tenderness to Palpation general about LLE   Increased Tissue Density general about LLE   LE Joint Mobility (0-6 scale)   LE Mobility assessment  Yes   Left Knee Patellofemoral   L Patellofemoral Superior 3/6   L Patellofemoral Inferior 3/6   L Patellofemoral Medial 3/6   L Patellofemoral Lateral 3/6   Patient Education   What was taught? Re-cert, HEP (quad set, heel slide, bridge, s/l hip abd)   Method Verbal   Patient comprehension  Yes     Gladstone Pih, PT, Lic # [id number]       20/25/42 1000   Language Information   Language of Care Portuguese   Interpreter Yes   Name/ID Grandaughter   Evaluation Type   Evaluation Type Initial Evaluation   Rehab Discipline   Rehab Discipline PT   Visit   Visit number 1   POC Due date 11/18/21   Pain   Pain Score 9    Precautions   Precautions Yes   LE Precautions   L Total Knee replacement 10/12/21   Date of surgery 10/12/21   Weight Bearing Status   LLE FWB   LLE Assessment   LLE Assessment X   AROM LLE (degrees)   L Knee Flexion 0-140 80 Degrees   L Knee Extension 0-130 0 Degrees   PROM LLE (degrees)   L Knee Flexion 0-140 90 Degrees   Strength LLE   L Hip Flexion 4/5   L Hip Extension 4/5   L Hip ABduction 4/5   L Knee Flexion 4/5   L Knee Extension 4/5   L Ankle Dorsiflexion 4/5   Clinical Special Tests   Special Tests No   Sensation   Light Touch No apparent deficits   Coordination   Gross Motor Performance Observation Stiff;Weak   Functional Mobility   Gait Antalgic L LE with RW   Assistive devices Rolling walker   Balance fair on L LE   Palpation   Tenderness to Palpation general about LLE   Increased Tissue Density general about LLE   LE Joint Mobility (0-6 scale)   LE Mobility assessment  Yes   Left Knee Patellofemoral   L Patellofemoral Superior 3/6   L Patellofemoral Inferior 3/6   L Patellofemoral Medial 3/6   L Patellofemoral Lateral 3/6   Patient Education   What was taught? role of PT, plan of care, HEP (quad set, heel slide, bridge, s/l hip abd)   Method Verbal   Patient comprehension Yes     Gladstone Pih, PT, Lic # [id number]    FUNCTIONAL DEFICITS: Pt unable to ambulate, perform stairs, lay supine without L knee pain.  FUNCTIONAL OUTCOME MEASURES:       Physical Therapy Plan of Care  NA:[id number] Rico Junker, MD  Referring Provider:   Denice Paradise, PA-C     [place],  [place] [address]     Diagnosis: Primary osteoarthritis of left knee  [M17.12]    11/23/2021: Assessment/Objective Findings: Patient is a 61 year old female with continued complaints of pain in her left knee following TKR.  The following PT problems were noted upon Re-evaluation: Improved but still decreased strength of L knee flexion and extension, decreased L knee flexion AROM, symmetrical gait, poor SLS LLE, TTP and diffuse joint swelling of L knee. These problems continue to limit the patient with the following functional activities: Pt unable to ambulate, perform stairs, lay supine without L knee pain. This patient will continue to benefit from skilled PT plan of care. The prescribed treatment plan of care is medically necessary secondary to Primary osteoarthritis of left knee [M17.12].    Assessment/Objective Findings: Patient is a 61 year old female with complaints of pain in her left knee following TKR.  The following PT problems were noted upon evaluation: decreased strength of L knee flexion and extension, decreased L knee flexion AROM, antalgic gait with RW LLE, fair SLS LLE, TTP and diffuse joint swelling of L knee. These problems limit the patient with the following functional activities: Pt unable to ambulate, perform stairs, lay supine without L knee pain. This patient will benefit from skilled PT plan of care. The prescribed treatment plan of care is medically necessary secondary to Primary osteoarthritis of left knee [M17.12].  Co-morbidities of   Patient Active Problem List:        BMI 33.0-33.9,adult     Pure hypercholesterolemia     Family history of malignant neoplasm of gastrointestinal tract     Tubular adenoma of colon     Hyperopia with astigmatism and presbyopia     Immature cataract     Hypertension, goal below 140/90     Adenomatous polyp of colon     Squamous 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 eyes     Alcohol abuse     Depression, recurrent (HCC)     Trauma and stressor-related disorder     Nontraumatic complete tear of right rotator cuff      Localized osteoarthritis of left knee     Obesity (BMI 30-39.9)     Chronic pain of left knee     Primary osteoarthritis of left knee     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  In my professional opinion this patient requires skilled physical therapy to address the concerns of the functional limitations outlined above.    Short Term Functional Goals: 4 weeks.   Pt will demonstrate independence and compliance with HEP in 2 weeks 11/23/2021: MET  Pt will demonstrate improved ROM of L knee flexion to 110 degrees in 4 weeks 11/23/2021: MET  Pt will demonstrate improved SLS of LLE to 10 seconds LLE on solid surface in 4 weeks 11/23/2021: NOT MET  Pt will demonstrate improved strength of LLE by 1 MMT Grade in all ranges in 4 weeks 11/23/2021: MET  Pt will demonstrate improved gait mechanics by no antalgic gait LLE in 4 weeks  11/23/2021: MET  Pt will demonstrate improved tolerance of laying supine by 4/10 pain in 4 weeks 11/23/2021: NOT MET    Long Term Goal: 8 weeks.   Pt will demonstrate ability  to perform stairs without LLE pain in 8 weeks.  Pt will demonstrate ability to lay supine without LLE pain in 8 weeks.    Treatment Plan: ** PT Eval - Low Complexity (CPT 97161)  ** PT Re-Eval (CPT [phone])  ** Stretching/ROM/Therapeutic Exercise [phone])  ** Home Exercise Program/ Patient Education (CPT [phone])  ** Neuromuscular Re-education (CPT [phone])  ** Manual Therapy / Joint / Soft tissue Mobilization (CPT 97140)  ** Gait Training (CPT [phone])  ** Functional Activities (CPT 97530)    Recommend Physical Therapy be continued 1 times per week for 8 weeks.  The rehabilitation potential for this patient is excellent  Based on comprehensive examination and evaluation, this patient meets criteria for LOW complexity due to combined factor(s) of stable presentation of 1-2 body segment(s).    Patient Regis Bill is aware of attendance policy: Yes  Plan of care  discussed with Patient/Family: Yes  Patient goals reviewed and incorporated in plan of care: Yes  Patient/Family agrees with plan of care: Yes  Patient/Family education: Yes  Does patient feel safe at home:  Yes      Gladstone Pih, PT, DPT, HFS #[id number]      Gladstone Pih, PT, Lic # [id number]

## 2021-11-25 ENCOUNTER — Other Ambulatory Visit: Payer: Self-pay

## 2021-11-25 ENCOUNTER — Ambulatory Visit (HOSPITAL_BASED_OUTPATIENT_CLINIC_OR_DEPARTMENT_OTHER): Payer: No Typology Code available for payment source | Admitting: Surgical

## 2021-11-25 ENCOUNTER — Ambulatory Visit
Admission: RE | Admit: 2021-11-25 | Discharge: 2021-11-25 | Disposition: A | Discharge status: Home / Self Care | Payer: No Typology Code available for payment source | Attending: Surgical | Admitting: Surgical

## 2021-11-25 DIAGNOSIS — M25462 Effusion, left knee: Secondary | ICD-10-CM

## 2021-11-25 DIAGNOSIS — Z96652 Presence of left artificial knee joint: Secondary | ICD-10-CM

## 2021-11-25 NOTE — Progress Notes (Signed)
Orthopedic Problem List:   S/p L TKR, DOS 10/12/21    HPI: This is a 61 year old year old [place] speaking female who presents for 6 week postop for L TKR. She is ambulating steadily with no assistive devices. She denies any fever, chills, recent injuries or distal numbness or tingling. She attends outpatient PT twice a week and doing her home exercises. She takes Tylenol as needed for pain.     Physical Exam:  Vital Signs: LMP 07/11/2005   Gen: Alert and oriented, No acute distress. Pleasant and appropriate.   HEENT: Head atraumatic, normocephalic. Conjunctivae clear. EOMI.  Respiratory: No labored breathing. Speaking in full sentences  MSK: LLE: incision is clean, dry and intact with steri strips. No erythema or ecchymosis. Mild swelling to knee joint. Grossly stable to varus and valgus stressors. AROM 0-90 degrees. Thigh and calf soft and non-tender. +EHL/FHL, NVI distally.    Assessment and Plan: This is a 61 year old year old [place] speaking female who presented for 6 week postop for L TKR, doing well. Continue PT and HEP. 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 Continue pain regimen as needed. Next followup in 2 months.    The patient was seen independently in the PA clinic.    I spent a total of 20 minutes on this visit on the date of service (total time includes all activities performed on the date of service).    I reviewed the likely diagnosis, the prognosis, and various treatment options in detail. Risks and benefits of treatment plan discussed. The patient's questions have been answered, and the patient understands agrees with treatment plan.    This note was prepared using voice recognition software. Please disregard any transcription errors.      Ronette Deter, PA-C  [facility]eeper [phone]

## 2021-11-26 ENCOUNTER — Telehealth (HOSPITAL_BASED_OUTPATIENT_CLINIC_OR_DEPARTMENT_OTHER): Payer: Self-pay | Admitting: Rehabilitative and Restorative Service Providers"

## 2021-11-26 ENCOUNTER — Ambulatory Visit (HOSPITAL_BASED_OUTPATIENT_CLINIC_OR_DEPARTMENT_OTHER): Payer: No Typology Code available for payment source | Admitting: Rehabilitative and Restorative Service Providers"

## 2021-11-26 NOTE — Telephone Encounter (Signed)
Called patient regarding today's missed appointment. Left message reminding patient of upcoming appointment on 4/3 @ 11am;  Department phone number left for cancellations.    [place], [place], [place] # [phone]

## 2021-11-27 NOTE — Progress Notes (Signed)
I certify that the documented Treatment Plan is reasonable and necessary.    11/27/2021  Lemar Livings, MD

## 2021-11-30 ENCOUNTER — Ambulatory Visit (HOSPITAL_BASED_OUTPATIENT_CLINIC_OR_DEPARTMENT_OTHER): Payer: No Typology Code available for payment source | Admitting: Rehabilitative and Restorative Service Providers"

## 2021-11-30 ENCOUNTER — Other Ambulatory Visit: Payer: Self-pay

## 2021-11-30 ENCOUNTER — Encounter (HOSPITAL_BASED_OUTPATIENT_CLINIC_OR_DEPARTMENT_OTHER): Payer: Self-pay | Admitting: Family Medicine

## 2021-11-30 ENCOUNTER — Ambulatory Visit: Payer: No Typology Code available for payment source | Attending: Family Medicine | Admitting: Family Medicine

## 2021-11-30 VITALS — BP 120/80 | HR 84 | Temp 97.2°F | Ht 59.0 in | Wt 168.0 lb

## 2021-11-30 DIAGNOSIS — Z96652 Presence of left artificial knee joint: Secondary | ICD-10-CM

## 2021-11-30 DIAGNOSIS — R55 Syncope and collapse: Secondary | ICD-10-CM | POA: Insufficient documentation

## 2021-11-30 DIAGNOSIS — E669 Obesity, unspecified: Secondary | ICD-10-CM | POA: Insufficient documentation

## 2021-11-30 DIAGNOSIS — M1712 Unilateral primary osteoarthritis, left knee: Secondary | ICD-10-CM | POA: Insufficient documentation

## 2021-11-30 DIAGNOSIS — G8929 Other chronic pain: Secondary | ICD-10-CM | POA: Insufficient documentation

## 2021-11-30 DIAGNOSIS — E7800 Pure hypercholesterolemia, unspecified: Secondary | ICD-10-CM | POA: Insufficient documentation

## 2021-11-30 DIAGNOSIS — Z6833 Body mass index (BMI) 33.0-33.9, adult: Secondary | ICD-10-CM | POA: Insufficient documentation

## 2021-11-30 DIAGNOSIS — M25562 Pain in left knee: Secondary | ICD-10-CM | POA: Insufficient documentation

## 2021-11-30 DIAGNOSIS — R229 Localized swelling, mass and lump, unspecified: Secondary | ICD-10-CM | POA: Diagnosis present

## 2021-11-30 LAB — LIPID PANEL
Cholesterol: 237 mg/dL (ref 0–239)
HIGH DENSITY LIPOPROTEIN: 49 mg/dL (ref 40–60)
LOW DENSITY LIPOPROTEIN DIRECT: 135 mg/dL (ref 0–189)
TRIGLYCERIDES: 254 mg/dL — ABNORMAL HIGH (ref 0–150)

## 2021-11-30 MED ORDER — INSULIN PEN NEEDLE 31G X 8 MM MISC
0 refills | Status: DC
Start: 2021-11-30 — End: 2022-06-15

## 2021-11-30 MED ORDER — LIRAGLUTIDE 18 MG/3ML SC SOPN
PEN_INJECTOR | SUBCUTANEOUS | 0 refills | Status: DC
Start: 2021-11-30 — End: 2022-03-03

## 2021-11-30 MED ORDER — LOSARTAN POTASSIUM 25 MG PO TABS
25.0000 mg | ORAL_TABLET | Freq: Every day | ORAL | 2 refills | Status: DC
Start: 2021-11-30 — End: 2022-10-04

## 2021-11-30 MED FILL — UNIFINE PNTP MIS 31GX8MM: 90 days supply | Qty: 100 | Fill #0

## 2021-11-30 MED FILL — VICTOZA INJ 18MG/3ML: 27 days supply | Qty: 6 | Fill #0

## 2021-11-30 NOTE — Progress Notes (Signed)
BP 120/80    Pulse 84    Temp 97.2 F (36.2 C) (Temporal)    Ht '4\' 11"'$  (1.499 m)    Wt 76.2 kg (168 lb)    LMP 07/11/2005    SpO2 98%    BMI 33.93 kg/m     S:    Stephanie [name] is a 61 year old female who presents with:    Skin     BRIEF DIET QUESTIONNAIRE:     Average Day (e.g. Yesterday):     Breakfast: milk  Lunch: rice, bean, meat, salad - tomatoes cucumbers  Dinner: same thing, or skips, rice  Snacks: none  Drinks: water not much but more at night  Was very dry mouth at night     Where eating meals (e.g. Table, car, computer/TV): table    How much time to eat a meal?: fast     Thinks could cut carbs - doesn't use sugar, rare coffee only with milk almond       Past Medical History:  02/12/2016: Chronic right shoulder pain  No date: Depression  03/20/2012: Diverticulosis of colon      Comment:  Noted on colonoscopy 6/13   08/16/2010: Elevated hemoglobin A1c      Comment:  HEMOGLOBIN A1C (%) Date Value 03/06/2015 5.6                05/20/2014 5.4 05/08/2012 6.0 (H)   No results found for:               POCA1C   11/21/2012: Hyperopia with astigmatism and presbyopia  No date: Hypertension  11/21/2012: Immature cataract  01/21/2012: Impingement syndrome of right shoulder      Comment:  10/14: pt taking  [place] med (meloxicam 7.5 mg BID                and cyclobenzaprine); Will ask for refill once supply                finished; PCP ok with refill of flexeril on temporary                basis;   01/26/2008: Irritable bladder      Comment:  Seen by Dr Jeraldine Loots 5/09, no incontinence, likely                irritable bladder.  Trial of anticholinergic, refer to                urology, has appt Dr Minna Antis 02/23/08  02/16/2005: Leiomyoma of uterus, unspecified      Comment:  Dr Jeraldine Loots hysterectomy November, 2006; cervix retained,               last pap 11/11 neg Repeat pap/HPV 07/2015   02/15/2014: Mass of hand  07/30/2011: Other plastic surgery for unacceptable cosmetic appearance      Comment:  Breast augmentation scheduled  08/02/11;  Dr.  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    ROS: Per HPI. No reported headaches, weakness/falls, chest pain, shortness of breath, abdominal pain/nausea/vomiting,  numbness/tingling, fevers or chills.     Medications, allergies, PMH and SH reviewed and updated as necessary in Epic.    O:  PHYSICAL EXAM:  BP 120/80    Pulse 84    Temp 97.2 F (36.2 C) (Temporal)    Ht '4\' 11"'$  (1.499 m)    Wt 76.2 kg (168 lb)    LMP 07/11/2005    SpO2 98%    BMI 33.93 kg/m   GEN: NAD  HEENT: MMM  SKIN: WWP  CHEST: Breathing comfortably  NEURO: No focal deficits noted, alert  MSK: Normal gait observed  PSYCH: Appears stated age, appropriate grooming and dress, appropriate affect and mood congruent  KNEE: Huge surgical scar    A/P:  Stephanie [name] is a 61 year old female presents with:    1. 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 Syncope and collapse  Sig improved   Saw cards  Normal testing    3. Skin mass  Needs removal probably   - REFERRAL TO DERMATOLOGY (INT)  - REFERRAL TO E-CONSULTATION TELEDERMATOLOGY    4. BMI 33.0-33.9,adult  reviewed victoza and no fhx of endocrine cancers, reviewed GI upset at start and slow uptitrate    - losartan (COZAAR) 25 MG tablet; Take 1 tablet by mouth in the morning.  Dispense: 90 tablet; Refill: 2  - liraglutide (VICTOZA) 18 MG/3ML pen injector; Start 0.'6mg'$  x 1 wk, then 1.'2mg'$  x 1 wk and then 1.'8mg'$  x 1 wk  Dispense: 3 mL; Refill: 0  - Insulin Pen Needle 31G X 8 MM; Inject under the skin  Dispense: 100 each; Refill: 0    5. Chronic pain of left knee  - losartan (COZAAR) 25 MG tablet; Take  1 tablet by mouth in the morning.  Dispense: 90 tablet; Refill: 2    6. Obesity (BMI 30-39.9)  - liraglutide (VICTOZA) 18 MG/3ML pen injector; Start 0.'6mg'$  x 1 wk, then 1.'2mg'$  x 1 wk and then 1.'8mg'$  x 1 wk  Dispense: 3 mL; Refill: 0  - Insulin Pen Needle 31G X 8 MM; Inject under the skin  Dispense: 100 each; Refill: 0      We discussed the patients current medications. The patient expressed understanding and no barriers to adherence were identified.    F/u as needed    Health Maintenance:  Reviewed HM    Lemar Livings, MD

## 2021-11-30 NOTE — Progress Notes (Signed)
S: Pt reports feeling good today, no pain, 0/10.  O: Refer to Rehabilitation Treatment Flowsheet     11/30/21 1100   Language Information   Language of Care Portuguese   Interpreter Yes   Name/ID [phone]   Precautions   Precautions Yes   LE Precautions   L Total Knee replacement 10/12/21   Date of surgery 10/12/21   Weight Bearing Status   LLE FWB   Rehab Discipline   Rehab Discipline PT   Visit   Visit number 9   POC Due date 12/23/21   Time Calculation   Start Time 1108  (pt tardy)   Stop Time 1130   Time Calculation (min) 22 min   Pain   Pain Score 0    Ther Exercise   Exercise Quad Set   Holds 30   Ther Exercise 2   Exercise Bridge   Holds 2 30   Ther Exercise 3   Exercise 3 heel slides w/ strap   Holds 3 30   Ther Exercise 4   Exercise 4 Sit-stand   Holds 4 30 with 20# KB   Patient Education   What was taught? cont HEP   Method Verbal   Patient comprehension Yes     Gladstone Pih, PT, Lic # [id number]  A: Pt tol therex without pain, she is progressing in strength and AROM.  Pt in need of continued skilled PT.  P: cont LE strengthening and AROM.    Gladstone Pih, PT, Lic # [id number]

## 2021-12-01 ENCOUNTER — Other Ambulatory Visit (HOSPITAL_BASED_OUTPATIENT_CLINIC_OR_DEPARTMENT_OTHER): Payer: No Typology Code available for payment source | Admitting: Dermatology

## 2021-12-01 DIAGNOSIS — D485 Neoplasm of uncertain behavior of skin: Secondary | ICD-10-CM

## 2021-12-01 NOTE — e-consult (Signed)
Thank you for referring your patient to me with use of the teledermatology system. I am basing my recommendations upon the information provided by the referring physician, including images, and upon review of the problem list, medications, and allergies as documented in the electronic medical record. I have not had the benefit of personally interviewing or physically examining the patient.        Consultation request:  lesion near L temple firm nodule growing quickly per patient - please eval for removal - see media ? [place]      Image interpretation:  Photo from 11/30/2021 reviewed:  - L temple with a skin-colored possibly subcutaneous nodule/papule with perhaps overlying telangiectasias   - L cheek with small pink papule     Assessment:  My differential for the lesion of concern includes an epidermal inclusion cyst vs a lipoma vs neurofibroma vs a BCC. I recommend follow-up in person in dermatology clinic.     On the L cheek there is a small red papule which could be a nevus vs angioma vs acneiform papule vs early BCC. I recommend follow-up in person in dermatology clinic for further evaluation.      Recommendations:  - I will arrange a f/u in 2-3 weeks. Please also place an order for a referral to dermatology clinic.         Time spent on this e-Consultation :  >70mn     Please let me know whether I can be of further assistance and please let me know how the patient responds to your management.  Sincerely,    CHarolyn Rutherford MD  [facility]

## 2021-12-02 ENCOUNTER — Other Ambulatory Visit (HOSPITAL_BASED_OUTPATIENT_CLINIC_OR_DEPARTMENT_OTHER): Payer: Self-pay | Admitting: Family Medicine

## 2021-12-07 ENCOUNTER — Other Ambulatory Visit: Payer: Self-pay

## 2021-12-07 ENCOUNTER — Ambulatory Visit (HOSPITAL_BASED_OUTPATIENT_CLINIC_OR_DEPARTMENT_OTHER): Payer: No Typology Code available for payment source | Admitting: Rehabilitative and Restorative Service Providers"

## 2021-12-07 DIAGNOSIS — G8929 Other chronic pain: Secondary | ICD-10-CM | POA: Diagnosis present

## 2021-12-07 DIAGNOSIS — Z96652 Presence of left artificial knee joint: Secondary | ICD-10-CM | POA: Diagnosis present

## 2021-12-07 DIAGNOSIS — M25562 Pain in left knee: Secondary | ICD-10-CM | POA: Diagnosis present

## 2021-12-07 DIAGNOSIS — M1712 Unilateral primary osteoarthritis, left knee: Secondary | ICD-10-CM | POA: Diagnosis present

## 2021-12-07 NOTE — Progress Notes (Signed)
S:  Patient reports L knee today "not so good"; 7/10 L knee pain today; pt thinks she needs more PT visit per week.     O:  Refer to rehab tx flow sheet; discussed healing and rehab process from TKR and that she is progressing and some of the pain and discomfort is normal at this stage.      A:  Good/fair eccentric control w/ anterior and lateral step ups/downs.  VC to promote ROM with HS curls.     P:  Cont to promote ROM and strengthening; add lateral TB walks NV.         12/07/21 1200   Language Information   Language of Care Portuguese   Interpreter Yes   Name/ID [place]   Precautions   Precautions Yes   LE Precautions   L Total Knee replacement 10/12/21   Date of surgery 10/12/21   Weight Bearing Status   RLE FWB   Rehab Discipline   Rehab Discipline PT   Visit   Visit number 10   POC Due date 12/23/21   Time Calculation   Start Time 1200   Stop Time 1227   Time Calculation (min) 27 min   Pain   Pain Score 7    Manual Therapy   Manual Therapy Yes   Technique STM EOB medial/lateral L knee   Time in minutes 6'   Manual Therapy 2   Technique PFJ mobs   Time in minutes 2'   Ther Exercise   Therapeutic Exercise? Yes   Ther Exercise 6   Exercise 6 R global full rotation   Holds 6 6'   Ther Exercise 7   Exercise 7 anterior step ups/downs   Reps 7 10   Sets 7 1   Ther Exercise 8   Exercise 8 lateral step ups/downs   Reps 8 10   Sets 8 1   Ther Exercise 9   Exercise 9 HS curl YPB   Reps 9 10   Sets 9 2   Patient Education   What was taught? HEP add step ups/down   Method Verbal;Demo;Practice   Patient comprehension Yes       Lanae Crumbly, PTA, Lic # [id number]

## 2021-12-10 ENCOUNTER — Ambulatory Visit: Payer: No Typology Code available for payment source | Attending: Orthopaedic Surgery | Admitting: Orthopaedic Surgery

## 2021-12-10 ENCOUNTER — Other Ambulatory Visit: Payer: Self-pay

## 2021-12-10 ENCOUNTER — Encounter (HOSPITAL_BASED_OUTPATIENT_CLINIC_OR_DEPARTMENT_OTHER): Payer: Self-pay | Admitting: Orthopaedic Surgery

## 2021-12-10 DIAGNOSIS — Z96652 Presence of left artificial knee joint: Secondary | ICD-10-CM

## 2021-12-10 DIAGNOSIS — M1711 Unilateral primary osteoarthritis, right knee: Secondary | ICD-10-CM | POA: Diagnosis present

## 2021-12-10 MED ORDER — METHYLPREDNISOLONE ACETATE 80 MG/ML IJ SUSP
80.00 mg | Freq: Once | INTRAMUSCULAR | 0 refills | Status: AC
Start: 2021-12-10 — End: 2021-12-10

## 2021-12-10 NOTE — Progress Notes (Addendum)
Orthopedic Office Note    CC: Patient presents with:  Follow Up: L knee      ORTHOPEDIC DIAGNOSIS LIST:  1. S/p L TKR, DOS 10/12/21  2.  Right knee OA    HPI: Stephanie [name] is a 61 year old female who presents for follow-up for her knees.  She is 2 months out from left TKA.  She is doing well overall.  Last week she noticed a scab at the distal end of her incision.  The scab was removed and some pus came out.  She was concerned so she made this appointment more urgently.  No increase in pain.  No redness. no systemic symptoms.    She continues to report medial knee pain.  It is gradually improving since surgery.    She works in Aeronautical engineer for herself.  She has a woman who works with her.  No one has been substituting her during her absent her assistant is helping another housecleaner while she is gone.  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  She is concerned about losing business.    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  She noticed that the following day she had increased knee pain but today she feels like it is back to baseline.  She takes Tylenol and ibuprofen as needed.    She notes that right knee has been bothering her more since the left knee surgery.  She thinks that she is relying on it more as her left knee recovers.  Pain is worse medially.  It is worse with walking and weightbearing in general.    ROS:  Reviewed by me and all other systems are negative except as noted in HPI.    IMAGING: x-ray of the left knee 11/25/2021 reveals total knee prosthesis in good position without evidence of hardware complication.  X-ray of the right knee 06/13/2021 with bilateral standing view 03/03/2021 reveals right knee degenerative changes.  The medial joint line is narrowed.  Imaging was reviewed by myself and Dr. Madelaine Etienne during this visit.     PHYSICAL EXAM:  GENERAL: Alert and oriented, in no acute  distress  MOOD: Appropriate.   MUSCULOSKELETAL: Evaluation of the left knee reveals no gross bony deformities.  Her skin is warm, dry .  Anterior incision with small scab at the distal end.  There is no drainage.  There is no erythema, no increased warmth.  Mild edema.  No ecchymosis or effusion.  She is NTTP.  Decreased sensation lateral knee.  Ligaments are stable to varus and valgus stress. ROM 0-110 degrees.  She is neurovascularly intact distally.  Evaluation of the right knee reveals no gross bony deformities.  Her skin is warm, dry and intact. There is no erythema, edema, ecchymosis or effusion.  She has TTP along the medial joint line. Ligaments are stable to varus and valgus stress. ROM 0-1 20 degrees.  She is neurovascularly intact distally.    ASSESSMENT/PLAN: 61 year old female who presents with follow-up for bilateral knees.     Evaluation of the small scab at the distal end of the incision is benign.  Likely suture abscess.  No concerns for deeper infection at this time.  Her pain continues to improve.  She did have increased pain after doing a light job 2 days ago so we recommend she remain out of work for another  month.  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  Note provided to stay off for another month.    She has a follow-up in 6 weeks which she will keep.    We reviewed the likely diagnosis, the prognosis, and various treatment options in detail. Risks and benefits of treatment plan discussed. The patient's questions have been answered, and the patient understands agrees with treatment plan.     Dr. Cruzita Lederer Deck saw and examined the patient and formulated the assessment and plan.      [name], PA-C, 12/10/2021    Patient seen and examined with [name], PA-C.  This note was written by Lovena Le and edited by me so that it reflects my findings, assessment and plan. I agree with the  note. I spent more than 50% of this joint visit with the patient and did the substantive part of the visit and decision making.    Pt's left TKR looks fine.  Incision healing well.    Pt does have some right knee OA.  May start feeling better now that her left knee is stronger.  Could always consider an injection.    Deeann Saint, MD, 12/14/2021    Nursing Communication:    ___ [name]:   ___ XOA in splint/cast  ___ XOA out of splint/cast  ___ Cast removal   ___ MRI/CT review  ___ Surgical booking (vitals)  _X__ None

## 2021-12-10 NOTE — Patient Instructions (Signed)
Camila can drive. She can also drink alcohol if she wants. (Not before driving of course)

## 2021-12-17 ENCOUNTER — Other Ambulatory Visit: Payer: Self-pay

## 2021-12-17 ENCOUNTER — Ambulatory Visit (HOSPITAL_BASED_OUTPATIENT_CLINIC_OR_DEPARTMENT_OTHER): Payer: No Typology Code available for payment source | Admitting: Rehabilitative and Restorative Service Providers"

## 2021-12-17 DIAGNOSIS — Z96652 Presence of left artificial knee joint: Secondary | ICD-10-CM

## 2021-12-17 DIAGNOSIS — M25562 Pain in left knee: Secondary | ICD-10-CM | POA: Diagnosis present

## 2021-12-17 DIAGNOSIS — M1712 Unilateral primary osteoarthritis, left knee: Secondary | ICD-10-CM | POA: Diagnosis present

## 2021-12-17 DIAGNOSIS — G8929 Other chronic pain: Secondary | ICD-10-CM | POA: Diagnosis present

## 2021-12-17 NOTE — Progress Notes (Signed)
S:  Patient reports L knee is "doing well"; pain level today 0/10; he is compliant w/ HEP.     O:  Refer to rehab tx flow sheet    A:  Patient tolerated progressed hip strengthening exercises; fair hip hinge mechanics w/ loaded STS.      P:  Re-assess w/ primary PT NV.        12/17/21 1300   Language Information   Language of Care Portuguese   Interpreter Yes   Name/ID [facility]   Precautions   Precautions Yes   LE Precautions   L Total Knee replacement 10/12/21   Date of surgery 10/12/21   Weight Bearing Status   RLE FWB   Rehab Discipline   Rehab Discipline PT   Visit   Visit number 11   POC Due date 12/23/21   Time Calculation   Start Time 1305   Stop Time 1330   Time Calculation (min) 25 min   Pain   Pain Score 0    Manual Therapy   Manual Therapy Yes   Technique STM EOB medial/lateral L knee   Time in minutes 6'   Manual Therapy 2   Technique PFJ mobs   Time in minutes 2'   Ther Exercise   Therapeutic Exercise? Yes   Ther Exercise 2   Exercise Bridge RTB   Holds 2 30   Ther Exercise 4   Exercise 4 Sit-stand   Reps 4 10   Sets 4 1   Holds 4 20# KB   Ther Exercise 5   Exercise 5 lateral stepping RTB   Holds 5 30'x 2 lap   Ther Exercise 6   Exercise 6 R global full rotation   Holds 6 6'   Patient Education   What was taught? HEP add lateral stepping RTB   Method Verbal;Demo;Practice   Patient comprehension Yes       Lanae Crumbly, PTA, Lic # [id number]

## 2021-12-21 ENCOUNTER — Other Ambulatory Visit: Payer: Self-pay

## 2021-12-21 ENCOUNTER — Ambulatory Visit
Payer: No Typology Code available for payment source | Attending: Orthopaedic Surgery | Admitting: Rehabilitative and Restorative Service Providers"

## 2021-12-21 ENCOUNTER — Ambulatory Visit: Payer: No Typology Code available for payment source | Attending: Dermatology | Admitting: Dermatology

## 2021-12-21 DIAGNOSIS — Z96652 Presence of left artificial knee joint: Secondary | ICD-10-CM

## 2021-12-21 DIAGNOSIS — D485 Neoplasm of uncertain behavior of skin: Secondary | ICD-10-CM | POA: Diagnosis present

## 2021-12-21 DIAGNOSIS — I781 Nevus, non-neoplastic: Secondary | ICD-10-CM | POA: Diagnosis present

## 2021-12-21 DIAGNOSIS — D2210 Melanocytic nevi of unspecified eyelid, including canthus: Secondary | ICD-10-CM

## 2021-12-21 DIAGNOSIS — G8929 Other chronic pain: Secondary | ICD-10-CM | POA: Diagnosis present

## 2021-12-21 DIAGNOSIS — M25562 Pain in left knee: Secondary | ICD-10-CM | POA: Diagnosis present

## 2021-12-21 DIAGNOSIS — M1712 Unilateral primary osteoarthritis, left knee: Secondary | ICD-10-CM | POA: Diagnosis present

## 2021-12-21 NOTE — Progress Notes (Signed)
[facility]  [address], [place], [place] [address]    Dermatology Clinic Note    CC: lesions  ?  HPI: Stephanie [name] is a 61 year old female     Lesion on the L temple, present for a few weeks. Bothers her, wants to get rid of it.   ?  ROS: negative, no other skin complaints.  Feels well, no systemic complaints.   ?  Past Dermatologic hx:  ?  PMH:  Patient Active Problem List:     BMI 33.0-33.9,adult     Pure hypercholesterolemia     Family history of malignant neoplasm of gastrointestinal tract     Tubular adenoma of colon     Hyperopia with astigmatism and presbyopia     Immature cataract     Hypertension, goal below 140/90     Adenomatous polyp of colon     Squamous 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 eyes     Alcohol abuse     Depression, recurrent (HCC)     Trauma and stressor-related disorder     Nontraumatic complete tear of right rotator cuff     Localized osteoarthritis of left knee     Obesity (BMI 30-39.9)     Chronic pain of left knee     Primary osteoarthritis of left knee     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  Meds:  ?     Medication List          Accurate as of December 21, 2021  2:03 PM. If you have any questions, ask your nurse or doctor.            CONTINUE taking these medications    dexamethasone 4 MG tablet  Commonly known as: DECADRON     Fish Oil 1000 MG Caps     Glucosamine 750 MG Tabs     Insulin Pen Needle 31G X 8 MM Misc  Inject under the skin     liraglutide 18 MG/3ML pen injector  Commonly known as: VICTOZA  Start 0.'6mg'$  x 1 wk, then 1.'2mg'$  x 1 wk and then 1.'8mg'$  x 1 wk     losartan 25 MG tablet  Commonly known as: COZAAR  Take 1 tablet by mouth in the morning.     omeprazole 40 MG capsule  Commonly known as: PriLOSEC  Take 1 capsule by mouth in the morning.     traZODone 50 MG tablet  Commonly known as: DESYREL  TAKE 1 TABLET BY MOUTH NIGHTLY     Turmeric 500 MG Caps          ?  All:  Review of Patient's Allergies indicates:  No Known Allergies?      SHx:  ?Social History    Tobacco Use      Smoking status: Former        Types: Cigarettes        Quit date: 07/27/2001        Years since quitting: 20.4        Passive exposure: Never      Smokeless tobacco: Never    Alcohol use: Yes      Alcohol/week: 6.0 standard drinks of alcohol      Types: 6 Cans of beer per week      Comment: 6 beers per week     Drug use: No       FHx:  Hx of melanoma - ,  hx of NMSC -  ?  Physical exam/Assessment/Plan:  Well appearing pt in NAD  Mood and affect are wnl  A skin examination was performed including head, eyes, nose,  lips  Skin type: III    # Neoplasm of uncertain behavior   - Left temple with ~1cm subcutaneous nodule, no epidermal changes, favro cyst or lipoma  - refer to plastics for removal, she is ok with the resulting scar    # favor spider angioma, L cheek with small red macule, no concerning features  - benign, reassurance.     # Favor compound nevus  - Right lower lateral lid with 5x4 mm brown papule with non arborizing telangs. DDX also includes pigmented BCC  - photo taken, recheck in 75mo   RTC: 361moTBSE at next visit ( she was 1569mlate to her appt today)  ?  ?  ?  ChaHarolyn RutherfordD  [facility]         CC: OxnOrie FishermanD  [address] [id number]

## 2021-12-21 NOTE — Progress Notes (Signed)
S: Pt reports feeling good today, no pain, doing HEP regularly.  O: Refer to Rehabilitation Treatment Flowsheet     12/21/21 1200   Language Information   Language of Care Portuguese   Interpreter Yes   Name/ID [facility]   Precautions   Precautions Yes   LE Precautions   L Total Knee replacement 10/12/21   Date of surgery 10/12/21   Weight Bearing Status   RLE FWB   Rehab Discipline   Rehab Discipline PT   Visit   Visit number 12   POC Due date 12/23/21   Time Calculation   Start Time 1234   Stop Time 1300   Time Calculation (min) 26 min   Pain   Pain Score 0    Ther Exercise   Exercise Quad Set   Holds 30   Ther Exercise 2   Exercise Bridge GTB   Holds 2 30   Ther Exercise 4   Exercise 4 Sit-stand   Holds 4 20# KB   Ther Exercise 5   Exercise 5 Lat Step Ups   Holds 5 6" stairs x 30   Ther Exercise 6   Exercise 6 Bike 5 Min   Patient Education   What was taught? cont HEP   Method Verbal   Patient comprehension Yes       Gladstone Pih, PT, Lic # [id number]  A: Pt tol therex with building fatigue, tired at end of session however quad control and hip stability are improving week to week.  Pt in need of continued skilled PT.  P: cont LE strengthening.    Gladstone Pih, PT, Lic # [id number]

## 2021-12-28 ENCOUNTER — Ambulatory Visit (HOSPITAL_BASED_OUTPATIENT_CLINIC_OR_DEPARTMENT_OTHER): Payer: No Typology Code available for payment source | Admitting: Rehabilitative and Restorative Service Providers"

## 2021-12-28 ENCOUNTER — Other Ambulatory Visit: Payer: Self-pay

## 2021-12-28 ENCOUNTER — Ambulatory Visit (HOSPITAL_BASED_OUTPATIENT_CLINIC_OR_DEPARTMENT_OTHER): Payer: No Typology Code available for payment source | Admitting: Family Medicine

## 2021-12-28 ENCOUNTER — Ambulatory Visit (HOSPITAL_BASED_OUTPATIENT_CLINIC_OR_DEPARTMENT_OTHER): Payer: Self-pay | Admitting: Rehabilitative and Restorative Service Providers"

## 2021-12-28 DIAGNOSIS — G8929 Other chronic pain: Secondary | ICD-10-CM | POA: Insufficient documentation

## 2021-12-28 DIAGNOSIS — Z96652 Presence of left artificial knee joint: Secondary | ICD-10-CM

## 2021-12-28 DIAGNOSIS — M1712 Unilateral primary osteoarthritis, left knee: Secondary | ICD-10-CM | POA: Diagnosis present

## 2021-12-28 DIAGNOSIS — M25562 Pain in left knee: Secondary | ICD-10-CM | POA: Insufficient documentation

## 2021-12-28 NOTE — Progress Notes (Signed)
OUTPATIENT RE-CERTIFICATION    Referring Provider:  Alwyn Ren, PA-C     [address]  [facility]  [place], [place] [address]       Precautions:L TKR 10/12/21, HTN.    SUBJECTIVE  Hx of Present Illness: Pt is a61 year oldfemalewho presents to physical therapy with a physician diagnosis of Primary osteoarthritis of left knee [M17.12]s/p L TKR by Dr. Finis Bud Deck on 10/12/21.Pt reports having a lot of pain when walking, performing stairs.    11/23/2021: Pt is a 61 y/o F who has been regularly presenting to PT for treatment of primary OA of L knee, pt reports continued difficulty with pain in her L knee, she thinks she needs a lot more PT.  Pt reports pain since Friday around her knee.    12/28/2021: Pt is a 61 y/o F who has been regularly presenting to PT for treatment of primary OA of L knee, pt reports continued difficulty with pain in her L knee.  Pt reports no pain in her knee, only a small pain, would like to continue therapy.  Pt reports traveling and had some pain after traveling.    Onset Date:10/12/21.  Onset Code:  ICD-10 Code:Primary osteoarthritis of left knee [M17.12]      Imaging:XR Knee Left 1 or 2 views  Status: Final result     PACS Images    Show images for XR Knee Left 1 or 2 views  Study Result  Order #: [id number]   Accession #: [id number]  Narrative  Exam: Left knee, 1 portable view     INDICATION: 61 years-old Female presents s/p left total knee arthroplasty     COMPARISON: 03/03/2021     FINDINGS:     Bones and Joints: There has been interval left total knee arthroplasty. There are no radiographic findings of hardware complications on this AP portable view. There is no acute fracture or dislocation.   Alignment appears unremarkable.     Soft Tissues: Postoperative changes including gas.      IMPRESSION:     Unremarkable left TKA.         Reviewed and Electronically Signed by: Toney Reil MD   Signed Date/Time: 10-13-2021 06:22:17         Signing Physician Signing Date/Time   Toney Reil, MD 10/13/2021 6:22 AM         SUBJECTIVE    Prior Level of Function:I    Pain Level:9/10  11/23/2021: 5/10.  12/28/2021: 3/10.    Dominant Extremity:     IADL'S/Work:IMPAIRED:Pt unable to ambulate, perform stairs, lay supine without L knee pain.    Dressing/Grooming:WNL    Driving/sitting tolerance:WNL    Sleeping:WNL    Aggravating Factors:Laying supine, stairs, walking.    Alleviating Factors:Nothing of note.    Past Medical History:see medical record.    Medications: (Rx Comments, concerns): For a list of current medications review the Medication activity.   Mental Status/Communication:OTHER:Depression, ETOH abuse.    Learns Best:practice, demonstration.      Objective:    12/28/2021:    12/28/21 1700   Language Information   Language of Care Portuguese   Interpreter Yes   Name/ID [facility]   Evaluation Type   Evaluation Type Re-Certification   Rehab Discipline   Rehab Discipline PT   Visit   Visit number 13   POC Due date 01/27/22   Pain   Pain Score 4    Precautions   Precautions Yes   LE Precautions  L Total Knee replacement 10/12/21   Date of surgery 10/12/21   Weight Bearing Status   RLE FWB   LLE FWB   LLE Assessment   LLE Assessment X   AROM LLE (degrees)   L Knee Flexion 0-140 110 Degrees   L Knee Extension 0-130 0 Degrees   PROM LLE (degrees)   L Knee Flexion 0-140 115 Degrees   Strength LLE   L Hip Flexion 5-/5   L Hip Extension 5-/5   L Hip ABduction 5-/5   L Knee Flexion 5-/5   L Knee Extension 5-/5   L Ankle Dorsiflexion 5-/5   Clinical Special Tests   Special Tests No   Functional Mobility   Gait symmetrical with no AD   Assistive devices None   Balance Good on L LE   Palpation   Tenderness to Palpation general about LLE   Increased Tissue Density general about LLE   LE Joint Mobility (0-6 scale)   LE Mobility assessment  Yes   Left Knee Patellofemoral   L Patellofemoral Superior 3/6   L Patellofemoral  Inferior 3/6   L Patellofemoral Medial 3/6   L Patellofemoral Lateral 3/6   Patient Education   What was taught? Re-cert, HEP (quad set, heel slide, bridge, s/l hip abd)   Method Verbal   Patient comprehension Yes       Myer Haff, PT, Lic # [id number]      8/41/3244:    11/23/21 1100   Language Information   Language of Care Portuguese   Interpreter Yes   Name/ID [facility] [phone]   Evaluation Type   Evaluation Type Re-Certification   Rehab Discipline   Rehab Discipline PT   Visit   Visit number 8   POC Due date 12/23/21   Pain   Pain Score 5    Precautions   Precautions Yes   LE Precautions   L Total Knee replacement 10/12/21   Date of surgery 10/12/21   Weight Bearing Status   LLE FWB   LLE Assessment   LLE Assessment X   AROM LLE (degrees)   L Knee Flexion 0-140 110 Degrees   L Knee Extension 0-130 0 Degrees   PROM LLE (degrees)   L Knee Flexion 0-140 115 Degrees   Strength LLE   L Hip Flexion 4+/5   L Hip Extension 4+/5   L Hip ABduction 4+/5   L Knee Flexion 4+/5   L Knee Extension 4+/5   L Ankle Dorsiflexion 4+/5   Clinical Special Tests   Special Tests No   Sensation   Light Touch No apparent deficits   Coordination   Gross Motor Performance Observation Stiff;Weak   Functional Mobility   Gait symmetrical with no AD   Assistive devices None   Balance poor on L LE   Palpation   Tenderness to Palpation general about LLE   Increased Tissue Density general about LLE   LE Joint Mobility (0-6 scale)   LE Mobility assessment  Yes   Left Knee Patellofemoral   L Patellofemoral Superior 3/6   L Patellofemoral Inferior 3/6   L Patellofemoral Medial 3/6   L Patellofemoral Lateral 3/6   Patient Education   What was taught? Re-cert, HEP (quad set, heel slide, bridge, s/l hip abd)   Method Verbal   Patient comprehension Yes     Myer Haff, PT, Lic # [id number]       40/34/74 1000   Language Information   Language  of Care Portuguese   Interpreter Yes   Name/ID Grandaughter   Evaluation Type   Evaluation Type Initial  Evaluation   Rehab Discipline   Rehab Discipline PT   Visit   Visit number 1   POC Due date 11/18/21   Pain   Pain Score 9    Precautions   Precautions Yes   LE Precautions   L Total Knee replacement 10/12/21   Date of surgery 10/12/21   Weight Bearing Status   LLE FWB   LLE Assessment   LLE Assessment X   AROM LLE (degrees)   L Knee Flexion 0-140 80 Degrees   L Knee Extension 0-130 0 Degrees   PROM LLE (degrees)   L Knee Flexion 0-140 90 Degrees   Strength LLE   L Hip Flexion 4/5   L Hip Extension 4/5   L Hip ABduction 4/5   L Knee Flexion 4/5   L Knee Extension 4/5   L Ankle Dorsiflexion 4/5   Clinical Special Tests   Special Tests No   Sensation   Light Touch No apparent deficits   Coordination   Gross Motor Performance Observation Stiff;Weak   Functional Mobility   Gait Antalgic L LE with RW   Assistive devices Rolling walker   Balance fair on L LE   Palpation   Tenderness to Palpation general about LLE   Increased Tissue Density general about LLE   LE Joint Mobility (0-6 scale)   LE Mobility assessment  Yes   Left Knee Patellofemoral   L Patellofemoral Superior 3/6   L Patellofemoral Inferior 3/6   L Patellofemoral Medial 3/6   L Patellofemoral Lateral 3/6   Patient Education   What was taught? role of PT, plan of care, HEP (quad set, heel slide, bridge, s/l hip abd)   Method Verbal   Patient comprehension Yes     Myer Haff, PT, Lic # [id number]    FUNCTIONAL DEFICITS:Pt unable to ambulate, perform stairs, lay supine without L knee pain.  FUNCTIONAL OUTCOME MEASURES:       Physical Therapy Plan of Care    UE:[id number] Blima Dessert, MD  Referring Provider:  Alwyn Ren, PA-C     [address]  [facility]  [place], [place] [address]     Diagnosis:Primary osteoarthritis of left knee [M17.12]    12/28/2021: Assessment/Objective Findings:Patient is a61 year oldfemalewith continued complaints of pain in her left knee following TKR. The following PT problems were noted upon  Re-evaluation: Improved but still decreased strength of L knee flexion and extension, decreased L knee flexion AROM, symmetrical gait, good SLS LLE, TTP and diffuse joint swelling of L knee. These problems continue to limit the patient with the following functional activities:Pt unable to ambulate, perform stairs, lay supine without L knee pain. This patient will continue to benefit from skilled PT plan of care. The prescribed treatment plan of care is medically necessary secondary toPrimary osteoarthritis of left knee [M17.12].    11/23/2021: Assessment/Objective Findings:Patient is a61 year oldfemalewith continued complaints of pain in her left knee following TKR. The following PT problems were noted upon Re-evaluation: Improved but still decreased strength of L knee flexion and extension, decreased L knee flexion AROM, symmetrical gait, poor SLS LLE, TTP and diffuse joint swelling of L knee. These problems continue to limit the patient with the following functional activities:Pt unable to ambulate, perform stairs, lay supine without L knee pain. This patient will continue to benefit from skilled PT plan  of care. The prescribed treatment plan of care is medically necessary secondary toPrimary osteoarthritis of left knee [M17.12].    Assessment/Objective Findings:Patient is a61 year oldfemalewith complaints of pain in her left knee following TKR. The following PT problems were noted upon evaluation: decreased strength of L knee flexion and extension, decreased L knee flexion AROM, antalgic gait with RW LLE, fair SLS LLE, TTP and diffuse joint swelling of L knee. These problems limit the patient with the following functional activities:Pt unable to ambulate, perform stairs, lay supine without L knee pain. This patient will benefit from skilled PT plan of care. The prescribed treatment plan of care is medically necessary secondary toPrimary osteoarthritis of left knee [M17.12].  Co-morbidities  of  Patient Active Problem List:     BMI 33.0-33.9,adult  Pure hypercholesterolemia  Family history of malignant neoplasm of gastrointestinal tract  Tubular adenoma of colon  Hyperopia with astigmatism and presbyopia  Immature cataract  Hypertension, goal below 140/90  Adenomatous polyp of colon  Squamous 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 eyes  Alcohol abuse  Depression, recurrent (HCC)  Trauma and stressor-related disorder  Nontraumatic complete tear of right rotator cuff  Localized osteoarthritis of left knee  Obesity (BMI 30-39.9)  Chronic pain of left knee  Primary osteoarthritis of left knee  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  In my professional opinion this patient requires skilled physical therapy to address the concerns of the functional limitations outlined above.    Short Term Functional Goals:4 weeks.  Pt will demonstrate independence and compliance with HEP in2weeks 11/23/2021: MET  Pt will demonstrate improved ROM ofL knee flexion to 110 degreesin 4weeks 11/23/2021: MET  Pt will demonstrate improved SLS ofLLEto 10 seconds LLE on solid surfacein 4weeks 11/23/2021: NOT MET 12/28/2021: MET  Pt will demonstrate improved strength ofLLEby 1 MMT Grade in all rangesin 4weeks 11/23/2021: MET  Pt will demonstrate improved gaitmechanicsby no antalgic gait LLEin 4weeks 11/23/2021: MET  Pt will demonstrate improvedtoleranceof laying supineby 4/10 painin 4weeks 11/23/2021: NOT MET 12/28/2021: MET    Long Term Goal:8 weeks.  Pt will demonstrateability to perform stairs without LLE pain in 8 weeks. 12/28/2021: NOT MET  Pt will demonstrateability to lay supine without LLE pain in 8 weeks. 12/28/2021: NOT MET    Treatment Plan:** PT Eval - Low Complexity (CPT 97161)  ** PT Re-Eval (CPT 97164)  ** Stretching/ROM/Therapeutic Exercise (97110)  ** Home  Exercise Program/ Patient Education (CPT [phone])  ** Neuromuscular Re-education (CPT [phone])  ** Manual Therapy / Joint / Soft tissue Mobilization (CPT 97140)  ** Gait Training (CPT [phone])  ** Functional Activities (CPT 97530)    Recommend Physical Therapy be continued1times per weekfor 8weeks.  The rehabilitation potential for this patient isexcellent  Based on comprehensive examination and evaluation, this patient meets criteria forLOWcomplexity due to combined factor(s) of stablepresentation of1-2body segment(s).    Patient Stephanie [name] is aware of attendance policy:Yes  Plan of care discussed with Patient/Family:Yes  Patient goals reviewed and incorporated in plan of care:Yes  Patient/Family agrees with plan of care:Yes  Patient/Family education:Yes  Does patient feel safe at home:Yes      Myer Haff, PT, DPT, HFS #[id number]      Myer Haff, PT, Lic # [id number]

## 2021-12-28 NOTE — Progress Notes (Signed)
S: Pt reports 4/10 pain today, doing well overall.  O: Refer to Rehabilitation Treatment Flowsheet     12/28/21 1700   Language Information   Language of Care Portuguese   Interpreter Yes   Name/ID [facility]   Precautions   Precautions Yes   LE Precautions   L Total Knee replacement 10/12/21   Date of surgery 10/12/21   Weight Bearing Status   RLE FWB   LLE FWB   Rehab Discipline   Rehab Discipline PT   Visit   Visit number 13   POC Due date 01/27/22   Time Calculation   Start Time 1736  (t tardy)   Stop Time 1800   Time Calculation (min) 24 min   Pain   Pain Score 4    Ther Exercise   Exercise SLR   Holds 30   Ther Exercise 2   Exercise Bridge   Holds 2 30   Ther Exercise 3   Exercise 3 S/L Hip Abd   Holds 3 30   Ther Exercise 6   Exercise 6 Bike 5 Min   Patient Education   What was taught? Re-cert, HEP (quad set, heel slide, bridge, s/l hip abd)   Method Verbal   Patient comprehension Yes       Gladstone Pih, PT, Lic # [id number]  A: PT conducted recert, pt in need of continued skilled PT.  Pt tol therex without pain, she is progressing overall.  P: cont LE strengthening and AROM.    Gladstone Pih, PT, Lic # [id number]

## 2021-12-28 NOTE — Progress Notes (Signed)
This encounter was opened in error.  Please disregard.  Patient left without being seen.

## 2021-12-29 NOTE — Progress Notes (Signed)
I certify that the documented Treatment Plan is reasonable and necessary.    12/29/2021  Denice Paradise, PA-C

## 2022-01-06 ENCOUNTER — Encounter (HOSPITAL_BASED_OUTPATIENT_CLINIC_OR_DEPARTMENT_OTHER): Payer: Self-pay

## 2022-01-06 ENCOUNTER — Ambulatory Visit (HOSPITAL_BASED_OUTPATIENT_CLINIC_OR_DEPARTMENT_OTHER): Payer: No Typology Code available for payment source | Admitting: Rehabilitative and Restorative Service Providers"

## 2022-01-13 ENCOUNTER — Ambulatory Visit (HOSPITAL_BASED_OUTPATIENT_CLINIC_OR_DEPARTMENT_OTHER): Payer: No Typology Code available for payment source | Admitting: Rehabilitative and Restorative Service Providers"

## 2022-01-13 ENCOUNTER — Other Ambulatory Visit: Payer: Self-pay

## 2022-01-13 DIAGNOSIS — M1712 Unilateral primary osteoarthritis, left knee: Secondary | ICD-10-CM

## 2022-01-13 DIAGNOSIS — G8929 Other chronic pain: Secondary | ICD-10-CM | POA: Diagnosis present

## 2022-01-13 DIAGNOSIS — Z96652 Presence of left artificial knee joint: Secondary | ICD-10-CM

## 2022-01-13 DIAGNOSIS — M25562 Pain in left knee: Secondary | ICD-10-CM | POA: Diagnosis present

## 2022-01-13 NOTE — Progress Notes (Signed)
S: Pt reports some pain when walking last week while on vacation, this week she has no pain, 0/10.  O: Refer to Rehabilitation Treatment Flowsheet     01/13/22 1300   Language Information   Language of Care English   Interpreter Patient Declined   Precautions   Precautions Yes   LE Precautions   L Total Knee replacement 10/12/21   Date of surgery 10/12/21   Rehab Discipline   Rehab Discipline PT   Visit   Visit number 14   POC Due date 01/27/22   Time Calculation   Start Time 1319   Stop Time 1349   Time Calculation (min) 30 min   Pain   Pain Score 0    Ther Exercise   Exercise SLR   Holds 30   Ther Exercise 2   Exercise Bridge   Holds 2 30   Ther Exercise 3   Exercise 3 S/L Hip Abd   Holds 3 30   Ther Exercise 4   Exercise 4 Sit-stand   Holds 4 20# KB x 30   Ther Exercise 5   Exercise 5 Lat Step Ups   Holds 5 6" stairs x 30   Patient Education   What was taught? cont HEP   Method Verbal   Patient comprehension Yes       Gladstone Pih, PT, Lic # [id number]  A: Pt tol therex well without pain, she is progressing in strength and tolerance to activity, lowered pain score today representative of progression.  P: cont LE strengthening.    Gladstone Pih, PT, Lic # [id number]

## 2022-01-15 ENCOUNTER — Ambulatory Visit (HOSPITAL_BASED_OUTPATIENT_CLINIC_OR_DEPARTMENT_OTHER): Payer: No Typology Code available for payment source | Admitting: Rehabilitative and Restorative Service Providers"

## 2022-01-15 ENCOUNTER — Other Ambulatory Visit: Payer: Self-pay

## 2022-01-15 DIAGNOSIS — M25562 Pain in left knee: Secondary | ICD-10-CM | POA: Diagnosis present

## 2022-01-15 DIAGNOSIS — M1712 Unilateral primary osteoarthritis, left knee: Secondary | ICD-10-CM

## 2022-01-15 DIAGNOSIS — G8929 Other chronic pain: Secondary | ICD-10-CM | POA: Diagnosis present

## 2022-01-15 DIAGNOSIS — Z96652 Presence of left artificial knee joint: Secondary | ICD-10-CM | POA: Diagnosis present

## 2022-01-15 NOTE — Progress Notes (Signed)
S:  Patient reports 0/10 L knee pain; she is compliant w/ HEP. Improvement noted with negotiation of stairs.      O:  Refer to rehab tx flow sheet    A:  Glute med and quad fatigue with added resistance; intermittent cueing to correct side lying position for hip abd; good proprioception and ankle balance strategy with reciprocal cone step over activity.     P:  Cont balance and functional glute activation; TKE for continued quad strengthening; wall squats NV.        01/15/22 1100   Language Information   Language of Care English   Interpreter Patient Declined   Precautions   Precautions Yes   LE Precautions   L Total Knee replacement 10/12/21   Date of surgery 10/12/21   Rehab Discipline   Rehab Discipline PT   Visit   Visit number 15   POC Due date 01/27/22   Time Calculation   Start Time 1130   Stop Time 1155   Time Calculation (min) 25 min   Pain   Pain Score 0    Ther Exercise   Therapeutic Exercise? Yes   Exercise SLR 1#   Holds 30   Ther Exercise 2   Exercise Bridge GTB   Holds 2 30   Ther Exercise 3   Exercise 3 S/L Hip Abd   Holds 3 1# 30x   Ther Exercise 7   Exercise 7 cone step overs (for balance)   Holds 7 6 cones 4 laps   Patient Education   What was taught? HEP add YTB abd   Method Verbal;Demo;Written;Practice   Patient comprehension Yes       Lanae Crumbly, PTA, Lic # [id number]

## 2022-01-18 ENCOUNTER — Ambulatory Visit
Payer: No Typology Code available for payment source | Attending: Rehabilitative and Restorative Service Providers" | Admitting: Rehabilitative and Restorative Service Providers"

## 2022-01-18 ENCOUNTER — Other Ambulatory Visit: Payer: Self-pay

## 2022-01-18 DIAGNOSIS — Z96652 Presence of left artificial knee joint: Secondary | ICD-10-CM | POA: Diagnosis present

## 2022-01-18 DIAGNOSIS — G8929 Other chronic pain: Secondary | ICD-10-CM | POA: Diagnosis present

## 2022-01-18 DIAGNOSIS — M1712 Unilateral primary osteoarthritis, left knee: Secondary | ICD-10-CM

## 2022-01-18 DIAGNOSIS — M25562 Pain in left knee: Secondary | ICD-10-CM | POA: Diagnosis present

## 2022-01-18 NOTE — Progress Notes (Signed)
S: Pt reports having only a little pain today in her R knee.  L knee is ok, 0/10.  O: Refer to Rehabilitation Treatment Flowsheet     01/18/22 1700   Language Information   Language of Care English   Interpreter Patient Declined   Precautions   Precautions Yes   LE Precautions   L Total Knee replacement 10/12/21   Date of surgery 10/12/21   Rehab Discipline   Rehab Discipline PT   Visit   Visit number 16   POC Due date 01/27/22   Time Calculation   Start Time 1705   Stop Time 1730   Time Calculation (min) 25 min   Pain   Pain Score 0    Ther Exercise   Exercise Bridge   Holds 30   Ther Exercise 2   Exercise SLR   Holds 2 30   Ther Exercise 3   Exercise 3 S/L Hip Abd   Holds 3 2#x30   Ther Exercise 4   Exercise 4 Sit-stand   Holds 4 20# KB x 30   Patient Education   What was taught? cont HEP   Method Verbal   Patient comprehension Yes       Gladstone Pih, PT, Lic # [id number]  A: Pt tol therex without pain, she is progressing overall.  Pt gait is symmetrical at this time.  Pt in need of continued skilled PT.  P: cont LE strengthening.    Gladstone Pih, PT, Lic # [id number]

## 2022-01-22 ENCOUNTER — Ambulatory Visit (HOSPITAL_BASED_OUTPATIENT_CLINIC_OR_DEPARTMENT_OTHER): Payer: No Typology Code available for payment source | Admitting: Rehabilitative and Restorative Service Providers"

## 2022-01-27 ENCOUNTER — Ambulatory Visit: Payer: No Typology Code available for payment source | Attending: Orthopaedic Surgery | Admitting: Surgical

## 2022-01-27 ENCOUNTER — Other Ambulatory Visit: Payer: Self-pay

## 2022-01-27 DIAGNOSIS — Z96652 Presence of left artificial knee joint: Secondary | ICD-10-CM | POA: Insufficient documentation

## 2022-01-27 MED ORDER — DICLOFENAC SODIUM 75 MG PO TBEC
75.00 mg | DELAYED_RELEASE_TABLET | Freq: Two times a day (BID) | ORAL | 1 refills | Status: AC
Start: 2022-01-27 — End: 2022-02-26

## 2022-01-27 MED FILL — DICLOFENAC 75MG DR: 30 days supply | Qty: 60 | Fill #0

## 2022-01-28 NOTE — Progress Notes (Signed)
Orthopedic Problem List:   1. S/p L TKR, DOS 10/12/21  2.  Right knee OA  2.  Left knee pes bursitis    HPI: This is a 61 year old year old Portuguese (Brazilian) speaking female who presents for 5 months postop for her left knee replacement. She is doing well but has some pain on her left medial knee. The pain has been there preop and continues. It's non-radiating and stay about the level of the knee. It feels like an intermittent burning pain.She denies any recent injuries or distal numbness or tingling.  She will be prescribed some diclofenac but it did not help.  She takes occasional ibuprofen which works better.  She is working in housekeeping.  She has some pain with work.    She has arthritis of her right knee but is doing okay with this now.    She will be ordered an MRI of her left knee because of persistent left knee pain.    Physical Exam:  Vital Signs: LMP 07/11/2005   Gen: Alert and oriented, No acute distress. Pleasant and appropriate.   HEENT: Head atraumatic, normocephalic. Conjunctivae clear. EOMI.  Respiratory: No labored breathing. Speaking in full sentences  MSK: Left knee: incision is healed. No erythema, ecchymosis or drainage. Mild swelling to knee joint. TTP over the pes left knee.. AROM 0-110 degrees, Grossly stable to varus and valgus stress. Thigh and calf soft and non-tender. NVI distally, +EHL/FHL  Right knee with mild effusion.  Medial joint line tenderness.  Range of motion 0-110.  Stable to varus and valgus stress  Gait is stable which is amazing since she could barely put weight on her left leg without the knee collapsing prior to surgery.    Xrays: Of her left knee 10/3021 show the prosthesis in good position.  Left knee MRI to my read shows a little bit of fluid in the pes bursa    Assessment and Plan: This is a 61 year old year old Portuguese (Brazilian) speaking female who presented for 5 months postop left TKR with persistent medial-sided knee pain.  She appears to have some pes  tendinitis/bursitis.  I offered an injection.  Patient agreed.    After timeout and using sterile technique and topical refrigerant, an injection of 40 mg Depo-Medrol and 2 cc 1% lidocaine was injected into the left knee pes bursa. Patient tolerated the procedure well with no complications.    She does not want an injection in her right knee because it did not really help in the past.    I encouraged her to keep walking, working, doing her exercises, and stretching.      I spent a total of 25 minutes on this visit on the date of service (total time includes all activities performed on the date of service exclusive of any procedures performed).         Mercedes von Deck, MD, 03/21/2022

## 2022-02-01 ENCOUNTER — Ambulatory Visit (HOSPITAL_BASED_OUTPATIENT_CLINIC_OR_DEPARTMENT_OTHER): Payer: No Typology Code available for payment source | Admitting: Rehabilitative and Restorative Service Providers"

## 2022-02-01 ENCOUNTER — Other Ambulatory Visit: Payer: Self-pay

## 2022-02-01 DIAGNOSIS — M1712 Unilateral primary osteoarthritis, left knee: Secondary | ICD-10-CM

## 2022-02-01 DIAGNOSIS — M25562 Pain in left knee: Secondary | ICD-10-CM | POA: Insufficient documentation

## 2022-02-01 DIAGNOSIS — Z96652 Presence of left artificial knee joint: Secondary | ICD-10-CM | POA: Diagnosis present

## 2022-02-01 DIAGNOSIS — G8929 Other chronic pain: Secondary | ICD-10-CM | POA: Insufficient documentation

## 2022-02-01 NOTE — Progress Notes (Signed)
OUTPATIENTRE-CERTIFICATION    Referring Provider:  Denice Paradise, PA-C     [address], [place] [address]       Precautions:L TKR 10/12/21, HTN.    SUBJECTIVE  Hx of Present Illness: Pt is a8 year oldfemalewho presents to physical therapy with a physician diagnosis of Primary osteoarthritis of left knee [M17.12]s/p L TKR by Dr. Claris Gladden Deck on 10/12/21.Pt reports having a lot of pain when walking, performing stairs.    11/23/2021:Pt is a 61 y/o F who has been regularly presenting to PT for treatment of primary OA of L knee, pt reports continued difficulty withpain in her L knee, she thinks she needs a lot more PT. Pt reports pain since Friday around her knee.    12/28/2021: Pt is a 61 y/o F who has been regularly presenting to PT for treatment of primary OA of L knee, pt reports continued difficulty with pain in her L knee.  Pt reports no pain in her knee, only a small pain, would like to continue therapy.  Pt reports traveling and had some pain after traveling.    02/01/2022: Pt is a 61 y/o F who has been regularly presenting to PT for treatment of OA of L knee, s/p L TKR on 10/12/21 by Dr. Madelaine Etienne.  Pt reports continued difficulty with bending her L knee and squatting down.    Onset Date:10/12/21.  Onset Code:  ICD-10 Code:Primary osteoarthritis of left knee [M17.12]      Imaging:XR Knee Left 1 or 2 views  Status: Final result     PACS Images    Show images for XR Knee Left 1 or 2 views  Study Result  Order #: [id number]   Accession #: [id number]  Narrative  Exam: Left knee, 1 portable view     INDICATION: 61 years-old Female presents s/p left total knee arthroplasty     COMPARISON: 03/03/2021     FINDINGS:     Bones and Joints: There has been interval left total knee arthroplasty. There are no radiographic findings of hardware complications on this AP portable view. There is no acute fracture or dislocation.   Alignment appears unremarkable.      Soft Tissues: Postoperative changes including gas.      IMPRESSION:     Unremarkable left TKA.         Reviewed and Electronically Signed by: Ralene Bathe MD   Signed Date/Time: 10-13-2021 06:22:17        Signing Physician Signing Date/Time   Ralene Bathe, MD 10/13/2021 6:22 AM         SUBJECTIVE    Prior Level of Function:I    Pain Level:9/10  11/23/2021:5/10.  12/28/2021: 3/10.  02/01/2022: 4/10.    Dominant Extremity:     IADL'S/Work:IMPAIRED:Pt unable to ambulate, perform stairs, lay supine without L knee pain.    Dressing/Grooming:WNL    Driving/sitting tolerance:WNL    Sleeping:WNL    Aggravating Factors:Laying supine, stairs, walking.    Alleviating Factors:Nothing of note.    Past Medical History:see medical record.    Medications: (Rx Comments, concerns): For a list of current medications review the Medication activity.   Mental Status/Communication:OTHER:Depression, ETOH abuse.    Learns Best:practice, demonstration.      Objective:    02/01/2022:    02/01/22 1800   Language Information   Language of Care Portuguese   Other: yes   Interpreter Yes   Name/ID [facility]   Evaluation Type  Evaluation Type Re-Certification   Rehab Discipline   Rehab Discipline PT   Visit   Visit number 58   POC Due date 03/02/22   Pain   Pain Score 4    Precautions   Precautions Yes   LE Precautions   L Total Knee replacement 10/12/21   Date of surgery 10/12/21   Weight Bearing Status   RLE FWB   LLE FWB   LLE Assessment   LLE Assessment X   AROM LLE (degrees)   L Knee Flexion 0-140 120 Degrees   L Knee Extension 0-130 0 Degrees   Strength LLE   L Hip Flexion 5/5   L Hip Extension 5/5   L Hip ABduction 5/5   L Knee Flexion 5/5   L Knee Extension 5/5   L Ankle Dorsiflexion 5/5   Clinical Special Tests   Special Tests No   Coordination   Gross Motor Performance Observation WFL   Functional Mobility   Gait symmetrical with no AD   Assistive devices None   Balance good on LLE   Palpation    Tenderness to Palpation general about LLE   Increased Tissue Density general about LLE   LE Joint Mobility (0-6 scale)   LE Mobility assessment  Yes   Left Knee Patellofemoral   L Patellofemoral Superior 3/6   L Patellofemoral Inferior 3/6   L Patellofemoral Medial 3/6   L Patellofemoral Lateral 3/6   Patient Education   What was taught? Re-cert, HEP (quad set, heel slide, bridge, s/l hip abd)   Method Verbal   Patient comprehension Yes       Gladstone Pih, PT, Lic # [id number]      11/04/1694:    12/28/21 1700   Language Information   Language of Care Portuguese   Interpreter Yes   Name/ID [facility]   Evaluation Type   Evaluation Type Re-Certification   Rehab Discipline   Rehab Discipline PT   Visit   Visit number 64   POC Due date 01/27/22   Pain   Pain Score 4    Precautions   Precautions Yes   LE Precautions   L Total Knee replacement 10/12/21   Date of surgery 10/12/21   Weight Bearing Status   RLE FWB   LLE FWB   LLE Assessment   LLE Assessment X   AROM LLE (degrees)   L Knee Flexion 0-140 110 Degrees   L Knee Extension 0-130 0 Degrees   PROM LLE (degrees)   L Knee Flexion 0-140 115 Degrees   Strength LLE   L Hip Flexion 5-/5   L Hip Extension 5-/5   L Hip ABduction 5-/5   L Knee Flexion 5-/5   L Knee Extension 5-/5   L Ankle Dorsiflexion 5-/5   Clinical Special Tests   Special Tests No   Functional Mobility   Gait symmetrical with no AD   Assistive devices None   Balance Good on L LE   Palpation   Tenderness to Palpation general about LLE   Increased Tissue Density general about LLE   LE Joint Mobility (0-6 scale)   LE Mobility assessment  Yes   Left Knee Patellofemoral   L Patellofemoral Superior 3/6   L Patellofemoral Inferior 3/6   L Patellofemoral Medial 3/6   L Patellofemoral Lateral 3/6   Patient Education   What was taught? Re-cert, HEP (quad set, heel slide, bridge, s/l hip abd)   Method Verbal   Patient comprehension  Yes       Gladstone Pih, PT, Lic # [id number]      5/95/6387:   11/23/21 1100    Language Information   Language of Care Portuguese   Interpreter Yes   Name/ID [facility] [phone]   Evaluation Type   Evaluation Type Re-Certification   Rehab Discipline   Rehab Discipline PT   Visit   Visit number 8   POC Due date 12/23/21   Pain   Pain Score 5    Precautions   Precautions Yes   LE Precautions   L Total Knee replacement 10/12/21   Date of surgery 10/12/21   Weight Bearing Status   LLE FWB   LLE Assessment   LLE Assessment X   AROM LLE (degrees)   L Knee Flexion 0-140 110 Degrees   L Knee Extension 0-130 0 Degrees   PROM LLE (degrees)   L Knee Flexion 0-140 115 Degrees   Strength LLE   L Hip Flexion 4+/5   L Hip Extension 4+/5   L Hip ABduction 4+/5   L Knee Flexion 4+/5   L Knee Extension 4+/5   L Ankle Dorsiflexion 4+/5   Clinical Special Tests   Special Tests No   Sensation   Light Touch No apparent deficits   Coordination   Gross Motor Performance Observation Stiff;Weak   Functional Mobility   Gait symmetrical with no AD   Assistive devices None   Balance poor on L LE   Palpation   Tenderness to Palpation general about LLE   Increased Tissue Density general about LLE   LE Joint Mobility (0-6 scale)   LE Mobility assessment  Yes   Left Knee Patellofemoral   L Patellofemoral Superior 3/6   L Patellofemoral Inferior 3/6   L Patellofemoral Medial 3/6   L Patellofemoral Lateral 3/6   Patient Education   What was taught? Re-cert, HEP (quad set, heel slide, bridge, s/l hip abd)   Method Verbal   Patient comprehension Yes     Gladstone Pih, PT, Lic # [id number]       41/66/06 1000   Language Information   Language of Care Portuguese   Interpreter Yes   Name/ID Grandaughter   Evaluation Type   Evaluation Type Initial Evaluation   Rehab Discipline   Rehab Discipline PT   Visit   Visit number 1   POC Due date 11/18/21   Pain   Pain Score 9    Precautions   Precautions Yes   LE Precautions   L Total Knee replacement 10/12/21   Date of surgery 10/12/21   Weight Bearing Status   LLE FWB   LLE Assessment    LLE Assessment X   AROM LLE (degrees)   L Knee Flexion 0-140 80 Degrees   L Knee Extension 0-130 0 Degrees   PROM LLE (degrees)   L Knee Flexion 0-140 90 Degrees   Strength LLE   L Hip Flexion 4/5   L Hip Extension 4/5   L Hip ABduction 4/5   L Knee Flexion 4/5   L Knee Extension 4/5   L Ankle Dorsiflexion 4/5   Clinical Special Tests   Special Tests No   Sensation   Light Touch No apparent deficits   Coordination   Gross Motor Performance Observation Stiff;Weak   Functional Mobility   Gait Antalgic L LE with RW   Assistive devices Rolling walker   Balance fair on L LE   Palpation  Tenderness to Palpation general about LLE   Increased Tissue Density general about LLE   LE Joint Mobility (0-6 scale)   LE Mobility assessment  Yes   Left Knee Patellofemoral   L Patellofemoral Superior 3/6   L Patellofemoral Inferior 3/6   L Patellofemoral Medial 3/6   L Patellofemoral Lateral 3/6   Patient Education   What was taught? role of PT, plan of care, HEP (quad set, heel slide, bridge, s/l hip abd)   Method Verbal   Patient comprehension Yes     Gladstone Pih, PT, Lic # [id number]    FUNCTIONAL DEFICITS:Pt unable to ambulate, perform stairs, lay supine without L knee pain.  FUNCTIONAL OUTCOME MEASURES:       Physical Therapy Plan of Care    UE:[id number] Rico Junker, MD  Referring Provider:  Denice Paradise, PA-C     [place], [place] [id number]     Diagnosis:Primary osteoarthritis of left knee [M17.12]    02/01/2022: Assessment/Objective Findings:Patient is a6 year oldfemalewithcontinuedcomplaints of pain in her left knee following TKR. The following PT problems were noted uponRe-evaluation:Improved but stilldecreased L knee flexion AROM,symmetrical gait,good SLS LLE, TTP and diffuse joint swelling of L knee. These problemscontinue tolimit the patient with the following functional activities:Pt unable to ambulate, perform stairs, lay supine without L knee pain.  This patient willcontinue tobenefit from skilled PT plan of care. The prescribed treatment plan of care is medically necessary secondary toPrimary osteoarthritis of left knee [M17.12].    12/28/2021: Assessment/Objective Findings:Patient is a15 year oldfemalewithcontinuedcomplaints of pain in her left knee following TKR. The following PT problems were noted uponRe-evaluation:Improved but stilldecreased strength of L knee flexion and extension, decreased L knee flexion AROM,symmetrical gait,good SLS LLE, TTP and diffuse joint swelling of L knee. These problemscontinue tolimit the patient with the following functional activities:Pt unable to ambulate, perform stairs, lay supine without L knee pain. This patient willcontinue tobenefit from skilled PT plan of care. The prescribed treatment plan of care is medically necessary secondary toPrimary osteoarthritis of left knee [M17.12].    11/23/2021:Assessment/Objective Findings:Patient is a89 year oldfemalewithcontinuedcomplaints of pain in her left knee following TKR. The following PT problems were noted uponRe-evaluation:Improved but stilldecreased strength of L knee flexion and extension, decreased L knee flexion AROM,symmetrical gait,poor SLS LLE, TTP and diffuse joint swelling of L knee. These problemscontinue tolimit the patient with the following functional activities:Pt unable to ambulate, perform stairs, lay supine without L knee pain. This patient willcontinue tobenefit from skilled PT plan of care. The prescribed treatment plan of care is medically necessary secondary toPrimary osteoarthritis of left knee [M17.12].    Assessment/Objective Findings:Patient is a20 year oldfemalewith complaints of pain in her left knee following TKR. The following PT problems were noted upon evaluation: decreased strength of L knee flexion and extension, decreased L knee flexion AROM, antalgic gait with RW LLE, fair SLS LLE, TTP and  diffuse joint swelling of L knee. These problems limit the patient with the following functional activities:Pt unable to ambulate, perform stairs, lay supine without L knee pain. This patient will benefit from skilled PT plan of care. The prescribed treatment plan of care is medically necessary secondary toPrimary osteoarthritis of left knee [M17.12].  Co-morbidities of  Patient Active Problem List:     BMI 33.0-33.9,adult  Pure hypercholesterolemia  Family history of malignant neoplasm of gastrointestinal tract  Tubular adenoma of colon  Hyperopia with astigmatism and presbyopia  Immature cataract  Hypertension, goal  below 140/90  Adenomatous polyp of colon  Squamous 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 eyes  Alcohol abuse  Depression, recurrent (HCC)  Trauma and stressor-related disorder  Nontraumatic complete tear of right rotator cuff  Localized osteoarthritis of left knee  Obesity (BMI 30-39.9)  Chronic pain of left knee  Primary osteoarthritis of left knee  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  In my professional opinion this patient requires skilled physical therapy to address the concerns of the functional limitations outlined above.    Short Term Functional Goals:4 weeks.  Pt will demonstrate independence and compliance with HEP in2weeks3/27/2023:MET  Pt will demonstrate improved ROM ofL knee flexion to 110 degreesin 4weeks3/27/2023:MET  Pt will demonstrate improved SLS ofLLEto 10 seconds LLE on solid surfacein 4weeks3/27/2023:NOT MET 12/28/2021: MET  Pt will demonstrate improved strength ofLLEby 1 MMT Grade in all rangesin 4weeks3/27/2023:MET  Pt will demonstrate improved gaitmechanicsby no antalgic gait LLEin 4weeks3/27/2023:MET  Pt will demonstrate improvedtoleranceof laying supineby 4/10 painin 4weeks3/27/2023:NOT MET  12/28/2021: MET    Long Term Goal:8 weeks.  Pt will demonstrateability to perform stairs without LLE pain in 8 weeks. 12/28/2021: NOT MET 02/01/2022: NOT MET  Pt will demonstrateability to lay supine without LLE pain in 8 weeks. 12/28/2021: NOT MET 02/01/2022: MET    Treatment Plan:** PT Eval - Low Complexity (CPT 97161)  ** PT Re-Eval (CPT 97164)  ** Stretching/ROM/Therapeutic Exercise (97110)  ** Home Exercise Program/ Patient Education (CPT [phone])  ** Neuromuscular Re-education (CPT [phone])  ** Manual Therapy / Joint / Soft tissue Mobilization (CPT 97140)  ** Gait Training (CPT [id number])  ** Functional Activities (CPT 97530)    Recommend Physical Therapy be continued1times per weekfor 8weeks.  The rehabilitation potential for this patient isexcellent  Based on comprehensive examination and evaluation, this patient meets criteria forLOWcomplexity due to combined factor(s) of stablepresentation of1-2body segment(s).    Patient Regis Bill is aware of attendance policy:Yes  Plan of care discussed with Patient/Family:Yes  Patient goals reviewed and incorporated in plan of care:Yes  Patient/Family agrees with plan of care:Yes  Patient/Family education:Yes  Does patient feel safe at home:Yes      Gladstone Pih, PT, DPT, HFS #[id number]      Gladstone Pih, PT, Lic # [id number]

## 2022-02-01 NOTE — Progress Notes (Signed)
S: Pt reports having some pain today, 4/10, doing well.  O: Refer to Rehabilitation Treatment Flowsheet     02/01/22 1800   Language Information   Language of Care Portuguese   Other: yes   Interpreter Yes   Name/ID [facility]   Precautions   Precautions Yes   LE Precautions   L Total Knee replacement 10/12/21   Date of surgery 10/12/21   Weight Bearing Status   RLE FWB   LLE FWB   Rehab Discipline   Rehab Discipline PT   Visit   Visit number 18   POC Due date 03/02/22   Time Calculation   Start Time 1800   Stop Time 1830   Time Calculation (min) 30 min   Pain   Pain Score 4    Ther Exercise   Exercise Bridge   Holds 30   Ther Exercise 2   Exercise SLR   Holds 2 30   Ther Exercise 3   Exercise 3 S/L Hip Abd   Holds 3 2#x30   Ther Exercise 4   Exercise 4 Sit-stand   Holds 4 20# KB x 30   Patient Education   What was taught? Re-cert, HEP (quad set, heel slide, bridge, s/l hip abd)   Method Verbal   Patient comprehension Yes       Gladstone Pih, PT, Lic # [id number]  A: PT conducted recert, pt in need of continued skilled PT.  Pt tol therex without pain, she has progressed with steadily lowering pain scores.  P: continue LE strengthening.Gladstone Pih, PT, Lic # [id number]

## 2022-02-02 ENCOUNTER — Other Ambulatory Visit (HOSPITAL_BASED_OUTPATIENT_CLINIC_OR_DEPARTMENT_OTHER): Payer: Self-pay | Admitting: Family Medicine

## 2022-02-02 NOTE — Telephone Encounter (Signed)
[facility], Stephanie [name] is a 61 year old female has requested a refill of duloxetine.    Rx was discontinued 11/20/2021, please review.      Last Office Visit: 11/30/2021 with PCP  Last Physical Exam: 02/21/2013    There are no preventive care reminders to display for this patient.    Other Med Adult:  Most Recent BP Reading(s)  11/30/21 : 120/80        Cholesterol (mg/dL)   Date Value   11/30/2021 237     LOW DENSITY LIPOPROTEIN DIRECT (mg/dL)   Date Value   11/30/2021 135     HIGH DENSITY LIPOPROTEIN (mg/dL)   Date Value   11/30/2021 49     TRIGLYCERIDES (mg/dL)   Date Value   11/30/2021 254 (H)         THYROID SCREEN TSH REFLEX FT4 (uIU/mL)   Date Value   08/03/2021 1.460         TSH (THYROID STIM HORMONE) (uIU/mL)   Date Value   12/10/2016 0.790       HEMOGLOBIN A1C (%)   Date Value   04/17/2020 6.2 (H)       No results found for: POCA1C      INR (no units)   Date Value   08/03/2021 1.1   04/22/2008 1.0 (L)   02/07/2005 1.0 (L)       SODIUM (mmol/L)   Date Value   10/13/2021 137       POTASSIUM (mmol/L)   Date Value   10/13/2021 4.1           CREATININE (mg/dL)   Date Value   10/13/2021 0.6       Documented patient preferred pharmacies:    [facility], [place]  Phone: [phone] Fax: [phone]

## 2022-02-03 MED FILL — DULOXETINE 60MG: 30 days supply | Qty: 30 | Fill #0

## 2022-02-03 NOTE — Progress Notes (Signed)
I certify that the documented Treatment Plan is reasonable and necessary.    02/03/2022  Lemar Livings, MD

## 2022-02-08 ENCOUNTER — Ambulatory Visit (HOSPITAL_BASED_OUTPATIENT_CLINIC_OR_DEPARTMENT_OTHER): Payer: No Typology Code available for payment source | Admitting: Rehabilitative and Restorative Service Providers"

## 2022-02-08 ENCOUNTER — Other Ambulatory Visit: Payer: Self-pay

## 2022-02-08 DIAGNOSIS — Z96652 Presence of left artificial knee joint: Secondary | ICD-10-CM | POA: Diagnosis present

## 2022-02-08 DIAGNOSIS — M1712 Unilateral primary osteoarthritis, left knee: Secondary | ICD-10-CM

## 2022-02-08 DIAGNOSIS — M25562 Pain in left knee: Secondary | ICD-10-CM | POA: Diagnosis present

## 2022-02-08 DIAGNOSIS — G8929 Other chronic pain: Secondary | ICD-10-CM | POA: Diagnosis present

## 2022-02-08 NOTE — Progress Notes (Signed)
S: Pt reports she's a little nauseous today, no pain today, 0/10.  O: Refer to Rehabilitation Treatment Flowsheet     02/08/22 1600   Language Information   Language of Care Portuguese   Interpreter Yes   Name/ID [place]   Precautions   Precautions Yes   LE Precautions   L Total Knee replacement 10/12/21   Date of surgery 10/12/21   Weight Bearing Status   RLE FWB   LLE FWB   Rehab Discipline   Rehab Discipline PT   Visit   Visit number 19   POC Due date 03/02/22   Time Calculation   Start Time 1630   Stop Time 1700   Time Calculation (min) 30 min   Pain   Pain Score 0    Ther Exercise   Exercise Bridge   Holds 30   Ther Exercise 2   Exercise SLR   Holds 2 30   Ther Exercise 3   Exercise 3 S/L Hip Abd   Holds 3 30 each   Patient Education   What was taught? cont HEP   Method Verbal   Patient comprehension Yes       Gladstone Pih, PT, Lic # [id number]  A: Pt elected to continue with session despite PT offering to defer due to nausea.  Pt tol therex well without pain, she is progressing overall.  Pt slow moving due to nausea symptoms.  Pt in need of continued skilled PT.  P: cont LE strengthening.    Gladstone Pih, PT, Lic # [id number]

## 2022-02-11 MED FILL — OMEPRAZOLE 40MG: 90 days supply | Qty: 90 | Fill #5

## 2022-02-11 MED FILL — LOSARTAN POT 25MG: 90 days supply | Qty: 90 | Fill #0

## 2022-02-12 ENCOUNTER — Ambulatory Visit (HOSPITAL_BASED_OUTPATIENT_CLINIC_OR_DEPARTMENT_OTHER): Payer: No Typology Code available for payment source | Admitting: Rehabilitative and Restorative Service Providers"

## 2022-02-12 ENCOUNTER — Other Ambulatory Visit: Payer: Self-pay

## 2022-02-12 DIAGNOSIS — M1712 Unilateral primary osteoarthritis, left knee: Secondary | ICD-10-CM

## 2022-02-12 DIAGNOSIS — G8929 Other chronic pain: Secondary | ICD-10-CM | POA: Diagnosis present

## 2022-02-12 DIAGNOSIS — Z96652 Presence of left artificial knee joint: Secondary | ICD-10-CM | POA: Diagnosis present

## 2022-02-12 DIAGNOSIS — M25562 Pain in left knee: Secondary | ICD-10-CM | POA: Diagnosis present

## 2022-02-12 NOTE — Progress Notes (Signed)
S:  Patient reports L knee pain today 0/10; she does have some pain on R knee.  She is compliant w/ HEP    O:  Refer to rehab tx flow sheet    A:  VC to prevent lumbar ext w/ bridge; good eccentric control and abd iso holds using GTB.  Intermittent TC to maintain s/l position for hip abd.  Noted glute ms fatigue; no knee discomfort.      P:  Resisted lateral stepping NV.         02/12/22 1100   Language Information   Language of Care Portuguese   Interpreter Yes   Name/ID [place]   Precautions   Precautions Yes   LE Precautions   L Total Knee replacement 10/12/21   Date of surgery 10/12/21   Weight Bearing Status   RLE FWB   LLE FWB   Rehab Discipline   Rehab Discipline PT   Visit   Visit number 20   POC Due date 03/02/22   Time Calculation   Start Time 1130   Stop Time 1202   Time Calculation (min) 32 min   Pain   Pain Score 0    Ther Exercise   Therapeutic Exercise? Yes   Exercise Bridge GTB   Holds 30   Ther Exercise 2   Exercise SLR 1#   Reps 2 10   Sets 2 3   Ther Exercise 3   Exercise 3 S/L Hip Abd 1#   Reps 3 10   Sets 3 3   Patient Education   What was taught? HEP add GTB bridge YTB hip abd   Method Verbal;Demo;Practice   Patient comprehension Yes       Lanae Crumbly, PTA, Lic # [id number]

## 2022-02-15 ENCOUNTER — Other Ambulatory Visit: Payer: Self-pay

## 2022-02-15 ENCOUNTER — Ambulatory Visit
Payer: No Typology Code available for payment source | Attending: Rehabilitative and Restorative Service Providers" | Admitting: Rehabilitative and Restorative Service Providers"

## 2022-02-15 DIAGNOSIS — Z96652 Presence of left artificial knee joint: Secondary | ICD-10-CM | POA: Diagnosis present

## 2022-02-15 DIAGNOSIS — M1712 Unilateral primary osteoarthritis, left knee: Secondary | ICD-10-CM | POA: Diagnosis not present

## 2022-02-15 DIAGNOSIS — G8929 Other chronic pain: Secondary | ICD-10-CM | POA: Diagnosis present

## 2022-02-15 DIAGNOSIS — M25562 Pain in left knee: Secondary | ICD-10-CM | POA: Diagnosis present

## 2022-02-15 NOTE — Progress Notes (Signed)
S:  Patient reports 0/10 L knee pain but does have R knee pain today.      O:  Refer to rehab tx flow sheet    A:  Good demonstration of resisted lateral stepping w/ good postural control; TC to promote loaded hip hinge.      P:  Cont w/ functional strengthening.        02/15/22 1500   Language Information   Language of Care Portuguese   Interpreter Yes   Name/ID [place]   Precautions   Precautions Yes   LE Precautions   L Total Knee replacement 10/12/21   Date of surgery 10/12/21   Weight Bearing Status   RLE FWB   LLE FWB   Rehab Discipline   Rehab Discipline PT   Visit   Visit number 21   POC Due date 03/02/22   Time Calculation   Start Time 1535   Stop Time 1600   Time Calculation (min) 25 min   Pain   Pain Score 0    Ther Exercise   Therapeutic Exercise? Yes   Ther Exercise 2   Exercise SLR 1#   Reps 2 10   Sets 2 3   Ther Exercise 3   Exercise 3 S/L Hip Abd 1#   Reps 3 10   Sets 3 3   Ther Exercise 4   Exercise 4 Sit-stand GTB   Reps 4 10   Sets 4 3   Holds 4 20# KB x 30 @ mirror   Ther Exercise 6   Exercise 6 lateral stepping GTB   Holds 6 20' x 4 laps   Patient Education   What was taught? HEP lateral stepping   Method Verbal;Demo;Practice   Patient comprehension Yes       Lanae Crumbly, PTA, Lic # [id number]

## 2022-02-22 ENCOUNTER — Ambulatory Visit (HOSPITAL_BASED_OUTPATIENT_CLINIC_OR_DEPARTMENT_OTHER): Payer: No Typology Code available for payment source | Admitting: Rehabilitative and Restorative Service Providers"

## 2022-02-25 MED FILL — TRAZODONE 50MG: 90 days supply | Qty: 90 | Fill #1

## 2022-03-01 ENCOUNTER — Ambulatory Visit (HOSPITAL_BASED_OUTPATIENT_CLINIC_OR_DEPARTMENT_OTHER): Payer: No Typology Code available for payment source | Admitting: Rehabilitative and Restorative Service Providers"

## 2022-03-01 ENCOUNTER — Ambulatory Visit (HOSPITAL_BASED_OUTPATIENT_CLINIC_OR_DEPARTMENT_OTHER): Payer: Self-pay | Admitting: Registered Nurse

## 2022-03-01 NOTE — Telephone Encounter (Signed)
In coming   Stephanie [name]      DOB and full name verified.      Reason for Disposition  . Patient wants to be seen  . MILD swelling of both ankles (i.e., pedal edema) AND new onset or worsening    Answer Assessment - Initial Assessment Questions  Has been very tired and legs have been swollen    This is not for the knee pain from the knee replacement  Getting very tired and legs getting swollen  Started one month ago in June 2023  Both legs feet ankles and legs  Goes up to each knee  Same size each side  Put shoes on then the swelling gets worse  Can walk  No redness warth to the touch  Pain level is only in the right knee , has to get surgery in this in the future  Has severe pain inside the knee8/10 pain  Had a knee replacement in left 09/2021  Denies chest pain, fever difficylty breathing or pain in any of the calfs  Scheduled appointment with PCP 03/03/22  Advised any change in symptoms call back to discuss.  Pt was advised in the event of a medical emergency, to call 911 and or go to the ED.  Patient verbalized understanding to all.    Answer Assessment - Initial Assessment Questions  States has difficulty breathing ," all the time I always feel SOB"  SOB started 2 weeks ago feels very tired and weak  Walking/movement makes it worse  Even when laying down gets SOB at times has to take deep breaths in order to be able to breath, feeling tired because  I'm talking, states not SOB just tired  Denies lightheadedness dizziness, chest pain   Had a sharp chest pain 2 weeks ago, now feels very tired overall  2 days with chest pain 2 weeks ago, located just above the chest on the right side, they were "painful" took ibuprofen  And tylenol and didn't have it again(took one of each after 2 days it disapperaed)  Denies cardiac or lung disease  Patient asked for an appointment Friday 03/05/22, Told patient none available. She agreed to take 03/03/22 happens to be with PCP.  Advised any change in  symptoms call back to discuss.  Pt was advised in the event of a medical emergency, to call 911 and or go to the ED.  Patient verbalized understanding to all.    Protocols used: ADULT BREATHING DIFFICULTY-A-OH, ADULT LEG SWELLING AND EDEMA-A-OH  Priority: Urgent    Advised per nursing triage protocol.  Verbalized understanding and agreement with instructions and disposition.     Recommended disposition for patient:Disposition: See in Office within 3 days    Instructed patient to call back for any new, worsening, or worrisome symptoms or concerns any time day or night.  [name], RN, 03/01/2022

## 2022-03-01 NOTE — Telephone Encounter (Signed)
-----   Message from Rhea Belton, [place] sent at 03/01/2022 11:38 AM EDT -----  Stephanie [name] [id number], 61 year old, female    Calls today:  Sick    What are the symptoms: Fatigue, Leg swelling in both legs   How long has patient been sick? 1 month   What has pt. tried at home: N/A      Person calling on behalf of patient: Patient (self)    CALL BACK NUMBER: [phone]     Patient's language of care: [place] ([place]    Patient needs a [place] interpreter.    Patient's PCP: Orie Fisherman, MD    Primary Care Home Site:  [place]

## 2022-03-03 ENCOUNTER — Encounter (HOSPITAL_BASED_OUTPATIENT_CLINIC_OR_DEPARTMENT_OTHER): Payer: Self-pay | Admitting: Family Medicine

## 2022-03-03 ENCOUNTER — Other Ambulatory Visit: Payer: Self-pay

## 2022-03-03 ENCOUNTER — Ambulatory Visit: Payer: No Typology Code available for payment source | Attending: Family Medicine | Admitting: Family Medicine

## 2022-03-03 VITALS — BP 134/81 | HR 96 | Temp 97.1°F | Ht 59.0 in | Wt 168.0 lb

## 2022-03-03 DIAGNOSIS — Z6833 Body mass index (BMI) 33.0-33.9, adult: Secondary | ICD-10-CM | POA: Insufficient documentation

## 2022-03-03 DIAGNOSIS — M25561 Pain in right knee: Secondary | ICD-10-CM | POA: Insufficient documentation

## 2022-03-03 DIAGNOSIS — L299 Pruritus, unspecified: Secondary | ICD-10-CM | POA: Insufficient documentation

## 2022-03-03 DIAGNOSIS — R079 Chest pain, unspecified: Secondary | ICD-10-CM | POA: Diagnosis present

## 2022-03-03 DIAGNOSIS — G8929 Other chronic pain: Secondary | ICD-10-CM | POA: Insufficient documentation

## 2022-03-03 DIAGNOSIS — R609 Edema, unspecified: Secondary | ICD-10-CM | POA: Insufficient documentation

## 2022-03-03 MED ORDER — TRIAMCINOLONE ACETONIDE 0.025 % EX CREA
TOPICAL_CREAM | Freq: Two times a day (BID) | CUTANEOUS | 0 refills | Status: AC
Start: 2022-03-03 — End: 2022-03-10

## 2022-03-03 MED FILL — TRIAMCINOLON CRE 0.025%: 7 days supply | Qty: 30 | Fill #0

## 2022-03-03 NOTE — Progress Notes (Signed)
BP 134/81   Pulse 96   Temp 97.1 F (36.2 C) (Temporal)   Ht '4\' 11"'$  (1.499 m)   Wt 76.2 kg (168 lb)   LMP 07/11/2005   SpO2 98%   BMI 33.93 kg/m     S:    Stephanie [name] is a 61 year old female who presents with:    Most Recent Weight Reading(s)  03/03/22 : 76.2 kg (168 lb)  11/30/21 : 76.2 kg (168 lb)  11/19/21 : 73.9 kg (163 lb)  10/12/21 : 74.8 kg (165 lb)  10/01/21 : 73 kg (161 lb)    BRIEF DIET QUESTIONNAIRE:     Average Day (e.g. Yesterday):     Breakfast: skips, coffee, with a cookie  Lunch: rice beans meat salad veggies beets   Dinner: barely eats eggs and rice   Snacks: banana or orange no  Drinks: water    Where eating meals (e.g. Table, car, computer/TV): table    How much time to eat a meal?: fast    How often eats a meal out?: no    Do you cook, if so what kinds of foods?: yes    Considers diet healthy?: wrong - fast, rice pasta           The 10-year ASCVD risk score (Arnett DK, et al., 2019) is: 6.5%    Values used to calculate the score:      Age: 4 years      Sex: Female      Is Non-Hispanic African American: No      Diabetic: No      Tobacco smoker: No      Systolic Blood Pressure: 161 mmHg      Is BP treated: Yes      HDL Cholesterol: 49 mg/dL      Total Cholesterol: 237 mg/dL      Past Medical History:  02/12/2016: Chronic right shoulder pain  No date: Depression  03/20/2012: Diverticulosis of colon      Comment:  Noted on colonoscopy 6/13   08/16/2010: Elevated hemoglobin A1c      Comment:  HEMOGLOBIN A1C (%) Date Value 03/06/2015 5.6                05/20/2014 5.4 05/08/2012 6.0 (H)   No results found for:               POCA1C   11/21/2012: Hyperopia with astigmatism and presbyopia  No date: Hypertension  11/21/2012: Immature cataract  01/21/2012: Impingement syndrome of right shoulder      Comment:  10/14: pt taking  [place] med (meloxicam 7.5 mg BID                and cyclobenzaprine); Will ask for refill once supply                finished; PCP ok with refill of flexeril on temporary                 basis;   01/26/2008: Irritable bladder      Comment:  Seen by Dr Jeraldine Loots 5/09, no incontinence, likely                irritable bladder.  Trial of anticholinergic, refer to                urology, has appt Dr Minna Antis 02/23/08  02/16/2005: Leiomyoma of uterus, unspecified      Comment:  Dr Jeraldine Loots hysterectomy November, 2006; cervix retained,  last pap 11/11 neg Repeat pap/HPV 07/2015   02/15/2014: Mass of hand  07/30/2011: Other plastic surgery for unacceptable cosmetic appearance      Comment:  Breast augmentation scheduled 08/02/11;  Dr.  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    ROS: Per HPI. No reported headaches, weakness/falls, chest pain, shortness of breath, abdominal pain/nausea/vomiting, numbness/tingling, fevers or chills.     Medications, allergies, PMH and SH reviewed and updated as necessary in Epic.    O:  PHYSICAL EXAM:  BP 134/81   Pulse 96   Temp 97.1 F (36.2 C) (Temporal)   Ht '4\' 11"'$  (1.499 m)   Wt 76.2 kg (168 lb)   LMP 07/11/2005   SpO2 98%   BMI 33.93 kg/m   GEN: NAD  HEENT: MMM  SKIN: WWP, lesion temple l side no erythema  CHEST: Breathing comfortably  NEURO: No focal deficits noted, alert  MSK: Normal gait observed  PSYCH: Appears stated age, appropriate grooming and dress, appropriate affect and mood congruent    A/P:  Stephanie [name] is a 61 year old female presents with:    1. Itching  - triamcinolone (KENALOG) 0.025 % cream; Apply topically 2 (two) times daily  for 7 days  Dispense: 30 g; Refill: 0    2. Swelling  None on exam today   Reviewed keeping active     3. Chest pain, unspecified  type  Non exertional R sided no red flags   Normal testing recently   Per guidelines could be on statin - wasn't started by cards who did testing   With some muscle pain will hold off for now and check in with cards    4. Chronic pain of right knee  Reviewed getting surgery     5. BMI 33.0-33.9,adult  Reviewed phentermine as victoza isn't helping         We discussed the patient's current medications. The patient expressed understanding and no barriers to adherence were identified.    F/u as needed    Health Maintenance:  Reviewed HM    Lemar Livings, MD

## 2022-03-08 ENCOUNTER — Other Ambulatory Visit: Payer: Self-pay

## 2022-03-08 ENCOUNTER — Ambulatory Visit
Payer: No Typology Code available for payment source | Attending: Rehabilitative and Restorative Service Providers" | Admitting: Rehabilitative and Restorative Service Providers"

## 2022-03-08 DIAGNOSIS — M1712 Unilateral primary osteoarthritis, left knee: Secondary | ICD-10-CM | POA: Diagnosis present

## 2022-03-08 DIAGNOSIS — G8929 Other chronic pain: Secondary | ICD-10-CM | POA: Insufficient documentation

## 2022-03-08 DIAGNOSIS — M25562 Pain in left knee: Secondary | ICD-10-CM | POA: Diagnosis present

## 2022-03-08 DIAGNOSIS — Z96652 Presence of left artificial knee joint: Secondary | ICD-10-CM | POA: Diagnosis present

## 2022-03-08 NOTE — Progress Notes (Signed)
S: Pt reports having some pain when doing the stairs and walking, however no pain today, 0/10.  O: Refer to Rehabilitation Treatment Flowsheet     03/08/22 1600   Language Information   Language of Care Portuguese   Interpreter Yes   Name/ID [place]   Precautions   Precautions Yes   LE Precautions   L Total Knee replacement 10/12/21   Date of surgery 10/12/21   Weight Bearing Status   RLE FWB   LLE FWB   Rehab Discipline   Rehab Discipline PT   Visit   Visit number 22   POC Due date 03/02/22   Time Calculation   Start Time 1437  (pt tardy)   Stop Time 1500   Time Calculation (min) 23 min   Pain   Pain Score 0    Ther Exercise   Exercise Bridge   Holds 30   Ther Exercise 2   Exercise S/L Hip Abd   Holds 2 30   Ther Exercise 3   Exercise 3 TKE Cable Column   Holds 3 16#s x 30   Ther Exercise 4   Exercise 4 Sit-stand   Holds 4 15#s x 30   Ther Exercise 6   Exercise 6 lateral stepping GTB   Holds 6 20' x 4 laps   Patient Education   What was taught? Re-cert, HEP (quad set, heel slide, bridge, s/l hip abd)   Method Verbal   Patient comprehension Yes       Gladstone Pih, PT, Lic # [id number]  A: PT conducted recert, pt in need of continued skilled PT.  Pt tol therex without pain increases.  Pt in need of continued skilled PT.  P: cont L knee strengthening.    Gladstone Pih, PT, Lic # [id number]

## 2022-03-08 NOTE — Progress Notes (Signed)
OUTPATIENTRE-CERTIFICATION    Referring Provider:  Denice Paradise, PA-C     [place], [place] [address]       Precautions:L TKR 10/12/21, HTN.    SUBJECTIVE  Hx of Present Illness: Pt is a71 year oldfemalewho presents to physical therapy with a physician diagnosis of Primary osteoarthritis of left knee [M17.12]s/p L TKR by Dr. Claris Gladden Deck on 10/12/21.Pt reports having a lot of pain when walking, performing stairs.    11/23/2021:Pt is a 61 y/o F who has been regularly presenting to PT for treatment of primary OA of L knee, pt reports continued difficulty withpain in her L knee, she thinks she needs a lot more PT. Pt reports pain since Friday around her knee.    12/28/2021:Pt is a 61 y/o F who has been regularly presenting to PT for treatment of primary OA of L knee, pt reports continued difficulty with pain in her L knee.Pt reports no pain in her knee, only a small pain, would like to continue therapy. Pt reports traveling and had some pain after traveling.    02/01/2022: Pt is a 61 y/o F who has been regularly presenting to PT for treatment of OA of L knee, s/p L TKR on 10/12/21 by Dr. Madelaine Etienne.  Pt reports continued difficulty with bending her L knee and squatting down.    03/08/2022: Pt is a 61 y/o F who has been regularly presenting to PT for treatment of OA of L knee, s/p L TKR on 10/12/21.  Pt reports continued difficulty with pain in her L knee when walking and using stairs.    Onset Date:10/12/21.  Onset Code:  ICD-10 Code:Primary osteoarthritis of left knee [M17.12]      Imaging:XR Knee Left 1 or 2 views  Status: Final result     PACS Images    Show images for XR Knee Left 1 or 2 views  Study Result  Order #: [id number]   Accession #: [id number]  Narrative  Exam: Left knee, 1 portable view     INDICATION: 61 years-old Female presents s/p left total knee arthroplasty     COMPARISON: 03/03/2021     FINDINGS:     Bones and Joints: There  has been interval left total knee arthroplasty. There are no radiographic findings of hardware complications on this AP portable view. There is no acute fracture or dislocation.   Alignment appears unremarkable.     Soft Tissues: Postoperative changes including gas.      IMPRESSION:     Unremarkable left TKA.         Reviewed and Electronically Signed by: Ralene Bathe MD   Signed Date/Time: 10-13-2021 06:22:17        Signing Physician Signing Date/Time   Ralene Bathe, MD 10/13/2021 6:22 AM         SUBJECTIVE    Prior Level of Function:I    Pain Level:9/10  11/23/2021:5/10.  12/28/2021:3/10.  02/01/2022: 4/10.  03/08/2022: 0/10.    Dominant Extremity:     IADL'S/Work:IMPAIRED:Pt unable to ambulate, perform stairs, lay supine without L knee pain.    Dressing/Grooming:WNL    Driving/sitting tolerance:WNL    Sleeping:WNL    Aggravating Factors:Laying supine, stairs, walking.    Alleviating Factors:Nothing of note.    Past Medical History:see medical record.    Medications: (Rx Comments, concerns): For a list of current medications review the Medication activity.   Mental Status/Communication:OTHER:Depression, ETOH abuse.    Learns Best:practice, demonstration.  Objective:    03/08/2022:    03/08/22 1600   Language Information   Language of Care Portuguese   Interpreter Yes   Name/ID [facility]   Evaluation Type   Evaluation Type Re-Certification   Rehab Discipline   Rehab Discipline PT   Visit   Visit number 22   POC Due date 03/02/22   Pain   Pain Score 0    Precautions   Precautions Yes   LE Precautions   L Total Knee replacement 10/12/21   Date of surgery 10/12/21   Weight Bearing Status   RLE FWB   LLE FWB   LLE Assessment   LLE Assessment X   AROM LLE (degrees)   L Knee Flexion 0-140 125 Degrees   L Knee Extension 0-130 0 Degrees   Strength LLE   L Hip Flexion 5/5   L Hip Extension 5/5   L Hip ABduction 5/5   L Knee Flexion 5/5   L Knee Extension 5/5   L Ankle Dorsiflexion 5/5    Clinical Special Tests   Special Tests No   Sensation   Light Touch No apparent deficits   Functional Mobility   Gait symmetrical with no AD   Assistive devices None   Balance good on LLE   Palpation   Tenderness to Palpation general about LLE   Increased Tissue Density general about LLE   LE Joint Mobility (0-6 scale)   LE Mobility assessment  Yes   Left Knee Patellofemoral   L Patellofemoral Superior 3/6   L Patellofemoral Inferior 3/6   L Patellofemoral Medial 3/6   L Patellofemoral Lateral 3/6   Patient Education   What was taught? Re-cert, HEP (quad set, heel slide, bridge, s/l hip abd)   Method Verbal   Patient comprehension Yes       Gladstone Pih, PT, Lic # [id number]      12/30/8411:    02/01/22 1800   Language Information   Language of Care Portuguese   Other: yes   Interpreter Yes   Name/ID [facility]   Evaluation Type   Evaluation Type Re-Certification   Rehab Discipline   Rehab Discipline PT   Visit   Visit number 18   POC Due date 03/02/22   Pain   Pain Score 4    Precautions   Precautions Yes   LE Precautions   L Total Knee replacement 10/12/21   Date of surgery 10/12/21   Weight Bearing Status   RLE FWB   LLE FWB   LLE Assessment   LLE Assessment X   AROM LLE (degrees)   L Knee Flexion 0-140 120 Degrees   L Knee Extension 0-130 0 Degrees   Strength LLE   L Hip Flexion 5/5   L Hip Extension 5/5   L Hip ABduction 5/5   L Knee Flexion 5/5   L Knee Extension 5/5   L Ankle Dorsiflexion 5/5   Clinical Special Tests   Special Tests No   Coordination   Gross Motor Performance Observation WFL   Functional Mobility   Gait symmetrical with no AD   Assistive devices None   Balance good on LLE   Palpation   Tenderness to Palpation general about LLE   Increased Tissue Density general about LLE   LE Joint Mobility (0-6 scale)   LE Mobility assessment  Yes   Left Knee Patellofemoral   L Patellofemoral Superior 3/6   L Patellofemoral Inferior 3/6   L Patellofemoral Medial  3/6   L Patellofemoral Lateral 3/6   Patient  Education   What was taught? Re-cert, HEP (quad set, heel slide, bridge, s/l hip abd)   Method Verbal   Patient comprehension Yes       Gladstone Pih, PT, Lic # [id number]      01/02/2129:   12/28/21 1700   Language Information   Language of Care Portuguese   Interpreter Yes   Name/ID [facility]   Evaluation Type   Evaluation Type Re-Certification   Rehab Discipline   Rehab Discipline PT   Visit   Visit number 53   POC Due date 01/27/22   Pain   Pain Score 4    Precautions   Precautions Yes   LE Precautions   L Total Knee replacement 10/12/21   Date of surgery 10/12/21   Weight Bearing Status   RLE FWB   LLE FWB   LLE Assessment   LLE Assessment X   AROM LLE (degrees)   L Knee Flexion 0-140 110 Degrees   L Knee Extension 0-130 0 Degrees   PROM LLE (degrees)   L Knee Flexion 0-140 115 Degrees   Strength LLE   L Hip Flexion 5-/5   L Hip Extension 5-/5   L Hip ABduction 5-/5   L Knee Flexion 5-/5   L Knee Extension 5-/5   L Ankle Dorsiflexion 5-/5   Clinical Special Tests   Special Tests No   Functional Mobility   Gait symmetrical with no AD   Assistive devices None   Balance Goodon L LE   Palpation   Tenderness to Palpation general about LLE   Increased Tissue Density general about LLE   LE Joint Mobility (0-6 scale)   LE Mobility assessment  Yes   Left Knee Patellofemoral   L Patellofemoral Superior 3/6   L Patellofemoral Inferior 3/6   L Patellofemoral Medial 3/6   L Patellofemoral Lateral 3/6   Patient Education   What was taught? Re-cert, HEP (quad set, heel slide, bridge, s/l hip abd)   Method Verbal   Patient comprehension Yes       Gladstone Pih, PT, Lic # [id number]      4/69/6295:   11/23/21 1100   Language Information   Language of Care Portuguese   Interpreter Yes   Name/ID [facility] [phone]   Evaluation Type   Evaluation Type Re-Certification   Rehab Discipline   Rehab Discipline PT   Visit   Visit number 8   POC Due date 12/23/21   Pain   Pain Score 5    Precautions   Precautions Yes   LE  Precautions   L Total Knee replacement 10/12/21   Date of surgery 10/12/21   Weight Bearing Status   LLE FWB   LLE Assessment   LLE Assessment X   AROM LLE (degrees)   L Knee Flexion 0-140 110 Degrees   L Knee Extension 0-130 0 Degrees   PROM LLE (degrees)   L Knee Flexion 0-140 115 Degrees   Strength LLE   L Hip Flexion 4+/5   L Hip Extension 4+/5   L Hip ABduction 4+/5   L Knee Flexion 4+/5   L Knee Extension 4+/5   L Ankle Dorsiflexion 4+/5   Clinical Special Tests   Special Tests No   Sensation   Light Touch No apparent deficits   Coordination   Gross Motor Performance Observation Stiff;Weak   Functional Mobility   Gait symmetrical with no  AD   Assistive devices None   Balance poor on L LE   Palpation   Tenderness to Palpation general about LLE   Increased Tissue Density general about LLE   LE Joint Mobility (0-6 scale)   LE Mobility assessment  Yes   Left Knee Patellofemoral   L Patellofemoral Superior 3/6   L Patellofemoral Inferior 3/6   L Patellofemoral Medial 3/6   L Patellofemoral Lateral 3/6   Patient Education   What was taught? Re-cert, HEP (quad set, heel slide, bridge, s/l hip abd)   Method Verbal   Patient comprehension Yes     Gladstone Pih, PT, Lic # [id number]       61/60/73 1000   Language Information   Language of Care Portuguese   Interpreter Yes   Name/ID Grandaughter   Evaluation Type   Evaluation Type Initial Evaluation   Rehab Discipline   Rehab Discipline PT   Visit   Visit number 1   POC Due date 11/18/21   Pain   Pain Score 9    Precautions   Precautions Yes   LE Precautions   L Total Knee replacement 10/12/21   Date of surgery 10/12/21   Weight Bearing Status   LLE FWB   LLE Assessment   LLE Assessment X   AROM LLE (degrees)   L Knee Flexion 0-140 80 Degrees   L Knee Extension 0-130 0 Degrees   PROM LLE (degrees)   L Knee Flexion 0-140 90 Degrees   Strength LLE   L Hip Flexion 4/5   L Hip Extension 4/5   L Hip ABduction 4/5   L Knee Flexion 4/5   L Knee Extension 4/5   L Ankle  Dorsiflexion 4/5   Clinical Special Tests   Special Tests No   Sensation   Light Touch No apparent deficits   Coordination   Gross Motor Performance Observation Stiff;Weak   Functional Mobility   Gait Antalgic L LE with RW   Assistive devices Rolling walker   Balance fair on L LE   Palpation   Tenderness to Palpation general about LLE   Increased Tissue Density general about LLE   LE Joint Mobility (0-6 scale)   LE Mobility assessment  Yes   Left Knee Patellofemoral   L Patellofemoral Superior 3/6   L Patellofemoral Inferior 3/6   L Patellofemoral Medial 3/6   L Patellofemoral Lateral 3/6   Patient Education   What was taught? role of PT, plan of care, HEP (quad set, heel slide, bridge, s/l hip abd)   Method Verbal   Patient comprehension Yes     Gladstone Pih, PT, Lic # [id number]    FUNCTIONAL DEFICITS:Pt unable to ambulate, perform stairs, lay supine without L knee pain.  FUNCTIONAL OUTCOME MEASURES:       Physical Therapy Plan of Care    IR:SWNIOEVOJ Rico Junker, MD  Referring Provider:  Denice Paradise, PA-C     [place],  [address]     Diagnosis:Primary osteoarthritis of left knee [M17.12]    03/08/2022: Assessment/Objective Findings:Patient is a88 year oldfemalewithcontinuedcomplaints of pain in her left knee following TKR. The following PT problems were noted uponRe-evaluation:Improved but stilldecreased L knee flexion AROM,symmetrical gait,goodSLS LLE, TTP and diffuse joint swelling of L knee. These problemscontinue tolimit the patient with the following functional activities:Pt unable to ambulate, perform stairs, lay supine without L knee pain. This patient willcontinue tobenefit from skilled PT plan  of care. The prescribed treatment plan of care is medically necessary secondary toPrimary osteoarthritis of left knee [M17.12].    02/01/2022: Assessment/Objective Findings:Patient is a30 year oldfemalewithcontinuedcomplaints of  pain in her left knee following TKR. The following PT problems were noted uponRe-evaluation:Improved but stilldecreased L knee flexion AROM,symmetrical gait,goodSLS LLE, TTP and diffuse joint swelling of L knee. These problemscontinue tolimit the patient with the following functional activities:Pt unable to ambulate, perform stairs, lay supine without L knee pain. This patient willcontinue tobenefit from skilled PT plan of care. The prescribed treatment plan of care is medically necessary secondary toPrimary osteoarthritis of left knee [M17.12].    12/28/2021:Assessment/Objective Findings:Patient is a77 year oldfemalewithcontinuedcomplaints of pain in her left knee following TKR. The following PT problems were noted uponRe-evaluation:Improved but stilldecreased strength of L knee flexion and extension, decreased L knee flexion AROM,symmetrical gait,goodSLS LLE, TTP and diffuse joint swelling of L knee. These problemscontinue tolimit the patient with the following functional activities:Pt unable to ambulate, perform stairs, lay supine without L knee pain. This patient willcontinue tobenefit from skilled PT plan of care. The prescribed treatment plan of care is medically necessary secondary toPrimary osteoarthritis of left knee [M17.12].    11/23/2021:Assessment/Objective Findings:Patient is a73 year oldfemalewithcontinuedcomplaints of pain in her left knee following TKR. The following PT problems were noted uponRe-evaluation:Improved but stilldecreased strength of L knee flexion and extension, decreased L knee flexion AROM,symmetrical gait,poor SLS LLE, TTP and diffuse joint swelling of L knee. These problemscontinue tolimit the patient with the following functional activities:Pt unable to ambulate, perform stairs, lay supine without L knee pain. This patient willcontinue tobenefit from skilled PT plan of care. The prescribed treatment plan of care is medically  necessary secondary toPrimary osteoarthritis of left knee [M17.12].    Assessment/Objective Findings:Patient is a67 year oldfemalewith complaints of pain in her left knee following TKR. The following PT problems were noted upon evaluation: decreased strength of L knee flexion and extension, decreased L knee flexion AROM, antalgic gait with RW LLE, fair SLS LLE, TTP and diffuse joint swelling of L knee. These problems limit the patient with the following functional activities:Pt unable to ambulate, perform stairs, lay supine without L knee pain. This patient will benefit from skilled PT plan of care. The prescribed treatment plan of care is medically necessary secondary toPrimary osteoarthritis of left knee [M17.12].  Co-morbidities of  Patient Active Problem List:     BMI 33.0-33.9,adult  Pure hypercholesterolemia  Family history of malignant neoplasm of gastrointestinal tract  Tubular adenoma of colon  Hyperopia with astigmatism and presbyopia  Immature cataract  Hypertension, goal below 140/90  Adenomatous polyp of colon  Squamous 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 eyes  Alcohol abuse  Depression, recurrent (HCC)  Trauma and stressor-related disorder  Nontraumatic complete tear of right rotator cuff  Localized osteoarthritis of left knee  Obesity (BMI 30-39.9)  Chronic pain of left knee  Primary osteoarthritis of left knee  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  In my professional opinion this patient requires skilled physical therapy to address the concerns of the functional limitations outlined above.    Short Term Functional Goals:4 weeks.  Pt will demonstrate independence and compliance with HEP in2weeks3/27/2023:MET  Pt will demonstrate improved ROM ofL knee flexion to 110 degreesin 4weeks3/27/2023:MET  Pt will demonstrate improved SLS  ofLLEto 10 seconds LLE on solid surfacein 4weeks3/27/2023:NOT MET5/08/2021:MET  Pt will demonstrate improved strength  ofLLEby 1 MMT Grade in all rangesin 4weeks3/27/2023:MET  Pt will demonstrate improved gaitmechanicsby no antalgic gait LLEin 4weeks3/27/2023:MET  Pt will demonstrate improvedtoleranceof laying supineby 4/10 painin 4weeks3/27/2023:NOT MET5/08/2021:MET    Long Term Goal:8 weeks.  Pt will demonstrateability to perform stairs without LLE pain in 8 weeks.12/28/2021:NOT MET 02/01/2022: NOT MET 03/08/2022: NOT MET  Pt will demonstrateability to lay supine without LLE pain in 8 weeks.12/28/2021:NOT MET 02/01/2022: MET    Treatment Plan:** PT Eval - Low Complexity (CPT 97161)  ** PT Re-Eval (CPT 97164)  ** Stretching/ROM/Therapeutic Exercise [phone])  ** Home Exercise Program/ Patient Education (CPT [phone])  ** Neuromuscular Re-education (CPT [phone])  ** Manual Therapy / Joint / Soft tissue Mobilization (CPT 97140)  ** Gait Training (CPT [phone])  ** Functional Activities (CPT 97530)    Recommend Physical Therapy be continued1times per weekfor 8weeks.  The rehabilitation potential for this patient isexcellent  Based on comprehensive examination and evaluation, this patient meets criteria forLOWcomplexity due to combined factor(s) of stablepresentation of1-2body segment(s).    Patient Regis Bill is aware of attendance policy:Yes  Plan of care discussed with Patient/Family:Yes  Patient goals reviewed and incorporated in plan of care:Yes  Patient/Family agrees with plan of care:Yes  Patient/Family education:Yes  Does patient feel safe at home:Yes      Gladstone Pih, PT, DPT, HFS #[id number]      Gladstone Pih, PT, Lic # [id number]

## 2022-03-09 MED FILL — DULOXETINE 60MG: 30 days supply | Qty: 30 | Fill #1

## 2022-03-15 ENCOUNTER — Ambulatory Visit (HOSPITAL_BASED_OUTPATIENT_CLINIC_OR_DEPARTMENT_OTHER): Payer: No Typology Code available for payment source | Admitting: Rehabilitative and Restorative Service Providers"

## 2022-03-16 ENCOUNTER — Other Ambulatory Visit (HOSPITAL_BASED_OUTPATIENT_CLINIC_OR_DEPARTMENT_OTHER): Payer: Self-pay | Admitting: Surgical

## 2022-03-16 ENCOUNTER — Ambulatory Visit
Admission: RE | Admit: 2022-03-16 | Discharge: 2022-03-16 | Disposition: A | Discharge status: Home / Self Care | Payer: No Typology Code available for payment source | Attending: Surgical | Admitting: Surgical

## 2022-03-16 ENCOUNTER — Other Ambulatory Visit: Payer: Self-pay

## 2022-03-16 DIAGNOSIS — Z96652 Presence of left artificial knee joint: Secondary | ICD-10-CM | POA: Insufficient documentation

## 2022-03-16 DIAGNOSIS — M25462 Effusion, left knee: Secondary | ICD-10-CM | POA: Diagnosis present

## 2022-03-19 ENCOUNTER — Other Ambulatory Visit: Payer: Self-pay

## 2022-03-19 ENCOUNTER — Ambulatory Visit: Payer: No Typology Code available for payment source | Attending: Orthopaedic Surgery | Admitting: Orthopaedic Surgery

## 2022-03-19 DIAGNOSIS — Z96652 Presence of left artificial knee joint: Secondary | ICD-10-CM | POA: Insufficient documentation

## 2022-03-19 DIAGNOSIS — M7052 Other bursitis of knee, left knee: Secondary | ICD-10-CM | POA: Diagnosis not present

## 2022-03-19 DIAGNOSIS — G8929 Other chronic pain: Secondary | ICD-10-CM | POA: Diagnosis present

## 2022-03-19 DIAGNOSIS — M1712 Unilateral primary osteoarthritis, left knee: Secondary | ICD-10-CM | POA: Insufficient documentation

## 2022-03-19 DIAGNOSIS — M1711 Unilateral primary osteoarthritis, right knee: Secondary | ICD-10-CM | POA: Diagnosis not present

## 2022-03-19 DIAGNOSIS — M76892 Other specified enthesopathies of left lower limb, excluding foot: Secondary | ICD-10-CM | POA: Insufficient documentation

## 2022-03-19 DIAGNOSIS — M25562 Pain in left knee: Secondary | ICD-10-CM | POA: Insufficient documentation

## 2022-03-19 NOTE — Progress Notes (Signed)
Orthopedic Problem List:   1. S/p L TKR, DOS 10/12/21  2.  Right knee OA  2.  Left knee pes bursitis    HPI: This is a 61 year old year old [place] speaking female who presents for 5 months postop for her left knee replacement. She is doing well but has some pain on her left medial knee. The pain has been there preop and continues. It's non-radiating and stay about the level of the knee. It feels like an intermittent burning pain.She denies any recent injuries or distal numbness or tingling.  She will be prescribed some diclofenac but it did not help.  She takes occasional ibuprofen which works better.  She is working in housekeeping.  She has some pain with work.    She has arthritis of her right knee but is doing okay with this now.    She will be ordered an MRI of her left knee because of persistent left knee pain.    Physical Exam:  Vital Signs: LMP 07/11/2005   Gen: Alert and oriented, No acute distress. Pleasant and appropriate.   HEENT: Head atraumatic, normocephalic. Conjunctivae clear. EOMI.  Respiratory: No labored breathing. Speaking in full sentences  MSK: Left knee: incision is healed. No erythema, ecchymosis or drainage. Mild swelling to knee joint. TTP over the pes left knee.. AROM 0-110 degrees, Grossly stable to varus and valgus stress. Thigh and calf soft and non-tender. NVI distally, +EHL/FHL  Right knee with mild effusion.  Medial joint line tenderness.  Range of motion 0-110.  Stable to varus and valgus stress  Gait is stable which is amazing since she could barely put weight on her left leg without the knee collapsing prior to surgery.    Xrays: Of her left knee 10/3021 show the prosthesis in good position.  Left knee MRI to my read shows a little bit of fluid in the pes bursa    Assessment and Plan: This is a 61 year old year old [place] speaking female who presented for 5 months postop left TKR with persistent medial-sided knee pain.  She appears to have some pes  tendinitis/bursitis.  I offered an injection.  Patient agreed.    After timeout and using sterile technique and topical refrigerant, an injection of 40 mg Depo-Medrol and 2 cc 1% lidocaine was injected into the left knee pes bursa. Patient tolerated the procedure well with no complications.    She does not want an injection in her right knee because it did not really help in the past.    I encouraged her to keep walking, working, doing her exercises, and stretching.      I spent a total of 25 minutes on this visit on the date of service (total time includes all activities performed on the date of service exclusive of any procedures performed).         Trenton Founds, MD, 03/21/2022

## 2022-03-21 MED ORDER — METHYLPREDNISOLONE ACETATE 40 MG/ML IJ SUSP
40.00 mg | Freq: Once | INTRAMUSCULAR | 0 refills | Status: AC
Start: 2022-03-21 — End: 2022-03-21

## 2022-03-22 ENCOUNTER — Other Ambulatory Visit: Payer: Self-pay

## 2022-03-22 ENCOUNTER — Ambulatory Visit (HOSPITAL_BASED_OUTPATIENT_CLINIC_OR_DEPARTMENT_OTHER): Payer: No Typology Code available for payment source | Admitting: Rehabilitative and Restorative Service Providers"

## 2022-03-22 ENCOUNTER — Ambulatory Visit: Payer: No Typology Code available for payment source | Attending: Dermatology | Admitting: Dermatology

## 2022-03-22 DIAGNOSIS — L578 Other skin changes due to chronic exposure to nonionizing radiation: Secondary | ICD-10-CM | POA: Diagnosis present

## 2022-03-22 DIAGNOSIS — D229 Melanocytic nevi, unspecified: Secondary | ICD-10-CM | POA: Diagnosis present

## 2022-03-22 DIAGNOSIS — D485 Neoplasm of uncertain behavior of skin: Secondary | ICD-10-CM | POA: Insufficient documentation

## 2022-03-22 DIAGNOSIS — D2210 Melanocytic nevi of unspecified eyelid, including canthus: Secondary | ICD-10-CM | POA: Insufficient documentation

## 2022-03-22 NOTE — Progress Notes (Signed)
[facility]  [address], [place], [place] [address]    Dermatology Clinic Note    CC: lesion, skin check.   ?  HPI: Stephanie [name] is a 61 year old female     Lesion on the L temple, present for a few weeks. Bothers her, wants to get rid of it. I placed a referral tp plastics she reports she never got a call.   ?  ROS: negative, no other skin complaints.  Feels well, no systemic complaints.   ?  Past Dermatologic hx:  ?  PMH:  Patient Active Problem List:     BMI 33.0-33.9,adult     Pure hypercholesterolemia     Family history of malignant neoplasm of gastrointestinal tract     Tubular adenoma of colon     Hyperopia with astigmatism and presbyopia     Immature cataract     Hypertension, goal below 140/90     Adenomatous polyp of colon     Squamous 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 eyes     Alcohol abuse     Depression, recurrent (HCC)     Trauma and stressor-related disorder     Nontraumatic complete tear of right rotator cuff     Localized osteoarthritis of left knee     Obesity (BMI 30-39.9)     Chronic pain of left knee     Primary osteoarthritis of left knee     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  Meds:  ?     Medication List          Accurate as of December 21, 2021  2:03 PM. If you have any questions, ask your nurse or doctor.            CONTINUE taking these medications    dexamethasone 4 MG tablet  Commonly known as: DECADRON     Fish Oil 1000 MG Caps     Glucosamine 750 MG Tabs     Insulin Pen Needle 31G X 8 MM Misc  Inject under the skin     liraglutide 18 MG/3ML pen injector  Commonly known as: VICTOZA  Start 0.'6mg'$  x 1 wk, then 1.'2mg'$  x 1 wk and then 1.'8mg'$  x 1 wk     losartan 25 MG tablet  Commonly known as: COZAAR  Take 1 tablet by mouth in the morning.     omeprazole 40 MG capsule  Commonly known as: PriLOSEC  Take 1 capsule by mouth in the morning.     traZODone 50 MG tablet  Commonly known as: DESYREL  TAKE 1 TABLET BY MOUTH NIGHTLY     Turmeric 500 MG Caps           ?  All:  Review of Patient's Allergies indicates:  No Known Allergies?     SHx:  ?Social History    Tobacco Use      Smoking status: Former        Types: Cigarettes        Quit date: 07/27/2001        Years since quitting: 20.6        Passive exposure: Never      Smokeless tobacco: Never    Alcohol use: Yes      Alcohol/week: 6.0 standard drinks of alcohol      Types: 6 Cans of beer per week      Comment: 6 beers per week  Drug use: No       FHx:  Hx of melanoma - , hx of NMSC -  ?  Physical exam/Assessment/Plan:  Well appearing pt in NAD  Mood and affect are wnl  A full skin examination was performed including scalp, head, eyes, ears, nose,  lips,  neck, chest, axillae, abdomen, back, buttocks, bilateral upper extremities, bilateral lower extremities, hands, feet, fingers, toes, fingernails, and toenails.   Skin type: III    # Neoplasm of uncertain behavior   - Left temple with ~1cm subcutaneous nodule, no epidermal changes, favro cyst or lipoma  - will place new referral to plastics.    # Favor compound nevus  - Right lower lateral lid with 5x4 mm brown papule with non arborizing telangs. DDX also includes pigmented BCC  - unchanged, cont to monitor    # Benign nevi, scattered on trunk and extremities: benign appearing brown macules and papules, most <53m with no concerning features on exam or dermoscopy.   - Skin self-examination reviewed; ABCD's discussed.  - Sun protection and avoidance reviewed, including proper use of broad-spectrum UVB/UVA sunscreens with SPF 30 or greater, re-application after swimming and q 3-4 hours emphasized.?  Monitor:   - Left arm 10x159mirregular dark patch, but reassuring pattern, favor lentigo. Photo taken.     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 Benign tan macules with moth-eaten borders c/w solar lentigines present.   - Skin self-examination reviewed; ABCD's discussed.   ??- Sun protection and avoidance reviewed, including proper use of broad-spectrum UVB/UVA  sunscreens with SPF 30 or greater, re-application after swimming and q 3-4 hours emphasized.?      RTC: 26m30moot checks, in 1 year for TBSE  ?  ?  ?  ChaHarolyn RutherfordD  [facility]         CC: OxnOrie FishermanD  5 MM[id number]

## 2022-03-25 ENCOUNTER — Encounter (HOSPITAL_BASED_OUTPATIENT_CLINIC_OR_DEPARTMENT_OTHER): Payer: Self-pay

## 2022-03-29 ENCOUNTER — Ambulatory Visit (HOSPITAL_BASED_OUTPATIENT_CLINIC_OR_DEPARTMENT_OTHER): Payer: No Typology Code available for payment source | Admitting: Rehabilitative and Restorative Service Providers"

## 2022-04-05 ENCOUNTER — Ambulatory Visit (HOSPITAL_BASED_OUTPATIENT_CLINIC_OR_DEPARTMENT_OTHER): Payer: No Typology Code available for payment source

## 2022-04-12 ENCOUNTER — Ambulatory Visit (HOSPITAL_BASED_OUTPATIENT_CLINIC_OR_DEPARTMENT_OTHER): Payer: No Typology Code available for payment source | Admitting: Rehabilitative and Restorative Service Providers"

## 2022-04-13 ENCOUNTER — Ambulatory Visit (HOSPITAL_BASED_OUTPATIENT_CLINIC_OR_DEPARTMENT_OTHER): Payer: Self-pay | Admitting: Ophthalmology

## 2022-04-14 ENCOUNTER — Other Ambulatory Visit (HOSPITAL_BASED_OUTPATIENT_CLINIC_OR_DEPARTMENT_OTHER): Payer: Self-pay | Admitting: Family Medicine

## 2022-04-14 ENCOUNTER — Encounter (HOSPITAL_BASED_OUTPATIENT_CLINIC_OR_DEPARTMENT_OTHER): Payer: Self-pay | Admitting: Family Medicine

## 2022-04-14 MED ORDER — ATORVASTATIN CALCIUM 40 MG PO TABS
40.0000 mg | ORAL_TABLET | Freq: Every day | ORAL | 3 refills | Status: DC
Start: 2022-04-14 — End: 2023-07-22

## 2022-04-14 MED FILL — ATORVASTATIN 40MG: 90 days supply | Qty: 90 | Fill #0

## 2022-04-17 ENCOUNTER — Emergency Department
Admission: EM | Admit: 2022-04-17 | Discharge: 2022-04-17 | Disposition: A | Discharge status: Home / Self Care | Payer: No Typology Code available for payment source | Attending: Physician Assistant | Admitting: Physician Assistant

## 2022-04-17 ENCOUNTER — Other Ambulatory Visit: Payer: Self-pay

## 2022-04-17 ENCOUNTER — Emergency Department (HOSPITAL_BASED_OUTPATIENT_CLINIC_OR_DEPARTMENT_OTHER): Payer: No Typology Code available for payment source

## 2022-04-17 DIAGNOSIS — W19XXXA Unspecified fall, initial encounter: Secondary | ICD-10-CM | POA: Diagnosis not present

## 2022-04-17 DIAGNOSIS — S52592A Other fractures of lower end of left radius, initial encounter for closed fracture: Secondary | ICD-10-CM

## 2022-04-17 DIAGNOSIS — Y929 Unspecified place or not applicable: Secondary | ICD-10-CM | POA: Insufficient documentation

## 2022-04-17 DIAGNOSIS — S52502A Unspecified fracture of the lower end of left radius, initial encounter for closed fracture: Secondary | ICD-10-CM | POA: Insufficient documentation

## 2022-04-17 MED ORDER — ACETAMINOPHEN 500 MG PO TABS
1000.0000 mg | ORAL_TABLET | Freq: Once | ORAL | Status: AC
Start: 2022-04-17 — End: 2022-04-17
  Administered 2022-04-17: 1000 mg via ORAL
  Filled 2022-04-17: qty 2

## 2022-04-17 MED ORDER — OXYCODONE HCL 5 MG PO TABS
5.00 mg | ORAL_TABLET | Freq: Three times a day (TID) | ORAL | 0 refills | Status: AC | PRN
Start: 2022-04-17 — End: 2022-04-20

## 2022-04-17 MED ORDER — OXYCODONE HCL 5 MG PO TABS
5.00 mg | ORAL_TABLET | Freq: Once | ORAL | Status: AC
Start: 2022-04-17 — End: 2022-04-17
  Administered 2022-04-17: 5 mg via ORAL
  Filled 2022-04-17: qty 1

## 2022-04-17 MED ORDER — IBUPROFEN 600 MG PO TABS
600.0000 mg | ORAL_TABLET | Freq: Once | ORAL | Status: AC
Start: 2022-04-17 — End: 2022-04-17
  Administered 2022-04-17: 600 mg via ORAL
  Filled 2022-04-17: qty 1

## 2022-04-17 MED FILL — OXYCODONE 5MG: 3 days supply | Qty: 9 | Fill #0

## 2022-04-17 NOTE — Discharge Instructions (Addendum)
You were seen and evaluated emergency department today with concern for a left wrist injury.    Your x-ray did show that you have a broken bone of your forearm.    You were placed in a splint and the splint should remain on until you are seen by our orthopedic clinic.  Keep the splint dry by covering with a plastic bag while bathing.  You are being referred to our orthopedic specialist. Expect a phone call from their office within the next week to schedule your follow up visit. Please ensure that your phone number is correct in MyChart and that your voicemail box is set up and not full. If you do miss their call, please call [phone] to schedule your appointment.     If you get the splint wet or if you develop worsening pain or numbness in your hand or fingers you should return to the emergency department to be reevaluated.    Please use acetaminophen (Tylenol) and ibuprofen (Motrin) for pain control.   Take  acetaminophen (Tylenol) 650 MG (2 regular strength tablets) every 6 hours.   Take ibuprofen (Motrin/Advil)  600 MG (3 tablets) every 6 hours with food.     You can take them together every 6 hours OR alternate between Tylenol and Motrin by taking one medication every three hours. Wait 6 hours between the SAME kind of medication. For example: You could take tylenol at 12pm, then ibuprofen at 3pm, then tylenol again at 6pm.    Taking excessive amounts of these medications can cause serious and even life-threatening consequences, check the ingredients of any other medications you are taking (prescribed or over-the-counter) to avoid taking too much.     If your pain is still intolerable after taking Tylenol and ibuprofen you may take a dose of the prescribed oxycodone that was sent to your pharmacy. Oxycodone is an opioid medication, it can make you feel drowsy or disoriented, is important that you do not drive or drink alcohol while taking this medication.  This medication has the potential for abuse, do not  share this medication with others, if you do not use the entire course dispose of remaining pills safely.  See the attached instructions for further information.          Voc foi visto e avaliado no departamento de emergncia hoje com preocupao por uma leso no pulso esquerdo.    Seu raio-x mostrou que voc quebrou um osso do [place].    Voc foi colocado em uma tala e a Passenger transport manager at [employer] seja visto por nossa clnica ortopdica. Mantenha a tala seca cobrindo-a com um saco plstico durante o banho.  Voc est sendo encaminhado para nosso especialista em ortopedia. Espere um telefonema de seu escritrio na prxima semana para agendar sua visita de acompanhamento. Certifique-se de que seu nmero de telefone esteja correto no MyChart e que sua caixa de correio de voz esteja configurada e no esteja cheia. Se voc perder a ligao, ligue para [phone] para agendar Dianne Dun.    Se voc molhar a tala ou se sentir piora da dor ou dormncia na mo ou nos dedos, voc deve retornar ao pronto-socorro para ser [employer].    Por favor, use acetaminofeno (Tylenol) e ibuprofeno (Motrin) para controle da dor.  Tome acetaminofeno (Tylenol) 650 mg (2 comprimidos de fora regular) a cada 6 horas.  Tome ibuprofeno (Motrin/Advil) 600 mg (3 comprimidos) a cada 6 horas com alimentos.    Voc pode tom-los juntos a  cada 6 horas OU alternar entre Tylenol e Motrin tomando um medicamento a cada trs horas. Esperar 6 horas entre o MESMO tipo de [place]. Por exemplo: voc pode tomar tylenol s 12h, depois ibuprofeno s 15h e depois tylenol novamente s 18h.    Tomar quantidades excessivas desses medicamentos pode causar consequncias graves e at [employer], verifique os ingredientes de qualquer outro medicamento que esteja tomando (prescrito ou sem receita) para Careers adviser.    Se sua dor ainda for intolervel aps tomar Tylenol e ibuprofeno, voc pode tomar uma dose da oxicodona prescrita que foi enviada   sua farmcia. A oxicodona  um medicamento opioide, pode fazer voc se sentir sonolento ou desorientado,  importante que voc no dirija ou beba lcool enquanto estiver tomando [employer]. Este medicamento tem potencial para abuso, no compartilhe este medicamento com outras pessoas, se voc no usar todo o curso, descarte os comprimidos restantes com segurana. Consulte as instrues em anexo para obter mais informaes.

## 2022-04-17 NOTE — Narrator Note (Signed)
Patient Disposition  Patient education for diagnosis, medications, activity, diet and follow-up.  Patient left ED 3:11 PM.  Patient rep received written instructions.    Interpreter to provide instructions: Yes    Patient belongings with patient: YES    Have all existing LDAs been addressed? N/A    Have all IV infusions been stopped? N/A    Destination: Discharged to home. Discharge instructions and prescription reviewed with pt, verbalized understanding. Pt will f/u with ortho, contact number provided. Sling provided. Pt ambulating out of department without difficulty

## 2022-04-17 NOTE — Narrator Note (Signed)
Covering Primary RN for lunch break   Pt walked to X ray
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
## 2022-04-19 MED FILL — DULOXETINE 60MG: 30 days supply | Qty: 30 | Fill #2

## 2022-04-22 ENCOUNTER — Encounter (HOSPITAL_BASED_OUTPATIENT_CLINIC_OR_DEPARTMENT_OTHER): Payer: Self-pay

## 2022-04-23 ENCOUNTER — Ambulatory Visit: Payer: No Typology Code available for payment source | Attending: Orthopaedic Surgery | Admitting: Orthopaedic Surgery

## 2022-04-23 ENCOUNTER — Ambulatory Visit
Admission: RE | Admit: 2022-04-23 | Discharge: 2022-04-23 | Disposition: A | Discharge status: Home / Self Care | Payer: No Typology Code available for payment source

## 2022-04-23 ENCOUNTER — Encounter (HOSPITAL_BASED_OUTPATIENT_CLINIC_OR_DEPARTMENT_OTHER): Payer: Self-pay

## 2022-04-23 ENCOUNTER — Other Ambulatory Visit: Payer: Self-pay

## 2022-04-23 DIAGNOSIS — S52502A Unspecified fracture of the lower end of left radius, initial encounter for closed fracture: Secondary | ICD-10-CM | POA: Diagnosis present
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
## 2022-04-23 NOTE — H&P (Signed)
Orthopedic Surgical H&P    CC: Patient presents with:  ER F/U: Left wrist    ORTHOPEDIC PROBLEM LIST:  Left distal radius fracture, DOI: 04/17/22  2.    Right knee osteoarthritis   3.    S/p left TKA, DOS: 10/12/21     HPI: Stephanie [name] is a 61 year old female who presents today for evaluation of a new injury to her left wrist. States she was leaning against her bike to take a picture on 04/17/22 when she fell and landed onto her outstretched left hand. There was immediate pain and swelling. She was seen in the ED where x-rays showed a left distal radius fracture. She was placed into a splint and provided with a prescription for oxycodone which she has been taking for pain. She has been using the splint at all times. She found it rather uncomfortable. She has some pain in the wrist but it is very manageable. There is no numbness or tingling distally. She also has an abrasion on her elbow from the fall. 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 She did go to work yesterday but it was very difficult in the splint. She denies any prior history of injuries to the left wrist before this.    [place] speaking telephone interpreter was used to conduct this visit.    ROS: Negative for fever, chills, dizziness, nausea, vomiting, chest pain, shortness of breath.    PMH: Past Medical History:  02/12/2016: Chronic right shoulder pain  No date: Depression  03/20/2012: Diverticulosis of colon      Comment:  Noted on colonoscopy 6/13   08/16/2010: Elevated hemoglobin A1c      Comment:  HEMOGLOBIN A1C (%) Date Value 03/06/2015 5.6                05/20/2014 5.4 05/08/2012 6.0 (H)   No results found for:               POCA1C   11/21/2012: Hyperopia with astigmatism and presbyopia  No date: Hypertension  11/21/2012: Immature cataract  01/21/2012: Impingement syndrome of right shoulder      Comment:  10/14: pt taking  [place] med (meloxicam 7.5 mg BID                and cyclobenzaprine); Will ask  for refill once supply                finished; PCP ok with refill of flexeril on temporary                basis;   01/26/2008: Irritable bladder      Comment:  Seen by Dr Jeraldine Loots 5/09, no incontinence, likely                irritable bladder.  Trial of anticholinergic, refer to                urology, has appt Dr Minna Antis 02/23/08  02/16/2005: Leiomyoma of uterus, unspecified      Comment:  Dr Jeraldine Loots hysterectomy November, 2006; cervix retained,               last pap 11/11 neg Repeat pap/HPV 07/2015   02/15/2014: Mass of hand  07/30/2011: Other plastic surgery for unacceptable cosmetic appearance      Comment:  Breast augmentation scheduled 08/02/11;  Dr.  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, left  01/08/2016: Schistosomiasis  08/13/2016: Squamous 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   eyes  06/18/2004: Urinary calculus, unspecified    Surgical HX: Past Surgical History:  No date: BREAST ENHANCEMENT SURGERY      Comment:  12/12 had bilateral mammoplasty and saline implants, had               2nd surgery 5/30 for revision  No date: MAMMAPLASTY AUGMENTATION W/PROSTHETIC IMPLANT  No date: MASTOPEXY  No date: OB ANTEPARTUM CARE CESAREAN DLVR & POSTPARTUM      Comment:  x3  No date: REDUCTION MAMMAPLASTY  No date: TOTAL ABDOMINAL HYSTERECT W/WO RMVL TUBE OVARY      Comment:  for fibroids, still has cervix    SH:   Social History     Socioeconomic History    Marital status: Divorced     Spouse name: Not on file    Number of children: Not on file    Years of education: Not on file    Highest education level: Not on file   Occupational History    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; 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   Tobacco Use    Smoking status: Former     Types: Cigarettes     Quit date: 07/27/2001     Years since quitting: 20.7     Passive  exposure: Never    Smokeless tobacco: Never   Substance and Sexual Activity    Alcohol use: Yes     Alcohol/week: 6.0 standard drinks of alcohol     Types: 6 Cans of beer per week     Comment: 6 beers per week     Drug use: No    Sexual activity: Not Currently     Partners: Male     Comment: G3P3, no hx STD, no hx abn Pap   Other Topics Concern    Not on file   Social History Narrative    08/03/19 Divorced, has 3 children, lives alone, works as a Education administrator lady    Social Determinants of Librarian, academic Strain: Not on file  Food Insecurity: Not on file  Transportation Needs: Not on file  Physical Activity: Not on file  Stress: Not on file  Social Connections: Not on file  Intimate Partner Violence: Not on file  Housing Stability: Not on file    Family Hx: Review of patient's family history indicates:  Problem: Cancer - Other      Relation: Father          Age of Onset: (Not Specified)          Comment: stomach  Problem: Heart      Relation: Brother          Age of Onset: (Not Specified)          Comment: died at 3 during valve replacement  Problem: Heart      Relation: Brother          Age of Onset: (Not Specified)          Comment: chestpain with neg w/u  Problem: Heart      Relation: Mother          Age of Onset: (Not Specified)          Comment: Died  of MI age 79  Problem: Diabetes      Relation: Mother          Age of Onset: (Not Specified)    Allergies: Review of Patient's Allergies indicates:  No Known Allergies    Current Medications:   Current Outpatient Medications:     atorvastatin (LIPITOR) 40 MG tablet, Take 1 tablet by mouth in the morning., Disp: 90 tablet, Rfl: 3    DULoxetine (CYMBALTA) 60 MG capsule, Take 1 capsule by mouth in the morning. Stop taking duloxetine '30mg'$ ., Disp: 30 capsule, Rfl: 4    losartan (COZAAR) 25 MG tablet, Take 1 tablet by mouth in the morning., Disp: 90 tablet, Rfl: 2    Insulin Pen Needle 31G X 8 MM, Inject under the skin, Disp: 100 each, Rfl: 0    traZODone  (DESYREL) 50 MG tablet, TAKE 1 TABLET BY MOUTH NIGHTLY, Disp: 90 tablet, Rfl: 2    Glucosamine 750 MG TABS, Take by mouth, Disp: , Rfl:     Omega-3 Fatty Acids (FISH OIL) 1000 MG CAPS, Take by mouth, Disp: , Rfl:     Turmeric 500 MG CAPS, Take by mouth, Disp: , Rfl:     dexamethasone (DECADRON) 4 MG tablet, Take 4 mg by mouth in the morning and 4 mg in the evening. Take with meals., Disp: , Rfl:     omeprazole (PRILOSEC) 40 MG capsule, Take 1 capsule by mouth in the morning., Disp: 30 capsule, Rfl: 11    IMAGING: X-ray of the left wrist from 04/17/2022 shows a comminuted mildly displaced distal radius fracture. Fracture lines extend into the radiocarpal joint. There is osteopenia. Imaging was reviewed by myself and Dr. Madelaine Etienne today.    PHYSICAL EXAM:  GENERAL: Alert and oriented, in no acute distress.   MOOD: Appropriate.   NECK: Supple without lymphadenopathy.   MUSCULOSKELETAL: Examination of the left wrist shows slight deformity of the distal radius. Overlying skin is warm, dry, and intact. There is no erythema. There is diffuse ecchymosis and edema throughout the wrist and hand. There is mild tenderness to palpation over the distal radius. ROM deferred given injury. Able to wiggle all fingers of left hand. Neurovascularly intact distally. 2+ radial pulse. Small abrasion over left elbow otherwise skin is warm and dry. Full ROM of the elbow.    ASSESSMENT/PLAN: Patient is a 61 year old female who presents today for evaluation of a new injury to her left wrist. She fell on 04/17/2022 and unfortunately sustained a comminuted and mildly displaced intra-articular left distal radius fracture. Given the nature of her fracture, we recommend surgical fixation with ORIF. This is scheduled for 04/26/2022. She was educated regarding NPO after midnight the night before surgery and was provided with CHG wipes and instructions. She will receive a call to discuss her arrival time as well as a call from PAT. She is provided with  an urgent OT referral for several days after surgery next week for a custom splint and to begin ROM exercises. She is placed into a new custom splint today to wear until surgery.    I spent a total of 45 minutes on this visit on the date of service (total time includes all activities performed on the date of service).    Patient was seen and examined with Dr. Madelaine Etienne who formulated the treatment plan.    Denice Paradise, PA-C, 04/23/2022

## 2022-04-23 NOTE — Patient Instructions (Signed)
PRE-OPERATIVE INSTRUCTIONS   1. Please use the CHG wipes you were given the night before surgery and the morning of surgery on your whole body besides your face and private parts.  2. Do not eat or drink anything after midnight the night before surgery.  3. Shower the night before surgery and use clean sheets on your bed.  4. You will receive a phone call from our secretary to tell you what time to arrive on your day of surgery.   5. You may receive a phone call from anesthesia to review your health and medications prior to surgery.  6. If you have serious medical issues, you may need to come in to meet with anesthesia for pre-admission testing. If this is the case, our secretary will call you to inform you of the date and time of the appointment.   7. If you are given Aspirin or Lovenox post-operatively, these are to prevent blood clots. It is extremely important that you take these as prescribed.   8. If you take Aspirin or non-steroidal medications (NSAIDs) such as Ibuprofen, Aleve, Mobic, or Relafen, stop these 7 days prior to surgery. If you have a cardiac stent or are on other blood thinners such as Warfarin or Plavix, we will need to discuss you taking these medications prior to surgery with your cardiologist.   9. If you have any questions, don't hesitate to call us at [phone].

## 2022-04-23 NOTE — Progress Notes (Addendum)
Orthopedic Surgical H&P    CC: Patient presents with:  ER F/U: Left wrist    ORTHOPEDIC PROBLEM LIST:  Left distal radius fracture, DOI: 04/17/22  2.    Right knee osteoarthritis   3.    S/p left TKA, DOS: 10/12/21     HPI: Stephanie [name] is a 61 year old female who presents today for evaluation of a new injury to her left wrist. States she was leaning against her bike to take a picture on 04/17/22 when she fell and landed onto her outstretched left hand. There was immediate pain and swelling. She was seen in the ED where x-rays showed a left distal radius fracture. She was placed into a splint and provided with a prescription for oxycodone which she has been taking for pain. She has been using the splint at all times. She found it rather uncomfortable. She has some pain in the wrist but it is very manageable. There is no numbness or tingling distally. She also has an abrasion on her elbow from the fall. 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 She did go to work yesterday but it was very difficult in the splint. She denies any prior history of injuries to the left wrist before this.    [place] speaking telephone interpreter was used to conduct this visit.    ROS: Negative for fever, chills, dizziness, nausea, vomiting, chest pain, shortness of breath.    PMH: Past Medical History:  02/12/2016: Chronic right shoulder pain  No date: Depression  03/20/2012: Diverticulosis of colon      Comment:  Noted on colonoscopy 6/13   08/16/2010: Elevated hemoglobin A1c      Comment:  HEMOGLOBIN A1C (%) Date Value 03/06/2015 5.6                05/20/2014 5.4 05/08/2012 6.0 (H)   No results found for:               POCA1C   11/21/2012: Hyperopia with astigmatism and presbyopia  No date: Hypertension  11/21/2012: Immature cataract  01/21/2012: Impingement syndrome of right shoulder      Comment:  10/14: pt taking  [place] med (meloxicam 7.5 mg BID                and cyclobenzaprine); Will ask  for refill once supply                finished; PCP ok with refill of flexeril on temporary                basis;   01/26/2008: Irritable bladder      Comment:  Seen by Dr Jeraldine Loots 5/09, no incontinence, likely                irritable bladder.  Trial of anticholinergic, refer to                urology, has appt Dr Minna Antis 02/23/08  02/16/2005: Leiomyoma of uterus, unspecified      Comment:  Dr Jeraldine Loots hysterectomy November, 2006; cervix retained,               last pap 11/11 neg Repeat pap/HPV 07/2015   02/15/2014: Mass of hand  07/30/2011: Other plastic surgery for unacceptable cosmetic appearance      Comment:  Breast augmentation scheduled 08/02/11;  Dr.  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, left  01/08/2016: Schistosomiasis  08/13/2016: Squamous 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   eyes  06/18/2004: Urinary calculus, unspecified    Surgical HX: Past Surgical History:  No date: BREAST ENHANCEMENT SURGERY      Comment:  12/12 had bilateral mammoplasty and saline implants, had               2nd surgery 5/30 for revision  No date: MAMMAPLASTY AUGMENTATION W/PROSTHETIC IMPLANT  No date: MASTOPEXY  No date: OB ANTEPARTUM CARE CESAREAN DLVR & POSTPARTUM      Comment:  x3  No date: REDUCTION MAMMAPLASTY  No date: TOTAL ABDOMINAL HYSTERECT W/WO RMVL TUBE OVARY      Comment:  for fibroids, still has cervix    SH:   Social History     Socioeconomic History    Marital status: Divorced     Spouse name: Not on file    Number of children: Not on file    Years of education: Not on file    Highest education level: Not on file   Occupational History    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; 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   Tobacco Use    Smoking status: Former     Types: Cigarettes     Quit date: 07/27/2001     Years since quitting: 20.7     Passive  exposure: Never    Smokeless tobacco: Never   Substance and Sexual Activity    Alcohol use: Yes     Alcohol/week: 6.0 standard drinks of alcohol     Types: 6 Cans of beer per week     Comment: 6 beers per week     Drug use: No    Sexual activity: Not Currently     Partners: Male     Comment: G3P3, no hx STD, no hx abn Pap   Other Topics Concern    Not on file   Social History Narrative    08/03/19 Divorced, has 3 children, lives alone, works as a Education administrator lady    Social Determinants of Librarian, academic Strain: Not on file  Food Insecurity: Not on file  Transportation Needs: Not on file  Physical Activity: Not on file  Stress: Not on file  Social Connections: Not on file  Intimate Partner Violence: Not on file  Housing Stability: Not on file    Family Hx: Review of patient's family history indicates:  Problem: Cancer - Other      Relation: Father          Age of Onset: (Not Specified)          Comment: stomach  Problem: Heart      Relation: Brother          Age of Onset: (Not Specified)          Comment: died at 50 during valve replacement  Problem: Heart      Relation: Brother          Age of Onset: (Not Specified)          Comment: chestpain with neg w/u  Problem: Heart      Relation: Mother          Age of Onset: (Not Specified)          Comment: Died  of MI age 30  Problem: Diabetes      Relation: Mother          Age of Onset: (Not Specified)    Allergies: Review of Patient's Allergies indicates:  No Known Allergies    Current Medications:   Current Outpatient Medications:     atorvastatin (LIPITOR) 40 MG tablet, Take 1 tablet by mouth in the morning., Disp: 90 tablet, Rfl: 3    DULoxetine (CYMBALTA) 60 MG capsule, Take 1 capsule by mouth in the morning. Stop taking duloxetine '30mg'$ ., Disp: 30 capsule, Rfl: 4    losartan (COZAAR) 25 MG tablet, Take 1 tablet by mouth in the morning., Disp: 90 tablet, Rfl: 2    Insulin Pen Needle 31G X 8 MM, Inject under the skin, Disp: 100 each, Rfl: 0    traZODone  (DESYREL) 50 MG tablet, TAKE 1 TABLET BY MOUTH NIGHTLY, Disp: 90 tablet, Rfl: 2    Glucosamine 750 MG TABS, Take by mouth, Disp: , Rfl:     Omega-3 Fatty Acids (FISH OIL) 1000 MG CAPS, Take by mouth, Disp: , Rfl:     Turmeric 500 MG CAPS, Take by mouth, Disp: , Rfl:     dexamethasone (DECADRON) 4 MG tablet, Take 4 mg by mouth in the morning and 4 mg in the evening. Take with meals., Disp: , Rfl:     omeprazole (PRILOSEC) 40 MG capsule, Take 1 capsule by mouth in the morning., Disp: 30 capsule, Rfl: 11    IMAGING: X-ray of the left wrist from 04/17/2022 shows a comminuted mildly displaced distal radius fracture. Fracture lines extend into the radiocarpal joint. There is osteopenia. Imaging was reviewed by myself and Dr. Madelaine Etienne today.    PHYSICAL EXAM:  GENERAL: Alert and oriented, in no acute distress.   MOOD: Appropriate.   NECK: Supple without lymphadenopathy.   MUSCULOSKELETAL: Examination of the left wrist shows slight deformity of the distal radius. Overlying skin is warm, dry, and intact. There is no erythema. There is diffuse ecchymosis and edema throughout the wrist and hand. There is mild tenderness to palpation over the distal radius. ROM deferred given injury. Able to wiggle all fingers of left hand. Neurovascularly intact distally. 2+ radial pulse. Small abrasion over left elbow otherwise skin is warm and dry. Full ROM of the elbow.    ASSESSMENT/PLAN: Patient is a 61 year old female who presents today for evaluation of a new injury to her left wrist. She fell on 04/17/2022 and unfortunately sustained a comminuted and mildly displaced intra-articular left distal radius fracture. Given the nature of her fracture, we recommend surgical fixation with ORIF. This is scheduled for 04/26/2022. She was educated regarding NPO after midnight the night before surgery and was provided with CHG wipes and instructions. She will receive a call to discuss her arrival time as well as a call from PAT. She is provided with  an urgent OT referral for several days after surgery next week for a custom splint and to begin ROM exercises. She is placed into a new custom splint today to wear until surgery.    We spent a total of 45 minutes on this visit on the date of service (total time includes all activities performed on the date of service).    Patient was seen and examined with Dr. Madelaine Etienne who formulated the treatment plan.    Denice Paradise, PA-C, 04/23/2022    Patient seen and examined with Denice Paradise, PA-C.  This note was written by  Tamaka Sawin and edited by me so that it reflects my findings, assessment and plan. I agree with the note. I spent more than 50% of joint visit with the patient and did the substantive part of the visit and decision making.    I have been seeing this patient for knee OA and she had a total knee February 2023.  She has a comminuted distal radius fracture with significant shortening.  She is an active 61 yo and would benefit from ORIF.  Discussed with patient and she signed consent form.  Booking placed for next week.        Deeann Saint, MD, 04/28/2022

## 2022-04-23 NOTE — Surgery Pre-Op (Signed)
PAT Visit          Stephanie [name] is a 61 year old female received a preoperative screening.        Surgery Information       Future Procedures (Tomorrow to 04/23/2023)         Date Time Visit Type/Procedure Providers Location Status        04/26/2022 1130 ORIF, FRACTURE, RADIUS, DISTAL - Left von [facility] OR Scheduled      Visit Type/Procedure: ORIF, FRACTURE, RADIUS, DISTAL - Left                          Procedure(s):  ORIF, FRACTURE, RADIUS, DISTAL  04/26/2022        .     Language of care:Portuguese ([place])    Allergy History  Patient has no known allergies.      Past Medical History:  02/12/2016: Chronic right shoulder pain  No date: Depression  03/20/2012: Diverticulosis of colon      Comment:  Noted on colonoscopy 6/13   08/16/2010: Elevated hemoglobin A1c      Comment:  HEMOGLOBIN A1C (%) Date Value 03/06/2015 5.6                05/20/2014 5.4 05/08/2012 6.0 (H)   No results found for:               POCA1C   11/21/2012: Hyperopia with astigmatism and presbyopia  No date: Hypertension  11/21/2012: Immature cataract  01/21/2012: Impingement syndrome of right shoulder      Comment:  10/14: pt taking  [place] med (meloxicam 7.5 mg BID                and cyclobenzaprine); Will ask for refill once supply                finished; PCP ok with refill of flexeril on temporary                basis;   01/26/2008: Irritable bladder      Comment:  Seen by Dr Jeraldine Loots 5/09, no incontinence, likely                irritable bladder.  Trial of anticholinergic, refer to                urology, has appt Dr Minna Antis 02/23/08  02/16/2005: Leiomyoma of uterus, unspecified      Comment:  Dr Jeraldine Loots hysterectomy November, 2006; cervix retained,               last pap 11/11 neg Repeat pap/HPV 07/2015   02/15/2014: Mass of hand  07/30/2011: Other plastic surgery for unacceptable cosmetic appearance      Comment:  Breast augmentation scheduled 08/02/11;  Dr.  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         Alcohol,  Tobacco and Drug History:  Social History    Tobacco Use      Smoking status: Former        Types: Cigarettes        Quit date: 07/27/2001        Years since quitting: 20.7        Passive exposure: Never      Smokeless tobacco: Never    E-Cigarette/Vaping  E-Cigarette Use: Never User  Quit Date:   Nicotine:   Start Date:   THC:    Cartridges/Day:   CBD:   Other Substance:   Flavoring:       Alcohol use   Yes   6.0 standard drinks of alcohol/week   6 Cans of beer per week      Comment:   6 beers per week           Drug use:   No       Extended Emergency Contact Information  Primary Emergency Contact: GARCIA,FELIPE  Address: [address]           [place], [place] [address] [place] Phone: [phone]  Relation: Son        Current Outpatient Medications   Medication Sig    Multiple Vitamin (MULTIVITAMIN) TABS Take 1 tablet by mouth in the morning.    atorvastatin (LIPITOR) 40 MG tablet Take 1 tablet by mouth in the morning.    DULoxetine (CYMBALTA) 60 MG capsule Take 1 capsule by mouth in the morning. Stop taking duloxetine '30mg'$ .    losartan (COZAAR) 25 MG tablet Take 1 tablet by mouth in the morning.    Insulin Pen Needle 31G X 8 MM Inject under the skin    traZODone (DESYREL) 50 MG tablet TAKE 1 TABLET BY MOUTH NIGHTLY    Glucosamine 750 MG TABS Take by mouth    Omega-3 Fatty Acids (FISH OIL) 1000 MG CAPS Take by mouth     Turmeric 500 MG CAPS Take by mouth    dexamethasone (DECADRON) 4 MG tablet Take 4 mg by mouth in the morning and 4 mg in the evening. Take with meals.    omeprazole (PRILOSEC) 40 MG capsule Take 1 capsule by mouth in the morning.     No current facility-administered medications for this encounter.         (Not in a hospital admission)      PAT Assessment    Airways symptoms: DENTURES/PARTIAL: Yes    Airways WDL     Activity tolerance (How many flights of stairs): no sob or cp with stairs      Assistive Device: None           PAT Screening     Row Name 04/23/22 1612       Integumentary - Complete full head to toe skin assessment    Integumentary (WDL) WDL    Row Name 04/23/22 1613       STOP-Bang Questionaire     Do you snore loudly (loud enough to be heard through closed doors or   your bed-partner elbows you for snoring at night? 1    Do you often feel Tired, fatigued, or Sleepy during the daytime (such as   falling asleep when driving)? 1    Has anyone observed you stop breathing, or choking/gasping during your   sleep? 1    Do you have, or are being treated for, High Blood Pressure? 1    Height 4'  11" (1.499 m)    Weight 72.1 kg (159 lb)    BMI (Calculated) 32.18    Neck size large? 1    Stop-Bang Score High                       04/23/22  1613   Weight: 72.1 kg (159 lb)   Height: '4\' 11"'$  (1.499 m)           Patient issues currently are       Phone screen interview completed with patient, all meds and pre op instructions reviewed with expressed understanding.       Donalda Ewings RN.

## 2022-04-23 NOTE — Discharge Instructions (Addendum)
INSTRUES DO PR-OPERATRIO PARA O SURGICAL DAY CARE   SURGICAL DAY CARE PRE-OPERATIVE INSTRUCTIONS    DIA DA [place] em: [place] (Monday), Agosto (August) 28 s (Time): 9:30 am    Arrive at:  Registration on (Day of the Week, Month, Day, at (Time).       From now until after surgery do not take any Aspirin, or Ibuprofen which includes - Naproxen,Advil ,Motrin, and Alleve. You may take Tylenol.       obrigatrio ter um adulto responsvel disponvel para acompanh-lo at [employer] a [place]. (Sugerimos que voc tenha algum disponvel para ajud-lo em casa aps a sua cirurgia).  You must have a responsible adult available to accompany you home after surgery. (We suggest that you have someone available to assist you at home after your surgery).    INSTRUES:   INSTRUCTIONS:  Voc no poder comer ou beber nada aps a meia-noite da noite anterior  cirurgia, nem mesmo gua, goma de mascar ou balas.   You may have nothing to eat or drink after midnight the night before your surgery, not even water, gum or hard candy.    Voc no poder fumar na manh do dia da sua cirurgia.  You may not smoke the morning of your surgery.    Por favor, deixe em casa todos os objetos de valor, incluindo joias, relgios, dinheiro, etc.  Please leave all valuables at home, including jewelry, watches, money, etc.    Remova qualquer esmalte das unhas antes de chegar ao Surgical Day Care e no use qualquer maquiagem ou batom.  Please remove all fingernail polish before arriving at Surgical Day Care and do not  wear any face or lip make-up.    Se for fazer cirurgia ocular, no aplique maquiagem nos olhos ou no rosto e evite hidratantes faciais e perfumes.  If you are having eye surgery, do not wear any eye or face makeup and avoid facial moisturizers and perfumes.    Por favor, retire quaisquer extenses de cabelo que possam ser [employer].  Please take out any hair extensions  that can be removed.    No raspe os pelos da rea da cirurgia.  Do not shave surgical site.    No use lentes de contato.  Do not wear contact lenses.    MEDICAMENTOS:   MEDICATIONS:     Tome a medicao a seguir na noite anterior  cirurgia, na hora de dormir: usual medicines   Take the following medication the night before surgery at bedtime:     [place] a seguir na manh do dia da sua cirurgia, com apenas um gole de gua:   atorvastatin  omeprazole  duloxetine  Take the following medication the morning of your surgery with only a sip of water:

## 2022-04-23 NOTE — Progress Notes (Signed)
Patient was fitted with left medium sling. Given by Sabino Dick, under the direction of Dr. Madelaine Etienne. Instructions were provided and patient expressed understanding. Patient was made aware that they may receive a bill for the sling if their insurance does not cover it.

## 2022-04-26 ENCOUNTER — Ambulatory Visit (HOSPITAL_BASED_OUTPATIENT_CLINIC_OR_DEPARTMENT_OTHER): Payer: No Typology Code available for payment source

## 2022-04-26 ENCOUNTER — Ambulatory Visit (HOSPITAL_BASED_OUTPATIENT_CLINIC_OR_DEPARTMENT_OTHER)
Admission: RE | Disposition: A | Discharge status: Home / Self Care | Payer: Self-pay | Source: Ambulatory Visit | Attending: Orthopaedic Surgery

## 2022-04-26 ENCOUNTER — Other Ambulatory Visit: Payer: Self-pay

## 2022-04-26 ENCOUNTER — Ambulatory Visit
Admission: RE | Admit: 2022-04-26 | Discharge: 2022-04-26 | Disposition: A | Discharge status: Home / Self Care | Payer: No Typology Code available for payment source | Attending: Orthopaedic Surgery | Admitting: Orthopaedic Surgery

## 2022-04-26 ENCOUNTER — Encounter (HOSPITAL_BASED_OUTPATIENT_CLINIC_OR_DEPARTMENT_OTHER): Payer: Self-pay | Admitting: Orthopaedic Surgery

## 2022-04-26 DIAGNOSIS — Z96652 Presence of left artificial knee joint: Secondary | ICD-10-CM | POA: Insufficient documentation

## 2022-04-26 DIAGNOSIS — I10 Essential (primary) hypertension: Secondary | ICD-10-CM | POA: Diagnosis not present

## 2022-04-26 DIAGNOSIS — R0781 Pleurodynia: Secondary | ICD-10-CM

## 2022-04-26 DIAGNOSIS — W19XXXA Unspecified fall, initial encounter: Secondary | ICD-10-CM | POA: Insufficient documentation

## 2022-04-26 DIAGNOSIS — Z79899 Other long term (current) drug therapy: Secondary | ICD-10-CM | POA: Diagnosis not present

## 2022-04-26 DIAGNOSIS — S52572A Other intraarticular fracture of lower end of left radius, initial encounter for closed fracture: Secondary | ICD-10-CM | POA: Insufficient documentation

## 2022-04-26 DIAGNOSIS — Z87891 Personal history of nicotine dependence: Secondary | ICD-10-CM | POA: Insufficient documentation

## 2022-04-26 DIAGNOSIS — S52502A Unspecified fracture of the lower end of left radius, initial encounter for closed fracture: Secondary | ICD-10-CM | POA: Insufficient documentation

## 2022-04-26 HISTORY — DX: Obstructive sleep apnea (adult) (pediatric): G47.33

## 2022-04-26 SURGERY — ORIF, FRACTURE, RADIUS, DISTAL
Anesthesia: Regional | Site: Wrist | Laterality: Left | Wound class: Class I/ Clean

## 2022-04-26 MED ORDER — FENTANYL CITRATE 0.05 MG/ML IJ SOLN
INTRAMUSCULAR | Status: AC
Start: 2022-04-26 — End: 2022-04-26
  Filled 2022-04-26: qty 2

## 2022-04-26 MED ORDER — LIDOCAINE HCL (PF) 2 % IJ SOLN
INTRAMUSCULAR | Status: AC
Start: 2022-04-26 — End: 2022-04-26
  Filled 2022-04-26: qty 5

## 2022-04-26 MED ORDER — VANCOMYCIN FOR INTRAWOUND
1.0000 g | Freq: Once | Status: DC
Start: 2022-04-26 — End: 2022-04-26
  Administered 2022-04-26: 1 g

## 2022-04-26 MED ORDER — ACETAMINOPHEN 325 MG PO TABS
650.00 mg | ORAL_TABLET | Freq: Four times a day (QID) | ORAL | 0 refills | Status: AC | PRN
Start: 2022-04-26 — End: 2022-05-10

## 2022-04-26 MED ORDER — LIDOCAINE HCL (PF) 2 % IJ SOLN
Freq: Once | INTRAMUSCULAR | Status: DC | PRN
Start: 2022-04-26 — End: 2022-04-26
  Administered 2022-04-26: 100 mg via INTRAVENOUS

## 2022-04-26 MED ORDER — PROPOFOL 200 MG/20ML IV EMUL
INTRAVENOUS | Status: AC
Start: 2022-04-26 — End: 2022-04-26
  Filled 2022-04-26: qty 20

## 2022-04-26 MED ORDER — SUGAMMADEX SODIUM 200 MG/2ML IV SOLN
INTRAVENOUS | Status: DC
Start: 2022-04-26 — End: 2022-04-26
  Filled 2022-04-26: qty 4

## 2022-04-26 MED ORDER — SCOPOLAMINE 1 MG/3DAYS TD PT72
MEDICATED_PATCH | TRANSDERMAL | Status: AC
Start: 2022-04-26 — End: 2022-04-26
  Filled 2022-04-26: qty 1

## 2022-04-26 MED ORDER — CEFAZOLIN IN SODIUM CHLORIDE 3-0.9 GM/100ML-% IV SOLN
3.00 g | Freq: Once | INTRAVENOUS | Status: AC
Start: 2022-04-26 — End: 2022-04-26
  Administered 2022-04-26: 3 g via INTRAVENOUS
  Filled 2022-04-26: qty 100

## 2022-04-26 MED ORDER — ONDANSETRON HCL 4 MG/2ML IJ SOLN
Freq: Once | INTRAMUSCULAR | Status: DC | PRN
Start: 2022-04-26 — End: 2022-04-26
  Administered 2022-04-26: 4 mg via INTRAVENOUS

## 2022-04-26 MED ORDER — DEXAMETHASONE SODIUM PHOSPHATE 4 MG/ML IJ SOLN
INTRAMUSCULAR | Status: AC
Start: 2022-04-26 — End: 2022-04-26
  Filled 2022-04-26: qty 2

## 2022-04-26 MED ORDER — FENTANYL CITRATE 0.05 MG/ML IJ SOLN
25.0000 ug | INTRAMUSCULAR | Status: DC | PRN
Start: 2022-04-26 — End: 2022-04-26

## 2022-04-26 MED ORDER — OXYCODONE HCL 5 MG PO TABS
5.00 mg | ORAL_TABLET | ORAL | 0 refills | Status: AC | PRN
Start: 2022-04-26 — End: 2022-04-29

## 2022-04-26 MED ORDER — ROPIVACAINE HCL 5 MG/ML IJ SOLN
INTRAMUSCULAR | Status: AC
Start: 2022-04-26 — End: 2022-04-26
  Administered 2022-04-26: 20 mL

## 2022-04-26 MED ORDER — PROPOFOL INFUSION
INTRAVENOUS | Status: AC
Start: 2022-04-26 — End: 2022-04-26
  Filled 2022-04-26: qty 50

## 2022-04-26 MED ORDER — MIDAZOLAM HCL 2 MG/2 ML IJ SOLN
INTRAMUSCULAR | Status: AC
Start: 2022-04-26 — End: 2022-04-26
  Filled 2022-04-26: qty 2

## 2022-04-26 MED ORDER — ROCURONIUM BROMIDE 100 MG/10ML IV SOLN
INTRAVENOUS | Status: AC
Start: 2022-04-26 — End: 2022-04-26
  Filled 2022-04-26: qty 10

## 2022-04-26 MED ORDER — DEXAMETHASONE SODIUM PHOSPHATE 4 MG/ML IJ SOLN
Freq: Once | INTRAMUSCULAR | Status: DC | PRN
Start: 2022-04-26 — End: 2022-04-26
  Administered 2022-04-26: 8 mg via INTRAVENOUS

## 2022-04-26 MED ORDER — IBUPROFEN 800 MG PO TABS
800.00 mg | ORAL_TABLET | Freq: Three times a day (TID) | ORAL | 0 refills | Status: AC | PRN
Start: 2022-04-26 — End: 2022-05-10

## 2022-04-26 MED ORDER — HYDROMORPHONE HCL 2 MG/ML IJ SOLN (SUPER ERX)
Freq: Once | Status: DC | PRN
Start: 2022-04-26 — End: 2022-04-26
  Administered 2022-04-26: .4 mg via INTRAVENOUS
  Administered 2022-04-26: .2 mg via INTRAVENOUS

## 2022-04-26 MED ORDER — LACTATED RINGERS IV SOLN
INTRAVENOUS | Status: DC
Start: 2022-04-26 — End: 2022-04-26

## 2022-04-26 MED ORDER — BUPIVACAINE-EPINEPHRINE (PF) 0.5% -1:200000 IJ SOLN
INTRAMUSCULAR | Status: AC
Start: 2022-04-26 — End: 2022-04-26
  Administered 2022-04-26: 20 mL via SUBCUTANEOUS
  Filled 2022-04-26: qty 30

## 2022-04-26 MED ORDER — PROPOFOL 200 MG/20 ML IV - AN
Freq: Once | INTRAVENOUS | Status: DC | PRN
Start: 2022-04-26 — End: 2022-04-26
  Administered 2022-04-26: 150 ug/kg/min via INTRAVENOUS
  Administered 2022-04-26: 160 mg via INTRAVENOUS
  Administered 2022-04-26: 100 ug/kg/min via INTRAVENOUS

## 2022-04-26 MED ORDER — VANCOMYCIN HCL 1 G IV SOLR
INTRAVENOUS | Status: DC
Start: 2022-04-26 — End: 2022-04-26
  Filled 2022-04-26: qty 1

## 2022-04-26 MED ORDER — ROPIVACAINE HCL 5 MG/ML IJ SOLN
INTRAMUSCULAR | Status: AC
Start: 2022-04-26 — End: 2022-04-26
  Filled 2022-04-26: qty 30

## 2022-04-26 MED ORDER — LACTATED RINGERS IV BOLUS
INTRAVENOUS | Status: DC | PRN
Start: 2022-04-26 — End: 2022-04-26

## 2022-04-26 MED ORDER — FENTANYL CITRATE 0.05 MG/ML IJ SOLN
Freq: Once | INTRAMUSCULAR | Status: DC | PRN
Start: 2022-04-26 — End: 2022-04-26
  Administered 2022-04-26 (×2): 50 ug via INTRAVENOUS

## 2022-04-26 MED ORDER — SCOPOLAMINE 1 MG/3DAYS TD PT72
MEDICATED_PATCH | Freq: Once | TRANSDERMAL | Status: DC | PRN
Start: 2022-04-26 — End: 2022-04-26
  Administered 2022-04-26: 1 via TRANSDERMAL

## 2022-04-26 MED ORDER — OXYCODONE-ACETAMINOPHEN 5-325 MG PO TABS
1.0000 | ORAL_TABLET | ORAL | Status: DC | PRN
Start: 2022-04-26 — End: 2022-04-26

## 2022-04-26 MED ORDER — HYDROMORPHONE HCL 2 MG/ML IJ SOLN (SUPER ERX)
Status: AC
Start: 2022-04-26 — End: 2022-04-26
  Filled 2022-04-26: qty 1

## 2022-04-26 MED FILL — ACETAMINOPHEN 325 MG TAB: 13 days supply | Qty: 100 | Fill #0 | Status: CP

## 2022-04-26 MED FILL — IBUPROFEN 800MG: 14 days supply | Qty: 42 | Fill #0 | Status: CP

## 2022-04-26 MED FILL — OXYCODONE 5MG: 3 days supply | Qty: 18 | Fill #0 | Status: CP

## 2022-04-26 SURGICAL SUPPLY — 41 items
A I MING GUIDES 1.5MM (SURGGDE) ×2 IMPLANT
ACE BANDAGE STERILE 4IN L/F (BANDAGE) ×1 IMPLANT
AO CONN SQUAR TIP DRIVER 2.0MM (SURGDVR) ×2 IMPLANT
BACK TABLE COVER 44X76 (DRAPE) ×2 IMPLANT
BASIC SINGLE BASIN TRAY (BASIN) ×1 IMPLANT
CERAMENT BONE VOID FILLER 5ML ×1 IMPLANT
CHLORAPREP APPLICAOR 26ML ORGN (PREP) ×1 IMPLANT
CONFORM BANDAGE 3IN (BANDAGE) ×1 IMPLANT
CORTICAL LCKING SCREW 3.5X10MM ×1 IMPLANT
CORTICAL LCKING SCREW 3.5X11MM ×1 IMPLANT
CORTICAL LCKING SCREW 3.5X12MM ×1 IMPLANT
DRESSING SPONGE 4X4 (DRESSING) ×2 IMPLANT
ELECTROSURGICAL GROUNDING PAD (CAUTERY) ×1 IMPLANT
ELECTROSURGICAL SAFETY HOLSTER (CAUTERY) ×1 IMPLANT
GEMINUS VOLAR DISTAL RADIUS PL ×1 IMPLANT
HI COMP LCKING PEG 2.7 X 21MM ×2 IMPLANT
HYDROSET XT 5CC ×1 IMPLANT
LCKING THREAD PAG 2.3X20MM ×1 IMPLANT
LCKING THREAD PEG 2.3X19MM ×1 IMPLANT
LCKING THREAD PEG 2.3X21MM ×1 IMPLANT
MESH STERILE LARGE FINGER TRAP (CAST&SPL) ×2 IMPLANT
MESH STERILE MED FINGER TRAY (CAST&SPL) ×2 IMPLANT
MESH STERILE SMALL FINGER TRAP (CAST&SPL) ×2 IMPLANT
MESH STERILE X-LG FINGER TRAP (CAST&SPL) ×2 IMPLANT
MINI C-ARM DRAPE FLUOROSCAN (DRAPE) ×1 IMPLANT
NON LCKING CORT SCREW 3.5X10MM IMPLANT
NON LCKING CORT SCREW 3.5X11MM IMPLANT
PACK HAND (PACK) ×1 IMPLANT
SOLID SIDE CUT DRILL 2.0X40MM (SURGDRL) ×1 IMPLANT
SOLID SIDE CUT DRILL 2.5X40MM (SURGDRL) ×1 IMPLANT
SPONGE,8X4,XRAY,RFID (DRESSING) ×1 IMPLANT
STERILE LIGHTHANDLE COVER (SURGEQUP) ×2 IMPLANT
STERILE UNDERCAST PADDING 3IN (CAST&SPL) ×1 IMPLANT
SURGEON BLADE #15 (SURGBLA) ×2 IMPLANT
SURGICAL GOWN XXL (SURGPPE) ×1 IMPLANT
SUTURE POLYSORB 3-0 V20 30IN (SUTURE) ×1 IMPLANT
SUTURE VLOC ABS 3-0 P14 18IN (SUTURE) ×1 IMPLANT
SYRINGE BULB IRRIGATION (SYRINGE) ×1 IMPLANT
TEGADERM DRESSING 2 3/8 X 2.75 (DRESSING) ×1 IMPLANT
THREADED PEG LOCKING 2.3MM X 14MM ×1 IMPLANT
UNIVERSAL QUICK CONN DRIVE T10 (SURGDVR) ×1 IMPLANT

## 2022-04-26 NOTE — Anesthesia Postprocedure Evaluation (Addendum)
Anesthesia Post-Operative Evaluation Note    Patient: Stephanie [name]           Procedure Summary       Date: 04/26/22 Room / Location: [facility] / [place]    Anesthesia Start: 1244 Anesthesia Stop:     Procedure: ORIF, FRACTURE, RADIUS, DISTAL (Left: Wrist) Diagnosis:       Closed fracture of distal end of left radius, unspecified fracture morphology, initial encounter      (Closed fracture of distal end of left radius, unspecified fracture morphology, initial encounter [S52.502A])    Surgeons: Deeann Saint, MD Responsible Provider: Reginia Naas, MD    Anesthesia Type: general, regional ASA Status: 3              POST-OPERATIVE EVALUATION    Anesthesia Post Evaluation    Vitals signs in patient's normal range: Yes  Respiratory function stable; airway patent: Yes  Cardiovascular function stable: Yes  Hydration status stable: Yes  Mental status recovered; patient participates in evaluation and/or is at baseline: Yes  Pain control satisfactory: Yes  Nausea and vomiting control satisfactory: YesProcedure was labor & delivery no  PostOP disposition PACU  Anesthesia Observation no significant observation    Anesthesia MIPS:    The patient is a current smoker (G9642)(e.g. cigarette, cigar, pipe, e-cigarette/vaping/marijuana): No  Emergent - Exclusion case (X5056): No  Anesthesia start to Anesthesia end time was 60 minutes or longer (4255F): Yes  Patient received an inhalational anesthetic (4554F): Yes  Patient exhibits three or more risk factors for PONV (4556F): Yes  Patient RECEIVED at least 2 prophylactic Rx PONV anti-emetic agents of different classes preop and/or intraop (P7948): Yes  Pediatric patient?: No    [employer] # [id number]          Last vitals    BP: 127/84 (04/26/2022 12:30 PM)  Temp: 97.1 F (36.2 C) (04/26/2022 10:09 AM)  Pulse: 76 (04/26/2022 12:30 PM)  Resp: 27 (04/26/2022 12:30 PM)  SpO2: 99 % (04/26/2022 12:30 PM)

## 2022-04-26 NOTE — Anesthesia Preprocedure Evaluation (Signed)
Pre-Anesthetic Note  .      Patient: Stephanie [name] is a 61 year old female      Procedure Information       Date/Time: 04/26/22 1130    Procedure: ORIF, FRACTURE, RADIUS, DISTAL (Left)    Diagnosis: Closed fracture of distal end of left radius, unspecified fracture morphology, initial encounter [S52.502A]    Pre-op diagnosis: Closed fracture of distal end of left radius, unspecified fracture morphology, initial encounter [S52.502A]    Location: [facility] 01 / [facility] OR    Surgeons: Deeann Saint, MD            Relevant Problems   No relevant active problems           Previous Anesthetic History:   Past Surgical History:  No date: BREAST ENHANCEMENT SURGERY      Comment:  12/12 had bilateral mammoplasty and saline implants, had               2nd surgery 5/30 for revision  No date: MAMMAPLASTY AUGMENTATION W/PROSTHETIC IMPLANT  No date: MASTOPEXY  No date: OB ANTEPARTUM CARE CESAREAN DLVR & POSTPARTUM      Comment:  x3  No date: REDUCTION MAMMAPLASTY  No date: TOTAL ABDOMINAL HYSTERECT W/WO RMVL TUBE OVARY      Comment:  for fibroids, still has cervix  No date: TOTAL KNEE REPLACEMENT; Left      Comment:  2/23        Medications  Current Facility-Administered Medications   Medication   . bupivacaine 0.5%-EPINEPHrine 1:200,000 (PF) 0.5% -1:200000 injection   . ceFAZolin (ANCEF) IVPB 3 g/100 mL NS (premix)   . vancomycin (VANCOCIN) for surgical intrawound 1 g   . lactated ringers infusion         Allergies:   Review of Patient's Allergies indicates:  No Known Allergies    Smoking, Alcohol, Drugs:  Social History    Tobacco Use      Smoking status: Former        Types: Cigarettes        Quit date: 07/27/2001        Years since quitting: 20.7        Passive exposure: Never      Smokeless tobacco: Never    Alcohol use: Yes      Alcohol/week: 6.0 standard drinks of alcohol      Types: 6 Cans of beer per week      Comment: 6 beers per week       Drug use:   No       PMHx:  Past Medical History:  02/12/2016: Chronic right shoulder  pain  No date: Depression  03/20/2012: Diverticulosis of colon      Comment:  Noted on colonoscopy 6/13   08/16/2010: Elevated hemoglobin A1c      Comment:  HEMOGLOBIN A1C (%) Date Value 03/06/2015 5.6                05/20/2014 5.4 05/08/2012 6.0 (H)   No results found for:               POCA1C   11/21/2012: Hyperopia with astigmatism and presbyopia  No date: Hypertension  11/21/2012: Immature cataract  01/21/2012: Impingement syndrome of right shoulder      Comment:  10/14: pt taking  [place] med (meloxicam 7.5 mg BID                and cyclobenzaprine); Will ask for refill once supply  finished; PCP ok with refill of flexeril on temporary                basis;   01/26/2008: Irritable bladder      Comment:  Seen by Dr Jeraldine Loots 5/09, no incontinence, likely                irritable bladder.  Trial of anticholinergic, refer to                urology, has appt Dr Minna Antis 02/23/08  02/16/2005: Leiomyoma of uterus, unspecified      Comment:  Dr Jeraldine Loots hysterectomy November, 2006; cervix retained,               last pap 11/11 neg Repeat pap/HPV 07/2015   02/15/2014: Mass of hand  07/30/2011: Other plastic surgery for unacceptable cosmetic appearance      Comment:  Breast augmentation scheduled 08/02/11;  Dr.  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   Physical Activity in METs greater than 4    Hepatic:  Positive for  other liver disease (Schistosomiasis).   Gastrointestinal:  Positive for GERD.   Psychiatric:  Positive for depression and anxiety.        Physical Exam    General     Level of consciousness:  Alert   BMI   BMI greater than 30 kg/m2   Airway     Mallampati:  II    TM distance:  >3 FB    Mouth opening:  >3 FB    Neck ROM:  Full   Teeth    (+) upper dentures  }   Heart   - normal exam     Lungs - normal exam                 Pertinent Labs:   Lab Results   Component Value Date    NA 137 10/13/2021    K 4.1 10/13/2021    CREAT 0.6 10/13/2021    GLUCOSER 121 10/13/2021    WBC 10.5 10/14/2021    HCT 27.8 (L) 10/14/2021    PLTA 197 10/14/2021    PT 11.6 08/03/2021    APTT 28.2 08/03/2021    INR 1.1 08/03/2021         Anesthesia Plan    ASA Score:     ASA:  3    Airway:      Mallampati:  II    Mouth opening:  >3 FB    Neck ROM:  Full    TM distance:  >3 FB     Plan: general and regional    Other information:     EKG Reviewed: : No  Full Stomach Precaution:: No      Post-Plan::  PACU    Informed Consent:     Anesthetic plan and risks discussed with:  Patient   Patient Consented

## 2022-04-26 NOTE — Interval H&P Note (Signed)
Patient Assessment Update: (Fill out Prior to procedure or within 24 hours of  admission if H&P done pre-admission.)   Re-evaluation including history review and physical examination has been performed.    Patient's Condition No Change  Used wipes.    Deeann Saint, MD, 04/26/2022

## 2022-04-26 NOTE — Discharge Instructions (Signed)
INSTRUES DE ALTO APS CIRUGIA DE MO E PULSO  DISCHARGE INSTRUCTIONS AFTER WRIST AND HAND SURGERY    Procedimento cirrgico feito hoje: Reparo de fratura da mo ou pulso - (Fracture repair hand or wrist)  Surgical procedure performed today:         ?  Carpal Tunnel Release    ?  [facility] Arthroplasty  ?  Wrist Ganglion Excision   ?  Wrist  Fusion  ?  Trigger Finger Release    ?  Fracture repair hand or wrist        ?  Tendon Repair     ?  Other: __________________    Consultas: Possivelmente havero agendado 2 a 3 consultas com seu cirurgio ou Assistente Mdico.  Appointments: You will possibly be scheduled for 2 to 3 appointments with your Surgeon or PA.    Terapia Ocupacional:  Pode ser que voc comece a fazer Nurse, children's de 3   4 dias aps a cirurgia.  Mantenha esta consulta, uma vez que esta contribuir para  o resultado do seu tratamento.  Occupational therapy:  You may be scheduled to start therapy 3 to 4 days after surgery.  Keep this appointment as it will affect the result of your treatment.     1a consulta - Com o seu cirurgio ou Assistente Mdico. Ser em torno de 2 semanas aps a cirurgia:  Ser  para trocar o curativo e retirar os pontos.    1st appointment - With your surgeon or PA. will be about 2 weeks after surgery:  This is to change the dressing and remove the stitches.      2  consulta - entre 4 a 5 semanas aps a cirurgia.  2nd appointment - between 4 and 5 weeks after surgery.    1. Para diminuir o inchao, repouse com o brao operado elevado em travesseiros com      a mo acima do nvel do corao.      To decrease swelling, rest with your operated arm raised up on pillows with your hand      higher than your heart.    2. Terapia de gelo: Use compressas de gelo  mo e punho 20 minutos a cada hora.      No use gelo pelo restante da hora. (use por 20 minutos / tire por 40 minutos)      Ice Therapy: Use ice packs to the hand and wrist 20 minutes every hour. No ice for the      rest of that  hour. (20 minutes on/ 40 minutes off)    3. Continue movendo os dedos da mo operada, para evitar inchao e rigidez. Tente      fazer um punho.       Keep moving your fingers on your operative hand to avoid swelling and stiffness. Try to      make a full fist.    4. No retire o curativo e tala, at [employer] Se o curativo estiver muito apertado, pea para algum desfazer e enrolar novamente.  Leave the bandages and splint on until you have your appointment. If the bandage feels too tight, have someone loosen it up and rewrap it.    5.  normal sentir um  pouco de dor e inchao aps a cirurgia. Com o tempo, ir melhororar. Se voc deitar e elevar a mo sobre  travesseiros, ir se Warden/ranger.   It is normal to have some pain  and swelling after surgery. It will get better over time. If you lie down and raise your hand on pillows, it will feel better.    6. O anestsico aplicado em seu brao lentamente vai perder o efeito. Comece a tomar      o medicamento prescrito assim que voc comear a sentir a dor. Tome o      medicamento conforme prescrito. NO tome medicamentos a mais do que voc      deve tomar. Tome menos conforme sentir-se melhor. Pare de tomar quando estiver      reabilitado.      The numbing medicine put into your arm will slowly wear off. Start the prescription      medicine as soon as you start to feel the pain. Take the medicine as directed. Do NOT      take more medicine than you are supposed to. Take less as you feel better. Stop      when you are ready.    7. Dieta: Tomar mais lquidos na primeira noite. Comece sua alimentaco normal quando      voc sentir vontade de comer novamente.      Diet: Drink extra fluid the first night. Start you usual diet when you feel like eating again.    8. Banhando-se:  Manter curativo e tala secos e fora do Health visitor. Cubra com um saco      de plstico quando voc tomar banho de [place].      Bathing: Keep dressing and splint dry and out of  the shower. Cover with a plastic bag      when you shower.    9. Se voc tiver febre mais alta que 101F, dormncia ou formigamento que no melhora      a cada dia, secreo passando atravs do curativo, nusea e vmito que no passa,      ou dor que no se [place] com o medicamento receitado, ligue para o consultrio.       If you have a fever over 101 F, numbness or tingling that does not get better each day,      drainage that comes through the bandage,  nausea and vomiting that lasts or pain that      does not get better with prescribed medicine, call the office.      [facility]  [place], [place] [address]  [phone] [facility]  [place], [place] [address]  [phone] [facility]  [address]  [place], [place] [address]  [phone] [facility]  [address]  [place], [place] [address]  [phone]       Depois da hora de expediente para todos os hospitais   (5:00 pm-8:30am) favor ligar para [phone].  After hours for all campuses (5:00 pm-8:30am)   please call [phone].
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
## 2022-04-26 NOTE — Anesthesia Procedure Notes (Addendum)
Peripheral Block    Patient location during procedure: pre-op  Start time: 04/26/2022 12:30 PM  End time: 04/26/2022 12:40 PM    Authorized by: Reginia Naas, MD    Performed by: Reginia Naas, MD    Reason for block: procedure for pain, at surgeon's request and post-op pain management    Preanesthetic Checklist  Completed: patient identified, site marked, surgical consent, pre-op evaluation, timeout performed, IV checked, risks and benefits discussed and monitors and equipment checked  Peripheral Block  Patient position: supine  Prep: ChloraPrep  Patient monitoring: heart rate, cardiac monitor and continuous pulse ox  Block type: adductor canal and supraclavicular  Laterality: left  Injection technique: single-shot  Procedures: ultrasound guided  Local infiltration: lidocaine  Needle  Needle gauge: 20 G  Needle length: 80 mm  Needle localization: ultrasound guidance and anatomical landmarks  Assessment  Injection assessment: negative aspiration for heme, incremental injection, transient paresthesias and local visualized surrounding nerve on ultrasound  Paresthesia pain: immediately resolved  Heart rate change: no  Slow fractionated injection: yes    Medications Administered  ropivacaine (NAROPIN) injection 0.5% - Infiltration, Left Arm   20 mL - 04/26/2022 12:40:00 PM (Ropivacaine 0.25% + Precedex 46mg volume 20 cc in block site.)    Billing Statements:  Local anesthetic visualized around the nerve  Picture documentation stored in record  Additional Notes  Ropivacaine 0.25% + Precedex 469m volume 20 cc in block site.

## 2022-04-28 NOTE — Addendum Note (Signed)
Addended by: Madelaine Etienne, Annaliese Saez on: 04/28/2022 12:43 PM     Modules accepted: Level of Service

## 2022-04-29 ENCOUNTER — Encounter (HOSPITAL_BASED_OUTPATIENT_CLINIC_OR_DEPARTMENT_OTHER): Payer: Self-pay | Admitting: Occupational Therapy

## 2022-04-29 ENCOUNTER — Telehealth (HOSPITAL_BASED_OUTPATIENT_CLINIC_OR_DEPARTMENT_OTHER): Payer: Self-pay | Admitting: Orthopedic
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
## 2022-04-29 NOTE — Telephone Encounter (Addendum)
-----   Message from Jeraldine Loots sent at 04/29/2022  2:35 PM EDT -----  Regarding: Dr.Von Deck pt - Swollen arm and thump, post-op  Contact: [phone]  Pt had surgery on 8/28. Pt is saying that her left elbow and thump are purple and swollen. Pt is concerned and in discomfort. Pt would like to speak to a nurse as soon as possible.   Spoke with patient assisted by [place] interpreter. Pain level described as =3 using Tylenol for pain management. Has not used any narcotic.   Was evaluated in person by OT, [name]. (See note). Reassured [patient again today that the discoloring of her hand and elbow are the result of surgery but will decrease with time. Able to use her operative fingers to pick things up.    Stated she was reassured re: her progress.   Will keep her scheduled appointment with OT as scheduled.

## 2022-05-04 ENCOUNTER — Other Ambulatory Visit: Payer: Self-pay

## 2022-05-04 ENCOUNTER — Ambulatory Visit (HOSPITAL_BASED_OUTPATIENT_CLINIC_OR_DEPARTMENT_OTHER): Payer: No Typology Code available for payment source | Admitting: Occupational Therapy

## 2022-05-04 DIAGNOSIS — S52502A Unspecified fracture of the lower end of left radius, initial encounter for closed fracture: Secondary | ICD-10-CM | POA: Diagnosis present

## 2022-05-04 DIAGNOSIS — S52502D Unspecified fracture of the lower end of left radius, subsequent encounter for closed fracture with routine healing: Secondary | ICD-10-CM
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
## 2022-05-05 NOTE — Progress Notes (Signed)
I certify that the documented Treatment Plan is reasonable and necessary.    05/05/2022  [name], PA-C

## 2022-05-12 ENCOUNTER — Other Ambulatory Visit: Payer: Self-pay

## 2022-05-12 ENCOUNTER — Ambulatory Visit (HOSPITAL_BASED_OUTPATIENT_CLINIC_OR_DEPARTMENT_OTHER): Payer: No Typology Code available for payment source | Admitting: Occupational Therapy

## 2022-05-12 DIAGNOSIS — S52502D Unspecified fracture of the lower end of left radius, subsequent encounter for closed fracture with routine healing: Secondary | ICD-10-CM

## 2022-05-12 DIAGNOSIS — S52502A Unspecified fracture of the lower end of left radius, initial encounter for closed fracture: Secondary | ICD-10-CM | POA: Diagnosis present

## 2022-05-12 NOTE — Progress Notes (Addendum)
S:  "I had to go back to work.  I have to make money."  No pain per pt.    O:       05/12/22 1400   Language Information   Language of Care Portuguese   Interpreter Yes   Name/ID Iona Beard #[id number]   Precautions   Precautions Yes   Rehab Discipline   Rehab Discipline OT   Visit   Visit number 2   POC Due date 06/03/22   Time Calculation   Start Time 1400   Stop Time 1430   Time Calculation (min) 30 min   Pain   Pain Score 0    Ther Exercise   Exercise AROM wrist flexion   Reps 10   Sets 2   Ther Exercise 2   Exercise AROM wrist ext   Reps 2 10   Sets 2 2   Ther Exercise 3   Exercise 3 AROM sup/pro   Reps 3 10   Sets 3 2   Ther Exercise 4   Exercise 4 AROM RD/UD   Reps 4 10   Sets 4 2   Ther Exercise 5   Exercise 5 AROM L shoulder   Reps 5 10   Sets 5 1   Ther Exercise 6   Exercise 6 TGE   Reps 6 10   Sets 6 1   Ther Exercise 7   Exercise 7 opposition   Reps 7 10   Sets 7 2   Other   Miscellaneous DSD c xeroform   Splinting/Bracing re mold volar wrist orthosis   Patient Education   What was taught? Review HEP, activity restrictions   Method Verbal;Demo;Practice   Patient comprehension Yes       A:  Pt doing well with HEP.  Decreased swelling through L hand and digits, with improved AROM.  Able to make full fist and fully extend digits.  No pain with AROM per pt.  Volar wrist orthosis remolded due to decreased swelling, and pt states she is wearing it full time as directed.  Reviewed importance of NWB and no lifting, pushing/pulling L UE.  Pt states she has RTW out of necessity but tries to use L hand as little as possible.  Incision clean and dry, with intact steri's.  DSD and xeroform applied.  Skin tear proximal forearm slightly yellow looking - cleaned with 50/50 soln and non stick dsg applied.  Pt reports she has been changing  bandage on this at home, and will apply bacterial ointment.  She will have orthopedics assess on Friday.    P;  Cont T 1x/wk for ROM, scar/edema massage, pt ed.    [name], [place], [place] #  [id number]

## 2022-05-13 MED FILL — DULOXETINE 60MG: 30 days supply | Qty: 30 | Fill #3

## 2022-05-14 ENCOUNTER — Other Ambulatory Visit: Payer: Self-pay

## 2022-05-14 ENCOUNTER — Encounter (HOSPITAL_BASED_OUTPATIENT_CLINIC_OR_DEPARTMENT_OTHER): Payer: Self-pay | Admitting: Physician Assistant

## 2022-05-14 ENCOUNTER — Ambulatory Visit: Payer: No Typology Code available for payment source | Attending: Orthopaedic Surgery | Admitting: Physician Assistant

## 2022-05-14 DIAGNOSIS — S52502D Unspecified fracture of the lower end of left radius, subsequent encounter for closed fracture with routine healing: Secondary | ICD-10-CM | POA: Diagnosis not present

## 2022-05-14 NOTE — Progress Notes (Signed)
Orthopedic Office Note    CC: Patient presents with:  Surgical Followup      ORTHOPEDIC PROBLEM LIST:  1. S/p left distal radius ORIF DOS: 04/18/2022  2.    HPI: Stephanie [name] is a 61 year old female who presents for follow-up for her left wrist. Patient is 2 and half weeks out from surgery.     PT: She has gotten a removable splint from OT and has started therapy.  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  She she has an assistance to do the lifting and does not bear weight on the left wrist at all.  Denies numbness or tingling distally.      ROS: Reviewed by me and all other systems are negative except as noted in HPI.    IMAGING: x-ray of the left wrist Intra-Op reveals left distal radius fracture with orthopedic hardware in place.  Imaging was personally reviewed during this visit.     PHYSICAL EXAM:  GENERAL: Alert and oriented, in no acute distress  MOOD: Appropriate.   MUSCULOSKELETAL: Left wrist with well approximated volar incision.  No drainage or erythema.  Mild to moderate swelling of the hand and wrist.  Nontender to palpation.  She can make a fist.  She has about 10 degrees of extension and flexion of the wrist.  Neurovascularly intact distally.    ASSESSMENT/PLAN: 61 year old female status post left distal radius ORIF. Patient is 2-1/2 weeks out from surgery.  She is doing well overall.  She is minimal pain.  Continue Tylenol as needed.  The incision is healing well.  New steri-strips applied. The patient was instructed that they can now get the incision wet in the shower.     Continue OT and HEP.  It is fine for her to work but I did remind her again that she needs to avoid all lifting pushing and pulling until next follow-up in 4 weeks.    Follow up in 4 weeks.    We reviewed the likely diagnosis, the prognosis, and various treatment options in detail. Risks and benefits of treatment plan discussed. The patient's questions have been answered, and the patient understands agrees with treatment  plan.       [name], PA-C, 05/14/2022      Nursing Communication:    __X_ [name]: Left wrist  ___ [name] in splint/cast  ___ XOA out of splint/cast  ___ Cast removal   ___ MRI/CT review  ___ Surgical booking (vitals)  ___ None

## 2022-05-17 ENCOUNTER — Other Ambulatory Visit (HOSPITAL_BASED_OUTPATIENT_CLINIC_OR_DEPARTMENT_OTHER): Payer: Self-pay | Admitting: Family Medicine

## 2022-05-17 NOTE — Telephone Encounter (Signed)
[facility], Stephanie [name] is a 61 year old female has requested a refill of omeprazole (PRILOSEC) 40 MG capsule       Last Office Visit:05/14/2022  with Hoff, T  Last Physical Exam: 02/21/2013    There are no preventive care reminders to display for this patient.    Other Med Adult:  Most Recent BP Reading(s)  04/26/22 : 102/75        Cholesterol (mg/dL)   Date Value   11/30/2021 237     LOW DENSITY LIPOPROTEIN DIRECT (mg/dL)   Date Value   11/30/2021 135     HIGH DENSITY LIPOPROTEIN (mg/dL)   Date Value   11/30/2021 49     TRIGLYCERIDES (mg/dL)   Date Value   11/30/2021 254 (H)         THYROID SCREEN TSH REFLEX FT4 (uIU/mL)   Date Value   08/03/2021 1.460         TSH (THYROID STIM HORMONE) (uIU/mL)   Date Value   12/10/2016 0.790       HEMOGLOBIN A1C (%)   Date Value   04/17/2020 6.2 (H)       No results found for: POCA1C      INR (no units)   Date Value   08/03/2021 1.1   04/22/2008 1.0 (L)   02/07/2005 1.0 (L)       SODIUM (mmol/L)   Date Value   10/13/2021 137       POTASSIUM (mmol/L)   Date Value   10/13/2021 4.1           CREATININE (mg/dL)   Date Value   10/13/2021 0.6       Documented patient preferred pharmacies:    [facility], [place] - [address]  Phone: [phone] Fax: [phone]

## 2022-05-21 ENCOUNTER — Ambulatory Visit (HOSPITAL_BASED_OUTPATIENT_CLINIC_OR_DEPARTMENT_OTHER): Payer: No Typology Code available for payment source | Admitting: Occupational Therapy

## 2022-05-21 ENCOUNTER — Other Ambulatory Visit: Payer: Self-pay

## 2022-05-21 DIAGNOSIS — S52502D Unspecified fracture of the lower end of left radius, subsequent encounter for closed fracture with routine healing: Secondary | ICD-10-CM | POA: Diagnosis present

## 2022-05-21 DIAGNOSIS — S52502A Unspecified fracture of the lower end of left radius, initial encounter for closed fracture: Secondary | ICD-10-CM | POA: Diagnosis present

## 2022-05-21 NOTE — Progress Notes (Signed)
S:  "The doctor said it looks very good."  No pain per pt.    O:       05/21/22 0900   Language Information   Language of Care Portuguese   Interpreter Yes   Name/ID #[id number]   Weight Bearing Status   LUE NWB   Rehab Discipline   Rehab Discipline OT   Visit   Visit number 3   POC Due date 06/03/22   Time Calculation   Start Time 0900   Stop Time 0930   Time Calculation (min) 30 min   Pain   Pain Score 0    Ther Exercise   Exercise AROM wrist flexion   Reps 10   Sets 2   Ther Exercise 2   Exercise AROM wrist ext   Reps 2 10   Sets 2 2   Ther Exercise 3   Exercise 3 AROM sup/pro   Reps 3 10   Sets 3 2   Ther Exercise 4   Exercise 4 AROM RD/UD   Reps 4 10   Sets 4 2   Ther Exercise 5   Exercise 5 AROM L shoulder - FF, abd, ER/IR   Reps 5 10   Sets 5 1   Ther Exercise 6   Exercise 6 TGE   Reps 6 10   Sets 6 1   Ther Exercise 7   Exercise 7 opposition   Reps 7 10   Sets 7 2   Patient Education   What was taught? Review HEP, activity restrictions   Method Verbal;Demo;Practice   Patient comprehension Yes         A:  Pt now 3.5 weeks post op.  Steri strips over distal wrist incision starting to fall off.  Review with pt that she may get incision wet, but to let strips come off on their own.  Proximal skin abrasion appears to be healing.  Pt covering with band aid and applying bacitracin at home.  Review HEP for AROM exercises.  Increasing AROM since last visit, and no pain with exercises.  Wearing splint at work and reports she does not use L hand except to occasionally hold a very light object, such as a cleaning rag or sponge.  Review shoulder AROM, including ER/IR exercises.      P:  Cont OT 1x/wk for ROM, pt ed, scar/edema massage.    [name], [place], [place] # [id number]

## 2022-05-24 ENCOUNTER — Ambulatory Visit (HOSPITAL_BASED_OUTPATIENT_CLINIC_OR_DEPARTMENT_OTHER): Payer: No Typology Code available for payment source | Admitting: Family Medicine

## 2022-05-24 ENCOUNTER — Other Ambulatory Visit: Payer: Self-pay

## 2022-05-24 ENCOUNTER — Other Ambulatory Visit (HOSPITAL_BASED_OUTPATIENT_CLINIC_OR_DEPARTMENT_OTHER): Payer: Self-pay | Admitting: Family Medicine

## 2022-05-24 ENCOUNTER — Ambulatory Visit
Admission: RE | Admit: 2022-05-24 | Discharge: 2022-05-24 | Disposition: A | Discharge status: Home / Self Care | Payer: No Typology Code available for payment source | Source: Ambulatory Visit | Attending: Vascular & Interventional Radiology | Admitting: Vascular & Interventional Radiology

## 2022-05-24 VITALS — BP 125/81 | HR 93 | Temp 97.6°F | Ht 59.0 in | Wt 150.0 lb

## 2022-05-24 DIAGNOSIS — R0789 Other chest pain: Secondary | ICD-10-CM

## 2022-05-24 DIAGNOSIS — R229 Localized swelling, mass and lump, unspecified: Secondary | ICD-10-CM | POA: Insufficient documentation

## 2022-05-24 DIAGNOSIS — W19XXXA Unspecified fall, initial encounter: Secondary | ICD-10-CM | POA: Insufficient documentation

## 2022-05-24 DIAGNOSIS — R0781 Pleurodynia: Secondary | ICD-10-CM | POA: Insufficient documentation

## 2022-05-24 DIAGNOSIS — E669 Obesity, unspecified: Secondary | ICD-10-CM

## 2022-05-24 DIAGNOSIS — Z6833 Body mass index (BMI) 33.0-33.9, adult: Secondary | ICD-10-CM | POA: Insufficient documentation

## 2022-05-24 DIAGNOSIS — S2242XA Multiple fractures of ribs, left side, initial encounter for closed fracture: Secondary | ICD-10-CM | POA: Insufficient documentation

## 2022-05-24 MED ORDER — OXYCODONE HCL 5 MG PO TABS
5.00 mg | ORAL_TABLET | Freq: Three times a day (TID) | ORAL | 0 refills | Status: AC | PRN
Start: 2022-05-24 — End: 2022-05-29

## 2022-05-24 MED ORDER — LIRAGLUTIDE 18 MG/3ML SC SOPN
1.2000 mg | PEN_INJECTOR | Freq: Every day | SUBCUTANEOUS | 2 refills | Status: DC
Start: 2022-05-24 — End: 2022-08-04

## 2022-05-24 NOTE — Progress Notes (Signed)
BP 125/81   Pulse 93   Temp 97.6 F (36.4 C) (Temporal)   Ht '4\' 11"'$  (1.499 m)   Wt 68 kg (150 lb)   LMP 07/11/2005   SpO2 95%   BMI 30.30 kg/m     S:    Stephanie [name] is a 61 year old female who presents with:    Losing weight     Fell and hit side   Ribs  One week ago   Hard to lift arm and sleep     Past Medical History:  02/12/2016: Chronic right shoulder pain  No date: Depression  03/20/2012: Diverticulosis of colon      Comment:  Noted on colonoscopy 6/13   08/16/2010: Elevated hemoglobin A1c      Comment:  HEMOGLOBIN A1C (%) Date Value 03/06/2015 5.6                05/20/2014 5.4 05/08/2012 6.0 (H)   No results found for:               POCA1C   11/21/2012: Hyperopia with astigmatism and presbyopia  No date: Hypertension  11/21/2012: Immature cataract  01/21/2012: Impingement syndrome of right shoulder      Comment:  10/14: pt taking  [place] med (meloxicam 7.5 mg BID                and cyclobenzaprine); Will ask for refill once supply                finished; PCP ok with refill of flexeril on temporary                basis;   01/26/2008: Irritable bladder      Comment:  Seen by Dr Jeraldine Loots 5/09, no incontinence, likely                irritable bladder.  Trial of anticholinergic, refer to                urology, has appt Dr Minna Antis 02/23/08  02/16/2005: Leiomyoma of uterus, unspecified      Comment:  Dr Jeraldine Loots hysterectomy November, 2006; cervix retained,               last pap 11/11 neg Repeat pap/HPV 07/2015   02/15/2014: Mass of hand  No date: OSA (obstructive sleep apnea)  07/30/2011: Other plastic surgery for unacceptable cosmetic appearance      Comment:  Breast augmentation scheduled 08/02/11;  Dr.  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    ROS: Per HPI. No reported headaches, weakness/falls, chest pain, shortness of breath, abdominal pain/nausea/vomiting, numbness/tingling, fevers or chills.     Medications, allergies, PMH and SH reviewed and updated as necessary in Epic.    O:  PHYSICAL EXAM:  BP 125/81   Pulse 93   Temp 97.6 F (36.4 C) (Temporal)   Ht '4\' 11"'$  (1.499 m)   Wt 68 kg (150 lb)   LMP 07/11/2005   SpO2 95%   BMI 30.30 kg/m   GEN: NAD  HEENT:  MMM  SKIN: WWP, lesion L temple, flesh colored mostly smooth lesion - soft  CHEST: Breathing comfortably  NEURO: No focal deficits noted, alert  MSK: Normal gait observed  PSYCH: Appears stated age, appropriate grooming and dress, appropriate affect and mood congruent  RIBS: Painful to rouch R side    A/P:  Stephanie [name] is a 61 year old female presents with:    1. BMI 33.0-33.9,adult  - liraglutide (VICTOZA) 18 MG/3ML pen injector; Inject 1.2 mg under the skin in the morning. Start 0.'6mg'$  x 1 wk, then 1.'2mg'$  x 1 wk and then 1.'8mg'$  x 1 wk.  Dispense: 3 mL; Refill: 2    2. Obesity (BMI 30-39.9)  Doing well   Cont low dose  - liraglutide (VICTOZA) 18 MG/3ML pen injector; Inject 1.2 mg under the skin in the morning. Start 0.'6mg'$  x 1 wk, then 1.'2mg'$  x 1 wk and then 1.'8mg'$  x 1 wk.  Dispense: 3 mL; Refill: 2    3. Rib pain  ? Break - reviewed doesn't change management but could help with expectations   Pain medication reviewed  Had recently for surgery   In accordance with the opiate bill enacted on 11/11/14, the patient's condition is such that a non-opiate medication was not appropriate and:    1. The treatment of the condition associated with the prescribing of an opiate/opioid medication is due to addiction treatment, acute condition, chronic pain, cancer, or palliative care.    2. The patient's medical team has evaluated the patient's current condition, risk factors, history of substance  abuse, if any, and current medications.  3. The patient's medical team has informed the patient that the prescribed medication is an appropriate course of treatment based on the medical need of the patient.    4. The patient's medical team has consulted with the patient regarding the quantity of the opiate prescribed and informed the patient of his right to fill the prescription in a lesser quantity.  5. The patient's medical team has expressly informed the patient of the risks associated with the opiate prescribed.  6. If this patient is prescribed an extended-release opiate, the patient has signed a Controlled Substance Agreement with [his/her] medical team.  7. I or an authorized delegate have checked the Prescription Monitoring Program ([place]) for this patient.         - XR RIBS LEFT WITH PA & LATERAL CHEST; Future  - oxyCODONE (ROXICODONE) 5 MG immediate release tablet; Take 1 tablet by mouth every 8 (eight) hours as needed for Pain Max Daily Amount: 15 mg  for up to 5 days  Dispense: 20 tablet; Refill: 0    4. Skin mass  Reviewed lesion on temple is getting bigger  Reviewed last derm and will see about getting her connect with plastic given failed attempts  - REFERRAL TO E-CONSULTATION TELEDERMATOLOGY  - REFERRAL TO PLASTIC SURGERY (INT)      We discussed the patient's current medications. The patient expressed understanding and no barriers to adherence were identified.    F/u as needed    Health Maintenance:  Reviewed HM      Lemar Livings, MD

## 2022-05-25 ENCOUNTER — Other Ambulatory Visit (HOSPITAL_BASED_OUTPATIENT_CLINIC_OR_DEPARTMENT_OTHER): Payer: No Typology Code available for payment source | Admitting: Dermatology

## 2022-05-25 ENCOUNTER — Ambulatory Visit (HOSPITAL_BASED_OUTPATIENT_CLINIC_OR_DEPARTMENT_OTHER): Payer: Self-pay | Admitting: Internal Medicine

## 2022-05-25 DIAGNOSIS — D485 Neoplasm of uncertain behavior of skin: Secondary | ICD-10-CM | POA: Diagnosis not present

## 2022-05-25 NOTE — e-consult (Signed)
Thank you for referring your patient to me with use of the teledermatology system. I am basing my recommendations upon the information provided by the referring physician, including images, and upon review of the problem list, medications, and allergies as documented in the electronic medical record. I have not had the benefit of personally interviewing or physically examining the patient.        Consultation request:  fu prior in person derm - still hasn't managed to get in with plastics - plastics hasn't been answering phone  - lesion is growing , not painful but itchy aside from plastic any other intervention needed/ suggested      Image interpretation:  Photo from 9/25/223 reviewed  - subcutaneous, slightly red papule/ nodule on the L temple  - A few irregular brown patches on the L neck.      Assessment:  I favor this is a cyst or lipoma; however, I cannot rule out an atypical or malignant lesion without an in-person exam with dermoscopy and consideration of biopsies. Given its location in a cosmetically sensitive region I still favor that plastics remove this.     On the L neck are a few dark irregular patches that are likely lentigines (it is hard to tell as the photo is blurry here); however, I cannot rule out an atypical or malignant pigmented lesion without an in-person exam with dermoscopy and consideration of biopsies.       Recommendations:  - I would page or message plastics to see if they can arrange an appointment for her- if you are unsuccessful please let me know as then I can refer her to dermatological surgery at [facility] (they might not be willing to take a suspected benign lesion so we should exhaust possible options at [facility] first).   - In the meantime, please instruct the patient to monitor the brown patches on the L neck for any growth, changes, or development of associated symptoms- if any occur please let us know immediately. I can also f/u on these lesions when I next see her in person.            Time spent on this e-Consultation :  >25mn     Please let me know whether I can be of further assistance and please let me know how the patient responds to your management.  Sincerely,    CHarolyn Rutherford MD  [facility]

## 2022-05-26 ENCOUNTER — Encounter (HOSPITAL_BASED_OUTPATIENT_CLINIC_OR_DEPARTMENT_OTHER): Payer: Self-pay | Admitting: Family Medicine

## 2022-05-28 ENCOUNTER — Other Ambulatory Visit: Payer: Self-pay

## 2022-05-28 ENCOUNTER — Ambulatory Visit: Payer: No Typology Code available for payment source | Attending: Occupational Therapy | Admitting: Occupational Therapy

## 2022-05-28 DIAGNOSIS — S52502D Unspecified fracture of the lower end of left radius, subsequent encounter for closed fracture with routine healing: Secondary | ICD-10-CM

## 2022-05-28 DIAGNOSIS — S52502A Unspecified fracture of the lower end of left radius, initial encounter for closed fracture: Secondary | ICD-10-CM | POA: Diagnosis present

## 2022-05-31 NOTE — Progress Notes (Signed)
S:  "I always wear the splint when I am working."  No pain per pt, aside from discomfort at thumb hole of splint."      O:       05/28/22 1100   Language Information   Language of Care [place] Yes   Name/ID Eligha Bridegroom #[id number]   Weight Bearing Status   LUE NWB   Hawkins OT   Visit   Visit number 4   POC Due date next visit   Time Calculation   Start Time 1100   Stop Time 1130   Time Calculation (min) 30 min   Pain   Pain Score 0    Ther Exercise   Exercise AROM wrist flexion   Reps 10   Sets 2   Ther Exercise 2   Exercise AROM wrist ext   Reps 2 10   Sets 2 2   Ther Exercise 3   Exercise 3 AROM sup/pro   Reps 3 10   Sets 3 2   Ther Exercise 4   Exercise 4 AROM RD/UD   Reps 4 10   Sets 4 2   Ther Exercise 5   Exercise 5 AROM L shoulder - FF, abd, ER/IR   Reps 5 10   Sets 5 1   Ther Exercise 6   Exercise 6 TGE   Reps 6 10   Sets 6 1   Ther Exercise 7   Exercise 7 opposition   Reps 7 10   Sets 7 2   Other   Splinting/Bracing adjust splint   Patient Education   What was taught? Review HEP, activity restrictions   Method Verbal;Demo;Practice   Patient comprehension Yes       A:  Pt now 4 wks post ORIF L distal radius fx.  Doing well with HEP and improving ROM.  Advanced to AAROM exercises for wrist flex/ext.  No pain per pt, except she does c/o discomfort at thumb hole of splint, which is adjusted today.  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  Abrasion proximal forearm has healed over and steri strips off of volar incision.  Instructed in scar/edema massage techniques.    P:  Cont OT 1x/wk for ROM, scar/edema massage, pt ed          [name], [place], Lic # [id number]

## 2022-06-04 ENCOUNTER — Other Ambulatory Visit: Payer: Self-pay

## 2022-06-04 ENCOUNTER — Ambulatory Visit (HOSPITAL_BASED_OUTPATIENT_CLINIC_OR_DEPARTMENT_OTHER): Payer: No Typology Code available for payment source | Admitting: Occupational Therapy

## 2022-06-04 DIAGNOSIS — S52502A Unspecified fracture of the lower end of left radius, initial encounter for closed fracture: Secondary | ICD-10-CM | POA: Insufficient documentation

## 2022-06-04 DIAGNOSIS — S52502D Unspecified fracture of the lower end of left radius, subsequent encounter for closed fracture with routine healing: Secondary | ICD-10-CM | POA: Diagnosis present
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
## 2022-06-09 NOTE — Progress Notes (Signed)
I certify that the documented Treatment Plan is reasonable and necessary.    06/09/2022  [name], PA-C

## 2022-06-11 ENCOUNTER — Ambulatory Visit: Payer: No Typology Code available for payment source | Attending: Physician Assistant | Admitting: Occupational Therapy

## 2022-06-11 ENCOUNTER — Other Ambulatory Visit: Payer: Self-pay

## 2022-06-11 DIAGNOSIS — S52502D Unspecified fracture of the lower end of left radius, subsequent encounter for closed fracture with routine healing: Secondary | ICD-10-CM

## 2022-06-11 DIAGNOSIS — S52502A Unspecified fracture of the lower end of left radius, initial encounter for closed fracture: Secondary | ICD-10-CM | POA: Diagnosis not present

## 2022-06-11 NOTE — Progress Notes (Signed)
S:  "I feel very good."  Pain 0/10 per pt.    O:       06/11/22 1430   Language Information   Language of Care [place]   Interpreter Yes   Weight Bearing Status   LUE NWB   Rehab Discipline   Rehab Discipline OT   Visit   Visit number 6   POC Due date 07/05/22   Time Calculation   Start Time 1430   Stop Time 1500   Time Calculation (min) 30 min   Pain   Pain Score 0    Ther Exercise   Exercise AROM wrist flexion   Reps 10   Sets 2   Ther Exercise 2   Exercise AROM wrist ext   Reps 2 10   Sets 2 2   Ther Exercise 3   Exercise 3 AROM sup/pro   Reps 3 10   Sets 3 2   Ther Exercise 4   Exercise 4 AROM RD/UD   Reps 4 10   Sets 4 2   Ther Exercise 5   Exercise 5 AROM L shoulder - FF, abd, ER/IR   Reps 5 10   Sets 5 1   Ther Exercise 6   Exercise 6 TGE   Reps 6 10   Sets 6 1   Ther Exercise 7   Exercise 7 opposition   Reps 7 10   Sets 7 2   Patient Education   What was taught? Review HEP, activity restrictions   Method Verbal;Demo;Practice   Patient comprehension Yes         A:  Pt now 7 wks post ORIF L wrist.  Good AROM L wrist.  Ext this visit 45 AROM and 50 PROM.  R wrist noted to be 55 degrees AROM.  Using volar wrist orthosis at work.  Good performance HEP.  Minimal edema.  Full digital AROM.  Suture end spitting at distal incision - pt keeping clean with soap and water.  Will have MD address at ortho f/u 06/18/22.    P:  Cont OT after ortho for progression to strengthening phase of program.      [name], OT, Lic # [id number]

## 2022-06-15 ENCOUNTER — Other Ambulatory Visit (HOSPITAL_BASED_OUTPATIENT_CLINIC_OR_DEPARTMENT_OTHER): Payer: Self-pay | Admitting: Family Medicine

## 2022-06-15 DIAGNOSIS — E669 Obesity, unspecified: Secondary | ICD-10-CM

## 2022-06-15 DIAGNOSIS — Z6833 Body mass index (BMI) 33.0-33.9, adult: Secondary | ICD-10-CM

## 2022-06-15 NOTE — Telephone Encounter (Signed)
Person calling on behalf of patient: Patient (self)    Stephanie [name] is a 61 year old female who is a patient requesting refills for medication(s)      - medication(s) request: Pen needles  - last office visit: 05/24/2022  - last physical exam: 02/21/2013      Diabetic Med:   HEMOGLOBIN A1C (%)   Date Value   04/17/2020 6.2 (H)       No results found for: POCA1C   CREATININE (mg/dL)   Date Value   10/13/2021 0.6           Documented patient preferred pharmacies:    [place], [place]  Phone: [phone] Fax: [phone]

## 2022-06-16 MED ORDER — INSULIN PEN NEEDLE 31G X 8 MM MISC
3 refills | Status: AC
Start: 2022-06-16 — End: 2023-06-16

## 2022-06-18 ENCOUNTER — Ambulatory Visit
Admission: RE | Admit: 2022-06-18 | Discharge: 2022-06-18 | Disposition: A | Discharge status: Home / Self Care | Payer: No Typology Code available for payment source | Attending: Radiology | Admitting: Radiology

## 2022-06-18 ENCOUNTER — Ambulatory Visit (HOSPITAL_BASED_OUTPATIENT_CLINIC_OR_DEPARTMENT_OTHER): Payer: No Typology Code available for payment source | Admitting: Physician Assistant

## 2022-06-18 ENCOUNTER — Other Ambulatory Visit: Payer: Self-pay

## 2022-06-18 ENCOUNTER — Ambulatory Visit (HOSPITAL_BASED_OUTPATIENT_CLINIC_OR_DEPARTMENT_OTHER): Payer: No Typology Code available for payment source | Admitting: Orthopaedic Surgery

## 2022-06-18 VITALS — Wt 152.0 lb

## 2022-06-18 DIAGNOSIS — Z96652 Presence of left artificial knee joint: Secondary | ICD-10-CM | POA: Insufficient documentation

## 2022-06-18 DIAGNOSIS — Z4789 Encounter for other orthopedic aftercare: Secondary | ICD-10-CM | POA: Insufficient documentation

## 2022-06-18 DIAGNOSIS — M1711 Unilateral primary osteoarthritis, right knee: Secondary | ICD-10-CM | POA: Diagnosis present

## 2022-06-18 DIAGNOSIS — S52502D Unspecified fracture of the lower end of left radius, subsequent encounter for closed fracture with routine healing: Secondary | ICD-10-CM | POA: Insufficient documentation

## 2022-06-18 MED ORDER — IBUPROFEN 800 MG PO TABS
800.00 mg | ORAL_TABLET | Freq: Three times a day (TID) | ORAL | 0 refills | Status: AC | PRN
Start: 2022-06-18 — End: 2022-07-18

## 2022-06-18 NOTE — Progress Notes (Signed)
Orthopedic Office Note    CC: Patient presents with:  Follow Up: Left wrist and left knee      ORTHOPEDIC PROBLEM LIST:  1. S/p left distal radius ORIF DOS: 04/18/2022  2. S/p L TKR, DOS 10/12/21  3. Right knee OA    HPI: Stephanie [name] is a 61 year old female who presents for follow-up for her left wrist. Patient is 2 months out from surgery. She is doing very well. She has no pain in the wrist and has good movement. 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  Denies numbness or tingling distally.    She reports stiffness and mild discomfort in the left knee. She is 8 months out from surgery. Overall she is very happy with the knee.    IMAGING: x-ray of the left wrist today 06/18/22 reveals healed left distal radius fracture with orthopedic hardware in place.  Xray of the left knee 11/25/21 reveals prosthesis in place without hardware complication.  MRI of the left knee 03/16/22 is obscured by metal artifact. Post-surgical changes.  Imaging was personally reviewed during this visit.     PHYSICAL EXAM:  GENERAL: Alert and oriented, in no acute distress  MOOD: Appropriate.   MUSCULOSKELETAL: Left wrist with well healed volar incision.  No drainage or erythema. Retained suture. No swelling of the hand and wrist.  Nontender to palpation.  She can make a fist.  She has about 30 degrees of extension and flexion of the wrist.  Neurovascularly intact distally.  Left knee with midline anterior incisional scar. No swelling. No erythema. NTTP. ROM 0-120 degrees. Stable to varus and valgus stress. Calf soft, nontender. NVI distally.    ASSESSMENT/PLAN: 61 year old female status post left distal radius ORIF. Patient is 2 months out from surgery.  She is doing well overall.  She has no pain.  Continue activities as tolerated.    S/p left TKA: knee looks great. Her motion is excellent. I encouraged her that it is not concerning to have intermittent stiffness at this stage. Continue stretching and strengthening  HEP.    Follow up in 3 months.    We reviewed the likely diagnosis, the prognosis, and various treatment options in detail. Risks and benefits of treatment plan discussed. The patient's questions have been answered, and the patient understands agrees with treatment plan.       Stephanie Modena, PA-C, 06/18/2022      Nursing Communication:    __X_ [name]: Left knee  ___ [name] in splint/cast  ___ XOA out of splint/cast  ___ Cast removal   ___ MRI/CT review  ___ Surgical booking (vitals)  ___ None

## 2022-06-25 ENCOUNTER — Ambulatory Visit (HOSPITAL_BASED_OUTPATIENT_CLINIC_OR_DEPARTMENT_OTHER): Payer: No Typology Code available for payment source | Admitting: Occupational Therapy

## 2022-07-04 ENCOUNTER — Other Ambulatory Visit (HOSPITAL_BASED_OUTPATIENT_CLINIC_OR_DEPARTMENT_OTHER): Payer: Self-pay | Admitting: Family Medicine

## 2022-07-04 NOTE — Telephone Encounter (Signed)
PER Pharmacy, Stephanie [name] is a 61 year old female has requested a refill of-  cymbalta       Last Office Visit: 05/24/22 with pcp  Last Physical Exam: 02/21/13     There are no preventive care reminders to display for this patient.     Other Med Adult:  Most Recent BP Reading(s)  05/24/22 : 125/81        Cholesterol (mg/dL)   Date Value   11/30/2021 237     LOW DENSITY LIPOPROTEIN DIRECT (mg/dL)   Date Value   11/30/2021 135     HIGH DENSITY LIPOPROTEIN (mg/dL)   Date Value   11/30/2021 49     TRIGLYCERIDES (mg/dL)   Date Value   11/30/2021 254 (H)         THYROID SCREEN TSH REFLEX FT4 (uIU/mL)   Date Value   08/03/2021 1.460         TSH (THYROID STIM HORMONE) (uIU/mL)   Date Value   12/10/2016 0.790       HEMOGLOBIN A1C (%)   Date Value   04/17/2020 6.2 (H)       No results found for: POCA1C      INR (no units)   Date Value   08/03/2021 1.1   04/22/2008 1.0 (L)   02/07/2005 1.0 (L)       SODIUM (mmol/L)   Date Value   10/13/2021 137       POTASSIUM (mmol/L)   Date Value   10/13/2021 4.1           CREATININE (mg/dL)   Date Value   10/13/2021 0.6        Documented patient preferred pharmacies:    [facility], [place]  Phone: [phone] Fax: [phone]

## 2022-08-04 ENCOUNTER — Ambulatory Visit: Payer: No Typology Code available for payment source | Attending: Family Medicine | Admitting: Family Medicine

## 2022-08-04 ENCOUNTER — Encounter (HOSPITAL_BASED_OUTPATIENT_CLINIC_OR_DEPARTMENT_OTHER): Payer: Self-pay | Admitting: Family Medicine

## 2022-08-04 ENCOUNTER — Other Ambulatory Visit (HOSPITAL_BASED_OUTPATIENT_CLINIC_OR_DEPARTMENT_OTHER): Payer: Self-pay | Admitting: Family Medicine

## 2022-08-04 ENCOUNTER — Other Ambulatory Visit: Payer: Self-pay

## 2022-08-04 VITALS — BP 122/81 | HR 80 | Temp 96.7°F | Wt 158.3 lb

## 2022-08-04 DIAGNOSIS — N644 Mastodynia: Secondary | ICD-10-CM | POA: Insufficient documentation

## 2022-08-04 DIAGNOSIS — E669 Obesity, unspecified: Secondary | ICD-10-CM | POA: Diagnosis present

## 2022-08-04 DIAGNOSIS — L659 Nonscarring hair loss, unspecified: Secondary | ICD-10-CM | POA: Diagnosis present

## 2022-08-04 DIAGNOSIS — Z23 Encounter for immunization: Secondary | ICD-10-CM | POA: Diagnosis present

## 2022-08-04 DIAGNOSIS — Z6833 Body mass index (BMI) 33.0-33.9, adult: Secondary | ICD-10-CM | POA: Diagnosis present

## 2022-08-04 LAB — CBC WITH PLATELET
ABSOLUTE NRBC COUNT: 0 10*3/uL (ref 0.0–0.0)
HEMATOCRIT: 37.4 % (ref 34.1–44.9)
HEMOGLOBIN: 12.4 g/dL (ref 11.2–15.7)
MEAN CORP HGB CONC: 33.2 g/dL (ref 31.0–37.0)
MEAN CORPUSCULAR HGB: 31.6 pg (ref 26.0–34.0)
MEAN CORPUSCULAR VOL: 95.4 fl (ref 80.0–100.0)
MEAN PLATELET VOLUME: 11.7 fL (ref 8.7–12.5)
NRBC %: 0 % (ref 0.0–0.0)
PLATELET COUNT: 274 10*3/uL (ref 150–400)
RBC DISTRIBUTION WIDTH STD DEV: 47.8 fL — ABNORMAL HIGH (ref 35.1–46.3)
RED BLOOD CELL COUNT: 3.92 M/uL (ref 3.90–5.20)
WHITE BLOOD CELL COUNT: 7.1 10*3/uL (ref 4.0–11.0)

## 2022-08-04 LAB — IRON: IRON: 84 ug/dL (ref 50–170)

## 2022-08-04 LAB — THYROID SCREEN TSH REFLEX FT4: THYROID SCREEN TSH REFLEX FT4: 1.2 u[IU]/mL (ref 0.270–4.200)

## 2022-08-04 MED ORDER — CELECOXIB 50 MG PO CAPS
50.0000 mg | ORAL_CAPSULE | Freq: Every day | ORAL | 3 refills | Status: DC | PRN
Start: 2022-08-04 — End: 2022-11-12

## 2022-08-04 MED ORDER — LIRAGLUTIDE 18 MG/3ML SC SOPN
1.2000 mg | PEN_INJECTOR | Freq: Every day | SUBCUTANEOUS | 2 refills | Status: DC
Start: 2022-08-04 — End: 2022-10-16

## 2022-08-04 MED ORDER — CARISOPRODOL 250 MG PO TABS
125.00 mg | ORAL_TABLET | Freq: Every day | ORAL | 0 refills | Status: AC | PRN
Start: 2022-08-04 — End: 2022-11-02

## 2022-08-04 MED ORDER — DULOXETINE HCL 60 MG PO CPEP
60.0000 mg | ORAL_CAPSULE | Freq: Every day | ORAL | 3 refills | Status: DC
Start: 2022-08-04 — End: 2023-08-24

## 2022-08-04 NOTE — Patient Instructions (Signed)
Audiology [phone]  Physiatry [phone]   Breast Center [phone]  Plastic Surgery [phone]   Cardiology [phone]  Podiatry [phone]   Dermatology [phone] option 3  Pulmonology [phone]   Endocrinology [phone]  Rheumatology [phone]   ENT/Allergy [phone]  Sports Medicine [phone]   Gastroenterology [phone]  TB Clinic [phone]   Infectious Diseases [phone]  Thoracic Surgery [phone]   Nephrology [phone]  Travel Clinic (18+) [phone]   Neurology/EMG [phone]  Urology [phone]   Occupational Health [phone]  Vascular Surgery [phone]   Orthopedics [phone]  [place] (HIV) [phone]          [facility] Center (Ophthalmology/Optometry)  Women's Health (Gyn/OB/Midwives)   [place] [phone]  [place] [phone]   [place] [phone]  [place] [phone]      [place] [phone]          Radiology [phone]  Cards/Pulm Testing [phone]           PT/OT/Speech     Assembly  [phone]     [place]       [place]

## 2022-08-04 NOTE — Progress Notes (Signed)
BP 122/81   Pulse 80   Temp 96.7 F (35.9 C) (Temporal)   Wt 71.8 kg (158 lb 4.6 oz)   LMP 07/11/2005   SpO2 98%   BMI 31.97 kg/m     S:    Stephanie [name] is a 61 year old female who presents with:    Breast pain   After got picked up during a hug with her nephew   After surgery has been losing hair    Using meds from [place] brings in       Past Medical History:  02/12/2016: Chronic right shoulder pain  No date: Depression  03/20/2012: Diverticulosis of colon      Comment:  Noted on colonoscopy 6/13   08/16/2010: Elevated hemoglobin A1c      Comment:  HEMOGLOBIN A1C (%) Date Value 03/06/2015 5.6                05/20/2014 5.4 05/08/2012 6.0 (H)   No results found for:               POCA1C   11/21/2012: Hyperopia with astigmatism and presbyopia  No date: Hypertension  11/21/2012: Immature cataract  01/21/2012: Impingement syndrome of right shoulder      Comment:  10/14: pt taking  [place] med (meloxicam 7.5 mg BID                and cyclobenzaprine); Will ask for refill once supply                finished; PCP ok with refill of flexeril on temporary                basis;   01/26/2008: Irritable bladder      Comment:  Seen by Dr Jeraldine Loots 5/09, no incontinence, likely                irritable bladder.  Trial of anticholinergic, refer to                urology, has appt Dr Minna Antis 02/23/08  02/16/2005: Leiomyoma of uterus, unspecified      Comment:  Dr Jeraldine Loots hysterectomy November, 2006; cervix retained,               last pap 11/11 neg Repeat pap/HPV 07/2015   02/15/2014: Mass of hand  No date: OSA (obstructive sleep apnea)  07/30/2011: Other plastic surgery for unacceptable cosmetic appearance      Comment:  Breast augmentation scheduled 08/02/11;  Dr.  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    ROS: Per HPI. No reported headaches, weakness/falls, chest pain, shortness of breath, abdominal pain/nausea/vomiting, numbness/tingling, fevers or chills.     Medications, allergies, PMH and SH reviewed and updated as necessary in Epic.    O:  PHYSICAL EXAM:  BP 122/81   Pulse 80   Temp 96.7 F (35.9 C) (Temporal)   Wt 71.8 kg (158 lb 4.6 oz)   LMP 07/11/2005   SpO2 98%   BMI 31.97 kg/m   GEN: NAD  HEENT: MMM  SKIN: WWP  BREASTS: Equal size, postsurgical deformities bl, no puckering on inspection with arms at sides and above head. Nipples without retraction or inversion. No abnormalities palpated using vertical strip method aside from tenderness especially of RLQ R breast. No axillary LAD.   CHEST: Breathing comfortably  NEURO: No focal deficits noted, alert  MSK: Normal gait observed  PSYCH: Appears stated age, appropriate grooming and dress, appropriate affect and mood congruent    A/P:  Stephanie [name] is a 61 year old female presents with:    1. Need for prophylactic vaccination and inoculation against influenza  - IMMUNIZATION ADMIN SINGLE  - IIV4 VACC PRESERV FREE AGE 29 MONTHS AND OLDER, 0.5ML, IM    2. BMI 33.0-33.9,adult  Stable doing well  - liraglutide (VICTOZA) 18 MG/3ML sc pen-injector; Inject 1.2 mg under the skin in the morning.  Dispense: 6 mL; Refill: 2    3. Obesity (BMI 30-39.9)  - liraglutide (VICTOZA) 18 MG/3ML sc pen-injector; Inject 1.2 mg under the skin in the morning.  Dispense: 6 mL; Refill: 2    4. Breast pain  Reviewed likely MSK pain given location   Still needs fu to make sure resolving   Fu 2 weeks as will require mammo and would be very tender right now   Reviewed warning signs  - REFERRAL TO [place] [facility] (INT)  - [place] US BREAST-AXILLA RIGHT; Future    5. Hair loss  Unclear - whole head thinning  - CBC WITH PLATELET  -  IRON  - THYROID SCREEN TSH REFLEX FT4    6. Need for prophylactic vaccination/inoculation against viral disease  - RSV VACCINE (ABRYSVO)      We discussed the patient's current medications. The patient expressed understanding and no barriers to adherence were identified.    F/u as needed    Health Maintenance:  Reviewed HM      Lemar Livings, MD

## 2022-08-04 NOTE — Prior Authorization (Signed)
Patient Insurance: [facility]  Member ID: [id number]  [place]: [id number]  PCN: [id number]  Group: [id number]  Prior Authorization for: Carisoprodol '250mg'$ 

## 2022-08-04 NOTE — Prior Authorization (Signed)
Patient Insurance: [facility]  Member ID: [id number]  [place]: [id number]  PCN: [id number]  Group: [id number]  Prior Authorization for: Carisoprodol '250mg'$ 

## 2022-08-04 NOTE — Progress Notes (Signed)
Influenza Vaccine Procedure  August 04, 2022    1. Has the patient received the information for the influenza vaccine?  Yes    2. Does the patient have any of the following contraindications?  Allergy to eggs? No  Allergic reaction to previous influenza vaccines? No  Any other problems to previous influenza vaccines? No  Paralyzed by Guillain-Barre syndrome?  No  Currently pregnant? No  Current moderate or severe illness? No  Allergy to contact lens solution? No    Immunization information and current VIS for flu vaccine(s) reviewed; verbal consent given by patient/guardian.    08/04/2022  VIS given prior to administration and reviewed with the patient and or legal guardian. Patient understands the disease and the vaccine. See immunization/Injection module or chart review for date of publication and additional information.  Pennelope Bracken, RN

## 2022-08-06 NOTE — Prior Authorization (Signed)
No Go or Cancelled:    No Go or Canceled Prescription: Other  Other - Comment: Please see PA criteria for carisoprodol and let central refill know how you would like to proceed.      Documentation of all of the following is required:   medical records documenting an inadequate response, adverse reaction, or contraindication to all other centrally acting skeletal muscle relaxants; and   member is 61 years of age; and   one of the following:   the request is for an acute condition; or   clinical rationale for the use of carisoprodol for the treatment of a chronic condition.

## 2022-08-18 ENCOUNTER — Encounter (HOSPITAL_BASED_OUTPATIENT_CLINIC_OR_DEPARTMENT_OTHER): Payer: Self-pay

## 2022-09-02 ENCOUNTER — Other Ambulatory Visit (HOSPITAL_BASED_OUTPATIENT_CLINIC_OR_DEPARTMENT_OTHER): Payer: Self-pay | Admitting: Family Medicine

## 2022-09-02 DIAGNOSIS — F5104 Psychophysiologic insomnia: Secondary | ICD-10-CM

## 2022-09-02 NOTE — Telephone Encounter (Signed)
PER Pharmacy, Stephanie [name] is a 62 year old female has requested a refill of traZODone (DESYREL) 50 MG tablet           Documented patient preferred pharmacies:    [facility], [place]  Phone: [phone] Fax: [phone]

## 2022-09-03 ENCOUNTER — Other Ambulatory Visit (HOSPITAL_BASED_OUTPATIENT_CLINIC_OR_DEPARTMENT_OTHER): Payer: No Typology Code available for payment source

## 2022-09-03 ENCOUNTER — Ambulatory Visit (HOSPITAL_BASED_OUTPATIENT_CLINIC_OR_DEPARTMENT_OTHER): Payer: No Typology Code available for payment source

## 2022-09-24 ENCOUNTER — Other Ambulatory Visit: Payer: Self-pay

## 2022-09-24 ENCOUNTER — Ambulatory Visit (HOSPITAL_BASED_OUTPATIENT_CLINIC_OR_DEPARTMENT_OTHER): Payer: No Typology Code available for payment source | Admitting: Orthopaedic Surgery

## 2022-09-24 ENCOUNTER — Ambulatory Visit
Admission: RE | Admit: 2022-09-24 | Discharge: 2022-09-24 | Disposition: A | Discharge status: Home / Self Care | Payer: No Typology Code available for payment source | Attending: Internal Medicine | Admitting: Internal Medicine

## 2022-09-24 ENCOUNTER — Ambulatory Visit (HOSPITAL_BASED_OUTPATIENT_CLINIC_OR_DEPARTMENT_OTHER)
Admit: 2022-09-24 | Discharge: 2022-09-24 | Disposition: A | Discharge status: Home / Self Care | Payer: No Typology Code available for payment source

## 2022-09-24 DIAGNOSIS — M1712 Unilateral primary osteoarthritis, left knee: Secondary | ICD-10-CM | POA: Diagnosis not present

## 2022-09-24 DIAGNOSIS — Z471 Aftercare following joint replacement surgery: Secondary | ICD-10-CM | POA: Insufficient documentation

## 2022-09-24 DIAGNOSIS — M1711 Unilateral primary osteoarthritis, right knee: Secondary | ICD-10-CM | POA: Insufficient documentation

## 2022-09-24 DIAGNOSIS — S52502A Unspecified fracture of the lower end of left radius, initial encounter for closed fracture: Secondary | ICD-10-CM | POA: Insufficient documentation

## 2022-09-24 DIAGNOSIS — Z96652 Presence of left artificial knee joint: Secondary | ICD-10-CM

## 2022-09-24 NOTE — Progress Notes (Addendum)
Orthopedic Office Note    CC: No chief complaint on file.      ORTHOPEDIC PROBLEM LIST:  1. S/p left distal radius ORIF DOS: 04/18/2022  2. S/p L TKR, DOS 10/12/21  3. Right knee OA    HPI: Stephanie [name] is a 62 year old female who presents for follow-up for her left wrist and left knee.  Patient states that her left knee is doing very well.  She is now 11 months postop.  She has no pain.  She will have occasional numbness on the lateral aspect of the knee.  Overall she states she is doing very well.  She does not need to use a walker or cane anymore.    In regards to her left wrist, she is now 5 months out from surgery.  She states it was still get sore after she does a lot of working.  She works as a Scientist, product/process development.  She last went to therapy at the end of October.  She wears a brace for comfort during work.  Overall she feels she is doing well.    IMAGING: x-ray of the left wrist from 06/18/22 reveals healed left distal radius fracture with orthopedic hardware in place.  Xray of the left knee today reveals prosthesis in place without hardware complication.  On standing films, pt has significant right knee OA.  Imaging was personally reviewed by myself and Dr. Madelaine Etienne during this visit.     PHYSICAL EXAM:  GENERAL: Alert and oriented, in no acute distress  MOOD: Appropriate.   MUSCULOSKELETAL: Left wrist with well healed volar incision.  No drainage or erythema. No swelling of the hand and wrist.  Nontender to palpation over the distal radius.  She can make a fist.  Good flexion and extension of the wrist without pain.  Neurovascularly intact distally.    Left knee with midline anterior incisional scar. No swelling. No erythema.  Mildly tender to palpation over the medial aspect of the knee.  Nontender to palpation over the lateral aspect of the knee.  ROM 0-120 degrees. Stable to varus and valgus stress. Calf soft, nontender. NVI distally.    ASSESSMENT/PLAN: 62 year old female who presents today for follow-up of her left  knee replacement and left distal radius ORIF.  In regards to her knee, she is 11 months out from surgery.  She is doing excellent postoperatively.  She has returned to her prior level of function and does not require any assistive aids.  She is interested in doing her right side at the end of the year.      In regards to the wrist, this is also going very well.  She is 5 months out from surgery at this time.  She can continue wearing the brace for comfort while she is working.  She can continue stretching to help work on her range of motion as well.  We did order a bone density screen today.  Will review the results of this at her next visit.    See her back in 3 months to see how she is doing for both of these surgeries and to review the bone density test.  At that time we can begin discussing total joint replacement for the right knee.    We reviewed the likely diagnosis, the prognosis, and various treatment options in detail. Risks and benefits of treatment plan discussed. The patient's questions have been answered, and the patient understands agrees with treatment plan.  Catalina Lunger, PA-C, 09/24/2022    Patient seen and examined with Catalina Lunger, PA-C.  This note was written by Dariush Mcnellis and edited by me so that it reflects my findings, assessment and plan. I agree with the note. I spent more than 50% of this joint visit with the patient and did the substantive part of the visit and decision making.    Stephanie [name] is doing really well after her left knee replacement and after ORIF of her left wrist.  She still is a little stiff in the wrist and I encouraged her to keep moving her wrist.  She states she is able to work and do everything she needs to do fine.    She does have osteoarthritis of her right knee.  She is thinking of having it replaced at the end of the year.    Patient did have a fragility fracture of her wrist.  She takes vitamin D.  It is reasonable to get a bone density test.  I ordered this and we will see  her back in 3 months to review the results and discuss options for her right knee.        Deeann Saint, MD, 09/24/2022      Nursing Communication:       None

## 2022-09-27 ENCOUNTER — Ambulatory Visit: Payer: No Typology Code available for payment source | Attending: Dermatology | Admitting: Dermatology

## 2022-09-27 ENCOUNTER — Other Ambulatory Visit: Payer: Self-pay

## 2022-09-27 DIAGNOSIS — D229 Melanocytic nevi, unspecified: Secondary | ICD-10-CM | POA: Diagnosis present

## 2022-09-27 DIAGNOSIS — D2210 Melanocytic nevi of unspecified eyelid, including canthus: Secondary | ICD-10-CM | POA: Diagnosis present

## 2022-09-27 DIAGNOSIS — D485 Neoplasm of uncertain behavior of skin: Secondary | ICD-10-CM | POA: Diagnosis not present

## 2022-09-27 NOTE — Progress Notes (Signed)
[facility]  [address] [place], [place] [address]    Dermatology Clinic Note    CC: spot checks.   ?  HPI: Stephanie [name] is a 62 year old female     Lesion on the L temple, present for a few weeks. Bothers her, Has an appt with plastics in Feb.    Here also for spot checks.   ?  ROS: negative, no other skin complaints.  Feels well, no systemic complaints.   ?  Past Dermatologic hx:  ?  PMH:  Patient Active Problem List:     BMI 33.0-33.9,adult     Pure hypercholesterolemia     Family history of malignant neoplasm of gastrointestinal tract     Tubular adenoma of colon     Hyperopia with astigmatism and presbyopia     Immature cataract     Hypertension, goal below 140/90     Adenomatous polyp of colon     Squamous 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 eyes     Alcohol abuse     Depression, recurrent (HCC)     Trauma and stressor-related disorder     Nontraumatic complete tear of right rotator cuff     Localized osteoarthritis of left knee     Obesity (BMI 30-39.9)     Chronic pain of left knee     Primary osteoarthritis of left knee     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  Meds:  ?     Medication List            Accurate as of December 21, 2021  2:03 PM. If you have any questions, ask your nurse or doctor.                CONTINUE taking these medications      dexamethasone 4 MG tablet  Commonly known as: DECADRON     Fish Oil 1000 MG Caps     Glucosamine 750 MG Tabs     Insulin Pen Needle 31G X 8 MM Misc  Inject under the skin     liraglutide 18 MG/3ML pen injector  Commonly known as: VICTOZA  Start 0.'6mg'$  x 1 wk, then 1.'2mg'$  x 1 wk and then 1.'8mg'$  x 1 wk     losartan 25 MG tablet  Commonly known as: COZAAR  Take 1 tablet by mouth in the morning.     omeprazole 40 MG capsule  Commonly known as: PriLOSEC  Take 1 capsule by mouth in the morning.     traZODone 50 MG tablet  Commonly known as: DESYREL  TAKE 1 TABLET BY MOUTH NIGHTLY     Turmeric  500 MG Caps            ?  All:  Review of Patient's Allergies indicates:  No Known Allergies?     SHx:  ?Social History    Tobacco Use      Smoking status: Former        Types: Cigarettes        Quit date: 07/27/2001        Years since quitting: 21.1        Passive exposure: Never      Smokeless tobacco: Never    Alcohol use: Yes      Alcohol/week: 6.0 standard drinks of alcohol      Types: 6 Cans of beer per  week      Comment: 6 beers per week     Drug use: No       FHx:  Hx of melanoma - , hx of NMSC -  ?  Physical exam/Assessment/Plan:  Well appearing pt in NAD  Mood and affect are wnl  A skin examination was performed including face, Left forearm.   Skin type: III    # Neoplasm of uncertain behavior   - Left temple with ~1cm subcutaneous nodule, no epidermal changes, favor cyst or lipoma  - Has appt with plastics on 10/08/2022 for removal.     # Favor compound nevus  - Right lower lateral lid with 5x4 mm brown papule with non arborizing telangs. DDX also includes pigmented BCC  - unchanged, cont to monitor  - She thinks it is larger, I will see her sooner, in 49mo     # Benign nevi, scattered on trunk and extremities: benign appearing brown macules and papules, most <543mwith no concerning features on exam or dermoscopy.   - Skin self-examination reviewed; ABCD's discussed.  - Sun protection and avoidance reviewed, including proper use of broad-spectrum UVB/UVA sunscreens with SPF 30 or greater, re-application after swimming and q 3-4 hours emphasized.?  Monitor:   - Left arm 10x1219mrregular dark patch, but reassuring pattern, favor lentigo, but appears darker today, will biopsy.     # Neoplasm of uncertain behavior   - left arm a 10x12m22mrk irregular patch, r/o atypical nevus.   Shave biopsy:   SHAVE BIOPSY PROCEDURE NOTE  -informed consent obtained, time out performed  -lesion was anesthetized with Lidocaine 2% w/ epi  -shave biopsy performed in the following location(s): L arm  -hemostasis achieved using  Aluminum Chloride  -wound dressed using vaseline and a bandaid  -wound care instructions were given  -the specimen will be sent to pathology and the patient will be notified of the results    PATIENT/PROCEDURE VERIFICATION DOCUMENTATION    Correct patient: Yes  Correct procedure: Yes  Correct side, site, mark visible if applicable: Yes  Correct position: Yes  Special equipment/implant(s) present, if applicable: NA    Time-out completed, documented by provider doing procedure or designated team member:  ChaoKelvin Cellar              RTC: 28mo 52mo checks, in 76mo [id number]  ?  ?  ?  Advaith Lamarque YHarolyn RutherfordCHA [facility]         CC: [id number]

## 2022-10-04 ENCOUNTER — Other Ambulatory Visit: Payer: Self-pay

## 2022-10-04 ENCOUNTER — Ambulatory Visit: Payer: No Typology Code available for payment source | Attending: Family Medicine | Admitting: Family Medicine

## 2022-10-04 ENCOUNTER — Encounter (HOSPITAL_BASED_OUTPATIENT_CLINIC_OR_DEPARTMENT_OTHER): Payer: Self-pay | Admitting: Family Medicine

## 2022-10-04 VITALS — BP 124/77 | HR 89 | Temp 98.2°F | Ht 59.0 in | Wt 156.0 lb

## 2022-10-04 DIAGNOSIS — I10 Essential (primary) hypertension: Secondary | ICD-10-CM | POA: Diagnosis not present

## 2022-10-04 DIAGNOSIS — G8929 Other chronic pain: Secondary | ICD-10-CM | POA: Insufficient documentation

## 2022-10-04 DIAGNOSIS — L659 Nonscarring hair loss, unspecified: Secondary | ICD-10-CM | POA: Diagnosis not present

## 2022-10-04 DIAGNOSIS — Z6833 Body mass index (BMI) 33.0-33.9, adult: Secondary | ICD-10-CM | POA: Insufficient documentation

## 2022-10-04 DIAGNOSIS — R229 Localized swelling, mass and lump, unspecified: Secondary | ICD-10-CM | POA: Insufficient documentation

## 2022-10-04 DIAGNOSIS — N644 Mastodynia: Secondary | ICD-10-CM | POA: Diagnosis not present

## 2022-10-04 DIAGNOSIS — M25562 Pain in left knee: Secondary | ICD-10-CM | POA: Diagnosis present

## 2022-10-04 LAB — BASIC METABOLIC PANEL
ANION GAP: 12 mmol/L (ref 10–22)
BUN (UREA NITROGEN): 14 mg/dL (ref 7–18)
CALCIUM: 9.9 mg/dL (ref 8.5–10.5)
CARBON DIOXIDE: 24 mmol/L (ref 21–32)
CHLORIDE: 104 mmol/L (ref 98–107)
CREATININE: 0.6 mg/dL (ref 0.4–1.2)
ESTIMATED GLOMERULAR FILT RATE: >60 mL/min (ref >60)
Glucose Random: 98 mg/dL (ref 74–160)
POTASSIUM: 4.4 mmol/L (ref 3.5–5.1)
SODIUM: 140 mmol/L (ref 136–145)

## 2022-10-04 MED ORDER — LOSARTAN POTASSIUM 25 MG PO TABS
25.0000 mg | ORAL_TABLET | Freq: Every day | ORAL | 2 refills | Status: DC
Start: 2022-10-04 — End: 2023-06-25

## 2022-10-04 MED ORDER — LIRAGLUTIDE -WEIGHT MANAGEMENT 18 MG/3ML SC SOPN
PEN_INJECTOR | SUBCUTANEOUS | 0 refills | Status: DC
Start: 2022-10-04 — End: 2023-02-09

## 2022-10-04 NOTE — Progress Notes (Signed)
BP 124/77   Pulse 89   Temp 98.2 F (36.8 C) (Temporal)   Ht '4\' 11"'$  (1.499 m)   Wt 70.8 kg (156 lb)   LMP 07/11/2005   SpO2 97%   BMI 31.51 kg/m     S:    Senita Corredor is a 62 year old female who presents with:    Most Recent Weight Reading(s)  10/04/22 : 70.8 kg (156 lb)  08/04/22 : 71.8 kg (158 lb 4.6 oz)  06/18/22 : 68.9 kg (152 lb)  05/24/22 : 68 kg (150 lb)  04/26/22 : 71.2 kg (157 lb)    Past Medical History:  02/12/2016: Chronic right shoulder pain  No date: Depression  03/20/2012: Diverticulosis of colon      Comment:  Noted on colonoscopy 6/13   08/16/2010: Elevated hemoglobin A1c      Comment:  HEMOGLOBIN A1C (%) Date Value 03/06/2015 5.6                05/20/2014 5.4 05/08/2012 6.0 (H)   No results found for:               POCA1C   11/21/2012: Hyperopia with astigmatism and presbyopia  No date: Hypertension  11/21/2012: Immature cataract  01/21/2012: Impingement syndrome of right shoulder      Comment:  10/14: pt taking  [place] med (meloxicam 7.5 mg BID                and cyclobenzaprine); Will ask for refill once supply                finished; PCP ok with refill of flexeril on temporary                basis;   01/26/2008: Irritable bladder      Comment:  Seen by Dr Jeraldine Loots 5/09, no incontinence, likely                irritable bladder.  Trial of anticholinergic, refer to                urology, has appt Dr Minna Antis 02/23/08  02/16/2005: Leiomyoma of uterus, unspecified      Comment:  Dr Jeraldine Loots hysterectomy November, 2006; cervix retained,               last pap 11/11 neg Repeat pap/HPV 07/2015   02/15/2014: Mass of hand  No date: OSA (obstructive sleep apnea)  07/30/2011: Other plastic surgery for unacceptable cosmetic appearance      Comment:  Breast augmentation scheduled 08/02/11;  Dr.  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    ROS: Per HPI. No reported headaches, weakness/falls, chest pain, shortness of breath, abdominal pain/nausea/vomiting, numbness/tingling, fevers or chills.     Medications, allergies, PMH and SH reviewed and updated as necessary in Epic.    O:  PHYSICAL EXAM:  BP 124/77   Pulse 89   Temp 98.2 F (36.8 C) (Temporal)   Ht '4\' 11"'$  (1.499 m)   Wt 70.8 kg (156 lb)   LMP 07/11/2005  SpO2 97%   BMI 31.51 kg/m   GEN: NAD  HEENT: MMM  SKIN: WWP  CHEST: Breathing comfortably  NEURO: No focal deficits noted, alert  MSK: Normal gait observed  PSYCH: Appears stated age, appropriate grooming and dress, appropriate affect and mood congruent    A/P:  Stephanie [name] is a 62 year old female presents with:    1. Breast pain  Resolved  Was likely bruise    2. BMI 33.0-33.9,adult  - losartan (COZAAR) 25 MG tablet; Take 1 tablet by mouth in the morning.  Dispense: 90 tablet; Refill: 2    3. Hair loss  Reviewed labs    4. Skin mass  Was removed  Reviewed derm note    5. Chronic pain of left knee  Reviewed has upcoming apt with ortho     6. Hypertension, goal below 140/90  At goal   Overdue for BMP  - BASIC METABOLIC PANEL      We discussed the patient's current medications. The patient expressed understanding and no barriers to adherence were identified.    F/u as needed    Health Maintenance:  Reviewed HM      Lemar Livings, MD

## 2022-10-05 ENCOUNTER — Telehealth (HOSPITAL_BASED_OUTPATIENT_CLINIC_OR_DEPARTMENT_OTHER): Payer: Self-pay

## 2022-10-05 LAB — SURGICAL PATH SPECIMEN DERM CLINIC

## 2022-10-05 NOTE — Telephone Encounter (Signed)
Stephanie [name] is currently on manufacturer backorder, and we are unable to supply the medication for the patient. Patient has been managed on Victoza 1.2 mg daily and active refills remain on file.     The Saxenda order has been placed on hold until the product becomes available.     If an alternative is desired, the patient's insurance will cover Ozempic with a prior authorization. Please review and send desired medication for the patient.     Thank you!

## 2022-10-07 ENCOUNTER — Telehealth (HOSPITAL_BASED_OUTPATIENT_CLINIC_OR_DEPARTMENT_OTHER): Payer: Self-pay

## 2022-10-07 NOTE — Telephone Encounter (Signed)
Outreach to pt w/ interpreter services id# [phone]    Reviewed results w/pt per Dr Ronny Flurry.  Pt states understanding and agrees w/plan. No further questions.

## 2022-10-07 NOTE — Telephone Encounter (Signed)
-----   Message from Kelvin Cellar, MD sent at 10/07/2022  1:12 PM EST -----  Can you let the patient know the biopsy results were benign, showed a benign sunspot, no further management needed unless it grows back. Thanks!    Stephanie Rutherford, MD  [facility]

## 2022-10-08 ENCOUNTER — Ambulatory Visit: Payer: No Typology Code available for payment source | Attending: Plastic Surgery | Admitting: Plastic Surgery

## 2022-10-08 ENCOUNTER — Other Ambulatory Visit: Payer: Self-pay

## 2022-10-08 DIAGNOSIS — L720 Epidermal cyst: Secondary | ICD-10-CM | POA: Diagnosis present

## 2022-10-08 NOTE — Progress Notes (Signed)
Plastic surgery consultation    This is a 62 year old female with a mass on her left temple.  It has been present for more than a year.  She thinks someone tried to remove it but it has returned.  She denies any recent bleeding or drainage or infection.    Patient has no known drug allergies.  She takes several meds including Saxenda for weight loss.  No tobacco use    No history of skin cancer    Examination on the patient's left temple area there is a raised round tender mass.    It measures 2.5 cm in diameter and 1.5 cm in height.    The overlying skin is very thin and friable.  There is no drainage no erythema.    Plan    I explained to the patient that this most likely represents an inclusion or sebaceous cyst.  We can excise it in an upcoming scheduled clinic.    I described the procedure to her and answered all of her questions we will set this up soon.

## 2022-10-09 MED ORDER — OZEMPIC (0.25 OR 0.5 MG/DOSE) 2 MG/1.5ML SC SOPN
0.5000 mg | PEN_INJECTOR | SUBCUTANEOUS | 0 refills | Status: DC
Start: 2022-10-09 — End: 2022-10-16

## 2022-10-11 ENCOUNTER — Telehealth (HOSPITAL_BASED_OUTPATIENT_CLINIC_OR_DEPARTMENT_OTHER): Payer: Self-pay

## 2022-10-11 ENCOUNTER — Other Ambulatory Visit: Payer: Self-pay

## 2022-10-11 ENCOUNTER — Encounter (HOSPITAL_BASED_OUTPATIENT_CLINIC_OR_DEPARTMENT_OTHER): Payer: Self-pay | Admitting: Family Medicine

## 2022-10-11 NOTE — Prior Authorization (Signed)
Patient Insurance: [facility]  Member ID: [id number]  [place]:[id number]  PCN: [id number]  Group: [id number]  Prior Authorization for: Ozempic    [facility]

## 2022-10-11 NOTE — Telephone Encounter (Signed)
Per [employer] book pts excision w/Dr Letta Moynahan for 03/22@11AM$ . booked appt and called with interpreter to pts phone. lvm for callback, gave date and time and dr and my number to call me back if any questions or need to reschedule-ch

## 2022-10-16 ENCOUNTER — Ambulatory Visit: Payer: No Typology Code available for payment source | Attending: Family Medicine | Admitting: Family Medicine

## 2022-10-16 DIAGNOSIS — Z6833 Body mass index (BMI) 33.0-33.9, adult: Secondary | ICD-10-CM | POA: Diagnosis present

## 2022-10-16 DIAGNOSIS — R52 Pain, unspecified: Secondary | ICD-10-CM | POA: Insufficient documentation

## 2022-10-16 NOTE — Progress Notes (Signed)
Telemedicine Visit    S:    Pryce Bungert is a 62 year old female who complains of the following:     Started on Monday   So sore - felt terrible   Didn't take one day   And then got a little better  Neck and     Past Medical History:  02/12/2016: Chronic right shoulder pain  No date: Depression  03/20/2012: Diverticulosis of colon      Comment:  Noted on colonoscopy 6/13   08/16/2010: Elevated hemoglobin A1c      Comment:  HEMOGLOBIN A1C (%) Date Value 03/06/2015 5.6                05/20/2014 5.4 05/08/2012 6.0 (H)   No results found for:               POCA1C   11/21/2012: Hyperopia with astigmatism and presbyopia  No date: Hypertension  11/21/2012: Immature cataract  01/21/2012: Impingement syndrome of right shoulder      Comment:  10/14: pt taking  [place] med (meloxicam 7.5 mg BID                and cyclobenzaprine); Will ask for refill once supply                finished; PCP ok with refill of flexeril on temporary                basis;   01/26/2008: Irritable bladder      Comment:  Seen by Dr Jeraldine Loots 5/09, no incontinence, likely                irritable bladder.  Trial of anticholinergic, refer to                urology, has appt Dr Minna Antis 02/23/08  02/16/2005: Leiomyoma of uterus, unspecified      Comment:  Dr Jeraldine Loots hysterectomy November, 2006; cervix retained,               last pap 11/11 neg Repeat pap/HPV 07/2015   02/15/2014: Mass of hand  No date: OSA (obstructive sleep apnea)  07/30/2011: Other plastic surgery for unacceptable cosmetic appearance      Comment:  Breast augmentation scheduled 08/02/11;  Dr.  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 Doesn't report  headaches, weakness/falls, chest pain, shortness of breath, abdominal pain/nausea/vomiting, numbness/tingling, fevers or chills.     Medications, allergies, PMH and SH reviewed and updated as necessary in Epic.    O:  No breathlessness  Speaking in full sentences  Normal affect    A/P:  Claris Bigbie is a 62 year old female presents with:    1. Generalized body aches  Reviewed unlikely meds   Reviewed likely viral infection - already getting better  Tylenol and ibuprofen as needed  Hold off restarting med at lowest dose until feeling 100%    2. BMI 33.0-33.9,adult        Lemar Livings, MD

## 2022-10-18 NOTE — Prior Authorization (Signed)
Phone:    Filled out Prior Authorization Request form for: Ozempic  DX & ICD 10: Obesity (30 to 46)     Form does not require provider signature - already faxed form to insurance company for review.  Scanned form into Epic under Environmental health practitioner - [place] for: Ozempic            Phone:    Filled out Prior Authorization Request form for: Ozempic  DX & ICD 10: Obesity (30 to 75)     Form does not require provider signature - already faxed form to insurance company for review.  Scanned form into Epic under Environmental health practitioner - [place] for: Ozempic

## 2022-10-18 NOTE — Prior Authorization (Signed)
Phone:    Filled out Prior Authorization Request form for: Ozempic  DX & ICD 10: Obesity (30 to 85)     Form does not require provider signature - already faxed form to insurance company for review.  Scanned form into Epic under Environmental health practitioner - [place] for: Ozempic            Phone:    Filled out Prior Authorization Request form for: Ozempic  DX & ICD 10: Obesity (30 to 37)     Form does not require provider signature - already faxed form to insurance company for review.  Scanned form into Epic under Environmental health practitioner - [place] for: Ozempic

## 2022-10-22 NOTE — Prior Authorization (Signed)
Status:    Prior Authorization Status: Approved  Type: Pharmacy       Pharmacy:    Start Date: 10/18/22  End Date: 02/16/23  PA #: [id number]  Spoke to: LVM FOR PATIENT     The patient and their preferred pharmacy are aware of the approval.  Approval notice has been scanned into Environmental health practitioner.

## 2022-11-02 ENCOUNTER — Ambulatory Visit
Admission: RE | Admit: 2022-11-02 | Discharge: 2022-11-02 | Disposition: A | Discharge status: Home / Self Care | Payer: No Typology Code available for payment source | Attending: Diagnostic Radiology | Admitting: Diagnostic Radiology

## 2022-11-02 ENCOUNTER — Other Ambulatory Visit: Payer: Self-pay

## 2022-11-02 DIAGNOSIS — M810 Age-related osteoporosis without current pathological fracture: Secondary | ICD-10-CM | POA: Insufficient documentation

## 2022-11-02 DIAGNOSIS — Z780 Asymptomatic menopausal state: Secondary | ICD-10-CM

## 2022-11-02 DIAGNOSIS — S52502A Unspecified fracture of the lower end of left radius, initial encounter for closed fracture: Secondary | ICD-10-CM

## 2022-11-10 ENCOUNTER — Telehealth (HOSPITAL_BASED_OUTPATIENT_CLINIC_OR_DEPARTMENT_OTHER): Payer: Self-pay

## 2022-11-10 NOTE — Telephone Encounter (Signed)
Call to patient with [place] interpreter ID: [phone].    Left VM

## 2022-11-10 NOTE — Telephone Encounter (Signed)
Spoke with patient with [place] interpreter [phone].  She would like to be seen in clinic. Appt booked for Friday with Buel Ream, [place] at [facility]

## 2022-11-10 NOTE — Telephone Encounter (Signed)
-----   Message from Festus Barren sent at 11/09/2022  3:46 PM EDT -----  Regarding: von Deck - Extreme pain  Contact: [phone]  Pt called to report they are in extreme bilateral knee pain constant 9/10. Post Op Lft TKR 04/26/22. Pt also reports ain affects arm and wrist that is post op night 8/10 day 6/10. Next appt scheduled 4/26.     Please call Pt at [phone] to advise pain mgt.    Thank you

## 2022-11-12 ENCOUNTER — Ambulatory Visit
Admission: RE | Admit: 2022-11-12 | Discharge: 2022-11-12 | Disposition: A | Discharge status: Home / Self Care | Payer: No Typology Code available for payment source | Source: Ambulatory Visit | Attending: Radiology | Admitting: Radiology

## 2022-11-12 ENCOUNTER — Ambulatory Visit (HOSPITAL_BASED_OUTPATIENT_CLINIC_OR_DEPARTMENT_OTHER): Payer: No Typology Code available for payment source | Admitting: Ophthalmology

## 2022-11-12 ENCOUNTER — Encounter (HOSPITAL_BASED_OUTPATIENT_CLINIC_OR_DEPARTMENT_OTHER): Payer: Self-pay | Admitting: Ophthalmology

## 2022-11-12 ENCOUNTER — Other Ambulatory Visit: Payer: Self-pay

## 2022-11-12 ENCOUNTER — Ambulatory Visit: Payer: No Typology Code available for payment source | Attending: Orthopaedic Surgery | Admitting: Physician Assistant

## 2022-11-12 DIAGNOSIS — H269 Unspecified cataract: Secondary | ICD-10-CM | POA: Insufficient documentation

## 2022-11-12 DIAGNOSIS — Z96652 Presence of left artificial knee joint: Secondary | ICD-10-CM | POA: Insufficient documentation

## 2022-11-12 DIAGNOSIS — M25531 Pain in right wrist: Secondary | ICD-10-CM | POA: Insufficient documentation

## 2022-11-12 DIAGNOSIS — H524 Presbyopia: Secondary | ICD-10-CM | POA: Insufficient documentation

## 2022-11-12 DIAGNOSIS — M85831 Other specified disorders of bone density and structure, right forearm: Secondary | ICD-10-CM | POA: Diagnosis not present

## 2022-11-12 DIAGNOSIS — H52203 Unspecified astigmatism, bilateral: Secondary | ICD-10-CM | POA: Insufficient documentation

## 2022-11-12 DIAGNOSIS — H5203 Hypermetropia, bilateral: Secondary | ICD-10-CM | POA: Diagnosis not present

## 2022-11-12 MED ORDER — MELOXICAM 15 MG PO TABS
15.0000 mg | ORAL_TABLET | Freq: Every day | ORAL | 1 refills | Status: DC
Start: 2022-11-12 — End: 2024-02-10

## 2022-11-12 MED ORDER — DICLOFENAC SODIUM 1 % EX GEL
4.0000 g | Freq: Four times a day (QID) | CUTANEOUS | 0 refills | Status: DC
Start: 2022-11-12 — End: 2022-11-12

## 2022-11-12 MED ORDER — METHYLPREDNISOLONE ACETATE 80 MG/ML IJ SUSP
80.00 mg | Freq: Once | INTRAMUSCULAR | 0 refills | Status: AC
Start: 2022-11-12 — End: 2022-11-12

## 2022-11-12 NOTE — Progress Notes (Signed)
Orthopedic Office Note    CC: Patient presents with:  Follow Up    ORTHOPEDIC PROBLEM LIST:  1. S/p left distal radius ORIF, DOS: 04/18/2022  2. S/p left TKA, DOS: 10/12/21  3. Right knee osteoarthritis   4. Bilateral arm and leg pain   5. Right wrist pain     HPI: Stephanie [name] is a 62 year old female who presents for follow-up today. She is complaining of multiple orthopedic issues and is having pain throughout most of her body. Asked to focus on areas of most pain and we discussed the right knee and right wrist. She also had a left TKA just over a year ago. The knee seems to be feeling fine but the leg is sore. She has pain in the right knee and throughout the right leg. Pain is mostly from the knee down, but sometimes the thighs too. No new injuries or falls. This pain started a couple of weeks ago but has been getting worse this week. She recently had a bone density scan and would like to know what these results showed. She has right knee osteoarthritis. Does not know when her last injection in the right knee was but thinks it was a long time ago. Did PT after the TKA but is not going anymore. She takes ibuprofen and tylenol. Was taking oxycodone after surgery and that is the only thing that has helped any of her pain. She is also complaining of right wrist pain. Mostly over the radial aspect. This started about a month ago, atraumatically. There is no numbness or tingling distally. She has a brace at home but has not been wearing it. 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 She is not using any assistive device today.     IMAGING: x-ray of the right wrist today shows no acute findings by my review. There is generalized osteopenia. X-ray of the bilateral standing knees from 09/24/2022 shows moderate medial compartment narrowing of the right knee. Imaging was personally reviewed today.    PHYSICAL EXAM:  GENERAL: Alert and oriented, in no acute distress  MOOD: Appropriate.   MUSCULOSKELETAL:   Examination  of the right knee shows slight varus deformity. Overlying skin is warm, dry, and intact. There is no erythema, edema, or ecchymosis. There is a small effusion. There is mild crepitus over the patella with active flexion and extension of the knee. There is medial joint line tenderness, no lateral joint line tenderness. Active ROM 0-120 degrees. Ligaments are stable to valgus and varus stressing. Calf is soft and nontender. Neurovascularly intact distally.    Examination of the right wrist shows no gross bony deformity. Overlying skin is warm, dry, and intact. There is no erythema, edema, or ecchymosis. There is global tenderness to palpation over the radial side of the forearm and wrist. There is mild tenderness at the base of the [facility] joint. She has pain with Wynn Maudlin test. She is able to make a complete fist with good grip strength. She has full range of motion of the wrist with some discomfort. Neurovascularly intact distally.    ASSESSMENT/PLAN: 62 year old female who presents today for follow-up of multiple orthopedic issues. She is having rather diffuse pain throughout her upper and lower extremities, so we focused today's visit on her right knee and right wrist. She has at least moderate right knee osteoarthritis seen on x-rays earlier this year. She has not had much treatment for this recently. We reviewed the options. She would like  to start an oral medication for this. I provided her with a prescription of Mobic 15 mg to take with food to help with the pain and inflammation. She will also apply Voltaren gel over the knee as needed. Finally, offered a cortisone injection for symptomatic relief which she would like to try. After timeout, using sterile technique, 80 mg of depo-medrol mixed with 4 cc of 1% Xylocaine was injected into the right knee intra-articularly. The patient tolerated this well and there were no complications. She is referred for physical therapy given her chronic bilateral leg pain. She  is also complaining of right wrist pain over the past month, atraumatic. Most consistent with tendinitis. X-rays were negative today. She has a wrist splint at home which she will wear. She will also apply the diclofenac gel. She did have a bone density scan recently which showed osteoporosis. She will discuss further with Dr. Madelaine Etienne at the end of April and can follow up on these issues at  that time as well. She may contact us in the interim with any concerns.    We reviewed the likely diagnosis, the prognosis, and various treatment options in detail. Risks and benefits of treatment plan discussed. The patient's questions have been answered, and the patient understands agrees with treatment plan.     I spent a total of 35 minutes on this visit on the date of service (total time includes all activities performed on the date of service).    Patient was seen independently in [place] clinic today.    Denice Paradise, PA-C, 11/12/2022    Nursing Communication:  None

## 2022-11-12 NOTE — Progress Notes (Addendum)
Patient presents with:  Blurry VA: Blurry vision, both eyes, at distance, even with her glasses. For comprehensive eye exam.    Cataracts, OU, early. At this point, she should do well with glasses.  Follow up PRN, or one year.      12/16/22:    Dr. Nathaneil Canary,    Could you please review the photo attached to this patient's chart? You saw her on March 13 for her annual. She saw Dr. Threasa Beards last week and he's referring her for a biopsy of a lesion. From [employer].    I reviewed the Photo in Careers information officer reviewed.    Will schedule excisional biopsy of eyelid lesion RLL laterally.

## 2022-11-12 NOTE — Progress Notes (Signed)
Here for Annual Eye Exam,H/O [place].   Vision is blurry OU even with glasses.   Glare.   Stopped driving at night   No pain.   No flashes/floaters.    Ocular meds   None

## 2022-11-19 ENCOUNTER — Other Ambulatory Visit: Payer: Self-pay

## 2022-11-19 ENCOUNTER — Ambulatory Visit: Payer: No Typology Code available for payment source | Attending: Plastic Surgery | Admitting: Plastic Surgery

## 2022-11-19 VITALS — BP 136/78 | HR 77

## 2022-11-19 DIAGNOSIS — L720 Epidermal cyst: Secondary | ICD-10-CM | POA: Diagnosis not present

## 2022-11-19 NOTE — Progress Notes (Signed)
Patient tolerated e/o lesion to left temple procedure well. VSS. No c/o pain. No evidence of bleeding. Site-care instructions reviewed with Pt. who has good understanding.

## 2022-11-19 NOTE — Progress Notes (Signed)
Brief Procedure or Operative Note    Procedure: Excision of mass left temple    Pre-Procedure Diagnosis: Mass left temple    Post-Procedure Diagnosis: Inclusion cyst    Surgeon: Nile Dear, MD    Assistant (if applicable): * Surgery not found *    Type of Anesthesia: 1% lidocaine 1-100,000 epinephrine 3 mL    Findings:   no abnormalities    Estimated Blood Loss: 0 mL    Specimens Removed: * Cannot find log *    Complications:  None    Other (e.g. Implants):  * No surgical log found * * None in log *    Description of procedure    After informed consent was obtained the left temple area was prepped and draped with Betadine in usual sterile manner.  I outlined the lesion with a sterile marking pen.    Total excision was 1.8 cm wide and 2.5 cm in length.  I infiltrated the local solution and waited for good effect.    Using a 15 blade scalpel I excised the skin the lesion and the underlying mass all in 1 piece.  This was deep into the deep fat.    I used a cautery device to achieve hemostasis.    I sent the specimen off for for review.    I closed in layers.  5-0 Monocryl interrupted buried in the deep dermis.  6-0 Prolene interrupted on the skin.    Patient tolerated procedure well.  Neosporin and Band-Aid were applied.  Postop wound care instructions were given and she will follow-up in 1 week.        Nile Dear, MD  Pager : [phone]                  .

## 2022-11-23 LAB — SURGICAL PATH SPECIMEN SOFT TISSUE

## 2022-11-26 ENCOUNTER — Ambulatory Visit: Payer: No Typology Code available for payment source | Attending: Plastic Surgery | Admitting: Plastic Surgery

## 2022-11-26 ENCOUNTER — Other Ambulatory Visit: Payer: Self-pay

## 2022-11-26 DIAGNOSIS — E8940 Asymptomatic postprocedural ovarian failure: Secondary | ICD-10-CM | POA: Insufficient documentation

## 2022-11-26 DIAGNOSIS — L720 Epidermal cyst: Secondary | ICD-10-CM | POA: Insufficient documentation

## 2022-11-26 DIAGNOSIS — R7303 Prediabetes: Secondary | ICD-10-CM | POA: Insufficient documentation

## 2022-11-26 DIAGNOSIS — F411 Generalized anxiety disorder: Secondary | ICD-10-CM | POA: Insufficient documentation

## 2022-11-26 DIAGNOSIS — K760 Fatty (change of) liver, not elsewhere classified: Secondary | ICD-10-CM | POA: Insufficient documentation

## 2022-11-26 NOTE — Progress Notes (Signed)
Plastic surgery    This is a follow-up visit for this pleasant 62 year old woman who had a mass removed from her left temple 1 week ago.    She reports no problems.  No bleeding or signs of infection.    Examination    Her incision is intact there is no erythema sutures are in place.    Plan    I removed her sutures without difficulty using an 11 blade.    I applied Neosporin and Band-Aid.    We reviewed her path which showed an inclusion cyst I explained to her this is benign and nothing else needs to be done.  Follow-up will be as needed

## 2022-12-10 ENCOUNTER — Ambulatory Visit (HOSPITAL_BASED_OUTPATIENT_CLINIC_OR_DEPARTMENT_OTHER): Payer: No Typology Code available for payment source

## 2022-12-15 ENCOUNTER — Other Ambulatory Visit: Payer: Self-pay

## 2022-12-15 ENCOUNTER — Ambulatory Visit: Payer: No Typology Code available for payment source | Attending: Dermatology | Admitting: Dermatology

## 2022-12-15 DIAGNOSIS — D485 Neoplasm of uncertain behavior of skin: Secondary | ICD-10-CM

## 2022-12-15 NOTE — Progress Notes (Signed)
[facility]  [address] [place], [place] [address]    Dermatology Clinic Note    CC: spot checks.   ?  HPI: Stephanie [name] is a 62 year old female     Here for spot check of lesion on the R lower lateral eyelid.   ?  ROS: negative, no other skin complaints.  Feels well, no systemic complaints.   ?  Past Dermatologic hx:        SPEC #: [id number]        RECD: 11/22/22-0713  STATUS: SOUT  SP TYPE: SOFTTISSUE      COLL: 11/19/22-  SUB DR: Lucienne Minks MD     ENTERED:  11/22/22-0713     ORDERED:  [id number], HE     Performed at [facility]  [address] [place] [address] [phone]     >>FINAL DIAGNOSIS<<     CYST, LEFT TEMPLE, EXCISION:  -Epidermal inclusion cyst     Diagnosis by: Luanne Bras, MD     CLINICAL HISTORY     Clinical history:  SOFT TISSUE LESION TO LEFT TEMPLE     SPEC #: [id number]        RECD: 09/28/22-1345  STATUS: SOUT   SP TYPE: DERM            COLL: 09/27/22-   SUB DR: Marlowe Shores MD      ENTERED:  09/28/22-1345      ORDERED:  [id number], L1-L3/2, L4-L6/2      Performed at [facility], [facility]   [address], [place] [address] [phone]      >>FINAL DIAGNOSIS<<     SKIN, LEFT UPPER ARM, SHAVE BIOPSY:   Lentigo simplex.      Diagnosis by: Avie Arenas, MD      CLINICAL HISTORY      Preop diagnosis:   R/O ATYPICAL NEVUS      Surgical procedure:   SKIN SHAVE      GROSS DESCRIPTION      The specimen is received in formalin labeled with patient's name, unit   number and designated " left arm" and consists of a 1.3 x 1.0 cm tan-brown,   variegated, finely granular, irregular skin shave with no identifiable   lesion on the skin surface. The specimen is inked, step sectioned, and   entirely submitted in two cassettes labeled as follows:   ?  PMH:  Patient Active Problem List:     BMI 33.0-33.9,adult     Pure hypercholesterolemia     Family history of malignant neoplasm of gastrointestinal tract     Tubular adenoma of  colon     Hyperopia with astigmatism and presbyopia     Immature cataract     Hypertension, goal below 140/90     Adenomatous polyp of colon     Squamous 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 eyes     Alcohol abuse     Depression, recurrent (HCC)     Trauma and stressor-related disorder     Nontraumatic complete tear of right rotator cuff     Localized osteoarthritis of left knee     Obesity (BMI 30-39.9)     Chronic pain of left knee     Primary osteoarthritis of left knee     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  Meds:  ?     Medication List            Accurate as of December 21, 2021  2:03 PM. If you have any questions, ask your nurse or doctor.                CONTINUE taking these medications      dexamethasone 4 MG tablet  Commonly known as: DECADRON     Fish Oil 1000 MG Caps     Glucosamine 750 MG Tabs     Insulin Pen Needle 31G X 8 MM Misc  Inject under the skin     liraglutide 18 MG/3ML pen injector  Commonly known as: VICTOZA  Start 0.6mg  x 1 wk, then 1.2mg  x 1 wk and then 1.8mg  x 1 wk     losartan 25 MG tablet  Commonly known as: COZAAR  Take 1 tablet by mouth in the morning.     omeprazole 40 MG capsule  Commonly known as: PriLOSEC  Take 1 capsule by mouth in the morning.     traZODone 50 MG tablet  Commonly known as: DESYREL  TAKE 1 TABLET BY MOUTH NIGHTLY     Turmeric 500 MG Caps            ?  All:  Review of Patient's Allergies indicates:  No Known Allergies?     SHx:  ?Social History    Tobacco Use      Smoking status: Former        Types: Cigarettes        Quit date: 07/27/2001        Years since quitting: 21.4        Passive exposure: Never      Smokeless tobacco: Never    Alcohol use: Yes      Alcohol/week: 6.0 standard drinks of alcohol      Types: 6 Cans of beer per week      Comment: 6 beers per week     Drug use: No       FHx:  Hx of  melanoma - , hx of NMSC -  ?  Physical exam/Assessment/Plan:  Well appearing pt in NAD  Mood and affect are wnl  A skin examination was performed including face  Skin type: III    # Neoplasm of uncertain behavior  - Right lower lateral lid with 5x4 mm brown papule with non arborizing telangs. DDX also includes pigmented BCC vs hidrocystoma  - offered options of monitoring vs biopsy, she prefers to biopsy, will refer her to Ophthalmology.         RTC: 31mo for tBSE  ?  ?  ?  Maudry Mayhew, MD  [facility]         CC: Mercer Pod, MD  [address]  [place] [address]

## 2022-12-24 ENCOUNTER — Ambulatory Visit
Admission: RE | Admit: 2022-12-24 | Discharge: 2022-12-24 | Disposition: A | Discharge status: Home / Self Care | Payer: No Typology Code available for payment source | Attending: Orthopaedic Surgery | Admitting: Orthopaedic Surgery

## 2022-12-24 ENCOUNTER — Other Ambulatory Visit: Payer: Self-pay

## 2022-12-24 ENCOUNTER — Ambulatory Visit: Payer: No Typology Code available for payment source | Attending: Orthopaedic Surgery | Admitting: Orthopaedic Surgery

## 2022-12-24 DIAGNOSIS — M2550 Pain in unspecified joint: Secondary | ICD-10-CM | POA: Diagnosis present

## 2022-12-24 DIAGNOSIS — M810 Age-related osteoporosis without current pathological fracture: Secondary | ICD-10-CM | POA: Diagnosis not present

## 2022-12-24 DIAGNOSIS — Z96652 Presence of left artificial knee joint: Secondary | ICD-10-CM | POA: Diagnosis not present

## 2022-12-24 LAB — URIC ACID: URIC ACID: 5 mg/dL (ref 2.6–6.0)

## 2022-12-24 LAB — VITAMIN D,25 HYDROXY: VITAMIN D,25 HYDROXY: 43 ng/mL (ref 30.0–100.0)

## 2022-12-24 MED ORDER — ALENDRONATE SODIUM 70 MG PO TABS
ORAL_TABLET | ORAL | 4 refills | Status: DC
Start: 2022-12-24 — End: 2023-06-27

## 2022-12-24 NOTE — Progress Notes (Addendum)
Orthopedic Office Note    CC: Patient presents with:  Follow Up: Right knee pain      ORTHOPEDIC PROBLEM LIST:  1. S/p left distal radius ORIF, DOS: 04/18/2022  2. S/p left TKA, DOS: 10/12/21  3. Right knee osteoarthritis   4. Bilateral arm and leg pain   5. Right wrist pain     HPI: Stephanie [name] is a 62 year old female who presents for follow-up today to review her bone density screening. She states she does not take vitamin D or calcium. Her daughter is a Teacher, early years/pre and advised her to ask for a Uric acid level today due to her multiple joint complaints over the years.    In regards to her chronic orthopedic issues: Her right knee was injected at the last visit, this is doing very well.  The injection was about 1 month ago.  Her left knee has a total knee replacement.  This left knee will occasionally bother her.  She does not feel that she needs intervention for that today.      In regards to her left wrist, this is also doing very well. She has minimal pain in the wrist.    Recently her bilateral upper shoulders have been bothering her with the left side bothering her more than the right.  She was told back in [place] that she had bursitis.  She did have an injection many years ago into the shoulder.  She is not interested in doing any injections into the shoulders today.    IMAGING: x-ray of the right wrist from 11/12/22 shows no acute findings by my review. There is generalized osteopenia.   X-ray of the bilateral standing knees from 09/24/2022 shows moderate medial compartment narrowing of the right knee.  Total knee replacement present on the left knee.  Bone density test shows lowest measured BMD with a T-score of -4.4  Imaging was personally reviewed by myself and Dr. Jacquelin Hawking today.    PHYSICAL EXAM:  GENERAL: Alert and oriented, in no acute distress  MOOD: Appropriate.   MUSCULOSKELETAL:   Examination of the right knee shows slight varus deformity. Overlying skin is warm, dry, and intact. There is no erythema,  edema, or ecchymosis. Calf is soft and nontender. Neurovascularly intact distally.    Examination of the right wrist shows no gross bony deformity. Overlying skin is warm, dry, and intact.  She has full range of motion of the wrist with some discomfort. Neurovascularly intact distally.    There is no erythema, edema, ecchymosis, or deformity of the bilateral shoulders. Patient is non tender to palpation at the [facility] joint, bicipital groove, or along the lateral acromion. Active shoulder flexion to 90 degrees.(+) Neer's test. 4/5 strength. NVI distally.       ASSESSMENT/PLAN: 62 year old female who presents today for follow-up of multiple orthopedic issues.  Her knees are doing well today and she does not need any intervention.  This is great news.  In regards to her left wrist, this also continues to do well.    She does have some new onset bilateral shoulder pain.  She had injections in the past but does not wish to repeat injections today.  We discussed with her that if she would like to pursue this in the future we are happy to do that for her.  Patient is understanding.    We reviewed the results of the bone mineral density test with her.  She does have pretty significant osteoporosis.  We have started her on Fosamax to take for the next year once weekly.  We have also ordered a vitamin D level.  Her daughter did request a uric acid level, which we have ordered as well.  If her vitamin D is low, we will be calling her to ensure that she gets proper supplementation.    We will see her back in 2 months.  She may contact the office with any questions in the interim.    We reviewed the likely diagnosis, the prognosis, and various treatment options in detail. Risks and benefits of treatment plan discussed. The patient's questions have been answered, and the patient understands agrees with treatment plan.     Dr. Scot Jun Deck saw and examined the patient and formulated the assessment and plan.    Sharlette Dense, PA-C,  12/24/2022    Nursing Communication:    None

## 2022-12-25 NOTE — Addendum Note (Signed)
Addended by: Jacquelin Hawking, Raymel Cull on: 12/25/2022 08:14 AM     Modules accepted: Level of Service

## 2022-12-31 ENCOUNTER — Ambulatory Visit (HOSPITAL_BASED_OUTPATIENT_CLINIC_OR_DEPARTMENT_OTHER): Payer: No Typology Code available for payment source

## 2022-12-31 ENCOUNTER — Other Ambulatory Visit: Payer: Self-pay

## 2022-12-31 ENCOUNTER — Ambulatory Visit (HOSPITAL_BASED_OUTPATIENT_CLINIC_OR_DEPARTMENT_OTHER): Payer: No Typology Code available for payment source | Admitting: Rehabilitative and Restorative Service Providers"

## 2022-12-31 DIAGNOSIS — G8929 Other chronic pain: Secondary | ICD-10-CM | POA: Insufficient documentation

## 2022-12-31 DIAGNOSIS — Z96652 Presence of left artificial knee joint: Secondary | ICD-10-CM | POA: Diagnosis present

## 2022-12-31 DIAGNOSIS — M25562 Pain in left knee: Secondary | ICD-10-CM | POA: Diagnosis present

## 2022-12-31 NOTE — Progress Notes (Signed)
OUTPATIENT PHYSICAL THERAPY EVALUATION    Referring Physician: Alwyn Ren, PA-C  Date of Onset: chronic    Chronic pain of left knee: M25.562     Subjective:   Pt presents with left knee. She had a knee replacement, did okay with a bout of PT. It's still been painful, difficult to bend, unable to kneel, pain with standing or walking a long time. It's a little more difficult to go down stairs. Pain is a little below the knee cap. It can become numb in the area.       Pain:  Average: 5/10   At worse: 6/10   At best: 2/10  Pre-morbid Functional Level: chronic  Functional Deficits: pain with prolonged standing/walking, difficulty and pain with squatting and bending, difficulty descending stairs    PMH:    has a past medical history of Chronic right shoulder pain (02/12/2016), Depression, Diverticulosis of colon (03/20/2012), Elevated hemoglobin A1c (08/16/2010), Hyperopia with astigmatism and presbyopia (11/21/2012), Hypertension, Immature cataract (11/21/2012), Impingement syndrome of right shoulder (01/21/2012), Irritable bladder (01/26/2008), Leiomyoma of uterus, unspecified (02/16/2005), Mass of hand (02/15/2014), OSA (obstructive sleep apnea), Other plastic surgery for unacceptable cosmetic appearance (07/30/2011), PONV (postoperative nausea and vomiting), Primary osteoarthritis of left knee (10/12/2021), 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      Contraindications/Precautions: WBAT    Occupation: cleaner  Dressing/Grooming: n/a  Driving: n/a  Sleeping: n/a    Primary Language: [place] ; Requires Interpreter: yes     Objective:  Please Note: Only populated fields were assessed by provider, fields left blank were not assessed.    Palpation: ttp medial joint line, medial-infero aspect of kneecap   Posture: pt stands with inc  genu valgum        Range of Motion and Strength:   LEFT LEFT RIGHT RIGHT    A/PROM MMT A/PROM MMT   HIP         FLEX  4/5  5/5     EXT         IR         ER  4/5  4/5     ABD  3+/5  4/5     ADD       KNEE         FLEX 118 4+/5 WNL 5/5     EXT 3 4/5 WNL 5/5   ANKLE         DF         PF         INV         EV         Joint Mobility:    Patella, all directions: 4-/6    KNEE L R     Valgus Stress       Varus Stress       Apley's       McMurray's       Ant/Post Drawer       Lachman's       Patellar Apprehension          ANKLE       Ant Drawer       Talar Tilt       SLS 3 seconds mod sway, poor ankle strategies 5 seconds, min-mod sway, poor ankle strategies         Gait: Pt amb I with no AD,  good heel strike, fair toe off       12/31/22 1400   Language Information   Language Needs Met By: Telephone Interpreter   Precautions   Precautions No   Rehab Discipline   Rehab Discipline PT   Visit   Visit number 1   POC Due date 01/30/23   Time Calculation   Start Time 1410   Stop Time 1455   Time Calculation (min) 45 min   Ther Exercise   Exercise side hip abduction, SAQ in hook lying 5' with eccentric lower, bridge with hip add   Ther Exercise 2   Exercise (cont with resisted LAQ, HS curls, 3 way hip, monster-lateral band walks, assisted squats, staggered sit to stands, balance - tandem and semi-tandem EC and EO, quad stretch with belt)   Patient Education   What was taught? dx, PT POC, HEP   Method Practice;Demo;Verbal   Patient comprehension Yes         Physical Therapy Plan of Care    MD: Mercer Pod, MD  Referring Provider: Alwyn Ren, PA-C  Diagnosis: chronic left knee pain    Assessment:   Pt presents with chronic left knee pain following THA last year. She never fully recovered, still has difficult with prolonged standing, walking, squatting and descending stairs. She demo good AROM, but has weakness throughout LE and dec SLS with mod sway indicating poor proprioception. Pt will greatly benefit from skilled PT to  address pain and deficits to reduce stressors on knee complex to assist with ADL's to return to PLOF.    Prognosis: good    Short Term Goals: 3 weeks  Dec pain by 2  Inc MMT by 1/3  I with HEP    Long Term Goals: 6 weeks    Inc MMT of LE by 1 muscle grade to facilitate normal mechanics throughout lower chain to assist with walking tolerance and management of stairs at home and with community ambulation  Inc SLS to 10 sec with min-no sway on level surface to promote inc proprioception to assist with gait with even and uneven surfaces  Pt will be able to squat to chair x10 to promote functional movements and personal & occupational responsibilities  Pt will be able to ascend and descend stairs with max 2/10 pain    Physical Therapy will consist of:   ** PT Eval - Low Complexity (CPT 97161)  ** PT Re-Eval (CPT 97164)  ** Stretching/ROM/Therapeutic Exercise (97110)  ** Self-Care/Home Management Training (CPT A766235)  ** Neuromuscular Re-education (CPT O1995507)  ** Manual Therapy / Joint / Soft tissue Mobilization (CPT 97140)  ** Hot/Cold Rx (CPT 97010)  ** Gait Training (CPT L092365)  ** Functional Activities (CPT 97530)    Recommend physical therapy 1 times per week for 4 weeks, after which a reassessment for future physical therapy needs will occur.    Discussed and educated patient on HEP, prognosis, and POC. Patient is agreeable to treatment plan and is aware of attendance policy.    Patient Stephanie [name] is aware of attendance policy: Yes  Plan of care discussed with Patient/Family: Yes  Patient goals reviewed and incorporated in plan of care: Yes  Patient/Family agrees with plan of care: Yes  Patient/Family education: Yes  Does patient feel safe at home: Yes         Lanae Boast, PT, DPT    Marney Setting, PT, Lic # [id number]

## 2022-12-31 NOTE — Progress Notes (Signed)
I certify that the documented Treatment Plan is reasonable and necessary.    12/31/2022  Samarth Ogle, PA-C

## 2023-01-04 ENCOUNTER — Other Ambulatory Visit: Payer: Self-pay

## 2023-01-04 ENCOUNTER — Ambulatory Visit: Payer: No Typology Code available for payment source | Attending: Ophthalmology | Admitting: Ophthalmology

## 2023-01-04 ENCOUNTER — Encounter (HOSPITAL_BASED_OUTPATIENT_CLINIC_OR_DEPARTMENT_OTHER): Payer: Self-pay | Admitting: Ophthalmology

## 2023-01-04 DIAGNOSIS — H029 Unspecified disorder of eyelid: Secondary | ICD-10-CM

## 2023-01-04 HISTORY — PX: EXC LESION EYELID W/O CLSR/W/SIMPLE DIR CLOSURE: OPH254

## 2023-01-04 MED ORDER — ERYTHROMYCIN 5 MG/GM OP OINT
TOPICAL_OINTMENT | OPHTHALMIC | 2 refills | Status: AC
Start: 2023-01-04 — End: 2023-02-01

## 2023-01-04 NOTE — Progress Notes (Signed)
Patient presents with:  Other: Patient with eyelid lesion, right lateral canthus, right lower eyelid laterally, for excisional biopsy.  The patient is having the following procedure today: eyelid lesion, right lower eyelid.   The risks, benefits and alternatives of the procedure have been discussed with the ophthalmologist, and the patient has had her questions answered satisfactorily.    Universal Protocol for Invasive Procedures     Name of Procedure: excsion of eyelid lesion, right lower eyer.     Pre- Procedure Verification Process:  A plan for performing the correct procedure, for the correct patient, at the correct site was verified. The patient was involved in the verification process. The Informed e-consent for Invasive Procedure form was signed by PATIENT and submitted into the patient's record. The proceduralist and other members of the clinical team reviewed relevant patient medical history, diagnostic test results, radiology test results and assembled all special equipment and supplies needed for this procedure. All elements of Pre- Procedure Verification Process were completed at 01/04/23 24 at 9:15 am(date and time).      Marking the Procedure Site:   Procedure site was marked per [facility] policy. Using a surgical marker, procedure site was marked at 01/04/23 at 9:15 am (date and time)       Time-Out:   Time-Out was initiated by me. Participating team members: N/A. All participating members of the team actively communicated and re-confirmed the following:   1) correct procedure, correct patient, correct side, correct site and correct position  2) completed informed consent  3) presence of all necessary equipment and supplies     Time-out was completed at 01/04/2023  9:17 AM     Surgeon: Cresenciano Lick, MD  Assistant: (nurse)  Shirlean Kelly, RN  Type of Anesthesia lidocaine 1%, with epi:1:200,000 times 0.1 cc    Findings: eyelid lesion    Estimated blood Loss: drop  Specimens Sent to Pathology: eyelid  lesion, laterally  Complications: none  Other (e.g. implants): None  Drawing made:  How did patient tolerate? well  When will patient follow up 5-7 days, for suture removal    Vital signs prior to the procedure: Blood Pressure 122/80 and Heart Rate 80 after the procedure: Blood Pressure: 123/81 and Hear Rate 79

## 2023-01-04 NOTE — Addendum Note (Signed)
Addended byShirlean Kelly on: 01/04/2023 09:50 AM     Modules accepted: Orders

## 2023-01-07 ENCOUNTER — Other Ambulatory Visit: Payer: Self-pay

## 2023-01-07 ENCOUNTER — Ambulatory Visit
Payer: No Typology Code available for payment source | Attending: Physician Assistant | Admitting: Rehabilitative and Restorative Service Providers"

## 2023-01-07 DIAGNOSIS — G8929 Other chronic pain: Secondary | ICD-10-CM | POA: Diagnosis present

## 2023-01-07 DIAGNOSIS — Z96652 Presence of left artificial knee joint: Secondary | ICD-10-CM | POA: Diagnosis present

## 2023-01-07 DIAGNOSIS — M25562 Pain in left knee: Secondary | ICD-10-CM | POA: Diagnosis present

## 2023-01-07 NOTE — Progress Notes (Signed)
Subjective:    The pain is still there at the base of my knee.     Objective:       01/07/23 1200   Language Information   Language Needs Met By: Telephone Interpreter   Precautions   Precautions No   Rehab Discipline   Rehab Discipline PT   Visit   Visit number 2   POC Due date 01/30/23   Time Calculation   Start Time 1233   Stop Time 1258   Time Calculation (min) 25 min   Ther Exercise   Exercise LAQ   Reps 10   Sets 2   Holds hold 5' with eccentric lower, RTB above ankles   Ther Exercise 2   Exercise standing HS curls   Reps 2 10   Sets 2 2   Holds 2 RTB above knees   Ther Exercise 3   Exercise 3 monster walks   Holds 3 x3 laps RTB above ankles   Patient Education   What was taught? added above HEP   Method Practice   Patient comprehension Yes         Assessment:    Pt demo good form with all newly introduced exercises with no worsening pain throughout tx. She benefits from demonstration and VC's, good carryover.     Plan:    Cont with 3 way hip, assisted squats with band above knees, balance (tandem and semi-tandem EO vs EC), staggered sit to stands, quad stretch.    Marney Setting, PT, Lic # [id number]

## 2023-01-12 ENCOUNTER — Ambulatory Visit: Payer: No Typology Code available for payment source | Attending: Ophthalmology | Admitting: Ophthalmology

## 2023-01-12 ENCOUNTER — Encounter (HOSPITAL_BASED_OUTPATIENT_CLINIC_OR_DEPARTMENT_OTHER): Payer: Self-pay | Admitting: Ophthalmology

## 2023-01-12 ENCOUNTER — Other Ambulatory Visit: Payer: Self-pay

## 2023-01-12 DIAGNOSIS — H269 Unspecified cataract: Secondary | ICD-10-CM | POA: Diagnosis not present

## 2023-01-12 DIAGNOSIS — R7303 Prediabetes: Secondary | ICD-10-CM | POA: Insufficient documentation

## 2023-01-12 DIAGNOSIS — H029 Unspecified disorder of eyelid: Secondary | ICD-10-CM | POA: Diagnosis not present

## 2023-01-12 NOTE — Progress Notes (Signed)
Here for 1 week F/U for S/P lesion removed from RLL,H/O Cataract,Presbyopia.   Vision is stable OU.   No pain.   Itchy OU.   No flashes/floaters.    Ocular meds   Erythromycin ung 2/day OD LD last night

## 2023-01-12 NOTE — Progress Notes (Signed)
Patient presents with:  Follow Up: Patient is one week s/p excision of benign lesion, right eye, lateral canthus, for suture removal.  The incision is healed,The sutures was cut and removed.  Use Ilotycin ointment OD< to corner of eye , BID, for 7 days.    The path is pending.    Follow up PRN, or one year.

## 2023-01-13 ENCOUNTER — Ambulatory Visit (HOSPITAL_BASED_OUTPATIENT_CLINIC_OR_DEPARTMENT_OTHER): Payer: No Typology Code available for payment source | Admitting: Rehabilitative and Restorative Service Providers"

## 2023-01-13 ENCOUNTER — Telehealth (HOSPITAL_BASED_OUTPATIENT_CLINIC_OR_DEPARTMENT_OTHER): Payer: Self-pay | Admitting: Rehabilitative and Restorative Service Providers"

## 2023-01-13 NOTE — Telephone Encounter (Signed)
Pt missed appointment, no answer.  Garwood Wentzell F Kanna Dafoe, PT, Lic # [id number]

## 2023-01-14 ENCOUNTER — Ambulatory Visit (HOSPITAL_BASED_OUTPATIENT_CLINIC_OR_DEPARTMENT_OTHER): Payer: No Typology Code available for payment source | Admitting: Rehabilitative and Restorative Service Providers"

## 2023-01-21 ENCOUNTER — Ambulatory Visit (HOSPITAL_BASED_OUTPATIENT_CLINIC_OR_DEPARTMENT_OTHER): Payer: No Typology Code available for payment source | Admitting: Rehabilitative and Restorative Service Providers"

## 2023-01-27 ENCOUNTER — Telehealth (HOSPITAL_BASED_OUTPATIENT_CLINIC_OR_DEPARTMENT_OTHER): Payer: Self-pay | Admitting: Orthopaedic Surgery

## 2023-01-27 NOTE — Telephone Encounter (Signed)
LVM for pt to cancel her appt on 7/5 with Dr. Jacquelin Hawking .  Please offer next available  Inter Id # [id number]

## 2023-01-28 ENCOUNTER — Ambulatory Visit: Payer: No Typology Code available for payment source | Admitting: Rehabilitative and Restorative Service Providers"

## 2023-01-28 ENCOUNTER — Encounter (HOSPITAL_BASED_OUTPATIENT_CLINIC_OR_DEPARTMENT_OTHER): Payer: Self-pay | Admitting: Rehabilitative and Restorative Service Providers"

## 2023-01-28 DIAGNOSIS — G8929 Other chronic pain: Secondary | ICD-10-CM

## 2023-01-28 NOTE — Progress Notes (Signed)
PT Discharge Summary     Pt no showed twice and cancelled once. PT spoke with her on the phone via interpretor today, she wishes to stop therapy and cont with her HEP on her own.      Marney Setting, PT, Lic # [id number]

## 2023-02-09 ENCOUNTER — Other Ambulatory Visit: Payer: Self-pay

## 2023-02-09 ENCOUNTER — Ambulatory Visit: Payer: No Typology Code available for payment source | Attending: Family Medicine | Admitting: Family Medicine

## 2023-02-09 VITALS — BP 127/82 | HR 90 | Temp 97.7°F | Wt 163.8 lb

## 2023-02-09 DIAGNOSIS — Z6833 Body mass index (BMI) 33.0-33.9, adult: Secondary | ICD-10-CM | POA: Diagnosis present

## 2023-02-09 DIAGNOSIS — E6609 Other obesity due to excess calories: Secondary | ICD-10-CM | POA: Diagnosis present

## 2023-02-09 DIAGNOSIS — G8929 Other chronic pain: Secondary | ICD-10-CM | POA: Diagnosis present

## 2023-02-09 DIAGNOSIS — N644 Mastodynia: Secondary | ICD-10-CM | POA: Diagnosis present

## 2023-02-09 DIAGNOSIS — R490 Dysphonia: Secondary | ICD-10-CM | POA: Diagnosis present

## 2023-02-09 DIAGNOSIS — I10 Essential (primary) hypertension: Secondary | ICD-10-CM | POA: Insufficient documentation

## 2023-02-09 DIAGNOSIS — Z5912 Inadequate housing utilities: Secondary | ICD-10-CM | POA: Diagnosis present

## 2023-02-09 DIAGNOSIS — R52 Pain, unspecified: Secondary | ICD-10-CM | POA: Insufficient documentation

## 2023-02-09 DIAGNOSIS — M25562 Pain in left knee: Secondary | ICD-10-CM | POA: Diagnosis present

## 2023-02-09 MED ORDER — PHENTERMINE HCL 15 MG PO CAPS
15.0000 mg | ORAL_CAPSULE | Freq: Every morning | ORAL | 1 refills | Status: DC
Start: 2023-02-09 — End: 2023-06-29

## 2023-02-09 NOTE — Progress Notes (Signed)
BP 127/82 (Site: RA, Position: Sitting, Cuff Size: Lrg)   Pulse 90   Temp 97.7 F (36.5 C) (Temporal)   Wt 74.3 kg (163 lb 12.8 oz)   LMP 07/11/2005   BMI 33.08 kg/m     S:    Stephanie [name] is a 62 year old female who presents with:    Hoarse    Injection made her feel terrible      Past Medical History:  02/12/2016: Chronic right shoulder pain  No date: Depression  03/20/2012: Diverticulosis of colon      Comment:  Noted on colonoscopy 6/13   08/16/2010: Elevated hemoglobin A1c      Comment:  HEMOGLOBIN A1C (%) Date Value 03/06/2015 5.6                05/20/2014 5.4 05/08/2012 6.0 (H)   No results found for:               POCA1C   01/04/2023: Eyelid lesion      Comment:  Right lower eyelid lesion  11/21/2012: Hyperopia with astigmatism and presbyopia  No date: Hypertension  11/21/2012: Immature cataract  01/21/2012: Impingement syndrome of right shoulder      Comment:  10/14: pt taking  [place] med (meloxicam 7.5 mg BID                and cyclobenzaprine); Will ask for refill once supply                finished; PCP ok with refill of flexeril on temporary                basis;   01/26/2008: Irritable bladder      Comment:  Seen by Dr Mindi Junker 5/09, no incontinence, likely                irritable bladder.  Trial of anticholinergic, refer to                urology, has appt Dr Ricki Miller 02/23/08  02/16/2005: Leiomyoma of uterus, unspecified      Comment:  Dr Mindi Junker hysterectomy November, 2006; cervix retained,               last pap 11/11 neg Repeat pap/HPV 07/2015   02/15/2014: Mass of hand  No date: OSA (obstructive sleep apnea)  07/30/2011: Other plastic surgery for unacceptable cosmetic appearance      Comment:  Breast augmentation scheduled 08/02/11;  Dr.  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    ROS: Per HPI. No reported headaches, weakness/falls, chest pain, shortness of breath, abdominal pain/nausea/vomiting, numbness/tingling, fevers or chills.     Medications, allergies, PMH and SH reviewed and updated as necessary in Epic.    O:  PHYSICAL EXAM:  BP 127/82 (Site: RA, Position: Sitting, Cuff Size: Lrg)   Pulse 90   Temp 97.7 F (36.5 C) (Temporal)   Wt 74.3 kg (163 lb 12.8 oz)   LMP 07/11/2005   BMI 33.08 kg/m   GEN: NAD  HEENT: MMM  SKIN: [facility]  CHEST: Breathing comfortably, CTAB  NEURO: No focal deficits noted, alert  MSK: Normal gait observed  PSYCH: Appears stated age, appropriate grooming and dress, appropriate affect and mood congruent    A/P:  Stephanie [name] is a 62 year old female presents with:    1. Inadequate housing utilities      2. Generalized body aches  Reviewed related to injections   Likely all GLP1 will be same for her  Did well on phentermine   Will try lower dose and see how goes    3. BMI 33.0-33.9,adult    4. Breast pain  Reviewed reschedule breast imaging and getting her mychart reset up    5. Hypertension, goal below 140/90  stable    6. Chronic pain of left knee  Doing much better - defer surgery     7. Hoarse  Reviewed likely allergy v URI   No red flags   new    We discussed the patient's current medications. The patient expressed understanding and no barriers to adherence were identified.    F/u as needed    Health Maintenance:  Reviewed HM      Suezanne Jacquet, MD

## 2023-02-15 ENCOUNTER — Other Ambulatory Visit (HOSPITAL_BASED_OUTPATIENT_CLINIC_OR_DEPARTMENT_OTHER): Payer: Self-pay | Admitting: Family Medicine

## 2023-02-15 DIAGNOSIS — Z1239 Encounter for other screening for malignant neoplasm of breast: Secondary | ICD-10-CM

## 2023-03-04 ENCOUNTER — Ambulatory Visit (HOSPITAL_BASED_OUTPATIENT_CLINIC_OR_DEPARTMENT_OTHER): Payer: No Typology Code available for payment source | Admitting: Orthopaedic Surgery

## 2023-03-18 ENCOUNTER — Emergency Department
Admission: EM | Admit: 2023-03-18 | Discharge: 2023-03-18 | Disposition: A | Discharge status: Home / Self Care | Payer: No Typology Code available for payment source | Attending: Emergency Medicine | Admitting: Emergency Medicine

## 2023-03-18 ENCOUNTER — Other Ambulatory Visit: Payer: Self-pay

## 2023-03-18 DIAGNOSIS — L509 Urticaria, unspecified: Secondary | ICD-10-CM | POA: Diagnosis present

## 2023-03-18 MED ORDER — LORATADINE 10 MG PO TABS
10.0000 mg | ORAL_TABLET | Freq: Every day | ORAL | 0 refills | Status: DC
Start: 2023-03-18 — End: 2023-03-28

## 2023-03-18 MED ORDER — PREDNISONE 50 MG PO TABS
60.00 mg | ORAL_TABLET | Freq: Once | ORAL | Status: AC
Start: 2023-03-18 — End: 2023-03-18
  Administered 2023-03-18: 60 mg via ORAL
  Filled 2023-03-18: qty 1

## 2023-03-18 MED ORDER — LORATADINE 10 MG PO TABS
10.00 mg | ORAL_TABLET | Freq: Once | ORAL | Status: AC
Start: 2023-03-18 — End: 2023-03-18
  Administered 2023-03-18: 10 mg via ORAL
  Filled 2023-03-18: qty 1

## 2023-03-18 MED ORDER — PREDNISONE 50 MG PO TABS
50.00 mg | ORAL_TABLET | Freq: Every day | ORAL | 0 refills | Status: AC
Start: 2023-03-18 — End: 2023-03-22

## 2023-03-18 NOTE — Narrator Note (Signed)
Patient Disposition  Patient education for diagnosis, medications, activity, diet and follow-up.  Patient left ED 6:04 AM.  Patient rep received written instructions.    Interpreter to provide instructions: Yes; Interpreter ID: [email or web address]      Patient belongings with patient: YES    Have all existing LDAs been addressed? No    Have all IV infusions been stopped? No    Destination: Discharged to home

## 2023-03-18 NOTE — ED Provider Notes (Signed)
[facility] EMERGENCY MEDICINE ATTENDING NOTE     History of Present Illness:    Stephanie [name] is a 62 year old female who came to the ED evaluation of itchy rash for the past 3 days.  Never suffered this before.  Cannot think of anything she may have done to cause it.  No change in soaps or detergents.  No travel.  Tried taking an allergy medication she got from a friend as well as some Benadryl last night, not helping any.  She has photos on her phone, left reduction worse yesterday, slowly improving.  Tried calling primary care, was told there is no appointment till October   Stephanie [name]   MRN: [id number]  PCP: Mercer Pod, MD    Arrived to the ED by: Walk-in    History provided by: The patient is in [place] interpreter       Physical Examination and Testing     ED Triage Vitals [03/18/23 0520]   ED Triage Vitals Brief Group      Temp 98.1 F      Pulse 77      Resp 18      BP 133/78      SpO2 98 %      Pain Score 3       No orders to display  Labs Reviewed - No data to display     Looks well, no distress.  Vital signs reviewed, afebrile.  Does have scattered urticaria on upper thighs and lower abdomen, some on her back.  Nothing in mucous membranes..          ED Course and Medical Decision Making     ED COURSE  Medications   predniSONE (DELTASONE) tablet 60 mg (60 mg Oral Given 03/18/23 0558)   loratadine (CLARITIN) tablet 10 mg (10 mg Oral Given 03/18/23 0558)          In summary, this is a 62 year old female with urticaia, unclear etiology. Will DC on loratadine daily and a short burst of prednisone. Encouraged her to keep taking photos, f/u with PCP.         Patient educated on their likely diagnosis and follow up care plan. Reasons to return to the Emergency Department were reviewed.    Disposition     Impression(s):  Urticaria    Condition: Stable    Disposition:  Discharge    Signed by Dr. Araceli Bouche. Channing Mutters, DO, FACEP  Emergency Medicine Attending  [facility]

## 2023-03-18 NOTE — ED Triage Note (Signed)
Pt presents to ed for complaints of rash x 3 days. Pt has superficial, red rash on lower abdomen, arms and upper thigh. Pt states it is better than three days ago with benadryl and allergy medicine.

## 2023-03-28 ENCOUNTER — Encounter (HOSPITAL_BASED_OUTPATIENT_CLINIC_OR_DEPARTMENT_OTHER): Payer: Self-pay | Admitting: Family Medicine

## 2023-03-28 ENCOUNTER — Ambulatory Visit: Payer: No Typology Code available for payment source | Attending: Dermatology | Admitting: Dermatology

## 2023-03-28 ENCOUNTER — Ambulatory Visit (HOSPITAL_BASED_OUTPATIENT_CLINIC_OR_DEPARTMENT_OTHER): Payer: No Typology Code available for payment source | Admitting: Family Medicine

## 2023-03-28 ENCOUNTER — Other Ambulatory Visit: Payer: Self-pay

## 2023-03-28 VITALS — BP 115/75 | HR 77 | Temp 97.4°F | Wt 156.2 lb

## 2023-03-28 DIAGNOSIS — R21 Rash and other nonspecific skin eruption: Secondary | ICD-10-CM | POA: Diagnosis present

## 2023-03-28 DIAGNOSIS — L509 Urticaria, unspecified: Secondary | ICD-10-CM | POA: Diagnosis present

## 2023-03-28 LAB — CBC, PLATELET & DIFFERENTIAL
ABSOLUTE BASO COUNT: 0 10*3/uL (ref 0.0–0.1)
ABSOLUTE EOSINOPHIL COUNT: 0.3 10*3/uL (ref 0.0–0.8)
ABSOLUTE IMM GRAN COUNT: 0.02 10*3/uL (ref 0.00–0.10)
ABSOLUTE LYMPH COUNT: 3.5 10*3/uL (ref 0.6–5.9)
ABSOLUTE MONO COUNT: 0.6 10*3/uL (ref 0.2–1.4)
ABSOLUTE NEUTROPHIL COUNT: 3.2 10*3/uL (ref 1.6–8.3)
ABSOLUTE NRBC COUNT: 0 10*3/uL (ref 0.0–0.0)
BASOPHIL %: 0.3 % (ref 0.0–1.2)
EOSINOPHIL %: 3.3 % (ref 0.0–7.0)
HEMATOCRIT: 41.1 % (ref 34.1–44.9)
HEMOGLOBIN: 13.1 g/dL (ref 11.2–15.7)
IMMATURE GRANULOCYTE %: 0.3 % (ref 0.0–1.0)
LYMPHOCYTE %: 46.3 % (ref 15.0–54.0)
MEAN CORP HGB CONC: 31.9 g/dL (ref 31.0–37.0)
MEAN CORPUSCULAR HGB: 30.7 pg (ref 26.0–34.0)
MEAN CORPUSCULAR VOL: 96.3 fl (ref 80.0–100.0)
MEAN PLATELET VOLUME: 11.7 fL (ref 8.7–12.5)
MONOCYTE %: 7.7 % (ref 4.0–13.0)
NEUTROPHIL %: 42.1 % (ref 40.0–75.0)
NRBC %: 0 % (ref 0.0–0.0)
PLATELET COUNT: 314 10*3/uL (ref 150–400)
RBC DISTRIBUTION WIDTH STD DEV: 48.7 fL — ABNORMAL HIGH (ref 35.1–46.3)
RED BLOOD CELL COUNT: 4.27 M/uL (ref 3.90–5.20)
WHITE BLOOD CELL COUNT: 7.5 10*3/uL (ref 4.0–11.0)

## 2023-03-28 MED ORDER — CETIRIZINE HCL 10 MG PO TABS
10.00 mg | ORAL_TABLET | Freq: Two times a day (BID) | ORAL | 0 refills | Status: AC
Start: 2023-03-28 — End: 2023-04-27

## 2023-03-28 MED ORDER — TRIAMCINOLONE ACETONIDE 0.1 % EX OINT
TOPICAL_OINTMENT | Freq: Two times a day (BID) | CUTANEOUS | 0 refills | Status: AC
Start: 2023-03-28 — End: 2023-06-16

## 2023-03-28 MED ORDER — FAMOTIDINE 20 MG PO TABS
20.00 mg | ORAL_TABLET | Freq: Two times a day (BID) | ORAL | 0 refills | Status: AC
Start: 2023-03-28 — End: 2023-04-27

## 2023-03-28 NOTE — Progress Notes (Signed)
CC: Urticaria   Subjective:   Stephanie [name] is a 62 year old female who presents for f/u of     Seen in ED 7/19 for urticaria was discharged w short burst pred    Still having wheals daily   Pictures shown on phone as well as seen scattered on inner arms and chest     When asked about changes to her life recently- has been on a diet with a nutritionist recently- has been eating very little- salads, a little meat, bits of carbs, but nothing different than her usual-     Possibly recent detergent change   Possible increased frequency of both eggs and sweet potatoes in the diet       Patient Active Problem List:     BMI 33.0-33.9,adult     Pure hypercholesterolemia     Family history of malignant neoplasm of gastrointestinal tract     Tubular adenoma of colon     Hyperopia with astigmatism and presbyopia     Immature cataract     Hypertension, goal below 140/90     Adenomatous polyp of colon     Squamous 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 eyes     Alcohol abuse     Depression, recurrent (HCC)     Trauma and stressor-related disorder     Nontraumatic complete tear of right rotator cuff     Localized osteoarthritis of left knee     Obesity (BMI 30-39.9)     Chronic pain of left knee     Closed fracture of left distal radius     Fatty liver     Prediabetes     Surgical menopause     Generalized anxiety disorder     Eyelid lesion    Past Surgical History:  No date: BREAST ENHANCEMENT SURGERY      Comment:  12/12 had bilateral mammoplasty and saline implants, had               2nd surgery 5/30 for revision  01/04/2023: EXC LESION EYELID W/O CLSR/W/SIMPLE DIR CLOSURE; Right      Comment:  s/p excision of eyelid lesion, right lower eyelid,                laterally, by VJP II  No date: MAMMAPLASTY AUGMENTATION W/PROSTHETIC IMPLANT  No date: MASTOPEXY  No date: OB ANTEPARTUM CARE CESAREAN DLVR & POSTPARTUM      Comment:  x3  No date: REDUCTION MAMMAPLASTY  No date: TOTAL ABDOMINAL HYSTERECT W/WO RMVL TUBE OVARY       Comment:  for fibroids, still has cervix  No date: TOTAL KNEE REPLACEMENT; Left      Comment:  2/23  Review of patient's family history indicates:  Problem: Cancer - Other      Relation: Father          Age of Onset: (Not Specified)          Comment: stomach  Problem: Heart      Relation: Brother          Age of Onset: (Not Specified)          Comment: died at 41 during valve replacement  Problem: Heart      Relation: Brother          Age of Onset: (Not Specified)          Comment: chestpain with neg w/u  Problem: Heart      Relation: Mother  Age of Onset: (Not Specified)          Comment: Died of MI age 19  Problem: Diabetes      Relation: Mother          Age of Onset: (Not Specified)    loratadine (CLARITIN) 10 MG tablet, Take 1 tablet by mouth in the morning for 14 days., Disp: 14 tablet, Rfl: 0  phentermine 15 MG capsule, Take 1 capsule by mouth every morning, Disp: 30 capsule, Rfl: 1  alendronate (FOSAMAX) 70 MG tablet, Take 70mg  every 7 days with 8 oz of water on empty stomach.  Do not take anything else by mouth and stay upright (do not lie down) for 30 min until 1st food of day., Disp: 13 tablet, Rfl: 4  meloxicam (MOBIC) 15 MG tablet, Take 1 tablet by mouth in the morning. Take with food., Disp: 30 tablet, Rfl: 1  losartan (COZAAR) 25 MG tablet, Take 1 tablet by mouth in the morning., Disp: 90 tablet, Rfl: 2  traZODone (DESYREL) 50 MG tablet, Take 1 tablet by mouth nightly, Disp: 90 tablet, Rfl: 2  DULoxetine (CYMBALTA) 60 MG capsule, Take 1 capsule by mouth in the morning. Stop taking duloxetine 30mg ., Disp: 90 capsule, Rfl: 3  Insulin Pen Needle 31G X 8 MM, Inject under the skin, Disp: 100 each, Rfl: 3  omeprazole (PRILOSEC) 40 MG capsule, Take 1 capsule by mouth in the morning., Disp: 30 capsule, Rfl: 10  Multiple Vitamin (MULTIVITAMIN) TABS, Take 1 tablet by mouth in the morning., Disp: , Rfl:   atorvastatin (LIPITOR) 40 MG tablet, Take 1 tablet by mouth in the morning., Disp: 90 tablet, Rfl:  3  Glucosamine 750 MG TABS, Take by mouth, Disp: , Rfl:   Omega-3 Fatty Acids (FISH OIL) 1000 MG CAPS, Take by mouth, Disp: , Rfl:   Turmeric 500 MG CAPS, Take by mouth, Disp: , Rfl:   dexamethasone (DECADRON) 4 MG tablet, Take 4 mg by mouth in the morning and 4 mg in the evening. Take with meals., Disp: , Rfl:     No current facility-administered medications for this visit.    Review of Patient's Allergies indicates:  No Known Allergies          Objective:   BP 115/75   Pulse 77   Temp 97.4 F (36.3 C) (Temporal)   Wt 70.9 kg (156 lb 3.2 oz)   LMP 07/11/2005   SpO2 97%   BMI 31.55 kg/m     Exam:   Gen- NAD, well appearing   HEENT- EOMI, PERRL, Neck is FROM, no visible thyromegaly   Resp- no resp distress.   Skin- scattered urticaria on visible skin    MSK- normal mobility no deformities  Neuro- AOx3, face symmetric speech fluent   Psych- normal mood and affect       Assessment/Plan  Problem List Items Addressed This Visit    None  Visit Diagnoses       Urticaria        Relevant Medications    cetirizine (ZYRTEC) 10 MG tablet    famotidine (PEPCID) 20 MG tablet    Other Relevant Orders    ALLERGEN PROFILE FOOD    ALLERGEN PROFILE AREA 1    CBC, PLATELET & DIFFERENTIAL    REFERRAL TO ALLERGY (EXT) (Completed)           1. Urticaria  Unexplained trigger, potential is detergent vs eggs vs sweet potato vs other; we will also escalate to dual histamine  blockade at increased dosing frequency of zyrtec and add pepcid, also allergen testing, cbc for Eos/Basos, and referral to allergy   - ALLERGEN PROFILE FOOD  - ALLERGEN PROFILE AREA 1  - CBC, PLATELET & DIFFERENTIAL  - REFERRAL TO ALLERGY (EXT)  - cetirizine (ZYRTEC) 10 MG tablet; Take 1 tablet by mouth in the morning and 1 tablet before bedtime.  Dispense: 60 tablet; Refill: 0  - famotidine (PEPCID) 20 MG tablet; Take 1 tablet by mouth in the morning and 1 tablet before bedtime.  Dispense: 60 tablet; Refill: 0    Orders Placed This Encounter      Allergen Profile  Food          Order Specific Question: Release result to patient via [employer]          Answer: Immediate [1]      Allergen Profile Area 1          Order Specific Question: Release result to patient via My[facility]          Answer: Immediate [1]      CBC, Platelet & Differential          Order Specific Question: Release result to patient via My[facility]          Answer: Immediate [1]      REFERRAL TO ALLERGY (EXT)          Referral Priority:Routine          Referral Type:Consult and Treat          Referral Reason:Specialty Services Required          Requested Specialty:Allergy          Number of Visits Requested:6      cetirizine (ZYRTEC) 10 MG tablet          Sig: Take 1 tablet by mouth in the morning and 1 tablet before bedtime.          Dispense:  60 tablet          Refill:  0      famotidine (PEPCID) 20 MG tablet          Sig: Take 1 tablet by mouth in the morning and 1 tablet before bedtime.          Dispense:  60 tablet          Refill:  0        We discussed the above issues and the importance of medication compliance.   The patient or their parent/guardian was ready to learn and no apparent learning barriers were identified. I explained the diagnosis and treatment plan, and they expressed understanding of the content.   Possible side effects of prescribed medication(s) were explained n/a.    I attempted to answer any questions regarding the diagnosis and the proposed treatment.    We discussed the patient's current medications. The patient or their parent expressed understanding and no barriers to adherence were identified.        follow up w allergy and if not improving w current mgmt     Orson Ape, MD

## 2023-03-28 NOTE — Progress Notes (Signed)
[facility]  [address], [place], [place] [address]    Dermatology Clinic Note    CC: rash  ?  HPI: Stephanie [name] is a 62 year old female     Has three week history of a widespread rash.   ?  ROS: negative, no other skin complaints.  Feels well, no systemic complaints.   ?  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     Diagnosis by: Avie Arenas, MD            [facility] #: [id number]        RECD: 11/22/22-0713  STATUS: SOUT  SP TYPE: SOFTTISSUE      COLL: 11/19/22-  SUB DR: Lucienne Minks MD     ENTERED:  11/22/22-0713     ORDERED:  [id number], HE     Performed at [facility], [facility]  [address], [place] [address] [phone]     >>FINAL DIAGNOSIS<<     CYST, LEFT TEMPLE, EXCISION:  -Epidermal inclusion cyst     Diagnosis by: Luanne Bras, MD     CLINICAL HISTORY     Clinical history:  SOFT TISSUE LESION TO LEFT TEMPLE     SPEC #: [id number]        RECD: 09/28/22-1345  STATUS: SOUT   SP TYPE: DERM            COLL: 09/27/22-   SUB DR: Marlowe Shores MD      ENTERED:  09/28/22-1345      ORDERED:  [id number], L1-L3/2, L4-L6/2      Performed at [facility], [facility]   [address] [place] [address] [phone]      >>FINAL DIAGNOSIS<<     SKIN, LEFT UPPER ARM, SHAVE BIOPSY:   Lentigo simplex.      Diagnosis by: Avie Arenas, MD      CLINICAL HISTORY      Preop diagnosis:   R/O ATYPICAL NEVUS      Surgical procedure:   SKIN SHAVE      GROSS DESCRIPTION      The specimen is received in formalin labeled with patient's name, unit   number and  designated " left arm" and consists of a 1.3 x 1.0 cm tan-brown,   variegated, finely granular, irregular skin shave with no identifiable   lesion on the skin surface. The specimen is inked, step sectioned, and   entirely submitted in two cassettes labeled as follows:   ?  PMH:  Patient Active Problem List:     BMI 33.0-33.9,adult     Pure hypercholesterolemia     Family history of malignant neoplasm of gastrointestinal tract     Tubular adenoma of colon     Hyperopia with astigmatism and presbyopia     Immature cataract     Hypertension, goal below 140/90     Adenomatous polyp of colon     Squamous blepharitis of upper and lower  eyelids of both eyes     Alcohol abuse     Depression, recurrent (HCC)     Trauma and stressor-related disorder     Nontraumatic complete tear of right rotator cuff     Localized osteoarthritis of left knee     Obesity (BMI 30-39.9)     Chronic pain of left knee     Closed fracture of left distal radius     Fatty liver     Prediabetes     Surgical menopause     Generalized anxiety disorder     Eyelid lesion    ?  Meds:  ?     Medication List            Accurate as of December 21, 2021  2:03 PM. If you have any questions, ask your nurse or doctor.                CONTINUE taking these medications      dexamethasone 4 MG tablet  Commonly known as: DECADRON     Fish Oil 1000 MG Caps     Glucosamine 750 MG Tabs     Insulin Pen Needle 31G X 8 MM Misc  Inject under the skin     liraglutide 18 MG/3ML pen injector  Commonly known as: VICTOZA  Start 0.6mg  x 1 wk, then 1.2mg  x 1 wk and then 1.8mg  x 1 wk     losartan 25 MG tablet  Commonly known as: COZAAR  Take 1 tablet by mouth in the morning.     omeprazole 40 MG capsule  Commonly known as: PriLOSEC  Take 1 capsule by mouth in the morning.     traZODone 50 MG tablet  Commonly known as: DESYREL  TAKE 1 TABLET BY MOUTH NIGHTLY     Turmeric 500 MG Caps            ?  All:  Review of Patient's Allergies indicates:  No Known Allergies?     SHx:  ?Social  History    Tobacco Use      Smoking status: Former        Packs/day: 0.00        Types: Cigarettes        Quit date: 07/27/2001        Years since quitting: 21.6        Passive exposure: Past      Smokeless tobacco: Never    Alcohol use: Yes      Alcohol/week: 6.0 standard drinks of alcohol      Types: 6 Cans of beer per week      Comment: 6 beers per week     Drug use: No       FHx:  Hx of melanoma - , hx of NMSC -  ?  Physical exam/Assessment/Plan:  Well appearing pt in NAD  Mood and affect are wnl  A skin examination was performed including head, eyes, nose,  lips,  neck, chest, abdomen, back, buttocks, bilateral upper extremities, bilateral lower extremities, hands, feet, fingers, toes.  Skin type: III    # Rash. Many nummular red patches on the trunk, back, extremities. Ddx includes secondary syphilis vs GA vs PR vs hypersensitivity vs urticaria.   Punch biopsy:   PUNCH BIOPSY PROCEDURE NOTE  -informed consent obtained, time out performed  -lesion was anesthetized with Lidocaine 2% w/ epi  -Punch biopsy performed in the following location(s): L breast  -wound closed and hemostasis achieved using simplex  interrupted sutures  -wound dressed using vaseline and a bandaid  -wound care instructions were given  -the specimen will be sent to pathology and the patient will be notified of the results    PATIENT/PROCEDURE VERIFICATION DOCUMENTATION    Correct patient: Yes  Correct procedure: Yes  Correct side, site, mark visible if applicable: Yes  Correct position: Yes  Special equipment/implant(s) present, if applicable: NA    Time-out completed, documented by provider doing procedure or designated team member:  Marlowe Shores, MD   [phone]     Suture removal to be scheduled in 14 days.   - Triamcinolone 0.1% ointment BID, 2 weeks on, 2 weeks off. Maximum use of two weeks per month. Steroid side effects discussed including skin atrophy, fragility, telangiectasias, striae, pigmentary changes.   - check treponemal  antibody      RTC: 62mo TBSE, in 2 weeks for s/r.   ?  ?  ?  Maudry Mayhew, MD  [facility]         CC: Mercer Pod, MD  [address]  [place] [address]

## 2023-03-30 ENCOUNTER — Ambulatory Visit: Payer: No Typology Code available for payment source | Attending: Dermatology

## 2023-03-30 ENCOUNTER — Other Ambulatory Visit (HOSPITAL_BASED_OUTPATIENT_CLINIC_OR_DEPARTMENT_OTHER): Payer: Self-pay | Admitting: Dermatology

## 2023-03-30 ENCOUNTER — Encounter (HOSPITAL_BASED_OUTPATIENT_CLINIC_OR_DEPARTMENT_OTHER): Payer: Self-pay | Admitting: Family Medicine

## 2023-03-30 ENCOUNTER — Telehealth (HOSPITAL_BASED_OUTPATIENT_CLINIC_OR_DEPARTMENT_OTHER): Payer: Self-pay | Admitting: Ambulatory Care

## 2023-03-30 DIAGNOSIS — R21 Rash and other nonspecific skin eruption: Secondary | ICD-10-CM

## 2023-03-30 NOTE — Telephone Encounter (Signed)
Appointment Request  (Newest Message First)  [name], Stephanie  Sms Nurses Pool1 hour ago (1:02 PM)     SR  I reached out to the patient with the help of interpreter. Patient said Dr.Yang told her to do lab work for syphilis but when patient went to [facility] lab order wasn't placed . So, patient is requesting for lab order and will call to make an appointment again.      Thanks,  SR          Discussed with Dr. Threasa Beards.  He added on Syphilis order to labs that have already been obtained.  I called the patient with the assistance of the [place], [place], [place], [phone].  Voice mail let for the patient to call back.

## 2023-03-31 LAB — TREPONEMA PALLIDUM AB IGG: TREPONEMA PALLIDUM AB IGG: NONREACTIVE

## 2023-03-31 LAB — ALLERGEN PROFILE AREA 1
D001 D PTERONYSSINUS: <0.1 kU/L
D002 D FARINAE MITE: <0.1 kU/L
E001 CAT DANDER: <0.1 kU/L
E005 DOG DANDER: <0.1 kU/L
E072 MOUSE URINE: <0.1 kU/L
G002 BERMUDA GRASS: <0.1 kU/L
G006 TIMOTHY GRASS: <0.1 kU/L
I006 COCKROACH GERMAN IGE: <0.1 kU/L
IGE TOTAL: 13 IU/mL (ref 6–495)
M001 PENICILLIUM CHRYSOGENUM: <0.1 kU/L
M002 CLADOSPORIUM HERBARUM: <0.1 kU/L
M003 ASPERGILLUS FUMIGATUS: <0.1 kU/L
M006 ALTERNARIA ALTERNATA: <0.1 kU/L
T001 MAPLE/BOX ELDER: <0.1 kU/L
T003 COMMON SILVER BIRCH: 0.15 kU/L — AB
T006 CEDAR MOUNTAIN: <0.1 kU/L
T007 OAK WHITE: 0.1 kU/L — AB
T008 ELM AMERICAN WHITE: <0.1 kU/L
T010 WALNUT POLLEN: <0.1 kU/L
T011 MAPLE LEAF SYCAMORE: <0.1 kU/L
T014 COTTONWOOD: <0.1 kU/L
T015 ASH WHITE: <0.1 kU/L
T070 WHITE MULBERRY: <0.1 kU/L
W001 RAGWEED SHORT/COMMON: <0.1 kU/L
W006 MUGWORT: <0.1 kU/L
W014 PIGWEED COMMON: <0.1 kU/L
W018 SHEEP SORREL: <0.1 kU/L

## 2023-03-31 LAB — ALLERGEN PROFILE FOOD
F001 EGG WHITE: <0.1 kU/L
F002 MILK COW: <0.1 kU/L
F003 CODFISH: <0.1 kU/L
F004 WHEAT: <0.1 kU/L
F008 CORN: <0.1 kU/L
F010 SESAME SEED: <0.1 kU/L
F013 PEANUT: <0.1 kU/L
F014 SOYBEAN: <0.1 kU/L
F024 SHRIMP: <0.1 kU/L
F207 CLAM: <0.1 kU/L
F256 WALNUT FOOD: <0.1 kU/L
F338 SCALLOP: <0.1 kU/L

## 2023-04-03 NOTE — Progress Notes (Signed)
Dear RN,    Please:    1. Create Telephone encounter for this patient.  2. 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 It is possible that her hives are in response to these two common trees pollen. No changes to her current plan, if the hives aren't better, we can consider sending her to Allergy (external referral, ENT/Allergy at [facility] does not see rashes) for consideration of further management.     Plan:  1. As above    2. Type of Outreach: 1 phone call and if unable to reach send letter    3. Document the conversation in the Telephone Encounter and send the encounter back to me to complete orders and any further necessary documentation.     Thank you,  Orson Ape, MD

## 2023-04-04 ENCOUNTER — Telehealth (HOSPITAL_BASED_OUTPATIENT_CLINIC_OR_DEPARTMENT_OTHER): Payer: Self-pay

## 2023-04-04 NOTE — Telephone Encounter (Addendum)
Patient notified via phone call about allergy test and recommendations as outlined below. She endorses allergy meds working fine and denies any skin issues        ----- Message from Orson Ape sent at 04/03/2023 12:32 PM EDT -----  Dear RN,    Please:    1. Create Telephone encounter for this patient.  2. 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 It is possible that her hives are in response to these two common trees pollen. No changes to her current plan, if the hives aren't better, we can consider sending her to Allergy (external referral, ENT/Allergy at [facility] does not see rashes) for consideration of further management.     Plan:  1. As above    2. Type of Outreach: 1 phone call and if unable to reach send letter    3. Document the conversation in the Telephone Encounter and send the encounter back to me to complete orders and any further necessary documentation.     Thank you,  Orson Ape, MD

## 2023-04-04 NOTE — Telephone Encounter (Signed)
Call to patient, lm to call clinic and speak with RN via portuguese interpreter ID [id number]

## 2023-04-04 NOTE — Telephone Encounter (Signed)
-----   Message from Marlowe Shores sent at 04/03/2023  7:52 PM EDT -----  Can you let the patient know her syphilis test was negative.     Maudry Mayhew, MD  [facility]

## 2023-04-05 NOTE — Telephone Encounter (Signed)
Second outreach to patient with portuguese interpreter [phone], lm to call clinic and speak with RN

## 2023-04-05 NOTE — Telephone Encounter (Signed)
Call back to patient with portuguese interpreter ID [id number], advised on results as per Dr Threasa Beards below. States understanding. Will be here on 04/12/23 for stitches removal appt with RN

## 2023-04-05 NOTE — Telephone Encounter (Signed)
Patient returning nurses call

## 2023-04-11 ENCOUNTER — Encounter (HOSPITAL_BASED_OUTPATIENT_CLINIC_OR_DEPARTMENT_OTHER): Payer: No Typology Code available for payment source

## 2023-04-11 LAB — SURGICAL PATH SPECIMEN DERM CLINIC

## 2023-04-12 ENCOUNTER — Encounter (HOSPITAL_BASED_OUTPATIENT_CLINIC_OR_DEPARTMENT_OTHER): Payer: No Typology Code available for payment source

## 2023-04-21 ENCOUNTER — Ambulatory Visit (HOSPITAL_BASED_OUTPATIENT_CLINIC_OR_DEPARTMENT_OTHER): Payer: No Typology Code available for payment source

## 2023-05-05 ENCOUNTER — Telehealth (HOSPITAL_BASED_OUTPATIENT_CLINIC_OR_DEPARTMENT_OTHER): Payer: Self-pay

## 2023-05-05 NOTE — Telephone Encounter (Signed)
Please call pt back.

## 2023-05-05 NOTE — Telephone Encounter (Signed)
Outreach #1    LVM for pt to call clinic

## 2023-05-05 NOTE — Telephone Encounter (Signed)
Outreach to pt w/ interpreter services [phone]    Pt states she still has the rash it has not got better. States it keeps coming back.     Pt states she has a nursing appt and wants to see if Dr Threasa Beards can discuss results at time of nursing appt. Pt states she was traveling in [place] but she is back so anytime is good to call. Will inform provider.

## 2023-05-05 NOTE — Telephone Encounter (Signed)
-----   Message from Marlowe Shores sent at 05/05/2023  2:16 PM EDT -----  Can you let the patient know I tried calling her multiple times without luck.    Can you ask if she still has her rash? Can you ask when is a good time for me to call her to discuss the results of her biopsy? Thanks!    Maudry Mayhew, MD  [facility]

## 2023-05-06 ENCOUNTER — Ambulatory Visit: Payer: No Typology Code available for payment source | Attending: Dermatology

## 2023-05-06 ENCOUNTER — Other Ambulatory Visit: Payer: Self-pay

## 2023-05-06 DIAGNOSIS — Z4802 Encounter for removal of sutures: Secondary | ICD-10-CM | POA: Diagnosis present

## 2023-05-06 NOTE — Progress Notes (Signed)
Pt presents to clinic today for suture removal   one suture visualized, located L breast   Site healing well - no erythema, drainage, swelling.   Suture removed without difficulty  Vaseline/bandaid applied to site  Advised pt to continue vaseline/bandaid dressings for another day or so until completely healed.

## 2023-05-10 ENCOUNTER — Telehealth (HOSPITAL_BASED_OUTPATIENT_CLINIC_OR_DEPARTMENT_OTHER): Payer: Self-pay | Admitting: Family Medicine

## 2023-05-10 NOTE — Telephone Encounter (Signed)
Awaiting pictures

## 2023-05-10 NOTE — Telephone Encounter (Signed)
[facility] - [address]    Stephanie [name] [id number], 62 year old, female,   Telephone Information:   Home Phone [phone]   Work Phone Not on file.   Mobile [phone]       Patient's Preferred Pharmacy:     [facility] [place], [place] - [address]  Phone: [phone] Fax: [phone]        [name] NUMBER: [phone]     Patient said she got a call from the nurse because the doctor wants to speak with her regarding her rash.

## 2023-05-10 NOTE — Telephone Encounter (Signed)
Returned pt's call regarding her ongoing rash  Spoke with pt   SITUATION:  Pt c.o rash of arms and abdomen-started  Sunday  Background:  Pt had h.o hives / referred to allergist by PCP  Per derm provider's office note:Rash. Many nummular red patches on the trunk, back, extremities.  Assessment:   Pt c.o non pruritic rash of arms and abdomen that started Sunday  Pt states they are similar to small hives she has had in past  Denies that this is a new rash   Pt states she ate pork Sunday and feels this may have caused rash  Pt denies hx of pork allergy  Pt taking zyrtec bid and has not used a topical agent  Pt states TAC cream "is not doing anything"  Pt states rash is like hives she has had in past  Denies any breathing problems or facial swelling  Pt is currently at work  Recommendation:  Pt will send pictures via my chart of derm issue  Pt will call specialty referral line to f/u with allergy testing-number provided  Pt will go to ED for facial swellling, difficulty breathing

## 2023-05-12 ENCOUNTER — Telehealth (HOSPITAL_BASED_OUTPATIENT_CLINIC_OR_DEPARTMENT_OTHER): Payer: Self-pay | Admitting: Dermatology

## 2023-05-12 DIAGNOSIS — R21 Rash and other nonspecific skin eruption: Secondary | ICD-10-CM

## 2023-05-12 MED ORDER — CETIRIZINE HCL 10 MG PO TABS
10.0000 mg | ORAL_TABLET | Freq: Every day | ORAL | 2 refills | Status: DC
Start: 2023-05-12 — End: 2023-07-22

## 2023-05-12 NOTE — Telephone Encounter (Signed)
Telephone Call regarding culture/labs/biopsy.  ?Patient informed of results shown below.  ??  I spoke to the patient directly and discussed the significance of the diagnosis in detail. All of the patient's questions were answered.      Informed her that the biopsy results were nonspecific, but could be c/w urticaria or bullous pemphigoid.   Will start cetirizine 10mg  QD. Will also obtain labs: CBC with diff, TSH, ESR and BP antibodies. I provided her the lab scheduling number.   ?  ?Maudry Mayhew, MD   [facility]   ?  ?  Results:    SPEC #: [id number]        RECD: 03/28/23-2307  STATUS: SOUT  SP TYPE: DERM            COLL: 03/28/23-  SUB DR: Marlowe Shores MD     ENTERED:  03/28/23-2308     ORDERED:  [id number], [id number], [id number], HE, L1, L2, L3, PAS-Fungus     Performed at [facility], [facility]  [address] [place] [address] [phone]     >>FINAL DIAGNOSIS<<     A. SKIN, LEFT BREAST, PUNCH BIOPSY:  - Dermal edema, extravasated erythrocytes and a superficial perivascular  lymphocytic infiltrate with neutrophils and conspicuous eosinophils (see  note).     Note: Multiple levels examined. The findings are not entirely specific, but  may be compatible with the clinical impression of  urticaria/hypersensitivity reaction. No fungal organisms are identified  with a provided PASD stain. A spirochete stain is negative for T. pallidum.  Clinicopathologic correlation is advised.     After review of the entire case, Denman George. Cornejo MD of [facility] has rendered the above diagnosis, with which I concur.     Diagnosis by: Dola Factor, MD

## 2023-05-16 ENCOUNTER — Other Ambulatory Visit: Payer: Self-pay

## 2023-05-16 ENCOUNTER — Ambulatory Visit
Admission: RE | Admit: 2023-05-16 | Discharge: 2023-05-16 | Disposition: A | Discharge status: Home / Self Care | Payer: No Typology Code available for payment source | Attending: Dermatology | Admitting: Dermatology

## 2023-05-16 DIAGNOSIS — R21 Rash and other nonspecific skin eruption: Secondary | ICD-10-CM | POA: Insufficient documentation

## 2023-05-16 LAB — CBC, PLATELET & DIFFERENTIAL
ABSOLUTE BASO COUNT: 0 10*3/uL (ref 0.0–0.1)
ABSOLUTE EOSINOPHIL COUNT: 0.2 10*3/uL (ref 0.0–0.8)
ABSOLUTE IMM GRAN COUNT: 0.02 10*3/uL (ref 0.00–0.10)
ABSOLUTE LYMPH COUNT: 4 10*3/uL (ref 0.6–5.9)
ABSOLUTE MONO COUNT: 0.5 10*3/uL (ref 0.2–1.4)
ABSOLUTE NEUTROPHIL COUNT: 4 10*3/uL (ref 1.6–8.3)
ABSOLUTE NRBC COUNT: 0 10*3/uL (ref 0.0–0.0)
BASOPHIL %: 0.3 % (ref 0.0–1.2)
EOSINOPHIL %: 2.8 % (ref 0.0–7.0)
HEMATOCRIT: 40.9 % (ref 34.1–44.9)
HEMOGLOBIN: 13.4 g/dL (ref 11.2–15.7)
IMMATURE GRANULOCYTE %: 0.2 % (ref 0.0–1.0)
LYMPHOCYTE %: 45.6 % (ref 15.0–54.0)
MEAN CORP HGB CONC: 32.8 g/dL (ref 31.0–37.0)
MEAN CORPUSCULAR HGB: 30.9 pg (ref 26.0–34.0)
MEAN CORPUSCULAR VOL: 94.5 fl (ref 80.0–100.0)
MEAN PLATELET VOLUME: 11.3 fL (ref 8.7–12.5)
MONOCYTE %: 5.4 % (ref 4.0–13.0)
NEUTROPHIL %: 45.7 % (ref 40.0–75.0)
NRBC %: 0 % (ref 0.0–0.0)
PLATELET COUNT: 311 10*3/uL (ref 150–400)
RBC DISTRIBUTION WIDTH STD DEV: 48.9 fL — ABNORMAL HIGH (ref 35.1–46.3)
RED BLOOD CELL COUNT: 4.33 M/uL (ref 3.90–5.20)
WHITE BLOOD CELL COUNT: 8.7 10*3/uL (ref 4.0–11.0)

## 2023-05-16 LAB — RBC SEDIMENTATION RATE: RBC SEDIMENTATION RATE: 12 MM/HR (ref 0–30)

## 2023-05-16 LAB — THYROID SCREEN TSH REFLEX FT4: THYROID SCREEN TSH REFLEX FT4: 0.778 u[IU]/mL (ref 0.270–4.200)

## 2023-05-19 NOTE — Telephone Encounter (Signed)
Dr Threasa Beards spoke with pt 9/12

## 2023-05-20 ENCOUNTER — Encounter (HOSPITAL_BASED_OUTPATIENT_CLINIC_OR_DEPARTMENT_OTHER): Payer: Self-pay | Admitting: Family Medicine

## 2023-05-20 LAB — BULLOUS PEMPHIGOID ANTIBODIES
BULLOUS PEMPHIGOID 180 AB: <2 RU/mL (ref <20)
BULLOUS PEMPHIGOID 230 AB: <2 RU/mL (ref <20)

## 2023-05-23 NOTE — Telephone Encounter (Signed)
Called patient via telephone interpreter (portuguese). Dr. Renata Caprice recommendations reviewed with patient as noted below. Patient confirmed understanding and had no further questions.     Please advise her that her rash looks like hives, she can increase zyrtec to 10mg  twice a day.    Thanks!  Saddie Benders

## 2023-05-23 NOTE — Telephone Encounter (Signed)
Called patient via telephone interpreter (Portuguese Hermine Messick).   S- Hives "allergic reaction"   B- c/o hives - saw Dr. Threasa Beards for hives 2 weeks ago and was prescribed Zyrtec with some relief but patient reports "still bothering her"  A- Itchy rash has improved recently but not completely resolved. Patient still taking Zyrtec once daily. No other remedies utilized. Denies CP, SOB, difficulty swallowing. Speech is clear and patient speaking in full sentences with no audible wheezes.   R- Patient scheduled for follow up with Dr. Threasa Beards in November but is asking for sooner appt or additional medication/ treatment recommendation in the meantime.

## 2023-05-24 ENCOUNTER — Encounter (HOSPITAL_BASED_OUTPATIENT_CLINIC_OR_DEPARTMENT_OTHER): Payer: Self-pay | Admitting: Physician Assistant

## 2023-05-24 ENCOUNTER — Telehealth (HOSPITAL_BASED_OUTPATIENT_CLINIC_OR_DEPARTMENT_OTHER): Payer: Self-pay

## 2023-05-24 ENCOUNTER — Encounter (HOSPITAL_BASED_OUTPATIENT_CLINIC_OR_DEPARTMENT_OTHER): Payer: Self-pay | Admitting: Family Medicine

## 2023-05-24 ENCOUNTER — Encounter (HOSPITAL_BASED_OUTPATIENT_CLINIC_OR_DEPARTMENT_OTHER): Payer: Self-pay | Admitting: Dermatology

## 2023-05-24 NOTE — Telephone Encounter (Signed)
-----   Message from Marlowe Shores sent at 05/24/2023  2:46 PM EDT -----  Can you let the patient know the labs I drew were reassuring.     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 Please advise her to take zyrtec 10mg  twice a day as previously instructed. Chronic hives can last quite a while but usually does go away on its own, but she should keep on taking oral antihistamines on a consistent, daily basis. I will reassess this at our next visit in Nov.     Maudry Mayhew, MD  [facility]

## 2023-05-24 NOTE — Telephone Encounter (Signed)
Call to the patient with [facility] interpreter ID [id number], lm to call clinic and speak with RN

## 2023-05-25 NOTE — Telephone Encounter (Signed)
Pharmacy has refill

## 2023-05-25 NOTE — Telephone Encounter (Signed)
Pharmacy has refills but too soon

## 2023-05-25 NOTE — Telephone Encounter (Signed)
Patient returning the nurse's call. Please call with the interpreter.    [email or web address]

## 2023-05-25 NOTE — Telephone Encounter (Signed)
I called the patient with the assistance of the [place], [place], ID#, [id number].  Voice mail left for the patient to call us back.

## 2023-05-25 NOTE — Telephone Encounter (Signed)
Message from Marlowe Shores sent at 05/24/2023  2:46 PM EDT -----  Can you let the patient know the labs I drew were reassuring.      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 Please advise her to take zyrtec 10mg  twice a day as previously instructed. Chronic hives can last quite a while but usually does go away on its own, but she should keep on taking oral antihistamines on a consistent, daily basis. I will reassess this at our next visit in Nov.      Maudry Mayhew, MD  [facility]                 I called the patient with the assistance of the [place], [place], ID#, [id number].  The above discussed as put forth by Dr. Threasa Beards.  She will purchase the Zyrtec and take as directed.  She inquired about allergies and allergy testing as family members have allergies.  She has a f/u in November with Dr. Threasa Beards and he will review plan of care with her.

## 2023-05-25 NOTE — Telephone Encounter (Signed)
[facility] - [address]    Stephanie [name] [id number], 63 year old, female,   Telephone Information:   Home Phone [phone]   Work Phone Not on file.   Mobile [phone]     CONFIRMED TODAY: Yes    CALL BACK NUMBER: [phone]  Best time to call back: anytime  Cell phone:   Other phone:    Available times:    Patient's language of care: [place] ([place]    Patient needs a [place] interpreter.    Patient's PCP: Mercer Pod, MD    Person calling on behalf of patient: Patient (self)    Calls today Returning phone call.

## 2023-05-26 ENCOUNTER — Ambulatory Visit (HOSPITAL_BASED_OUTPATIENT_CLINIC_OR_DEPARTMENT_OTHER): Payer: No Typology Code available for payment source | Admitting: Physician Assistant

## 2023-06-03 ENCOUNTER — Telehealth (HOSPITAL_BASED_OUTPATIENT_CLINIC_OR_DEPARTMENT_OTHER): Payer: Self-pay

## 2023-06-03 NOTE — Telephone Encounter (Signed)
Pt calls because although she has been taking Zyrtec twice daily she continues to have hives and itchiness    Areas effected are her inner arms and her trunk    Asked if she could send photos she said the  rash looks the same  As previous photos when it is at the worst      She takes 1st Zyrtec at 5:30 am  Doesn't like that it makes her feel sluggish  Hives and itch does go away for 5-6 hrs the returns    Takes second Zyrtec at 5:30 pm  Wakes up 2 -3 x during the night due to itch    Next appt with Dr Threasa Beards 07/08/23

## 2023-06-03 NOTE — Telephone Encounter (Signed)
[facility] - [address]    Dulcy Sida [id number], 62 year old, female,   Telephone Information:   Home Phone [phone]   Work Phone Not on file.   Mobile [phone]     CONFIRMED TODAY: Yes    CALL BACK NUMBER: [phone]     Best time to call back: Anytime    Patient's language of care: [place] ([place])    Patient needs a [place] interpreter.    Patient's PCP: Mercer Pod, MD    Person calling on behalf of patient: Patient (self)    Calls today with questions and concerns.  Patient of Dr.Yang, called today and stated that she her rash never got better. The medication that Dr.Yang prescribed never helped her. Patient wants to talk to Dr.Yang.

## 2023-06-08 ENCOUNTER — Ambulatory Visit: Payer: No Typology Code available for payment source | Admitting: Family Medicine

## 2023-06-13 ENCOUNTER — Encounter (HOSPITAL_BASED_OUTPATIENT_CLINIC_OR_DEPARTMENT_OTHER): Payer: Self-pay | Admitting: Family Medicine

## 2023-06-14 NOTE — PostOp Call (Signed)
Dr Threasa Beards     Addendum to 10/4 /24 messagr    Pt has sent recent photos to her PCP    Please review photos

## 2023-06-20 NOTE — Telephone Encounter (Signed)
The patient has an appointment on 07/08/2023 per Dr. Renata Caprice request.

## 2023-06-25 ENCOUNTER — Encounter (HOSPITAL_BASED_OUTPATIENT_CLINIC_OR_DEPARTMENT_OTHER): Payer: Self-pay

## 2023-06-25 ENCOUNTER — Encounter (HOSPITAL_BASED_OUTPATIENT_CLINIC_OR_DEPARTMENT_OTHER): Payer: Self-pay | Admitting: Family Medicine

## 2023-06-25 DIAGNOSIS — Z6833 Body mass index (BMI) 33.0-33.9, adult: Secondary | ICD-10-CM

## 2023-06-25 DIAGNOSIS — G8929 Other chronic pain: Secondary | ICD-10-CM

## 2023-06-25 MED ORDER — LOSARTAN POTASSIUM 25 MG PO TABS
25.0000 mg | ORAL_TABLET | Freq: Every day | ORAL | 3 refills | Status: DC
Start: 2023-06-25 — End: 2024-06-20

## 2023-06-25 NOTE — Telephone Encounter (Signed)
This prescription refill request has passed the Rx Renewal Authorization protocol.  This medication has been approved and sent to the patient's preferred pharmacy.      PER Pharmacy, Stephanie [name] is a 62 year old female has requested a refill of Losartan.      Last Office Visit:  03/28/2023 with  MD  Last Physical Exam:  02/21/2013    There are no preventive care reminders to display for this patient.    Other Med Adult:  Most Recent BP Reading(s)  03/28/23 : 115/75        Cholesterol (mg/dL)   Date Value   91/47/8295 237     LOW DENSITY LIPOPROTEIN DIRECT (mg/dL)   Date Value   62/13/0865 135     HIGH DENSITY LIPOPROTEIN (mg/dL)   Date Value   78/46/9629 49     TRIGLYCERIDES (mg/dL)   Date Value   52/84/1324 254 (H)         THYROID SCREEN TSH REFLEX FT4 (uIU/mL)   Date Value   05/16/2023 0.778         TSH (THYROID STIM HORMONE) (uIU/mL)   Date Value   12/10/2016 0.790       HEMOGLOBIN A1C (%)   Date Value   04/17/2020 6.2 (H)       No results found for: "POCA1C"      INR (no units)   Date Value   08/03/2021 1.1   04/22/2008 1.0 (L)   02/07/2005 1.0 (L)       SODIUM (mmol/L)   Date Value   10/04/2022 140       POTASSIUM (mmol/L)   Date Value   10/04/2022 4.4           CREATININE (mg/dL)   Date Value   40/05/2724 0.6       Documented patient preferred pharmacies:    [facility], [place] - [address]  Phone: [phone] Fax: [phone]

## 2023-06-26 ENCOUNTER — Encounter (HOSPITAL_BASED_OUTPATIENT_CLINIC_OR_DEPARTMENT_OTHER): Payer: Self-pay | Admitting: Dermatology

## 2023-06-26 ENCOUNTER — Encounter (HOSPITAL_BASED_OUTPATIENT_CLINIC_OR_DEPARTMENT_OTHER): Payer: Self-pay | Admitting: Family Medicine

## 2023-06-27 ENCOUNTER — Ambulatory Visit (HOSPITAL_BASED_OUTPATIENT_CLINIC_OR_DEPARTMENT_OTHER): Payer: No Typology Code available for payment source

## 2023-06-27 ENCOUNTER — Encounter (HOSPITAL_BASED_OUTPATIENT_CLINIC_OR_DEPARTMENT_OTHER): Payer: Self-pay | Admitting: Family Medicine

## 2023-06-27 ENCOUNTER — Encounter (HOSPITAL_BASED_OUTPATIENT_CLINIC_OR_DEPARTMENT_OTHER): Payer: Self-pay

## 2023-06-27 DIAGNOSIS — F5104 Psychophysiologic insomnia: Secondary | ICD-10-CM

## 2023-06-27 MED ORDER — OMEPRAZOLE 40 MG PO CPDR
40.0000 mg | DELAYED_RELEASE_CAPSULE | Freq: Every day | ORAL | 3 refills | Status: DC
Start: 2023-06-27 — End: 2024-06-20

## 2023-06-27 MED ORDER — ALENDRONATE SODIUM 70 MG PO TABS
ORAL_TABLET | ORAL | 4 refills | Status: DC
Start: 2023-06-27 — End: 2024-06-20

## 2023-06-27 NOTE — Telephone Encounter (Signed)
PER Pharmacy, Stephanie [name] is a 62 year old female has requested a refill of      -  alendronate        Last Office Visit: 4 26 24  with von deck   Last Physical Exam: 6 25 14       There are no preventive care reminders to display for this patient.     Other Med Adult:  Most Recent BP Reading(s)  03/28/23 : 115/75        Cholesterol (mg/dL)   Date Value   91/47/8295 237     LOW DENSITY LIPOPROTEIN DIRECT (mg/dL)   Date Value   62/13/0865 135     HIGH DENSITY LIPOPROTEIN (mg/dL)   Date Value   78/46/9629 49     TRIGLYCERIDES (mg/dL)   Date Value   52/84/1324 254 (H)         THYROID SCREEN TSH REFLEX FT4 (uIU/mL)   Date Value   05/16/2023 0.778         TSH (THYROID STIM HORMONE) (uIU/mL)   Date Value   12/10/2016 0.790       HEMOGLOBIN A1C (%)   Date Value   04/17/2020 6.2 (H)       No results found for: "POCA1C"      INR (no units)   Date Value   08/03/2021 1.1   04/22/2008 1.0 (L)   02/07/2005 1.0 (L)       SODIUM (mmol/L)   Date Value   10/04/2022 140       POTASSIUM (mmol/L)   Date Value   10/04/2022 4.4           CREATININE (mg/dL)   Date Value   40/05/2724 0.6        Documented patient preferred pharmacies:    [facility],  - [address]  Phone: [phone] Fax: [phone]

## 2023-06-27 NOTE — Telephone Encounter (Signed)
PER Patient (self), Stephanie [name] is a 62 year old female has requested a refill of     omeprazole.    Last OFFICE/TELE Visit:  02/09/2023  PC P     Last Physical Exam: 02/21/2013     There are no preventive care reminders to display for this patient.    Other Med Adult:  Most Recent BP Reading(s)  03/28/23 : 115/75        Cholesterol (mg/dL)   Date Value   51/88/4166 237     LOW DENSITY LIPOPROTEIN DIRECT (mg/dL)   Date Value   02/27/1600 135     HIGH DENSITY LIPOPROTEIN (mg/dL)   Date Value   09/32/3557 49     TRIGLYCERIDES (mg/dL)   Date Value   32/20/2542 254 (H)         THYROID SCREEN TSH REFLEX FT4 (uIU/mL)   Date Value   05/16/2023 0.778         TSH (THYROID STIM HORMONE) (uIU/mL)   Date Value   12/10/2016 0.790       HEMOGLOBIN A1C (%)   Date Value   04/17/2020 6.2 (H)       No results found for: "POCA1C"      INR (no units)   Date Value   08/03/2021 1.1   04/22/2008 1.0 (L)   02/07/2005 1.0 (L)       SODIUM (mmol/L)   Date Value   10/04/2022 140       POTASSIUM (mmol/L)   Date Value   10/04/2022 4.4           CREATININE (mg/dL)   Date Value   70/62/3762 0.6       Documented patient preferred pharmacies:    [facility], [place] - [address]  Phone: [phone] Fax: [phone]

## 2023-06-28 ENCOUNTER — Encounter (HOSPITAL_BASED_OUTPATIENT_CLINIC_OR_DEPARTMENT_OTHER): Payer: Self-pay | Admitting: Family Medicine

## 2023-06-28 NOTE — Telephone Encounter (Signed)
PER Patient (self), Stephanie [name] is a 62 year old female has requested a refill of      -  traZODone        Last Office Visit: 7 29 24   with segura   Last Physical Exam: 6 25 14       There are no preventive care reminders to display for this patient.     Other Med Adult:  Most Recent BP Reading(s)  03/28/23 : 115/75        Cholesterol (mg/dL)   Date Value   85/63/1497 237     LOW DENSITY LIPOPROTEIN DIRECT (mg/dL)   Date Value   02/63/7858 135     HIGH DENSITY LIPOPROTEIN (mg/dL)   Date Value   85/10/7739 49     TRIGLYCERIDES (mg/dL)   Date Value   28/78/6767 254 (H)         THYROID SCREEN TSH REFLEX FT4 (uIU/mL)   Date Value   05/16/2023 0.778         TSH (THYROID STIM HORMONE) (uIU/mL)   Date Value   12/10/2016 0.790       HEMOGLOBIN A1C (%)   Date Value   04/17/2020 6.2 (H)       No results found for: "POCA1C"      INR (no units)   Date Value   08/03/2021 1.1   04/22/2008 1.0 (L)   02/07/2005 1.0 (L)       SODIUM (mmol/L)   Date Value   10/04/2022 140       POTASSIUM (mmol/L)   Date Value   10/04/2022 4.4           CREATININE (mg/dL)   Date Value   20/94/7096 0.6        Documented patient preferred pharmacies:    [facility], [place] - [address]  Phone: [phone] Fax: [phone]

## 2023-06-29 ENCOUNTER — Ambulatory Visit: Payer: No Typology Code available for payment source | Attending: Family Medicine | Admitting: Family Medicine

## 2023-06-29 DIAGNOSIS — E669 Obesity, unspecified: Secondary | ICD-10-CM | POA: Insufficient documentation

## 2023-06-29 DIAGNOSIS — L509 Urticaria, unspecified: Secondary | ICD-10-CM | POA: Insufficient documentation

## 2023-06-29 MED ORDER — TRAZODONE HCL 50 MG PO TABS
50.0000 mg | ORAL_TABLET | Freq: Every evening | ORAL | 2 refills | Status: DC
Start: 2023-06-29 — End: 2024-04-02

## 2023-06-29 NOTE — Progress Notes (Signed)
Clinic Note:    SUBJECTIVE:  Stephanie [name] is a 62 year old female with the following active problem list:    Patient Active Problem List:     BMI 33.0-33.9,adult     Pure hypercholesterolemia     Family history of malignant neoplasm of gastrointestinal tract     Tubular adenoma of colon     Hyperopia with astigmatism and presbyopia     Immature cataract     Hypertension, goal below 140/90     Adenomatous polyp of colon     Squamous 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 eyes     Alcohol abuse     Depression, recurrent (HCC)     Trauma and stressor-related disorder     Nontraumatic complete tear of right rotator cuff     Localized osteoarthritis of left knee     Obesity (BMI 30-39.9)     Chronic pain of left knee     Closed fracture of left distal radius     Fatty liver     Prediabetes     Surgical menopause     Generalized anxiety disorder     Eyelid lesion      CC:     1) urticaria  -wanting to see allergy, referred to allergy   -itching and after the shower, things are worse   -cetrizine     Past Medical History:  02/12/2016: Chronic right shoulder pain  No date: Depression  03/20/2012: Diverticulosis of colon      Comment:  Noted on colonoscopy 6/13   08/16/2010: Elevated hemoglobin A1c      Comment:  HEMOGLOBIN A1C (%) Date Value 03/06/2015 5.6                05/20/2014 5.4 05/08/2012 6.0 (H)   No results found for:               POCA1C   01/04/2023: Eyelid lesion      Comment:  Right lower eyelid lesion  11/21/2012: Hyperopia with astigmatism and presbyopia  No date: Hypertension  11/21/2012: Immature cataract  01/21/2012: Impingement syndrome of right shoulder      Comment:  10/14: pt taking  [place] med (meloxicam 7.5 mg BID                and cyclobenzaprine); Will ask for refill once supply                finished; PCP ok with refill of flexeril on temporary                basis;   01/26/2008: Irritable bladder      Comment:  Seen by Dr Mindi Junker 5/09, no incontinence, likely                irritable  bladder.  Trial of anticholinergic, refer to                urology, has appt Dr Ricki Miller 02/23/08  02/16/2005: Leiomyoma of uterus, unspecified      Comment:  Dr Mindi Junker hysterectomy November, 2006; cervix retained,               last pap 11/11 neg Repeat pap/HPV 07/2015   02/15/2014: Mass of hand  No date: OSA (obstructive sleep apnea)  07/30/2011: Other plastic surgery for unacceptable cosmetic appearance      Comment:  Breast augmentation scheduled 08/02/11;  Dr.  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  08/13/2016: Squamous 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   eyes  06/18/2004: Urinary calculus, unspecified    Past Surgical History:  No date: BREAST ENHANCEMENT SURGERY      Comment:  12/12 had bilateral mammoplasty and saline implants, had               2nd surgery 5/30 for revision  01/04/2023: EXC LESION EYELID W/O CLSR/W/SIMPLE DIR CLOSURE; Right      Comment:  s/p excision of eyelid lesion, right lower eyelid,                laterally, by VJP II  No date: MAMMAPLASTY AUGMENTATION W/PROSTHETIC IMPLANT  No date: MASTOPEXY  No date: OB ANTEPARTUM CARE CESAREAN DLVR & POSTPARTUM      Comment:  x3  No date: REDUCTION MAMMAPLASTY  No date: TOTAL ABDOMINAL HYSTERECT W/WO RMVL TUBE OVARY      Comment:  for fibroids, still has cervix  No date: TOTAL KNEE REPLACEMENT; Left      Comment:  2/23    traZODone (DESYREL) 50 MG tablet, Take 1 tablet by mouth nightly, Disp: 90 tablet, Rfl: 2  omeprazole (PRILOSEC) 40 MG capsule, Take 1 capsule by mouth daily, Disp: 90 capsule, Rfl: 3  alendronate (FOSAMAX) 70 MG tablet, Take 70mg  every 7 days with 8 oz of water on empty stomach.  Do not take anything else by mouth and stay upright (do not lie down) for 30 min until 1st food of day., Disp: 13 tablet, Rfl:  4  losartan (COZAAR) 25 MG tablet, Take 1 tablet by mouth daily, Disp: 90 tablet, Rfl: 3  cetirizine (ZYRTEC) 10 MG tablet, Take 1 tablet by mouth in the morning., Disp: 30 tablet, Rfl: 2  phentermine 15 MG capsule, Take 1 capsule by mouth every morning, Disp: 30 capsule, Rfl: 1  meloxicam (MOBIC) 15 MG tablet, Take 1 tablet by mouth in the morning. Take with food., Disp: 30 tablet, Rfl: 1  DULoxetine (CYMBALTA) 60 MG capsule, Take 1 capsule by mouth in the morning. Stop taking duloxetine 30mg ., Disp: 90 capsule, Rfl: 3  Multiple Vitamin (MULTIVITAMIN) TABS, Take 1 tablet by mouth in the morning., Disp: , Rfl:   atorvastatin (LIPITOR) 40 MG tablet, Take 1 tablet by mouth in the morning., Disp: 90 tablet, Rfl: 3  Glucosamine 750 MG TABS, Take by mouth, Disp: , Rfl:   Omega-3 Fatty Acids (FISH OIL) 1000 MG CAPS, Take by mouth, Disp: , Rfl:   Turmeric 500 MG CAPS, Take by mouth, Disp: , Rfl:   dexamethasone (DECADRON) 4 MG tablet, Take 4 mg by mouth in the morning and 4 mg in the evening. Take with meals., Disp: , Rfl:     No current facility-administered medications on file prior to visit.      Review of Patient's Allergies indicates:  No Known Allergies    Review of patient's family history indicates:  Problem: Cancer - Other      Relation: Father          Age of Onset: (Not Specified)          Comment: stomach  Problem: Heart      Relation: Brother          Age of Onset: (Not Specified)  Comment: died at 67 during valve replacement  Problem: Heart      Relation: Brother          Age of Onset: (Not Specified)          Comment: chestpain with neg w/u  Problem: Heart      Relation: Mother          Age of Onset: (Not Specified)          Comment: Died of MI age 38  Problem: Diabetes      Relation: Mother          Age of Onset: (Not Specified)      Social History     Socioeconomic History    Marital status: Divorced     Spouse name: Not on file    Number of children: Not on file    Years of education: Not on file     Highest education level: Not on file   Occupational History    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; 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   Tobacco Use    Smoking status: Former     Current packs/day: 0.00     Types: Cigarettes     Quit date: 07/27/2001     Years since quitting: 21.9     Passive exposure: Past    Smokeless tobacco: Never   Substance and Sexual Activity    Alcohol use: Yes     Alcohol/week: 6.0 standard drinks of alcohol     Types: 6 Cans of beer per week     Comment: 6 beers per week     Drug use: No    Sexual activity: Not Currently     Partners: Male     Comment: G3P3, no hx STD, no hx abn Pap   Other Topics Concern    Not on file   Social History Narrative    08/03/19 Divorced, has 3 children, lives alone, works as a Education officer, environmental lady    Social Determinants of Catering manager Strain: Not on file  Food Insecurity: Not on file  Transportation Needs: Not on file  Physical Activity: Not on file  Stress: Not on file  Social Connections: Not on file  Intimate Partner Violence: Not on file  Housing Stability: Not on file    OBJECTIVE:  LMP 07/11/2005   Speaking in full sentences           A/P:     (L50.9) Urticaria  (primary encounter diagnosis)  Plan:   -gave her ssc number for allergy referral   -cont with zyrtec    (E66.9) Obesity (BMI 30-39.9)  Plan:   -in person for eval and labs      1. The patient indicates understanding of these issues and agrees with the plan.  2.  The patient is given an After Visit Summary sheet that lists all of their medications with directions, their allergies, orders placed during this encounter, immunization dates, and follow- up instructions.  3. I reviewed the patient's medical information and medical history   4.  I reconciled the patient's medication list and prepared and supplied needed refills.  5.  I have reviewed the past medical, family, and social history sections including the medications and allergies listed in the above medical record    Nathanial Millman,  PA-C, 06/29/2023

## 2023-07-08 ENCOUNTER — Encounter (HOSPITAL_BASED_OUTPATIENT_CLINIC_OR_DEPARTMENT_OTHER): Payer: Self-pay

## 2023-07-08 ENCOUNTER — Telehealth (HOSPITAL_BASED_OUTPATIENT_CLINIC_OR_DEPARTMENT_OTHER): Payer: Self-pay | Admitting: Registered Nurse

## 2023-07-08 ENCOUNTER — Ambulatory Visit: Payer: No Typology Code available for payment source | Attending: Dermatology | Admitting: Dermatology

## 2023-07-08 ENCOUNTER — Other Ambulatory Visit: Payer: Self-pay

## 2023-07-08 DIAGNOSIS — R21 Rash and other nonspecific skin eruption: Secondary | ICD-10-CM | POA: Insufficient documentation

## 2023-07-08 DIAGNOSIS — D229 Melanocytic nevi, unspecified: Secondary | ICD-10-CM | POA: Diagnosis present

## 2023-07-08 DIAGNOSIS — L578 Other skin changes due to chronic exposure to nonionizing radiation: Secondary | ICD-10-CM | POA: Diagnosis present

## 2023-07-08 MED ORDER — CETIRIZINE HCL 10 MG PO TABS
10.0000 mg | ORAL_TABLET | Freq: Two times a day (BID) | ORAL | 3 refills | Status: DC
Start: 2023-07-08 — End: 2023-07-22

## 2023-07-08 MED ORDER — TRIAMCINOLONE ACETONIDE 0.1 % EX OINT
TOPICAL_OINTMENT | Freq: Two times a day (BID) | CUTANEOUS | 0 refills | Status: AC
Start: 2023-07-08 — End: 2023-08-07

## 2023-07-08 NOTE — Progress Notes (Signed)
[facility]  [address] [place], [place] [address]    Dermatology Clinic Note    CC: rash  ?  HPI: Stephanie [name] is a 62 year old female     Has several weeks history of a widespread rash. Not getting better. Biopsy was nonspecific, but could be c/w urticaria.  ?  ROS: negative, no other skin complaints.  Feels well, no systemic complaints.   ?  Past Dermatologic hx:    SPEC #: [id number]        RECD: 03/28/23-2307  STATUS: SOUT  SP TYPE: DERM            COLL: 03/28/23-  SUB DR: Marlowe Shores MD     ENTERED:  03/28/23-2308     ORDERED:  [id number], [id number], [id number], HE, L1, L2, L3, PAS-Fungus     Performed at [facility], [facility]  [address] [place] [address] [phone]     >>FINAL DIAGNOSIS<<     A. SKIN, LEFT BREAST, PUNCH BIOPSY:  - Dermal edema, extravasated erythrocytes and a superficial perivascular  lymphocytic infiltrate with neutrophils and conspicuous eosinophils (see  note).     Note: Multiple levels examined. The findings are not entirely specific, but  may be compatible with the clinical impression of  urticaria/hypersensitivity reaction. No fungal organisms are identified  with a provided PASD stain. A spirochete stain is negative for T. pallidum.  Clinicopathologic correlation is advised.     After review of the entire case, Denman George. Cornejo MD of [facility] has rendered the above diagnosis, with which I concur.     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 [place] [address] [phone]     >>FINAL DIAGNOSIS<<     RIGHT LOWER EYE LID  LESION EXCISION:  Hidrocystoma.  Fragment of skin within normal limits.     Diagnosis by: Avie Arenas, MD            [facility] #: [id number]        RECD: 11/22/22-0713  STATUS: SOUT  SP TYPE: SOFTTISSUE      COLL: 11/19/22-  SUB DR: Lucienne Minks MD     ENTERED:  11/22/22-0713     ORDERED:  [id number], HE     Performed at [facility], [facility]  [address], [place] [address] [phone]     >>FINAL DIAGNOSIS<<     CYST, LEFT TEMPLE, EXCISION:  -Epidermal inclusion cyst     Diagnosis by: Luanne Bras, MD     CLINICAL HISTORY     Clinical history:  SOFT TISSUE LESION TO LEFT TEMPLE     SPEC #: [id number]        RECD: 09/28/22-1345  STATUS: SOUT   SP TYPE: DERM            COLL: 09/27/22-   SUB DR: Marlowe Shores MD      ENTERED:  09/28/22-1345  ORDERED:  [id number], L1-L3/2, L4-L6/2      Performed at [facility], [facility]   [address] [place] [address] [phone]      >>FINAL DIAGNOSIS<<     SKIN, LEFT UPPER ARM, SHAVE BIOPSY:   Lentigo simplex.      Diagnosis by: Avie Arenas, MD      CLINICAL HISTORY      Preop diagnosis:   R/O ATYPICAL NEVUS      Surgical procedure:   SKIN SHAVE      GROSS DESCRIPTION      The specimen is received in formalin labeled with patient's name, unit   number and designated " left arm" and consists of a 1.3 x 1.0 cm tan-brown,   variegated, finely granular, irregular skin shave with no identifiable   lesion on the skin surface. The specimen is inked, step sectioned, and   entirely submitted in two cassettes labeled as follows:   ?  PMH:  Patient Active Problem List:     BMI 33.0-33.9,adult     Pure hypercholesterolemia     Family history of malignant neoplasm of gastrointestinal tract     Tubular adenoma of colon     Hyperopia with astigmatism and presbyopia     Immature cataract     Hypertension, goal below 140/90     Adenomatous polyp of colon     Squamous 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 eyes     Alcohol  abuse     Depression, recurrent (HCC)     Trauma and stressor-related disorder     Nontraumatic complete tear of right rotator cuff     Localized osteoarthritis of left knee     Obesity (BMI 30-39.9)     Chronic pain of left knee     Closed fracture of left distal radius     Fatty liver     Prediabetes     Surgical menopause     Generalized anxiety disorder     Eyelid lesion    ?  Meds:  ?     Medication List            Accurate as of December 21, 2021  2:03 PM. If you have any questions, ask your nurse or doctor.                CONTINUE taking these medications      dexamethasone 4 MG tablet  Commonly known as: DECADRON     Fish Oil 1000 MG Caps     Glucosamine 750 MG Tabs     Insulin Pen Needle 31G X 8 MM Misc  Inject under the skin     liraglutide 18 MG/3ML pen injector  Commonly known as: VICTOZA  Start 0.6mg  x 1 wk, then 1.2mg  x 1 wk and then 1.8mg  x 1 wk     losartan 25 MG tablet  Commonly known as: COZAAR  Take 1 tablet by mouth in the morning.     omeprazole 40 MG capsule  Commonly known as: PriLOSEC  Take 1 capsule by mouth in the morning.     traZODone 50 MG tablet  Commonly known as: DESYREL  TAKE 1 TABLET BY MOUTH NIGHTLY     Turmeric 500 MG Caps            ?  All:  Review of Patient's Allergies indicates:  No Known Allergies?     SHx:  ?Social History    Tobacco Use  Smoking status: Former        Packs/day: 0.00        Types: Cigarettes        Quit date: 07/27/2001        Years since quitting: 21.9        Passive exposure: Past      Smokeless tobacco: Never    Alcohol use: Yes      Alcohol/week: 6.0 standard drinks of alcohol      Types: 6 Cans of beer per week      Comment: 6 beers per week     Drug use: No       FHx:  Hx of melanoma - , hx of NMSC -  ?  Physical exam/Assessment/Plan:  Well appearing pt in NAD  Mood and affect are wnl  A full skin examination was performed including scalp, head, eyes, ears, nose,  lips,  neck, chest, axillae, abdomen, back, buttocks, bilateral upper extremities,  bilateral lower extremities, hands, feet, fingers, toes, fingernails, and toenails.  Skin type: III    # Rash. Many nummular and annular red small patches on the trunk and extremities. She shows me photos of an urticarial eruption. 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   - treponemal antibody neg  - CBC, TSH, and ESR wnl 05/2023  - continue cetrizine 10mg , advised she can increase to 10mg  BID if not too drowzy. Advised she needs to consistently take antihistamines.     #Benign nevi, scattered on trunk and extremities: benign appearing brown macules and papules, most <87mm with no concerning features on exam or dermoscopy.   - Skin self-examination reviewed; ABCD's discussed.  - Sun protection and avoidance reviewed, including proper use of broad-spectrum UVB/UVA sunscreens with SPF 30 or greater, re-application after swimming and q 3-4 hours emphasized.?  Monitor:   - L upper arm 12x8 slightly dark scar like patch, favor scar, photo taken, recheck at next visit.    #Dermatoheliosis of sun-exposed areas. Benign tan macules with moth-eaten borders c/w solar lentigines present.   - Skin self-examination reviewed; ABCD's discussed.   ??- Sun protection and avoidance reviewed, including proper use of broad-spectrum UVB/UVA sunscreens with SPF 30 or greater, re-application after swimming and q 3-4 hours emphasized.?            RTC: 23mo Rash f/u.  In 1 year for TBSE  ?  ?  ?  Maudry Mayhew, MD  [facility] Dermatology         CC: Mercer Pod, MD  [address]  [place] [address]

## 2023-07-08 NOTE — Telephone Encounter (Signed)
Patient requesting televisit to discuss Wegovy injection for weight loss.   Televisit booked for Monday.   Encounter closed

## 2023-07-11 ENCOUNTER — Ambulatory Visit: Payer: No Typology Code available for payment source | Attending: Family Medicine | Admitting: Physician Assistant

## 2023-07-11 ENCOUNTER — Encounter (HOSPITAL_BASED_OUTPATIENT_CLINIC_OR_DEPARTMENT_OTHER): Payer: Self-pay | Admitting: Physician Assistant

## 2023-07-11 DIAGNOSIS — E669 Obesity, unspecified: Secondary | ICD-10-CM | POA: Insufficient documentation

## 2023-07-11 NOTE — Progress Notes (Signed)
SUBJECTIVE:  Stephanie [name] is a 62 year old female who presents for:    Language of Care: [place]     #BMI 31.55   - wondering about Reginal Lutes   - last weight was in 02/2023, no A1c of cholesterol since 2021     - she is going to the gym, some days with a personal trainer, other days just solo cardio (5 days a week total)   - working on portion control, eating more salads, lean protein, etc.   -- has been doing this for about five months   -- she's lost around   -- 167lbs down to 152lbs, so down 15lbs     - no family hx of thyroid or pancreatic cancer   - no prior hx of SI     - reviewed risks of nausea, vomiting, diarrhea, constipation, gastroparesis, and rare risk of SI     - no f/c, cough     PROBLEMS:   Patient Active Problem List:     BMI 33.0-33.9,adult     Pure hypercholesterolemia     Family history of malignant neoplasm of gastrointestinal tract     Tubular adenoma of colon     Hyperopia with astigmatism and presbyopia     Immature cataract     Hypertension, goal below 140/90     Adenomatous polyp of colon     Squamous 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 eyes     Alcohol abuse     Depression, recurrent (HCC)     Trauma and stressor-related disorder     Nontraumatic complete tear of right rotator cuff     Localized osteoarthritis of left knee     Obesity (BMI 30-39.9)     Chronic pain of left knee     Closed fracture of left distal radius     Fatty liver     Prediabetes     Surgical menopause     Generalized anxiety disorder     Eyelid lesion      MEDICATIONS: cetirizine (ZYRTEC) 10 MG tablet, Take 1 tablet by mouth in the morning and 1 tablet before bedtime., Disp: 60 tablet, Rfl: 3  triamcinolone (KENALOG) 0.1 % ointment, Apply topically 2 (two) times daily Apply 1 week on, 1 week off., Disp: 453.6 g, Rfl: 0  traZODone (DESYREL) 50 MG tablet, Take 1 tablet by mouth nightly, Disp: 90 tablet, Rfl: 2  omeprazole (PRILOSEC) 40 MG capsule, Take 1 capsule by mouth daily, Disp: 90 capsule, Rfl:  3  alendronate (FOSAMAX) 70 MG tablet, Take 70mg  every 7 days with 8 oz of water on empty stomach.  Do not take anything else by mouth and stay upright (do not lie down) for 30 min until 1st food of day., Disp: 13 tablet, Rfl: 4  losartan (COZAAR) 25 MG tablet, Take 1 tablet by mouth daily, Disp: 90 tablet, Rfl: 3  cetirizine (ZYRTEC) 10 MG tablet, Take 1 tablet by mouth in the morning., Disp: 30 tablet, Rfl: 2  meloxicam (MOBIC) 15 MG tablet, Take 1 tablet by mouth in the morning. Take with food., Disp: 30 tablet, Rfl: 1  DULoxetine (CYMBALTA) 60 MG capsule, Take 1 capsule by mouth in the morning. Stop taking duloxetine 30mg ., Disp: 90 capsule, Rfl: 3  Multiple Vitamin (MULTIVITAMIN) TABS, Take 1 tablet by mouth in the morning., Disp: , Rfl:   atorvastatin (LIPITOR) 40 MG tablet, Take 1 tablet by mouth in the morning., Disp: 90 tablet, Rfl: 3  Glucosamine  750 MG TABS, Take by mouth, Disp: , Rfl:   Omega-3 Fatty Acids (FISH OIL) 1000 MG CAPS, Take by mouth, Disp: , Rfl:   Turmeric 500 MG CAPS, Take by mouth, Disp: , Rfl:   dexamethasone (DECADRON) 4 MG tablet, Take 4 mg by mouth in the morning and 4 mg in the evening. Take with meals., Disp: , Rfl:     No current facility-administered medications on file prior to visit.       ALLERGIES:  Review of Patient's Allergies indicates:  No Known Allergies    ROS:  Negative other than mentioned in HPI     OBJECTIVE:    VITALS:  LMP 07/11/2005   General: no acute distress  Psych: normal speech form and content, normal cognition and judgment  Pulm: speaking in full sentences. No labored breathing heard     ASSESSMENT/PLAN:   PROBLEMS:   Stephanie [name] is a 62 year old female here for:    1. Obesity (BMI 30-39.9)  Patient has lost 15lbs over the last five months through diet and exercise, celebrated this accomplishment. However, she is interested in GLP1 medication. Will check lipids and A1c, and will f/u with team for final discussion.   - HEMOGLOBIN A1C; Future  - CHOLESTEROL;  Future  - HIGH DENSITY LIPOPROTEIN; Future  - LOW DENSITY LIPOPROTEIN DIRECT; Future     1. Return to clinic if symptoms don't improve or worsen. Discussed when to return to office vs seek emergent care.  2. The patient indicates understanding of these issues and agrees with the plan.   No apparent learning barriers were identified. I attempted to answer any questions regarding the diagnosis and the proposed treatment.  3.  The patient is given an After Visit Summary sheet that lists all of their medications with directions, their allergies, orders placed during this encounter, and follow- up instructions.  4. I reviewed the patient's medical information, allergies and medications. I reviewed the potential side effects of any new medications prescribed.     I spent a total of 25 minutes on this visit on the date of service (total time includes all activities performed on the date of service)     Electronically signed by: Benjamin Stain, PA-C, 07/11/2023 12:39 PM  This note is electronically signed in the electronic medical record.

## 2023-07-12 ENCOUNTER — Telehealth (HOSPITAL_BASED_OUTPATIENT_CLINIC_OR_DEPARTMENT_OTHER): Payer: Self-pay | Admitting: Internal Medicine

## 2023-07-12 ENCOUNTER — Telehealth (HOSPITAL_BASED_OUTPATIENT_CLINIC_OR_DEPARTMENT_OTHER): Payer: Self-pay | Admitting: Physician Assistant

## 2023-07-12 NOTE — Telephone Encounter (Signed)
Patient reports calling to re-schedule lab appointment scheduled for Friday to a later time that day.  No symptoms.  No new, persisting, or worsening symptoms.  No need for nurse triage assessment.  Transferred patient to scheduler's for assistance with re-scheduling the appointment and patient verbalized understanding.  Rhae Lerner, RN

## 2023-07-12 NOTE — Telephone Encounter (Signed)
Benjamin Stain, PA-C  P Centralized Refill Pool  Hi guys,    This patient has [facility] with safety net. Are any GLP1's approved for weight loss, and do high cholesterol or prediabetes play a roll in coverage?    Thank you!  Alvino Chapel

## 2023-07-13 ENCOUNTER — Other Ambulatory Visit: Payer: Self-pay

## 2023-07-13 ENCOUNTER — Ambulatory Visit: Payer: No Typology Code available for payment source | Attending: Family Medicine | Admitting: Lab

## 2023-07-13 VITALS — Ht 58.66 in | Wt 153.6 lb

## 2023-07-13 DIAGNOSIS — E669 Obesity, unspecified: Secondary | ICD-10-CM | POA: Diagnosis present

## 2023-07-13 LAB — LOW DENSITY LIPOPROTEIN DIRECT: LOW DENSITY LIPOPROTEIN DIRECT: 141 mg/dL (ref 0–189)

## 2023-07-13 LAB — CHOLESTEROL: Cholesterol: 220 mg/dL (ref 0–239)

## 2023-07-13 LAB — HEMOGLOBIN A1C
ESTIMATED AVERAGE GLUCOSE: 137 mg/dL (ref 74–160)
HEMOGLOBIN A1C: 6.4 % — ABNORMAL HIGH (ref 4.0–5.6)

## 2023-07-13 LAB — HIGH DENSITY LIPOPROTEIN: HIGH DENSITY LIPOPROTEIN: 67 mg/dL (ref 40–60)

## 2023-07-13 NOTE — Progress Notes (Signed)
Labs drawn.  2 sst  1 lav  -Allix Blomquist [place], [place], 07/13/2023

## 2023-07-14 NOTE — Telephone Encounter (Signed)
These are the following medications [facility] will cover with a PA    -Saxenda  -Wegovy    Thanks

## 2023-07-15 ENCOUNTER — Other Ambulatory Visit (HOSPITAL_BASED_OUTPATIENT_CLINIC_OR_DEPARTMENT_OTHER): Payer: No Typology Code available for payment source | Admitting: Lab

## 2023-07-15 ENCOUNTER — Encounter (HOSPITAL_BASED_OUTPATIENT_CLINIC_OR_DEPARTMENT_OTHER): Payer: Self-pay | Admitting: Physician Assistant

## 2023-07-22 ENCOUNTER — Ambulatory Visit: Payer: No Typology Code available for payment source | Attending: Family Medicine | Admitting: Family Medicine

## 2023-07-22 DIAGNOSIS — R7303 Prediabetes: Secondary | ICD-10-CM | POA: Diagnosis present

## 2023-07-22 DIAGNOSIS — E669 Obesity, unspecified: Secondary | ICD-10-CM | POA: Insufficient documentation

## 2023-07-22 MED ORDER — CETIRIZINE HCL 10 MG PO TABS
10.0000 mg | ORAL_TABLET | Freq: Two times a day (BID) | ORAL | 3 refills | Status: AC
Start: 2023-07-22 — End: 2023-11-19

## 2023-07-22 MED ORDER — CETIRIZINE HCL 10 MG PO TABS
10.0000 mg | ORAL_TABLET | Freq: Every day | ORAL | 2 refills | Status: AC
Start: 2023-07-22 — End: 2023-10-20

## 2023-07-22 MED ORDER — SEMAGLUTIDE-WEIGHT MANAGEMENT 0.25 MG/0.5ML SC SOAJ
0.2500 mg | SUBCUTANEOUS | 0 refills | Status: DC
Start: 2023-07-22 — End: 2023-07-27

## 2023-07-22 MED ORDER — ATORVASTATIN CALCIUM 40 MG PO TABS
40.0000 mg | ORAL_TABLET | Freq: Every day | ORAL | 3 refills | Status: AC
Start: 2023-07-22 — End: 2024-07-21

## 2023-07-22 NOTE — Progress Notes (Signed)
Clinic Note:    SUBJECTIVE:  Stephanie [name] is a 62 year old female with the following active problem list:    Patient Active Problem List:     BMI 33.0-33.9,adult     Pure hypercholesterolemia     Family history of malignant neoplasm of gastrointestinal tract     Tubular adenoma of colon     Hyperopia with astigmatism and presbyopia     Immature cataract     Hypertension, goal below 140/90     Adenomatous polyp of colon     Squamous 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 eyes     Alcohol abuse     Depression, recurrent (HCC)     Trauma and stressor-related disorder     Nontraumatic complete tear of right rotator cuff     Localized osteoarthritis of left knee     Obesity (BMI 30-39.9)     Chronic pain of left knee     Closed fracture of left distal radius     Fatty liver     Prediabetes     Surgical menopause     Generalized anxiety disorder     Eyelid lesion      CC:     1) obesity/ PreDM  -would like to start wegovy   -going to the gym 4x/wk  -working on diet and cutting down on beers  HEMOGLOBIN A1C (%)   Date Value   07/13/2023 6.4 (H)   04/17/2020 6.2 (H)   03/06/2015 5.6       No results found for: "POCA1C"      Past Medical History:  02/12/2016: Chronic right shoulder pain  No date: Depression  03/20/2012: Diverticulosis of colon      Comment:  Noted on colonoscopy 6/13   08/16/2010: Elevated hemoglobin A1c      Comment:  HEMOGLOBIN A1C (%) Date Value 03/06/2015 5.6                05/20/2014 5.4 05/08/2012 6.0 (H)   No results found for:               POCA1C   01/04/2023: Eyelid lesion      Comment:  Right lower eyelid lesion  11/21/2012: Hyperopia with astigmatism and presbyopia  No date: Hypertension  11/21/2012: Immature cataract  01/21/2012: Impingement syndrome of right shoulder      Comment:  10/14: pt taking  [place] med (meloxicam 7.5 mg BID                and cyclobenzaprine); Will ask for refill once supply                finished; PCP ok with refill of flexeril on temporary                 basis;   01/26/2008: Irritable bladder      Comment:  Seen by Dr Mindi Junker 5/09, no incontinence, likely                irritable bladder.  Trial of anticholinergic, refer to                urology, has appt Dr Ricki Miller 02/23/08  02/16/2005: Leiomyoma of uterus, unspecified      Comment:  Dr Mindi Junker hysterectomy November, 2006; cervix retained,               last pap 11/11 neg Repeat pap/HPV 07/2015   02/15/2014: Mass of hand  No date: OSA (obstructive  sleep apnea)  07/30/2011: Other plastic surgery for unacceptable cosmetic appearance      Comment:  Breast augmentation scheduled 08/02/11;  Dr.  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, left  01/08/2016: Schistosomiasis  08/13/2016: Squamous 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   eyes  06/18/2004: Urinary calculus, unspecified    Past Surgical History:  No date: BREAST ENHANCEMENT SURGERY      Comment:  12/12 had bilateral mammoplasty and saline implants, had               2nd surgery 5/30 for revision  01/04/2023: EXC LESION EYELID W/O CLSR/W/SIMPLE DIR CLOSURE; Right      Comment:  s/p excision of eyelid lesion, right lower eyelid,                laterally, by VJP II  No date: MAMMAPLASTY AUGMENTATION W/PROSTHETIC IMPLANT  No date: MASTOPEXY  No date: OB ANTEPARTUM CARE CESAREAN DLVR & POSTPARTUM      Comment:  x3  No date: REDUCTION MAMMAPLASTY  No date: TOTAL ABDOMINAL HYSTERECT W/WO RMVL TUBE OVARY      Comment:  for fibroids, still has cervix  No date: TOTAL KNEE REPLACEMENT; Left      Comment:  2/23    cetirizine (ZYRTEC) 10 MG tablet, Take 1 tablet by mouth in the morning and 1 tablet before bedtime., Disp: 60 tablet, Rfl: 3  triamcinolone (KENALOG) 0.1 % ointment, Apply topically 2 (two) times daily Apply 1 week on, 1 week off., Disp: 453.6 g, Rfl: 0  traZODone (DESYREL) 50 MG  tablet, Take 1 tablet by mouth nightly, Disp: 90 tablet, Rfl: 2  omeprazole (PRILOSEC) 40 MG capsule, Take 1 capsule by mouth daily, Disp: 90 capsule, Rfl: 3  alendronate (FOSAMAX) 70 MG tablet, Take 70mg  every 7 days with 8 oz of water on empty stomach.  Do not take anything else by mouth and stay upright (do not lie down) for 30 min until 1st food of day., Disp: 13 tablet, Rfl: 4  losartan (COZAAR) 25 MG tablet, Take 1 tablet by mouth daily, Disp: 90 tablet, Rfl: 3  cetirizine (ZYRTEC) 10 MG tablet, Take 1 tablet by mouth in the morning., Disp: 30 tablet, Rfl: 2  meloxicam (MOBIC) 15 MG tablet, Take 1 tablet by mouth in the morning. Take with food., Disp: 30 tablet, Rfl: 1  DULoxetine (CYMBALTA) 60 MG capsule, Take 1 capsule by mouth in the morning. Stop taking duloxetine 30mg ., Disp: 90 capsule, Rfl: 3  Multiple Vitamin (MULTIVITAMIN) TABS, Take 1 tablet by mouth in the morning., Disp: , Rfl:   atorvastatin (LIPITOR) 40 MG tablet, Take 1 tablet by mouth in the morning., Disp: 90 tablet, Rfl: 3  Glucosamine 750 MG TABS, Take by mouth, Disp: , Rfl:   Omega-3 Fatty Acids (FISH OIL) 1000 MG CAPS, Take by mouth, Disp: , Rfl:   Turmeric 500 MG CAPS, Take by mouth, Disp: , Rfl:   dexamethasone (DECADRON) 4 MG tablet, Take 4 mg by mouth in the morning and 4 mg in the evening. Take with meals., Disp: , Rfl:     No current facility-administered medications on file prior to visit.      Review of Patient's Allergies indicates:  No Known Allergies  Review of patient's family history indicates:  Problem: Cancer - Other      Relation: Father          Age of Onset: (Not Specified)          Comment: stomach  Problem: Heart      Relation: Brother          Age of Onset: (Not Specified)          Comment: died at 64 during valve replacement  Problem: Heart      Relation: Brother          Age of Onset: (Not Specified)          Comment: chestpain with neg w/u  Problem: Heart      Relation: Mother          Age of Onset: (Not  Specified)          Comment: Died of MI age 56  Problem: Diabetes      Relation: Mother          Age of Onset: (Not Specified)      Social History     Socioeconomic History    Marital status: Divorced     Spouse name: Not on file    Number of children: Not on file    Years of education: Not on file    Highest education level: Not on file   Occupational History    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; 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   Tobacco Use    Smoking status: Former     Current packs/day: 0.00     Types: Cigarettes     Quit date: 07/27/2001     Years since quitting: 22.0     Passive exposure: Past    Smokeless tobacco: Never   Substance and Sexual Activity    Alcohol use: Yes     Alcohol/week: 6.0 standard drinks of alcohol     Types: 6 Cans of beer per week     Comment: 6 beers per week     Drug use: No    Sexual activity: Not Currently     Partners: Male     Comment: G3P3, no hx STD, no hx abn Pap   Other Topics Concern    Not on file   Social History Narrative    08/03/19 Divorced, has 3 children, lives alone, works as a Education officer, environmental lady    Social Drivers of Catering manager Strain: Not on file  Food Insecurity: Not on file  Transportation Needs: Not on file  Physical Activity: Not on file  Stress: Not on file  Social Connections: Not on file  Intimate Partner Violence: Not on file  Housing Stability: Not on file    OBJECTIVE:  LMP 07/11/2005   Speaking in full sentences             A/P:     (E66.9) Obesity (BMI 30-39.9)  (primary encounter diagnosis)/(R73.03) Prediabetes  Plan:   -cont with diet and exercise  -semaglutide-Weight Management (WEGOVY) 0.25         MG/0.5ML sc auto-injection  -f/u 1 mth for tele            1. The patient indicates understanding of these issues and agrees with the plan.  2.  The patient is given an After Visit Summary sheet that lists all of their medications with directions, their allergies, orders placed during this encounter, immunization dates, and follow- up  instructions.  3. I reviewed the patient's medical information and medical history   4.  I reconciled the patient's medication list and prepared and supplied needed refills.  5.  I have reviewed the past medical, family, and social history sections including the medications and allergies listed in the above medical record    Nathanial Millman, PA-C, 07/22/2023

## 2023-07-26 ENCOUNTER — Telehealth (HOSPITAL_BASED_OUTPATIENT_CLINIC_OR_DEPARTMENT_OTHER): Payer: Self-pay | Admitting: Family Medicine

## 2023-07-26 NOTE — Telephone Encounter (Signed)
[facility] [place] together w/[facility] is updating their GLP-1 for weight loss Criteria     Note prior authorizations for [facility] will no longer be effective as of 08/30/2023.      Zepbound is now the preferred obesity agent.    Please send new rx for zepbound and route back to P CENTRALIZED REFILL POOL so that we can submit PA.

## 2023-07-27 ENCOUNTER — Other Ambulatory Visit (HOSPITAL_BASED_OUTPATIENT_CLINIC_OR_DEPARTMENT_OTHER): Payer: Self-pay | Admitting: Family Medicine

## 2023-07-27 ENCOUNTER — Encounter (HOSPITAL_BASED_OUTPATIENT_CLINIC_OR_DEPARTMENT_OTHER): Payer: Self-pay | Admitting: Family Medicine

## 2023-07-27 MED ORDER — TIRZEPATIDE-WEIGHT MANAGEMENT 2.5 MG/0.5ML SC SOAJ
2.5000 mg | SUBCUTANEOUS | 0 refills | Status: DC
Start: 2023-07-27 — End: 2023-08-01

## 2023-07-27 NOTE — Prior Authorization (Signed)
Phone:    Filled out Prior Authorization Request form for: Zepbound 2.5mg   DX & ICD 10: E66.9     Form does not require provider signature - already faxed form to insurance company for review.  Scanned form into Epic under Careers information officer - PA for: PA [employer]

## 2023-07-27 NOTE — Prior Authorization (Signed)
Patient Insurance: HSNO  Member ID: [id number]  Prior Authorization for: Zepbound 2.5mg 

## 2023-08-01 ENCOUNTER — Other Ambulatory Visit (HOSPITAL_BASED_OUTPATIENT_CLINIC_OR_DEPARTMENT_OTHER): Payer: Self-pay | Admitting: Family Medicine

## 2023-08-01 MED ORDER — TIRZEPATIDE-WEIGHT MANAGEMENT 2.5 MG/0.5ML SC SOAJ
2.5000 mg | SUBCUTANEOUS | 0 refills | Status: DC
Start: 2023-08-01 — End: 2023-08-19

## 2023-08-01 NOTE — Telephone Encounter (Signed)
Patient came to [facility] pharmacy looking for Zepbound Rx sent on 07/27/2023. Due to potential pharmacy error, patient needs new refill. Pending high priority, please review.

## 2023-08-19 ENCOUNTER — Ambulatory Visit: Payer: No Typology Code available for payment source | Attending: Family Medicine | Admitting: Family Medicine

## 2023-08-19 DIAGNOSIS — E669 Obesity, unspecified: Secondary | ICD-10-CM | POA: Insufficient documentation

## 2023-08-19 MED ORDER — TIRZEPATIDE-WEIGHT MANAGEMENT 2.5 MG/0.5ML SC SOAJ
2.5000 mg | SUBCUTANEOUS | 0 refills | Status: DC
Start: 2023-08-19 — End: 2023-11-07

## 2023-08-19 NOTE — Progress Notes (Signed)
 Clinic Note:    SUBJECTIVE:  Stephanie [name] is a 62 year old female with the following active problem list:    Patient Active Problem List:     BMI 33.0-33.9,adult     Pure hypercholesterolemia     Family history of malignant neoplasm of gastrointestinal tract     Tubular adenoma of colon     Hyperopia with astigmatism and presbyopia     Immature cataract     Hypertension, goal below 140/90     Adenomatous polyp of colon     Squamous 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 eyes     Alcohol abuse     Depression, recurrent (HCC)     Trauma and stressor-related disorder     Nontraumatic complete tear of right rotator cuff     Localized osteoarthritis of left knee     Obesity (BMI 30-39.9)     Chronic pain of left knee     Closed fracture of left distal radius     Fatty liver     Prediabetes     Surgical menopause     Generalized anxiety disorder     Eyelid lesion      CC:     1) obesity   -taking zepobound now  -going well   -feeling motivated to go to the gym, has energy  -working w Systems analyst as well  -wants to keep same dose  -diet: working w nutritionist, eating well balanced    Past Medical History:  02/12/2016: Chronic right shoulder pain  No date: Depression  03/20/2012: Diverticulosis of colon      Comment:  Noted on colonoscopy 6/13   08/16/2010: Elevated hemoglobin A1c      Comment:  HEMOGLOBIN A1C (%) Date Value 03/06/2015 5.6                05/20/2014 5.4 05/08/2012 6.0 (H)   No results found for:               POCA1C   01/04/2023: Eyelid lesion      Comment:  Right lower eyelid lesion  11/21/2012: Hyperopia with astigmatism and presbyopia  No date: Hypertension  11/21/2012: Immature cataract  01/21/2012: Impingement syndrome of right shoulder      Comment:  10/14: pt taking  [place] med (meloxicam 7.5 mg BID                and cyclobenzaprine); Will ask for refill once supply                finished; PCP ok with refill of flexeril on temporary                basis;   01/26/2008: Irritable  bladder      Comment:  Seen by Dr Mindi Junker 5/09, no incontinence, likely                irritable bladder.  Trial of anticholinergic, refer to                urology, has appt Dr Ricki Miller 02/23/08  02/16/2005: Leiomyoma of uterus, unspecified      Comment:  Dr Mindi Junker hysterectomy November, 2006; cervix retained,               last pap 11/11 neg Repeat pap/HPV 07/2015   02/15/2014: Mass of hand  No date: OSA (obstructive sleep apnea)  07/30/2011: Other plastic surgery for unacceptable cosmetic appearance      Comment:  Breast augmentation scheduled 08/02/11;  Dr.  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, left  01/08/2016: Schistosomiasis  08/13/2016: Squamous 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   eyes  06/18/2004: Urinary calculus, unspecified    Past Surgical History:  No date: BREAST ENHANCEMENT SURGERY      Comment:  12/12 had bilateral mammoplasty and saline implants, had               2nd surgery 5/30 for revision  01/04/2023: EXC LESION EYELID W/O CLSR/W/SIMPLE DIR CLOSURE; Right      Comment:  s/p excision of eyelid lesion, right lower eyelid,                laterally, by VJP II  No date: MAMMAPLASTY AUGMENTATION W/PROSTHETIC IMPLANT  No date: MASTOPEXY  No date: OB ANTEPARTUM CARE CESAREAN DLVR & POSTPARTUM      Comment:  x3  No date: REDUCTION MAMMAPLASTY  No date: TOTAL ABDOMINAL HYSTERECT W/WO RMVL TUBE OVARY      Comment:  for fibroids, still has cervix  No date: TOTAL KNEE REPLACEMENT; Left      Comment:  2/23    tirzepatide-Weight Management (ZEPBOUND) 2.5 MG/0.5ML sc auto-injector, Inject 2.5 mg under the skin once a week, Disp: 2 mL, Rfl: 0  atorvastatin (LIPITOR) 40 MG tablet, Take 1 tablet by mouth daily, Disp: 90 tablet, Rfl: 3  cetirizine (ZYRTEC) 10 MG tablet, Take 1 tablet by mouth in the morning and 1 tablet before  bedtime., Disp: 60 tablet, Rfl: 3  cetirizine (ZYRTEC) 10 MG tablet, Take 1 tablet by mouth daily, Disp: 30 tablet, Rfl: 2  traZODone (DESYREL) 50 MG tablet, Take 1 tablet by mouth nightly, Disp: 90 tablet, Rfl: 2  omeprazole (PRILOSEC) 40 MG capsule, Take 1 capsule by mouth daily, Disp: 90 capsule, Rfl: 3  alendronate (FOSAMAX) 70 MG tablet, Take 70mg  every 7 days with 8 oz of water on empty stomach.  Do not take anything else by mouth and stay upright (do not lie down) for 30 min until 1st food of day., Disp: 13 tablet, Rfl: 4  losartan (COZAAR) 25 MG tablet, Take 1 tablet by mouth daily, Disp: 90 tablet, Rfl: 3  meloxicam (MOBIC) 15 MG tablet, Take 1 tablet by mouth in the morning. Take with food., Disp: 30 tablet, Rfl: 1  DULoxetine (CYMBALTA) 60 MG capsule, Take 1 capsule by mouth in the morning. Stop taking duloxetine 30mg ., Disp: 90 capsule, Rfl: 3  Multiple Vitamin (MULTIVITAMIN) TABS, Take 1 tablet by mouth in the morning., Disp: , Rfl:   Glucosamine 750 MG TABS, Take by mouth, Disp: , Rfl:   Omega-3 Fatty Acids (FISH OIL) 1000 MG CAPS, Take by mouth, Disp: , Rfl:   Turmeric 500 MG CAPS, Take by mouth, Disp: , Rfl:   dexamethasone (DECADRON) 4 MG tablet, Take 4 mg by mouth in the morning and 4 mg in the evening. Take with meals., Disp: , Rfl:     No current facility-administered medications on file prior to visit.      Review of Patient's Allergies indicates:  No Known Allergies    Review of patient's family history indicates:  Problem: Cancer - Other      Relation: Father  Age of Onset: (Not Specified)          Comment: stomach  Problem: Heart      Relation: Brother          Age of Onset: (Not Specified)          Comment: died at 59 during valve replacement  Problem: Heart      Relation: Brother          Age of Onset: (Not Specified)          Comment: chestpain with neg w/u  Problem: Heart      Relation: Mother          Age of Onset: (Not Specified)          Comment: Died of MI age 70  Problem:  Diabetes      Relation: Mother          Age of Onset: (Not Specified)      Social History     Socioeconomic History    Marital status: Divorced     Spouse name: Not on file    Number of children: Not on file    Years of education: Not on file    Highest education level: Not on file   Occupational History    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; 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   Tobacco Use    Smoking status: Former     Current packs/day: 0.00     Types: Cigarettes     Quit date: 07/27/2001     Years since quitting: 22.0     Passive exposure: Past    Smokeless tobacco: Never   Substance and Sexual Activity    Alcohol use: Yes     Alcohol/week: 6.0 standard drinks of alcohol     Types: 6 Cans of beer per week     Comment: 6 beers per week     Drug use: No    Sexual activity: Not Currently     Partners: Male     Comment: G3P3, no hx STD, no hx abn Pap   Other Topics Concern    Not on file   Social History Narrative    08/03/19 Divorced, has 3 children, lives alone, works as a Education officer, environmental lady    Social Drivers of Catering manager Strain: Not on file  Food Insecurity: Not on file  Transportation Needs: Not on file  Physical Activity: Not on file  Stress: Not on file  Social Connections: Not on file  Intimate Partner Violence: Not on file  Housing Stability: Not on file    OBJECTIVE:  LMP 07/11/2005   Speaking in full sentences             A/P:     (E66.9) Obesity (BMI 30-39.9)  Plan:  -cont with diet and exercise  - tirzepatide-Weight Management (ZEPBOUND) 2.5         MG/0.5ML sc auto-injector                  1. The patient indicates understanding of these issues and agrees with the plan.  2.  The patient is given an After Visit Summary sheet that lists all of their medications with directions, their allergies, orders placed during this encounter, immunization dates, and follow- up instructions.  3. I reviewed the patient's medical information and medical history   4.  I reconciled the patient's medication list and  prepared and supplied needed refills.  5.  I have  reviewed the past medical, family, and social history sections including the medications and allergies listed in the above medical record    Nathanial Millman, P

## 2023-08-22 ENCOUNTER — Encounter (HOSPITAL_BASED_OUTPATIENT_CLINIC_OR_DEPARTMENT_OTHER): Payer: Self-pay | Admitting: Family Medicine

## 2023-08-22 DIAGNOSIS — E669 Obesity, unspecified: Secondary | ICD-10-CM

## 2023-08-22 NOTE — Telephone Encounter (Signed)
 PER Patient (self), Stephanie [name] is a 62 year old female has requested a refill of zepbound 2.5mg     Last OFFICE/TELE Visit:  08/19/23 with zuckerman      Last Physical Exam:   02/21/2013     There are no preventive care reminders to display for this patient.    Other Med Adult:  Most Recent BP Reading(s)  03/28/23 : 115/75        Cholesterol (mg/dL)   Date Value   16/05/9603 220     LOW DENSITY LIPOPROTEIN DIRECT (mg/dL)   Date Value   54/04/8118 141     HIGH DENSITY LIPOPROTEIN (mg/dL)   Date Value   14/78/2956 67     TRIGLYCERIDES (mg/dL)   Date Value   21/30/8657 254 (H)         THYROID SCREEN TSH REFLEX FT4 (uIU/mL)   Date Value   05/16/2023 0.778         TSH (THYROID STIM HORMONE) (uIU/mL)   Date Value   12/10/2016 0.790       HEMOGLOBIN A1C (%)   Date Value   07/13/2023 6.4 (H)       No results found for: "POCA1C"      INR (no units)   Date Value   08/03/2021 1.1   04/22/2008 1.0 (L)   02/07/2005 1.0 (L)       SODIUM (mmol/L)   Date Value   10/04/2022 140       POTASSIUM (mmol/L)   Date Value   10/04/2022 4.4           CREATININE (mg/dL)   Date Value   84/69/6295 0.6       Documented patient preferred pharmacies:    [facility],  - [address]  Phone: [phone] Fax: [phone]

## 2023-08-24 ENCOUNTER — Encounter (HOSPITAL_BASED_OUTPATIENT_CLINIC_OR_DEPARTMENT_OTHER): Payer: Self-pay | Admitting: Family Medicine

## 2023-08-25 MED ORDER — DULOXETINE HCL 60 MG PO CPEP
60.0000 mg | ORAL_CAPSULE | Freq: Every day | ORAL | 3 refills | Status: DC
Start: 2023-08-25 — End: 2024-02-22

## 2023-08-25 NOTE — Telephone Encounter (Signed)
 PER Patient (self), Stephanie [name] is a 62 year old female has requested a refill of      -  DULoxetine        Last Office Visit: 12 20 24   with zuckerman   Last Physical Exam: 6 25 14       There are no preventive care reminders to display for this patient.     Other Med Adult:  Most Recent BP Reading(s)  03/28/23 : 115/75        Cholesterol (mg/dL)   Date Value   96/29/5284 220     LOW DENSITY LIPOPROTEIN DIRECT (mg/dL)   Date Value   13/24/4010 141     HIGH DENSITY LIPOPROTEIN (mg/dL)   Date Value   27/25/3664 67     TRIGLYCERIDES (mg/dL)   Date Value   40/34/7425 254 (H)         THYROID SCREEN TSH REFLEX FT4 (uIU/mL)   Date Value   05/16/2023 0.778         TSH (THYROID STIM HORMONE) (uIU/mL)   Date Value   12/10/2016 0.790       HEMOGLOBIN A1C (%)   Date Value   07/13/2023 6.4 (H)       No results found for: "POCA1C"      INR (no units)   Date Value   08/03/2021 1.1   04/22/2008 1.0 (L)   02/07/2005 1.0 (L)       SODIUM (mmol/L)   Date Value   10/04/2022 140       POTASSIUM (mmol/L)   Date Value   10/04/2022 4.4           CREATININE (mg/dL)   Date Value   95/63/8756 0.6        Documented patient preferred pharmacies:    [facility], [place] - [address]  Phone: [phone] Fax: [phone]

## 2023-09-23 ENCOUNTER — Other Ambulatory Visit: Payer: Self-pay

## 2023-09-23 ENCOUNTER — Encounter (HOSPITAL_BASED_OUTPATIENT_CLINIC_OR_DEPARTMENT_OTHER): Payer: Self-pay

## 2023-09-23 ENCOUNTER — Ambulatory Visit
Admission: RE | Admit: 2023-09-23 | Discharge: 2023-09-23 | Disposition: A | Discharge status: Home / Self Care | Payer: No Typology Code available for payment source | Source: Ambulatory Visit | Attending: Diagnostic Radiology | Admitting: Diagnostic Radiology

## 2023-09-23 DIAGNOSIS — Z1239 Encounter for other screening for malignant neoplasm of breast: Secondary | ICD-10-CM

## 2023-09-23 DIAGNOSIS — Z1231 Encounter for screening mammogram for malignant neoplasm of breast: Secondary | ICD-10-CM | POA: Diagnosis present

## 2023-09-26 ENCOUNTER — Encounter (HOSPITAL_BASED_OUTPATIENT_CLINIC_OR_DEPARTMENT_OTHER): Payer: Self-pay | Admitting: Ophthalmology

## 2023-09-26 ENCOUNTER — Other Ambulatory Visit: Payer: Self-pay

## 2023-09-26 ENCOUNTER — Ambulatory Visit: Payer: No Typology Code available for payment source | Attending: Ophthalmology | Admitting: Ophthalmology

## 2023-09-26 DIAGNOSIS — H31002 Unspecified chorioretinal scars, left eye: Secondary | ICD-10-CM | POA: Insufficient documentation

## 2023-09-26 DIAGNOSIS — H5213 Myopia, bilateral: Secondary | ICD-10-CM | POA: Insufficient documentation

## 2023-09-26 DIAGNOSIS — H524 Presbyopia: Secondary | ICD-10-CM | POA: Insufficient documentation

## 2023-09-26 DIAGNOSIS — H52203 Unspecified astigmatism, bilateral: Secondary | ICD-10-CM | POA: Insufficient documentation

## 2023-09-26 NOTE — Progress Notes (Signed)
For evaluation of "blurry vision."    Impression,    1. Myopia, astigmatism, presbyopia-    she was refracted and given a prescription to get glasses that she can fill at any optical shop.    2. Large hyperpigmented CRS OS temporal retina    Fundus photos-large hyperpigmented lesion of the peripheral retina OS tempoarally           F/u with Dr. Melburn Popper who knows her well in 1-2 weeks      3. Mild cataract OU

## 2023-09-26 NOTE — Progress Notes (Signed)
Pt here for urgent care visit    Pt c/o daily pain and discharge OS x 3 weeks  Denies pain OD but has some discharge OD  Pt c/o an overall decrease in va ou and headlights at night make it difficult to drive so she has stopped driving at night  Denies flashes or floaters ou  Doesn't use eye gtts    Ocular HX-Immature cataract  Hyperopia,astigmatism and presbyopia    LEE-01-12-23    Optos ou done today

## 2023-09-28 ENCOUNTER — Other Ambulatory Visit (HOSPITAL_BASED_OUTPATIENT_CLINIC_OR_DEPARTMENT_OTHER): Payer: Self-pay | Admitting: Family Medicine

## 2023-09-28 ENCOUNTER — Ambulatory Visit: Payer: No Typology Code available for payment source | Admitting: Family Medicine

## 2023-09-28 DIAGNOSIS — E669 Obesity, unspecified: Secondary | ICD-10-CM

## 2023-09-28 MED ORDER — TIRZEPATIDE-WEIGHT MANAGEMENT 5 MG/0.5ML SC SOAJ
5.0000 mg | SUBCUTANEOUS | 0 refills | Status: AC
Start: 2023-09-28 — End: 2023-10-26

## 2023-09-30 ENCOUNTER — Encounter (HOSPITAL_BASED_OUTPATIENT_CLINIC_OR_DEPARTMENT_OTHER): Payer: Self-pay | Admitting: Family Medicine

## 2023-09-30 DIAGNOSIS — F5104 Psychophysiologic insomnia: Secondary | ICD-10-CM

## 2023-09-30 DIAGNOSIS — Z6833 Body mass index (BMI) 33.0-33.9, adult: Secondary | ICD-10-CM

## 2023-09-30 DIAGNOSIS — G8929 Other chronic pain: Secondary | ICD-10-CM

## 2023-10-01 NOTE — Telephone Encounter (Signed)
 active refills [facility]

## 2023-10-13 ENCOUNTER — Ambulatory Visit: Payer: No Typology Code available for payment source | Admitting: Ophthalmology

## 2023-10-17 ENCOUNTER — Encounter (HOSPITAL_BASED_OUTPATIENT_CLINIC_OR_DEPARTMENT_OTHER): Payer: No Typology Code available for payment source | Admitting: Dermatology

## 2023-10-18 ENCOUNTER — Encounter (HOSPITAL_BASED_OUTPATIENT_CLINIC_OR_DEPARTMENT_OTHER): Payer: Self-pay | Admitting: Ophthalmology

## 2023-10-18 ENCOUNTER — Ambulatory Visit: Payer: No Typology Code available for payment source | Attending: Ophthalmology | Admitting: Ophthalmology

## 2023-10-18 ENCOUNTER — Other Ambulatory Visit: Payer: Self-pay

## 2023-10-18 DIAGNOSIS — H269 Unspecified cataract: Secondary | ICD-10-CM | POA: Diagnosis present

## 2023-10-18 DIAGNOSIS — H524 Presbyopia: Secondary | ICD-10-CM | POA: Insufficient documentation

## 2023-10-18 DIAGNOSIS — H04129 Dry eye syndrome of unspecified lacrimal gland: Secondary | ICD-10-CM | POA: Insufficient documentation

## 2023-10-18 DIAGNOSIS — H5213 Myopia, bilateral: Secondary | ICD-10-CM | POA: Insufficient documentation

## 2023-10-18 DIAGNOSIS — H52203 Unspecified astigmatism, bilateral: Secondary | ICD-10-CM | POA: Diagnosis present

## 2023-10-18 MED ORDER — CARBOXYMETHYLCELLULOSE SODIUM 0.5 % OP SOLN
1.0000 [drp] | Freq: Four times a day (QID) | OPHTHALMIC | 12 refills | Status: AC
Start: 2023-10-18 — End: 2025-10-17

## 2023-10-18 NOTE — Progress Notes (Signed)
Patient presents with:  Foreign Body Sensation,eyes: Her right eye more than left eye, has foreign body sensations, some discharge, vision blurry for two months.  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  She will use artificial tears in both eyes, 4-6 times a day.    Cataracts, early. At this point, she should do okay with glasses.    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  She will use artificial tears in both eyes, 4-6 times a day.

## 2023-10-18 NOTE — Progress Notes (Signed)
Follow up appointment after being seen as an urgent appointment.  She is feeling better.   Increased photophobia.         Large hyperpigmented CRS OS    Bilateral cataracts

## 2023-11-07 ENCOUNTER — Ambulatory Visit: Attending: Family Medicine | Admitting: Family Medicine

## 2023-11-07 DIAGNOSIS — E669 Obesity, unspecified: Secondary | ICD-10-CM | POA: Insufficient documentation

## 2023-11-07 MED ORDER — TIRZEPATIDE-WEIGHT MANAGEMENT 2.5 MG/0.5ML SC SOAJ
2.5000 mg | SUBCUTANEOUS | 0 refills | Status: AC
Start: 2023-11-07 — End: 2023-12-05

## 2023-11-07 NOTE — Progress Notes (Signed)
 Clinic Note:    SUBJECTIVE:  Stephanie [name] is a 62 year old female with the following active problem list:    Patient Active Problem List:     BMI 33.0-33.9,adult     Pure hypercholesterolemia     Family history of malignant neoplasm of gastrointestinal tract     Tubular adenoma of colon     Hyperopia with astigmatism and presbyopia     Immature cataract     Hypertension, goal below 140/90     Adenomatous polyp of colon     Squamous 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 eyes     Alcohol abuse     Depression, recurrent (HCC)     Trauma and stressor-related disorder     Nontraumatic complete tear of right rotator cuff     Localized osteoarthritis of left knee     Obesity (BMI 30-39.9)     Chronic pain of left knee     Closed fracture of left distal radius     Fatty liver     Prediabetes     Surgical menopause     Generalized anxiety disorder     Eyelid lesion      CC:     1) obesity   -reports zepbound was causing body pains   -would like to decrease dose  -helped with weight loss, lost about ~ 10 lbs      Past Medical History:  02/12/2016: Chronic right shoulder pain  No date: Depression  03/20/2012: Diverticulosis of colon      Comment:  Noted on colonoscopy 6/13   08/16/2010: Elevated hemoglobin A1c      Comment:  HEMOGLOBIN A1C (%) Date Value 03/06/2015 5.6                05/20/2014 5.4 05/08/2012 6.0 (H)   No results found for:               POCA1C   01/04/2023: Eyelid lesion      Comment:  Right lower eyelid lesion  11/21/2012: Hyperopia with astigmatism and presbyopia  No date: Hypertension  11/21/2012: Immature cataract  01/21/2012: Impingement syndrome of right shoulder      Comment:  10/14: pt taking  [place] med (meloxicam 7.5 mg BID                and cyclobenzaprine); Will ask for refill once supply                finished; PCP ok with refill of flexeril on temporary                basis;   01/26/2008: Irritable bladder      Comment:  Seen by Dr Mindi Junker 5/09, no incontinence, likely                 irritable bladder.  Trial of anticholinergic, refer to                urology, has appt Dr Ricki Miller 02/23/08  02/16/2005: Leiomyoma of uterus, unspecified      Comment:  Dr Mindi Junker hysterectomy November, 2006; cervix retained,               last pap 11/11 neg Repeat pap/HPV 07/2015   02/15/2014: Mass of hand  No date: OSA (obstructive sleep apnea)  07/30/2011: Other plastic surgery for unacceptable cosmetic appearance      Comment:  Breast augmentation scheduled 08/02/11;  Dr.  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, left  01/08/2016: Schistosomiasis  08/13/2016: Squamous 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   eyes  06/18/2004: Urinary calculus, unspecified    Past Surgical History:  No date: BREAST ENHANCEMENT SURGERY      Comment:  12/12 had bilateral mammoplasty and saline implants, had               2nd surgery 5/30 for revision  01/04/2023: EXC LESION EYELID W/O CLSR/W/SIMPLE DIR CLOSURE; Right      Comment:  s/p excision of eyelid lesion, right lower eyelid,                laterally, by VJP II  No date: MAMMAPLASTY AUGMENTATION W/PROSTHETIC IMPLANT  No date: MASTOPEXY  No date: OB ANTEPARTUM CARE CESAREAN DLVR & POSTPARTUM      Comment:  x3  No date: REDUCTION MAMMAPLASTY  No date: TOTAL ABDOMINAL HYSTERECT W/WO RMVL TUBE OVARY      Comment:  for fibroids, still has cervix  No date: TOTAL KNEE REPLACEMENT; Left      Comment:  2/23    carboxymethylcellulose (REFRESH TEARS) 0.5 % SOLN, Place 1 drop into both eyes in the morning and 1 drop at noon and 1 drop in the evening and 1 drop before bedtime., Disp: 15 mL, Rfl: 12  DULoxetine (CYMBALTA) 60 MG capsule, Take 1 capsule by mouth daily Stop taking duloxetine 30mg , Disp: 90 capsule, Rfl: 3  atorvastatin (LIPITOR) 40 MG tablet, Take 1 tablet by mouth daily, Disp: 90 tablet, Rfl:  3  cetirizine (ZYRTEC) 10 MG tablet, Take 1 tablet by mouth in the morning and 1 tablet before bedtime., Disp: 60 tablet, Rfl: 3  traZODone (DESYREL) 50 MG tablet, Take 1 tablet by mouth nightly, Disp: 90 tablet, Rfl: 2  omeprazole (PRILOSEC) 40 MG capsule, Take 1 capsule by mouth daily, Disp: 90 capsule, Rfl: 3  alendronate (FOSAMAX) 70 MG tablet, Take 70mg  every 7 days with 8 oz of water on empty stomach.  Do not take anything else by mouth and stay upright (do not lie down) for 30 min until 1st food of day., Disp: 13 tablet, Rfl: 4  losartan (COZAAR) 25 MG tablet, Take 1 tablet by mouth daily, Disp: 90 tablet, Rfl: 3  meloxicam (MOBIC) 15 MG tablet, Take 1 tablet by mouth in the morning. Take with food., Disp: 30 tablet, Rfl: 1  Multiple Vitamin (MULTIVITAMIN) TABS, Take 1 tablet by mouth in the morning., Disp: , Rfl:   Glucosamine 750 MG TABS, Take by mouth, Disp: , Rfl:   Omega-3 Fatty Acids (FISH OIL) 1000 MG CAPS, Take by mouth, Disp: , Rfl:   Turmeric 500 MG CAPS, Take by mouth, Disp: , Rfl:     No current facility-administered medications on file prior to visit.      Review of Patient's Allergies indicates:  No Known Allergies    Review of patient's family history indicates:  Problem: Cancer - Other      Relation: Father          Age of Onset: (Not Specified)          Comment: stomach  Problem: Heart      Relation: Brother          Age of Onset: (Not Specified)          Comment:  died at 80 during valve replacement  Problem: Heart      Relation: Brother          Age of Onset: (Not Specified)          Comment: chestpain with neg w/u  Problem: Heart      Relation: Mother          Age of Onset: (Not Specified)          Comment: Died of MI age 4  Problem: Diabetes      Relation: Mother          Age of Onset: (Not Specified)      Social History     Socioeconomic History    Marital status: Divorced     Spouse name: Not on file    Number of children: Not on file    Years of education: Not on file    Highest  education level: Not on file   Occupational History    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; 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   Tobacco Use    Smoking status: Former     Current packs/day: 0.00     Types: Cigarettes     Quit date: 07/27/2001     Years since quitting: 22.2     Passive exposure: Past    Smokeless tobacco: Never   Substance and Sexual Activity    Alcohol use: Yes     Alcohol/week: 6.0 standard drinks of alcohol     Types: 6 Cans of beer per week     Comment: 6 beers per week     Drug use: No    Sexual activity: Not Currently     Partners: Male     Comment: G3P3, no hx STD, no hx abn Pap   Other Topics Concern    Not on file   Social History Narrative    08/03/19 Divorced, has 3 children, lives alone, works as a Education officer, environmental lady    Social Drivers of Catering manager Strain: Not on file  Food Insecurity: Not on file  Transportation Needs: Not on file  Physical Activity: Not on file  Stress: Not on file  Social Connections: Not on file  Intimate Partner Violence: Not on file  Housing Stability: Not on file    OBJECTIVE:  LMP 07/11/2005   Speaking in full sentences             A/P:       (E66.9) Obesity (BMI 30-39.9)  (primary encounter diagnosis)  Plan: tirzepatide-Weight Management (ZEPBOUND) 2.5         MG/0.5ML sc auto-injector  -1 mth in person for f/u            1. The patient indicates understanding of these issues and agrees with the plan.  2.  The patient is given an After Visit Summary sheet that lists all of their medications with directions, their allergies, orders placed during this encounter, immunization dates, and follow- up instructions.  3. I reviewed the patient's medical information and medical history   4.  I reconciled the patient's medication list and prepared and supplied needed refills.  5.  I have reviewed the past medical, family, and social history sections including the medications and allergies listed in the above medical record    Nathanial Millman, PA-C,  11/07/2023

## 2023-11-09 ENCOUNTER — Telehealth (HOSPITAL_BASED_OUTPATIENT_CLINIC_OR_DEPARTMENT_OTHER): Payer: Self-pay

## 2023-11-09 ENCOUNTER — Telehealth (HOSPITAL_BASED_OUTPATIENT_CLINIC_OR_DEPARTMENT_OTHER): Payer: Self-pay | Admitting: Family Medicine

## 2023-11-09 DIAGNOSIS — L509 Urticaria, unspecified: Secondary | ICD-10-CM

## 2023-11-09 NOTE — Telephone Encounter (Signed)
 REFERRAL REQUEST- PROVIDER, PLEASE REVIEW AND SIGN ORDER IF APPROPRIATE      HOW IS REFERRAL BEING REQUESTED: phone    WHO IS REFERRAL BEING REQUESTED BY: Patient    REFERRED TO SPECIALTY: allergy    DIAGNOSIS/CHIEF COMPLAINT: Urticaria  -- being seen at [facility] asking for a second opnion    HAVE YOU SEEN YOUR PCP FOR THIS ISSUE: no    HAVE YOU SEEN YOUR PCP WITHIN THE LAST YEAR: yes      [facility] ONLY  PROVIDER:  DOS:  NPI:  NUMBER OF VISITS:  LOCATION:  PHONE:  FAX:     Pt advised that [facility] will request the referral from pcp and pt will look into another location to schedule app than call [facility] back with app info

## 2023-11-09 NOTE — Telephone Encounter (Signed)
 Stephanie [name], Rosanna L., PA-C  P [facility]  Pls schedule 1 mth in person fro weight w me.  TY

## 2023-11-10 IMAGING — MR RM PELVE COM CONTRASTE (não inclui articulações coxofemorais)
12 of 18 series · 22 of 48 positions shown · IV contrast (y)
Comparison: none

[Series 2: T2 · coronal · 5.0mm · 0.74mm/px · 2 of 30 slices shown (1 of 4)]
[im 1/30]
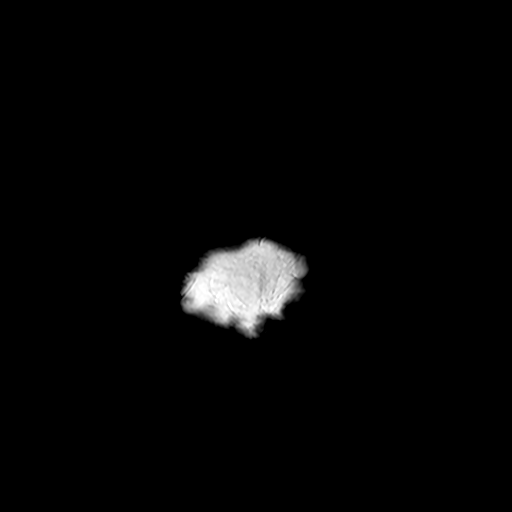
[im 30/30]
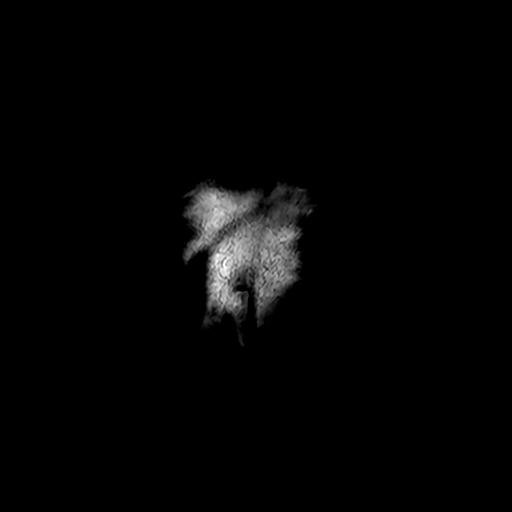

[Series 3: T2 · sagittal · 4.0mm · 0.55mm/px · 2 of 28 slices shown (2 of 4)]
[im 1/28]
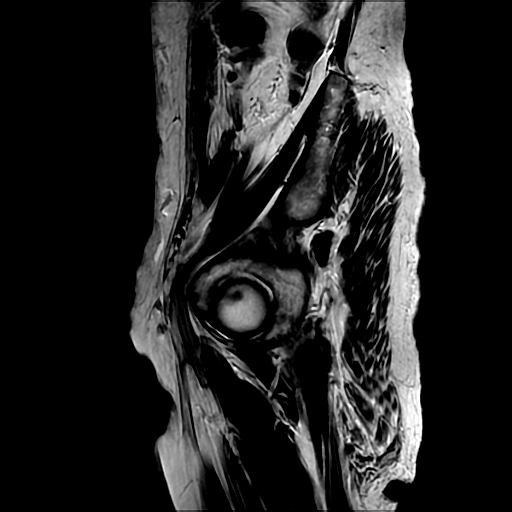
[im 28/28]
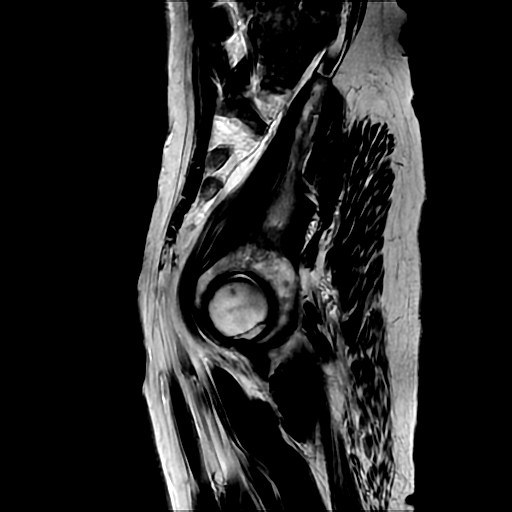

[Series 4: T2 · axial · 5.0mm · 0.68mm/px · 1 of 30 slices shown (3 of 4)]
[im 1/30]
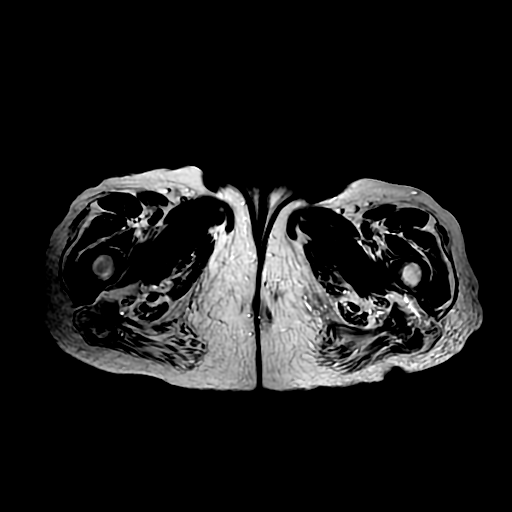

[Series 5: FLAIR · axial · 5.0mm · 0.68mm/px · 1 of 30 slices shown]
[im 1/30]
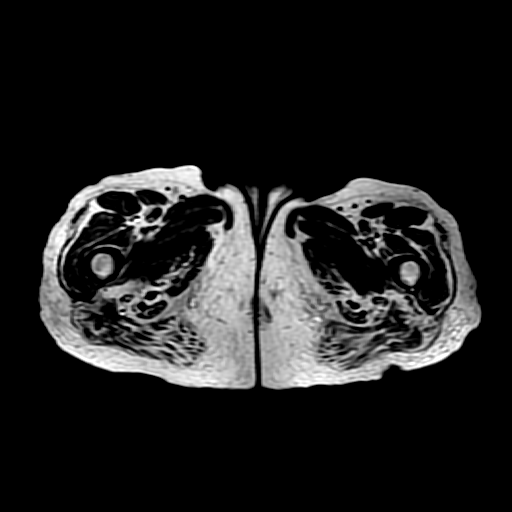

[Series 6: DWI · axial · 5.0mm · 1.37mm/px · z∈[-58,+116]mm · 2 of 60 slices shown]
[im 1/60]
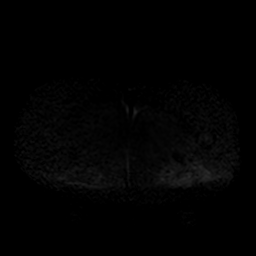
[im 60/60]
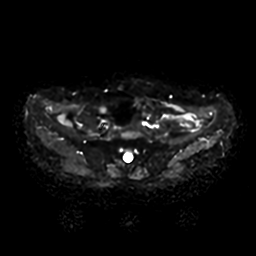

[Series 7: T2 · coronal · 3.0mm · 0.43mm/px · 1 of 35 slices shown (4 of 4)]
[im 1/35]
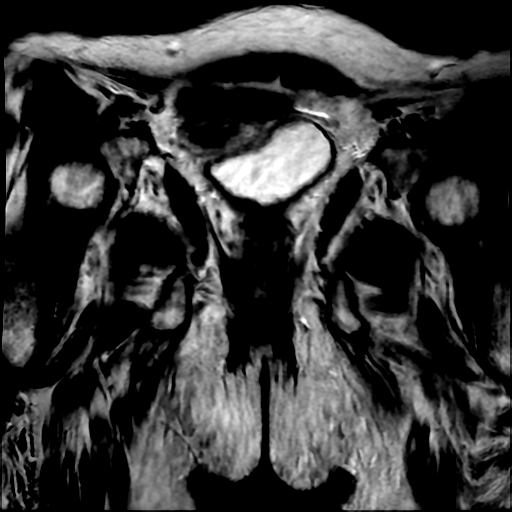

[Series 8: ax dual echo · axial · 5.0mm · 0.68mm/px · z∈[-55,+143]mm · 3 of 68 slices shown]
[im 1/68]
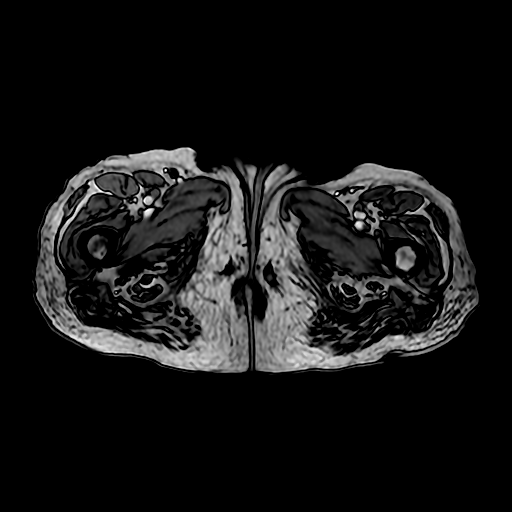
[im 34/68]
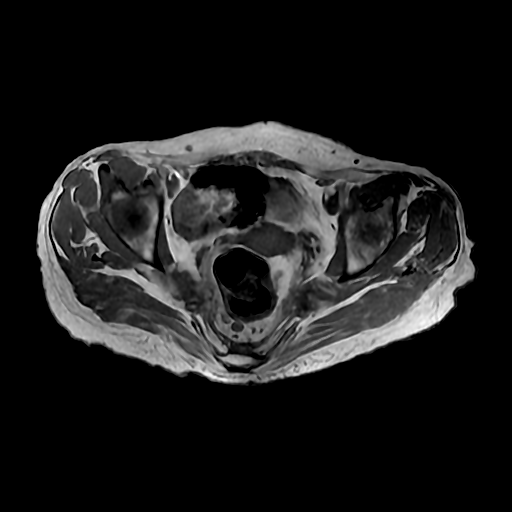
[im 68/68]
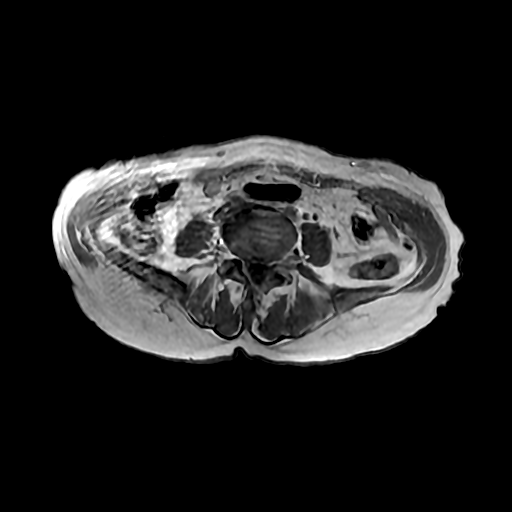

[Series 10: T1 dynamic · sagittal · 4.7mm · 0.59mm/px · 3 of 70 slices shown (1 of 3)]
[im 1/70]
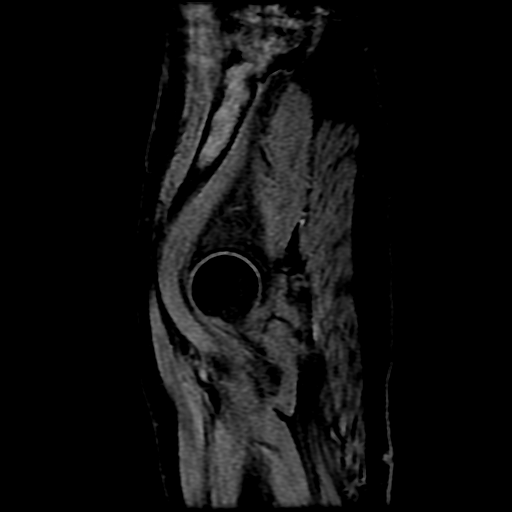
[im 35/70]
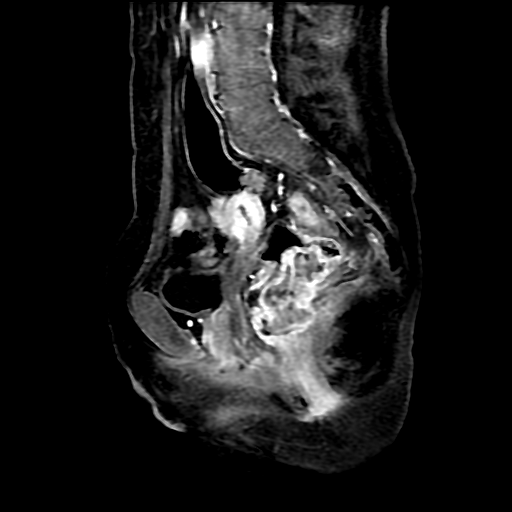
[im 70/70]
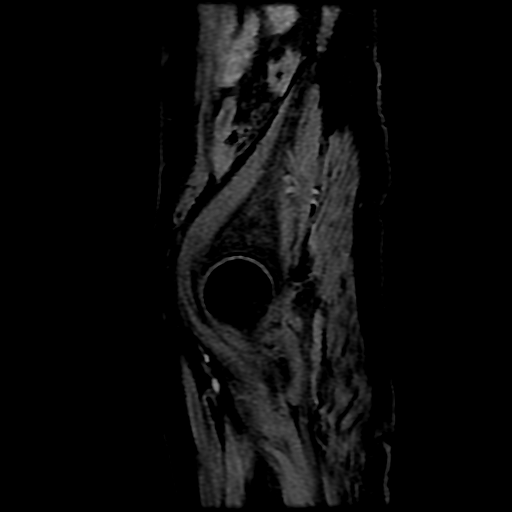

[Series 650: ADC · axial · 5.0mm · 1.37mm/px · 1 of 30 slices shown]
[im 1/30]
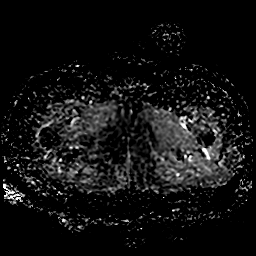

[Series 651: eadc · axial · 5.0mm · 1.37mm/px · 1 of 30 slices shown]
[im 1/30]
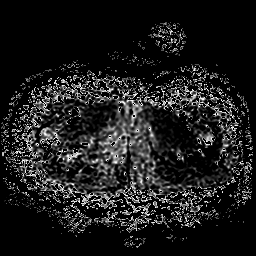

[Series 901: T1 dynamic · axial · 3.9mm · 0.61mm/px · z∈[-74,+144]mm · 4 of 110 slices shown (2 of 3)]
[im 1/110]
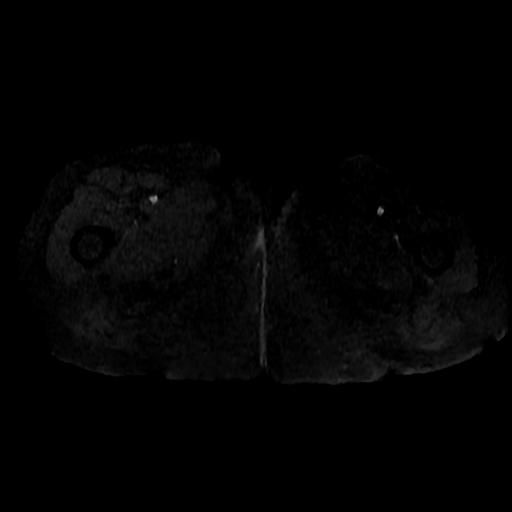
[im 37/110]
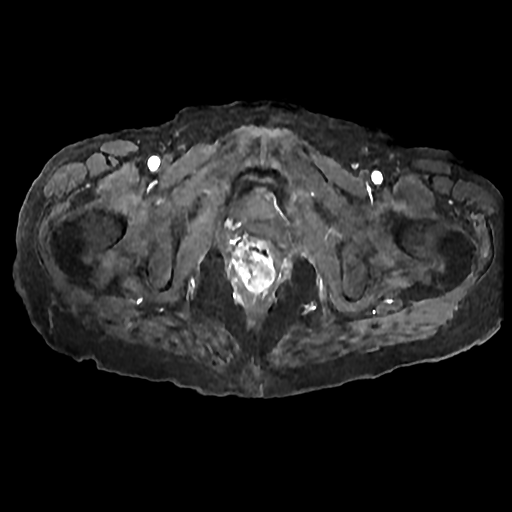
[im 73/110]
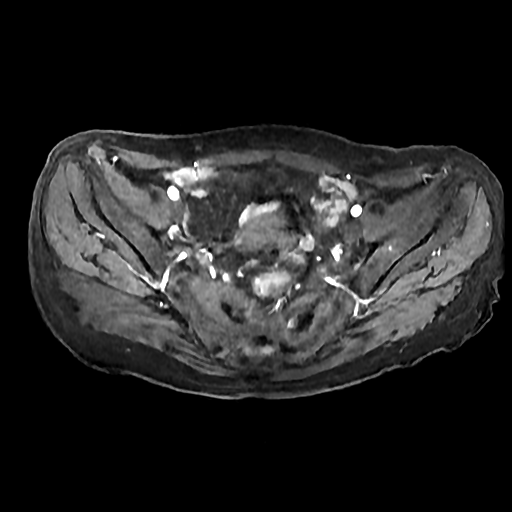
[im 110/110]
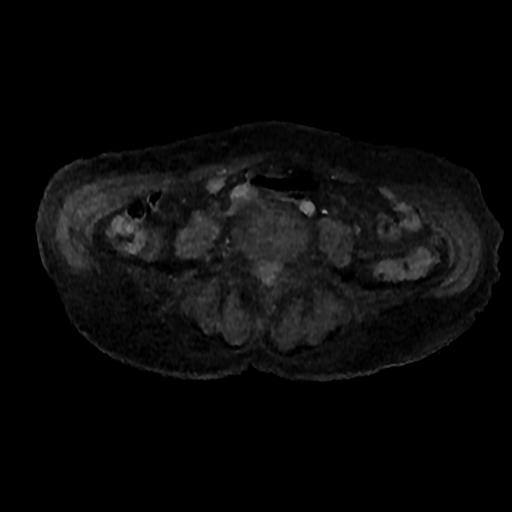

[Series 902: T1 dynamic · axial · 3.9mm · 0.61mm/px · 1 of 110 slices shown (3 of 3)]
[im 1/110]
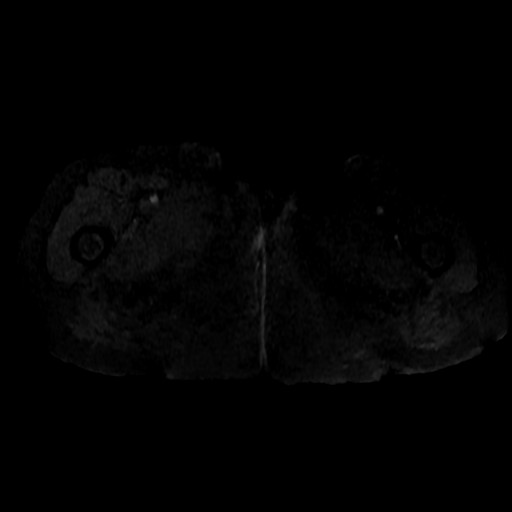

[22 of 48 positions shown; findings below may reference images not displayed]

RESSONÂNCIA MAGNÉTICA DE PELVE FEMININA

TÉCNICA:
Exame realizado em equipamento de ressonância magnética com sequências, ponderações e planos específicos para o segmento de interesse, antes e após a administração endovenosa do meio de contraste.

RESULTADO:
Espessamento parietal irregular semi circunferencial do reto baixo, com acometimento das 12- 10h, em íntimo contato com o canal anal, medindo cerca de 4,5 cm de extensão e distando cerca de 4,2 cm da borda anocutânea, com pequenas áreas de extensão extramural na parede posterolateral esquerda.
Linfonodos mesorretais com sinal semelhante ao tumoral, medindo até 0,9 cm no menor eixo axial.
Bexiga com forma e contornos normais, de paredes finas e lisas.
Útero em AVF, centrado, com dimensões e forma habitual.
Endométrio e zona juncional com espessura e sinal preservados. 
Miométrio com intensidade de sinal habitual. 
Ovários não individualizados.
Pequena quantidade de líquido na pelve.

CONCLUSÃO: 
Espessamento parietal irregular do reto baixo de provável natureza neoplásica.
Linfonodos mesorretais com sinal semelhante ao tumoral.
Pequena quantidade de líquido na pelve.

## 2023-11-23 ENCOUNTER — Encounter (HOSPITAL_BASED_OUTPATIENT_CLINIC_OR_DEPARTMENT_OTHER): Payer: Self-pay | Admitting: Dermatology

## 2023-11-29 NOTE — Telephone Encounter (Signed)
 Call to the patient with portuguese interpreter ID [phone], booked patient for appt with Dr Threasa Beards at 4 pm tomorrow.

## 2023-11-29 NOTE — Telephone Encounter (Signed)
 Marlowe Shores, MD  Adline Mango, RN; Sms Nurses Pool18 minutes ago (3:19 PM)       Can we ask if she can make it tomorrow for an appointment? Sorry for the short notice. Please keep her June appt.

## 2023-11-30 ENCOUNTER — Ambulatory Visit: Attending: Dermatology | Admitting: Dermatology

## 2023-11-30 ENCOUNTER — Other Ambulatory Visit: Payer: Self-pay

## 2023-11-30 DIAGNOSIS — D229 Melanocytic nevi, unspecified: Secondary | ICD-10-CM | POA: Diagnosis present

## 2023-11-30 DIAGNOSIS — L508 Other urticaria: Secondary | ICD-10-CM | POA: Insufficient documentation

## 2023-11-30 MED ORDER — HYDROXYZINE HCL 25 MG PO TABS
25.0000 mg | ORAL_TABLET | Freq: Two times a day (BID) | ORAL | 3 refills | Status: AC
Start: 2023-11-30 — End: 2024-03-29

## 2023-11-30 NOTE — Progress Notes (Signed)
 [facility]  [address] [place], [place] [address]    Dermatology Clinic Note    CC: f/u hives  ?  HPI: Stephanie [name] is a 63 year old female     Has several weeks history of a widespread rash. Not getting better. Biopsy was nonspecific, but could be c/w urticaria.    Her hives are not getting better. She is on famotidine and cetirizine.  She reports being seen by Allergy at [facility]. Is planning on starting omalizumab later this month.   ?  ROS: negative, no other skin complaints.  Feels well, no systemic complaints.   ?  Past Dermatologic hx:    SPEC #: [id number]        RECD: 03/28/23-2307  STATUS: SOUT  SP TYPE: DERM            COLL: 03/28/23-  SUB DR: Marlowe Shores MD     ENTERED:  03/28/23-2308     ORDERED:  [id number], 13086, 88342SOTC, HE, L1, L2, L3, PAS-Fungus     Performed at [facility], [facility]  [address] [place] [address] [phone]     >>FINAL DIAGNOSIS<<     A. SKIN, LEFT BREAST, PUNCH BIOPSY:  - Dermal edema, extravasated erythrocytes and a superficial perivascular  lymphocytic infiltrate with neutrophils and conspicuous eosinophils (see  note).     Note: Multiple levels examined. The findings are not entirely specific, but  may be compatible with the clinical impression of  urticaria/hypersensitivity reaction. No fungal organisms are identified  with a provided PASD stain. A spirochete stain is negative for T. pallidum.  Clinicopathologic correlation is advised.     After review of the entire case, Denman George. Cornejo MD of [facility] has rendered the above diagnosis, with which I concur.     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     Diagnosis by: Avie Arenas, MD            [facility] #: [id number]        RECD: 11/22/22-0713  STATUS: SOUT  SP TYPE: SOFTTISSUE      COLL: 11/19/22-  SUB DR: Lucienne Minks MD     ENTERED:  11/22/22-0713     ORDERED:  [id number], HE     Performed at [facility], [facility]  [address], [place] [address] [phone]     >>FINAL DIAGNOSIS<<     CYST, LEFT TEMPLE, EXCISION:  -Epidermal inclusion cyst     Diagnosis by: Luanne Bras, MD     CLINICAL HISTORY     Clinical history:  SOFT TISSUE LESION TO LEFT TEMPLE     SPEC #: [id number]        RECD: 09/28/22-1345  STATUS:  SOUT   SP TYPE: DERM            COLL: 09/27/22-   SUB DR: Marlowe Shores MD      ENTERED:  09/28/22-1345      ORDERED:  [id number], L1-L3/2, L4-L6/2      Performed at [facility], [facility]   [address] [place] [address] [phone]      >>FINAL DIAGNOSIS<<     SKIN, LEFT UPPER ARM, SHAVE BIOPSY:   Lentigo simplex.      Diagnosis by: Avie Arenas, MD      CLINICAL HISTORY      Preop diagnosis:   R/O ATYPICAL NEVUS      Surgical procedure:   SKIN SHAVE      GROSS DESCRIPTION      The specimen is received in formalin labeled with patient's name, unit   number and designated " left arm" and consists of a 1.3 x 1.0 cm tan-brown,   variegated, finely granular, irregular skin shave with no identifiable   lesion on the skin surface. The specimen is inked, step sectioned, and   entirely submitted in two cassettes labeled as follows:   ?  PMH:  Patient Active Problem List:     BMI 33.0-33.9,adult     Pure hypercholesterolemia     Family history of malignant neoplasm of gastrointestinal tract     Tubular adenoma of colon     Hyperopia with astigmatism  and presbyopia     Immature cataract     Hypertension, goal below 140/90     Adenomatous polyp of colon     Squamous 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 eyes     Alcohol abuse     Depression, recurrent (HCC)     Trauma and stressor-related disorder     Nontraumatic complete tear of right rotator cuff     Localized osteoarthritis of left knee     Obesity (BMI 30-39.9)     Chronic pain of left knee     Closed fracture of left distal radius     Fatty liver     Prediabetes     Surgical menopause     Generalized anxiety disorder     Eyelid lesion    ?  Meds:  ?     Medication List            Accurate as of December 21, 2021  2:03 PM. If you have any questions, ask your nurse or doctor.                CONTINUE taking these medications      dexamethasone 4 MG tablet  Commonly known as: DECADRON     Fish Oil 1000 MG Caps     Glucosamine 750 MG Tabs     Insulin Pen Needle 31G X 8 MM Misc  Inject under the skin     liraglutide 18 MG/3ML pen injector  Commonly known as: VICTOZA  Start 0.6mg  x 1 wk, then 1.2mg  x 1 wk and then 1.8mg  x 1 wk     losartan 25 MG tablet  Commonly known as: COZAAR  Take 1 tablet by mouth in the morning.     omeprazole 40 MG capsule  Commonly known as: PriLOSEC  Take 1 capsule by mouth in the morning.     traZODone 50 MG tablet  Commonly known as: DESYREL  TAKE 1 TABLET BY MOUTH NIGHTLY     Turmeric 500 MG  Caps            ?  All:  Review of Patient's Allergies indicates:  No Known Allergies?     SHx:  ?Social History    Tobacco Use      Smoking status: Former        Packs/day: 0.00        Types: Cigarettes        Quit date: 07/27/2001        Years since quitting: 22.3        Passive exposure: Past      Smokeless tobacco: Never    Alcohol use: Yes      Alcohol/week: 6.0 standard drinks of alcohol      Types: 6 Cans of beer per week      Comment: 6 beers per week     Drug use: No       FHx:  Hx of melanoma - , hx of NMSC -  ?  Physical exam/Assessment/Plan:  Well appearing pt in NAD  Mood and  affect are wnl  A skin examination was performed including part of arms, face, abdomen, L upper lateral arm  Skin type: III    # chronic urticaria. No rash today, but shows me photos of urticaria all over her upper body.   - treponemal antibody neg  - CBC, TSH, and ESR wnl 05/2023  - continue cetrizine 10mg   - Continue famotidine 20mg  QD  - Will add hydroxyzine 25mg  BID PRN. Advised this can make her drowzy and not to drive after taking this.   - Scheduled for first dose of omalizumab later this month, advised this is the next step to help control her chronic urticaria.     #Benign nevi, scattered on trunk and extremities: benign appearing brown macules and papules, most <39mm with no concerning features on exam or dermoscopy.   - Skin self-examination reviewed; ABCD's discussed.  - Sun protection and avoidance reviewed, including proper use of broad-spectrum UVB/UVA sunscreens with SPF 30 or greater, re-application after swimming and q 3-4 hours emphasized.?  Monitor:   - L upper arm 12x8 slightly dark scar like patch, favor scar, unchanged        RTC: June 2025  ?  ?  ?  Maudry Mayhew, MD  [facility]         CC: Mercer Pod, MD  [address]  [place] [address]

## 2023-12-02 IMAGING — MR RM ABD TT
14 of 34 series · 18 of 48 positions shown · IV contrast (10 GD)
Comparison: none

[Series 3: T2 · sagittal · 3.5mm · 0.24mm/px · 1 of 25 slices shown (1 of 6)]
[im 1/25]
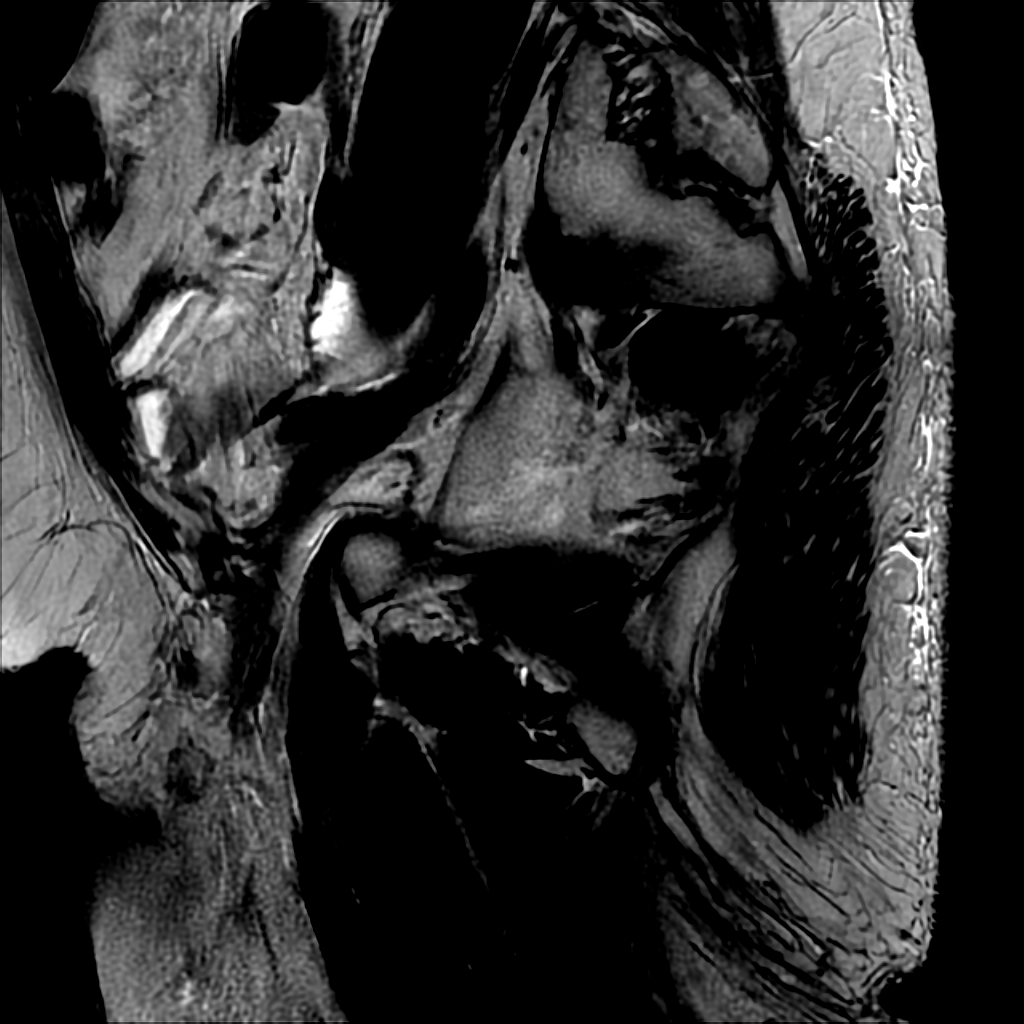

[Series 4: T1 · axial · 5.0mm · 0.59mm/px · 1 of 36 slices shown]
[im 1/36]
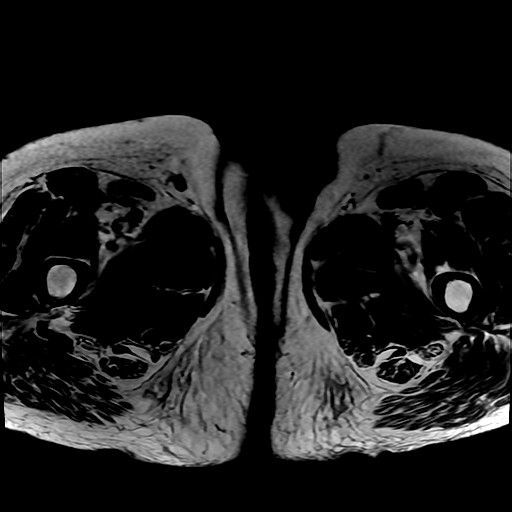

[Series 5: T2 · axial · 5.0mm · 0.59mm/px · 1 of 36 slices shown (2 of 6)]
[im 1/36]
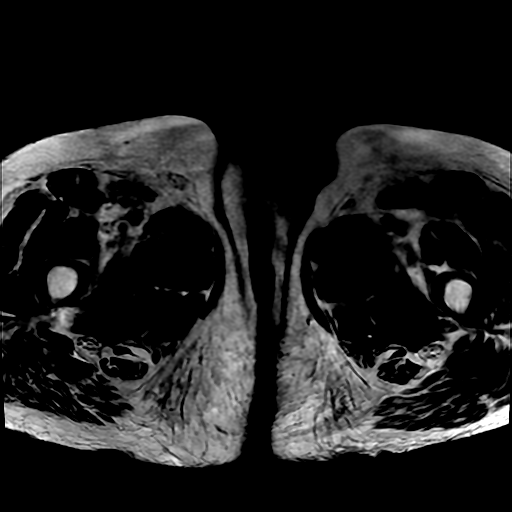

[Series 6: ax difusao · axial · 5.0mm · 1.17mm/px · 1 of 72 slices shown]
[im 1/72]
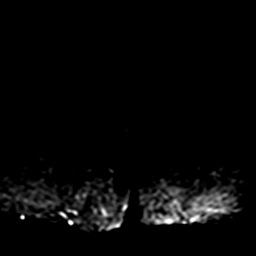

[Series 7: T2 · axial · 3.3mm · 0.35mm/px · 1 of 36 slices shown (3 of 6)]
[im 1/36]
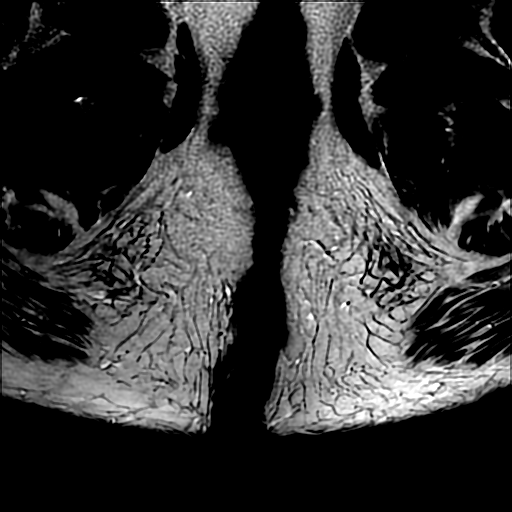

[Series 8: T2 · coronal · 3.0mm · 0.43mm/px · 1 of 26 slices shown (4 of 6)]
[im 1/26]
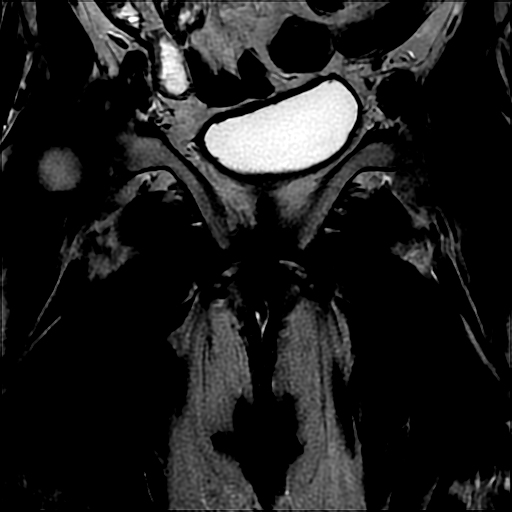

[Series 9: T1 dynamic · axial · non-contrast · 2.0mm · 0.59mm/px · z∈[-260,-41]mm · 3 of 220 slices shown (1 of 2)]
[im 1/220]
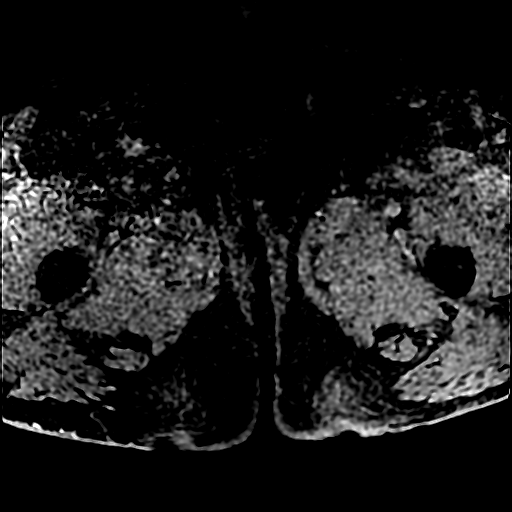
[im 110/220]
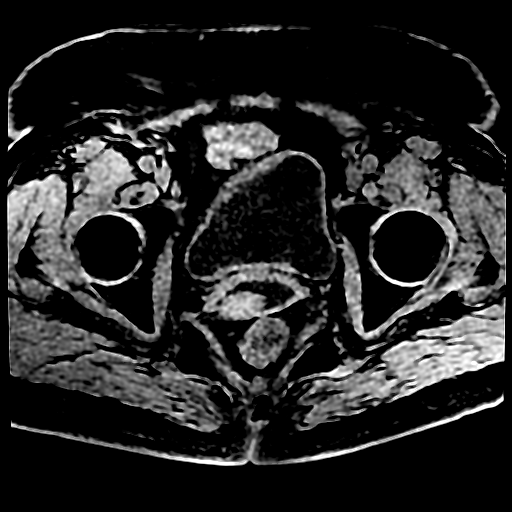
[im 220/220]
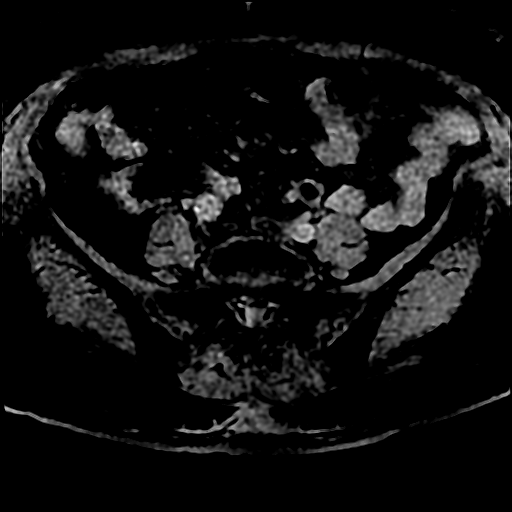

[Series 10: loc sup 34 · axial · 8.0mm · 1.88mm/px · 1 of 18 slices shown]
[im 1/18]
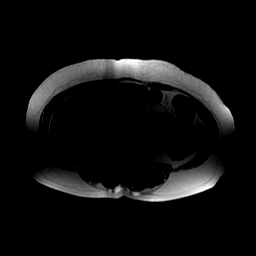

[Series 11: T2 · coronal · 6.0mm · 0.78mm/px · 1 of 30 slices shown (5 of 6)]
[im 1/30]
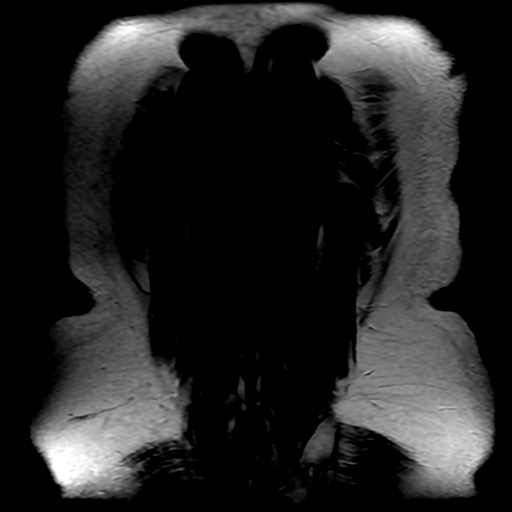

[Series 12: ax difusao sup · axial · 6.0mm · 1.64mm/px · 1 of 80 slices shown]
[im 1/80]
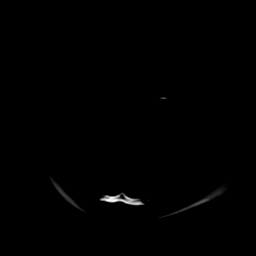

[Series 13: T2 · axial · 6.5mm · 0.82mm/px · 1 of 35 slices shown (6 of 6)]
[im 1/35]
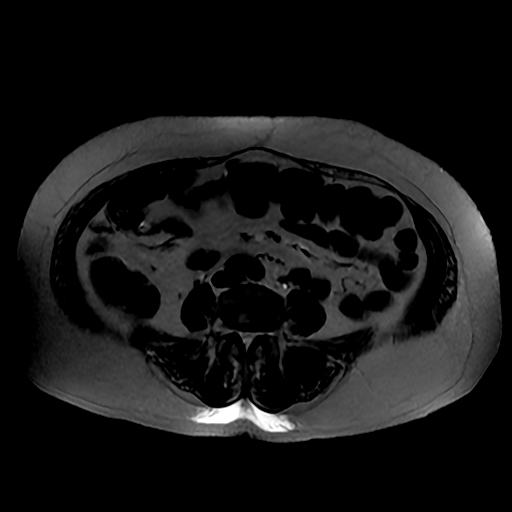

[Series 16: T2 fat-sat · axial · 6.5mm · 0.82mm/px · 1 of 35 slices shown]
[im 1/35]
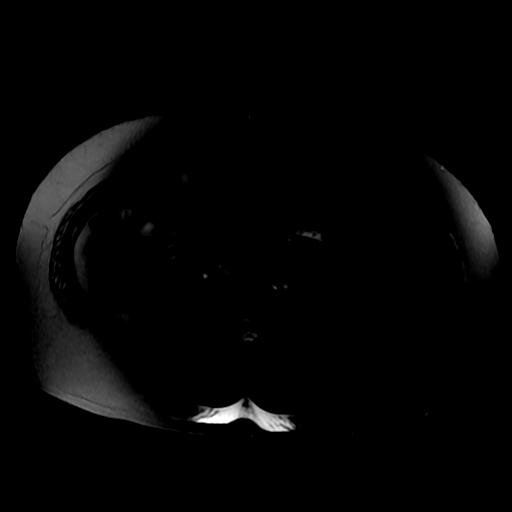

[Series 20: T1 dynamic · axial · 2.0mm · 0.59mm/px · z∈[-260,-41]mm · 3 of 220 slices shown (2 of 2)]
[im 1/220]
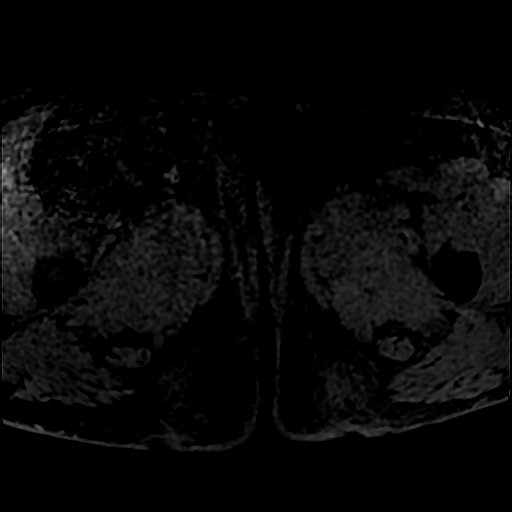
[im 110/220]
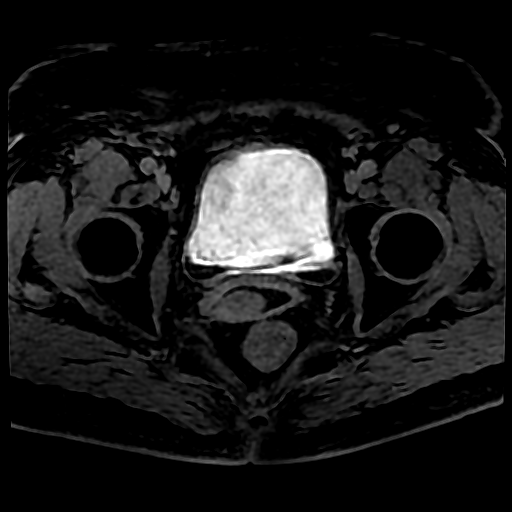
[im 220/220]
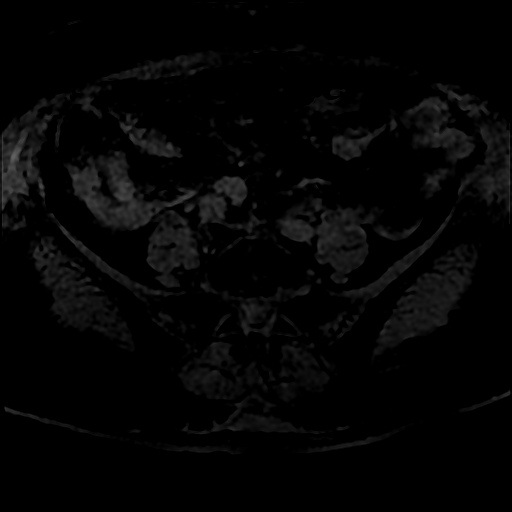

[Series 650: ADC · axial · 5.0mm · 1.17mm/px · 1 of 36 slices shown]
[im 1/36]
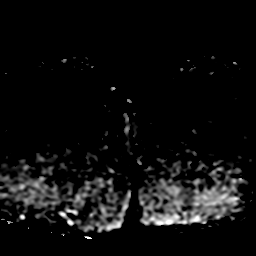

[18 of 48 positions shown; findings below may reference images not displayed]

RESSONÂNCIA MAGNÉTICA DE ABDOME TOTAL

TÉCNICA: 
Exame realizado em equipamento de ressonância magnética com sequências, ponderações e planos específicos para o segmento de interesse, antes e após a administração endovenosa do meio de contraste.

RESULTADO:
Planos musculares e estruturas ósseas com intensidade de sinal normal.
Fígado de contornos regulares e dimensões normais, apresentando perda de sinal na sequência fora de fase, caracterizando infiltração adiposa.
Veias porta e supra-hepáticas pérvias.
Não há dilatação da árvore biliar intra ou extra-hepática.
Vesícula biliar com forma e dimensões normais, sem evidências de cálculos no interior.
Baço sem alterações.
Pâncreas com forma normal e intensidade de sinal preservada.
Rins tópicos, com forma, dimensões e intensidade de sinal normal. Não há dilatação dos sistemas coletores. 
Glândula adrenal direita sem alterações.
Formação nodular de contornos regulares e bem definidos, isointensa em T1 e T2, com discreta impregnação homogênea pelo agente paramagnético, medindo 2,0 cm, localizada na adrenal esquerda.
Ausência de líquido ascítico. 
Grandes vasos abdominais de calibres preservados, sem evidências de linfonodomegalias ao redor.
Bexiga com forma e contornos normais, de paredes finas e lisas.
Útero em AVF, de contornos regulares e dimensões normais, apresentando miométrio homogêneo e endométrio linear.
Regiões anexiais anatômicas.
Espessamento parietal irregular do canal anal por cerca de 3,5 cm, predominantemente à direita, promovendo borramento perianal do mesmo lado, com extensão caudal até a borda anal.
Identifica-se com espessamento parietal cutâneo na fissura interglútea à direita.
As alterações descritas se comportam com hipo/hipersinal em T2 e discreta restrição à difusão, com impregnação heterogênea pelo agente paramagnético.
Linfonodos hipogástricos e obturatórios internos bilaterais, bem como inguinais bilateralmente, medindo menos de 1,0 cm.
Hipersinal difuso de L5 e do sacro, relacionado a reconversão medular actínica.

CONCLUSÃO:
Esteatose hepática moderada.
Nódulo indeterminado na adrenal esquerda.
Espessamento parietal do canal anal conforme descrito acima. Não recebemos exames anteriores para comparação, que poderia auxiliar na diferenciação entre alterações sequelares ou doença residual / recidivada.
Linfonodos hipogástricos e obturatórios internos bilaterais, bem como inguinais bilateralmente, medindo menos de 1,0 cm.
Reconversão medular actínica em L5 e no sacro.

## 2023-12-08 ENCOUNTER — Other Ambulatory Visit (HOSPITAL_BASED_OUTPATIENT_CLINIC_OR_DEPARTMENT_OTHER): Payer: Self-pay

## 2023-12-08 DIAGNOSIS — M25561 Pain in right knee: Secondary | ICD-10-CM

## 2023-12-09 ENCOUNTER — Other Ambulatory Visit (HOSPITAL_BASED_OUTPATIENT_CLINIC_OR_DEPARTMENT_OTHER): Payer: Self-pay

## 2023-12-09 ENCOUNTER — Ambulatory Visit
Admission: RE | Admit: 2023-12-09 | Discharge: 2023-12-09 | Disposition: A | Discharge status: Home / Self Care | Source: Ambulatory Visit

## 2023-12-09 ENCOUNTER — Ambulatory Visit (HOSPITAL_BASED_OUTPATIENT_CLINIC_OR_DEPARTMENT_OTHER): Admitting: Orthopaedic Surgery

## 2023-12-09 ENCOUNTER — Other Ambulatory Visit: Payer: Self-pay

## 2023-12-09 DIAGNOSIS — R21 Rash and other nonspecific skin eruption: Secondary | ICD-10-CM

## 2023-12-09 DIAGNOSIS — M810 Age-related osteoporosis without current pathological fracture: Secondary | ICD-10-CM

## 2023-12-09 DIAGNOSIS — M1711 Unilateral primary osteoarthritis, right knee: Secondary | ICD-10-CM | POA: Insufficient documentation

## 2023-12-09 DIAGNOSIS — Z96652 Presence of left artificial knee joint: Secondary | ICD-10-CM | POA: Insufficient documentation

## 2023-12-09 DIAGNOSIS — M25562 Pain in left knee: Secondary | ICD-10-CM

## 2023-12-09 DIAGNOSIS — M25561 Pain in right knee: Secondary | ICD-10-CM

## 2023-12-09 DIAGNOSIS — G8929 Other chronic pain: Secondary | ICD-10-CM | POA: Insufficient documentation

## 2023-12-09 MED ORDER — PREDNISONE 10 MG PO TABS
ORAL_TABLET | ORAL | 0 refills | Status: AC
Start: 2023-12-09 — End: 2023-12-23

## 2023-12-09 NOTE — Progress Notes (Unsigned)
 Orthopedic Office Note    CC: Patient presents with:  Follow Up    ORTHOPEDIC PROBLEM LIST:  1. S/p left distal radius ORIF, DOS: 04/18/2022  2. S/p left TKA, DOS: 10/12/21  3. Right knee osteoarthritis   4. Bilateral arm and leg pain   5. Right wrist pain   6.  Rash/urticaria  7.  Osteoporosis    HPI: Stephanie [name] is a 63 year old female who presents with bilateral knee pain .  She is 2 years s/p Left TKR.  She doesn't have pain in the knee, but does have some pain in the shin below the knee.   She also is having right knee pain due to OA which is affecting her ability to work.  Pain causes her to cry sometimes. The pain is most severe on workdays, which are Tuesday, Wednesday, and Thursday, and improves on non-working days.     We injected her right knee last in March 2024 which was helpful for a while. She is not currently taking any pain medication due to allergies but was previously on a medication from [place], the name of which she cannot recall. She has been experiencing a total body rash for about six months, causing itching.. These reactions occur with both food and medications,so she stopped all medications in an effort to remove the offending agent.  She has not change her laundry detergent.. She has consulted with an allergist and a dermatologist but reports dissatisfaction with the allergist. She continues to see a dermatologist for her moles every two months. She has tried different creams and medications as prescribed, but they have not helped.    She has a history of osteoporosis and is on Fosamax.  She denies any problems taking this.  She has stopped it now due to her concerns about her rash.    In regards to her left wrist, this is also doing very well. She has minimal pain in the wrist.      IMAGING:     I personally reviewed and interpreted the imaging during this visit.      Right knee films taken today 11/2023 show degenerative changes most prominently of the medial compartment including joint  space narrowing, subchondral sclerosis, marginal osteophytosis, and lateral translocation of the tibia.   X-ray of the bilateral standing knees from 09/24/2022 shows bone on bone medial compartment narrowing of the right knee.  Total knee replacement present on the left knee.    Left knee films were last done 10/2022 and look fine without signs of loosening.    Bone density test 10/2022 shows lowest measured BMD with a T-score of -4.4     Bone density in the AP lumbar spine measured at L1-L3 is as follows:     BMD = 0.613 g/cm2  T-score = -4.4  Z-score = -2.8     Bone density in the left femoral neck is as follows:     BMD = 0.546 g/cm2  T-score = -2.7  Z-score = -1.4     Total left hip bone density is as follows:     BMD = 0.611 g/cm2  T-score = -2.7  Z-score = -1.7     Bone density in the right femoral neck is as follows:     BMD = 0.537 g/cm2  T-score = -2.8  Z-score = -1.4     Total right hip bone density is as follows:     BMD = 0.602 g/cm2  T-score = -2.8  Z-score = -1.7  Imaging was personally reviewed by myself and Dr. Lazarus Primer today.    PHYSICAL EXAM:  GENERAL: Alert and oriented, in no acute distress  MOOD: Appropriate.   MUSCULOSKELETAL:   Examination of the right knee shows varus deformity. Overlying skin is warm, dry, and intact. There is no erythema, edema, or ecchymosis. Calf is soft and nontender. Neurovascularly intact distally.    Left knee with normal alignment.  Well healed anterior scar.  No tenderness about knee.  No swelling.  Knee is stable.  Some tenderness upper tibia.  ROM 0-120.  NVI.    Skin:  she does have maculopapular lesions about 5-10 mm in size all over her stomach and arms.  They are slightly pink.  Irregular borders.    ASSESSMENT/PLAN: 63 year old female who presents today for follow-up ofher knees.  Both bother her, but the left knee pain is actually in the shin.  Exam is benign.  Right knee films show severe OA.  Left knee films were done a year ago and look fine.    Discussed  injection into R knee.  She is reluctant to pursue any intervention due to her rash.    Did propose Prednisone to address knee pain and potential allergic reactions. She agreed to try this.  I prescribed a prednisone taper: 4 pills per day for 3 days, then 3 pills per day for 3 days, then 2 pills per day for 3 days, and finally 1 pill per day for 3 days.     She inquired about future surgery for the right knee, preferring it at the end of the year to coincide with a family member's surgery. She is defnitely a candidate for R TKR.    In regards to her rash, she has seen a dermatologist and an allergist but has not had any relief.  Will see how prednisone does.    I will see her back in 2 months.    XOA Left knee    Stephanie Montane, MD, 12/09/2023          Nursing Communication:    XOA left knee and standing films

## 2023-12-11 ENCOUNTER — Encounter (HOSPITAL_BASED_OUTPATIENT_CLINIC_OR_DEPARTMENT_OTHER): Payer: Self-pay | Admitting: Family Medicine

## 2023-12-12 ENCOUNTER — Telehealth (HOSPITAL_BASED_OUTPATIENT_CLINIC_OR_DEPARTMENT_OTHER): Payer: Self-pay | Admitting: Family Medicine

## 2023-12-12 NOTE — Telephone Encounter (Signed)
 LVM apt reminder april 16th at 11 am wiw pcp

## 2023-12-14 ENCOUNTER — Ambulatory Visit: Admitting: Family Medicine

## 2023-12-17 ENCOUNTER — Encounter (HOSPITAL_BASED_OUTPATIENT_CLINIC_OR_DEPARTMENT_OTHER): Payer: Self-pay | Admitting: Family Medicine

## 2023-12-26 ENCOUNTER — Encounter (HOSPITAL_BASED_OUTPATIENT_CLINIC_OR_DEPARTMENT_OTHER): Payer: Self-pay | Admitting: Family Medicine

## 2023-12-26 ENCOUNTER — Ambulatory Visit: Attending: Family Medicine | Admitting: Family Medicine

## 2023-12-26 ENCOUNTER — Ambulatory Visit: Admitting: Family Medicine

## 2023-12-26 DIAGNOSIS — L508 Other urticaria: Secondary | ICD-10-CM | POA: Insufficient documentation

## 2023-12-26 DIAGNOSIS — Z6833 Body mass index (BMI) 33.0-33.9, adult: Secondary | ICD-10-CM | POA: Diagnosis present

## 2023-12-26 NOTE — Progress Notes (Signed)
 Subjective   Stephanie [name] is a 63 year old female who presents for chronic urticaria, BMI mgmt   History of Present Illness    Her tirzepatide  injection ran out approximately two weeks ago. She had been prescribed this medication during her last visit. She was not aware that her health insurance, [employer], no longer covers weight loss medications as of April 1st. We discussed the posibility of OOP via [employer], however,cost of paying out of pocket prohibitive.    She has ongoing issues with idiopathic urticaria. She previously visited an allergy  specialist who did not perform specific allergy  testing which she was wondering about. She is unsure of the cause of her allergies and is seeking further evaluation. She is being evaluated for treatment with Xolair, prescribed by her allergist for idiopathic urticaria. On deeper exploration of her urticarial potential etiologies, we discussed she has no recent use of nonsteroidal anti-inflammatory drugs such as ibuprofen  or naproxen , and has no known physical or environmental triggers. She is interested in other treatments or therapies to manage the urticaria     Review of Systems     Objective   Last menstrual period 07/11/2005, not currently breastfeeding.  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 Discussed allergen testing is not revealing for chronic urticaria which is usally a chronicproblem which eventially resolved rather than a true allergy . Continue Xolair as prescribed for idiopathic urticaria. Discussed pseudoallergen avoidance as discussed in up todate as potential intervention  - Sent pseudoallergen diet information via MyChart.    Obesity  Tirzepatide  no longer covered by [employer], cost prohibitive. Emphasized importance of healthy diet  and exercise for overall health.        There are no diagnoses linked to this encounter.    Attestation      The patient verbally consented to an audio recording of their visit to assist with the completion of documentation. The patient is aware the recording is not retained after the visit is summarized.

## 2024-01-18 ENCOUNTER — Ambulatory Visit (HOSPITAL_BASED_OUTPATIENT_CLINIC_OR_DEPARTMENT_OTHER): Payer: Self-pay

## 2024-01-18 NOTE — Telephone Encounter (Signed)
 Adult Dermatology Triage  I spoke with Patient  Chief Complaint: multiple lump on neck and and leg    Current day of symptoms: 1 month on neck, week on legs      Patient Active Problem List:     BMI 33.0-33.9,adult     Pure hypercholesterolemia     Family history of malignant neoplasm of gastrointestinal tract     Tubular adenoma of colon     Hyperopia with astigmatism and presbyopia     Immature cataract     Hypertension, goal below 140/90     Adenomatous polyp of colon     Squamous 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 eyes     Alcohol abuse     Depression, recurrent (HCC)     Trauma and stressor-related disorder     Nontraumatic complete tear of right rotator cuff     Localized osteoarthritis of left knee     Obesity (BMI 30-39.9)     Chronic pain of left knee     Closed fracture of left distal radius     Fatty liver     Prediabetes     Surgical menopause     Generalized anxiety disorder     Eyelid lesion        Review of Patient's Allergies indicates:  No Known Allergies  Emergency Care:     Is the patient gasping for air or unable to speak?  No    Does the patient have new severe chest pain? No    Is the patient lethargic or does the patient have altered mental status? No    The patient has the following symptoms:     Fever: Denies  If fever, duration of fever: N/A  Location of rash/condition? Neck and on both legs, size of corn 3 spots in total    The rash/spot/lump or bump is localized in distribution, and can be described as bumps and firm , like the lumps she had removed around the eyes. Not painful no itchy.   Worsening symptoms from baseline: Reports    Recent known exposures to illness? No  Recent travel? No  New medications, lotions, detergents, soaps? No  Other symptoms/pertinent information: unrelated itchy rash ongoing and working with derm   Home remedies tried:      Advised per nursing triage protocol.  only wants to see PCP. Given no s/s of infection, no pain and no itching scheduled next  available with PCP      Recommended disposition for patient:  Disposition: See in Office within 3 days     If patient referred to UC/ED advised that they may require further follow up and testing after the visit with their primary care office.      Instructed patient to call back for any new, worsening, or worrisome symptoms or concerns any time day or night.    Telephone Call Outcome:  Single Call Resolution        Reason for Disposition   Small swelling or lump present > 1 week    Protocols used: Skin Lump or Localized Swelling-A-OH

## 2024-01-30 ENCOUNTER — Ambulatory Visit: Admitting: Family Medicine

## 2024-02-01 NOTE — Progress Notes (Deleted)
 SUBJECTIVE    Daana Givhan's main goal for today's visit is to address skin issue     #) Skin issue  -      OBJECTIVE    LMP 07/11/2005   Pain Score: Data Unavailable        Most Recent BP Reading(s)  03/28/23 : 115/75  03/18/23 : 133/78  02/09/23 : 127/82    Most Recent Weight Reading(s)  07/13/23 : 69.7 kg (153 lb 9.6 oz)  03/28/23 : 70.9 kg (156 lb 3.2 oz)  03/18/23 : 85.3 kg (188 lb)                           ASSESSMENT AND PLAN:  Lindia Garms is a 63 year old female with:    No diagnosis found.    Vaccines offered today and patient ***    {Document time spent to support E&M coding:12984}    Augustus Blood, PA-C, 02/01/2024

## 2024-02-03 ENCOUNTER — Telehealth (HOSPITAL_BASED_OUTPATIENT_CLINIC_OR_DEPARTMENT_OTHER): Payer: Self-pay | Admitting: Family Medicine

## 2024-02-03 ENCOUNTER — Ambulatory Visit: Admitting: Family Medicine

## 2024-02-03 NOTE — Telephone Encounter (Signed)
 Central Refill Department to complete a benefit analysis for the PCV20 Vaccine.    The vaccine is covered under the patient's [facility] medical coverage.    Please choose Private

## 2024-02-04 IMAGING — MR RM PELVE COM CONTRASTE (não inclui articulações coxofemorais)
22 of 48 series · 22 of 48 positions shown · IV contrast (Y)
Comparison: none

[Series 2: T2 · coronal · 5.0mm · 0.68mm/px · 1 of 28 slices shown (1 of 6)]
[im 1/28]
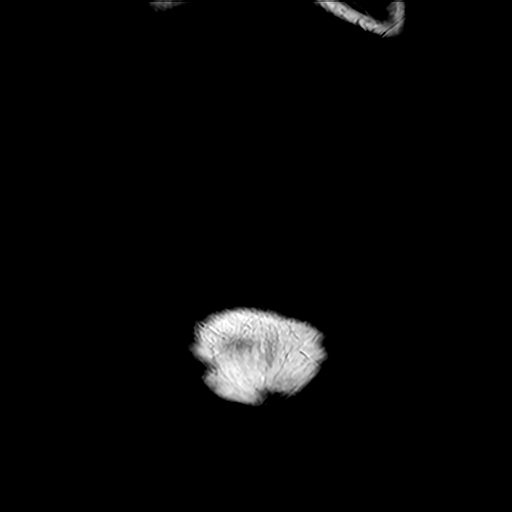

[Series 3: T2 · sagittal · 4.0mm · 0.55mm/px · 1 of 28 slices shown (2 of 6)]
[im 1/28]
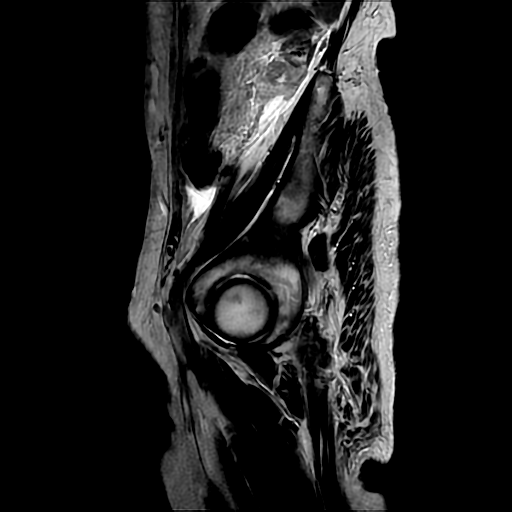

[Series 4: T2 · axial · 5.0mm · 0.68mm/px · 1 of 26 slices shown (3 of 6)]
[im 1/26]
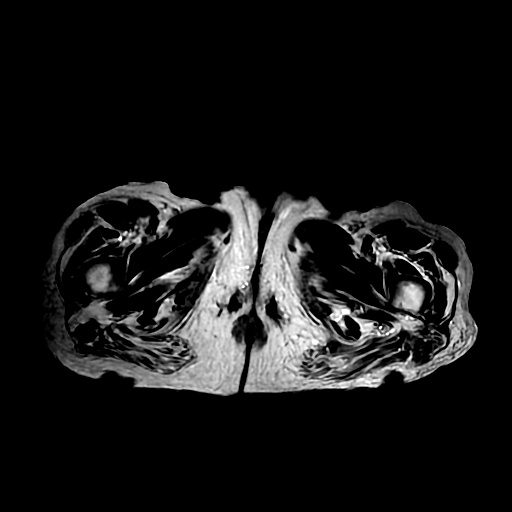

[Series 5: FLAIR · axial · 5.0mm · 0.68mm/px · 1 of 26 slices shown]
[im 1/26]
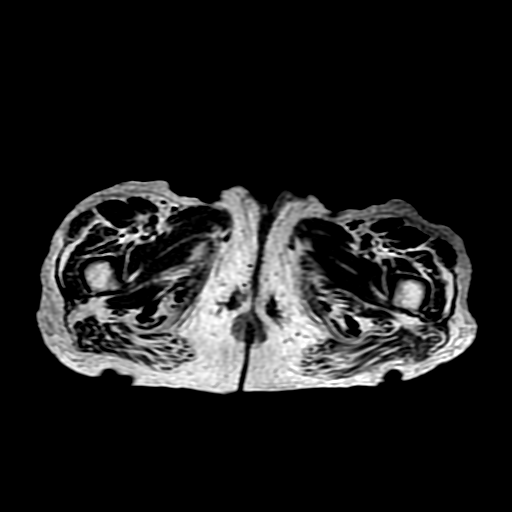

[Series 6: DWI · axial · 5.0mm · 1.37mm/px · 1 of 51 slices shown (1 of 2)]
[im 1/51]
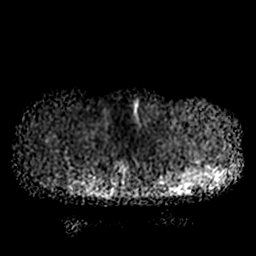

[Series 7: ax dual echo · axial · 5.0mm · 0.68mm/px · 1 of 52 slices shown]
[im 1/52]
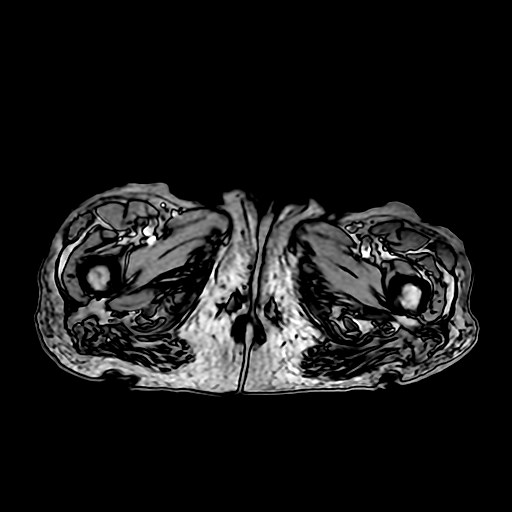

[Series 9: T2 · coronal · 5.5mm · 0.76mm/px · 1 of 28 slices shown (4 of 6)]
[im 1/28]
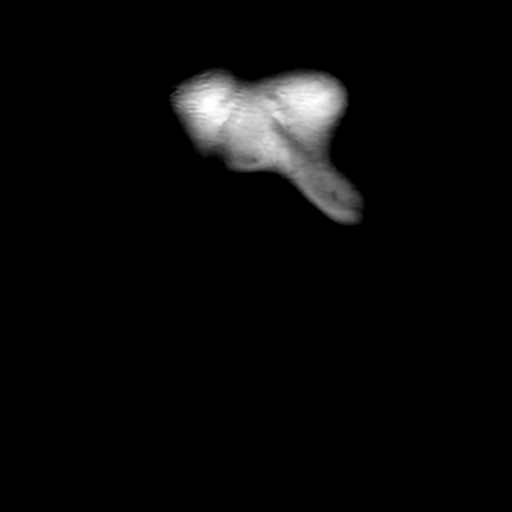

[Series 10: T2 · axial · 7.0mm · 0.82mm/px · 1 of 30 slices shown (5 of 6)]
[im 1/30]
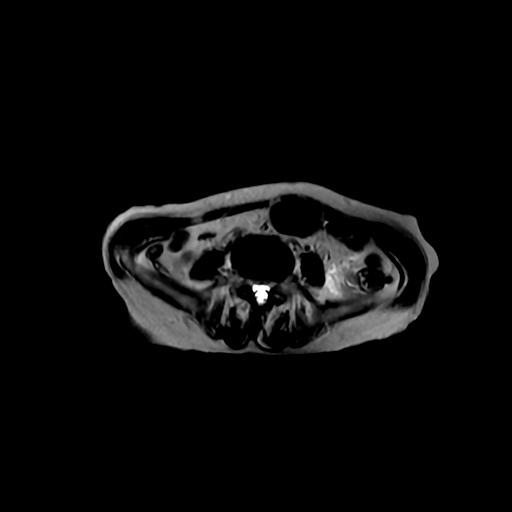

[Series 11: T2 · axial · 7.0mm · 0.82mm/px · 1 of 30 slices shown (6 of 6)]
[im 1/30]
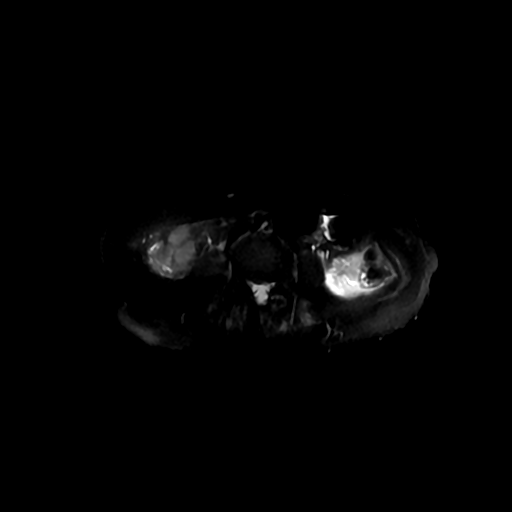

[Series 12: DWI · axial · 7.0mm · 1.64mm/px · 1 of 60 slices shown (2 of 2)]
[im 1/60]
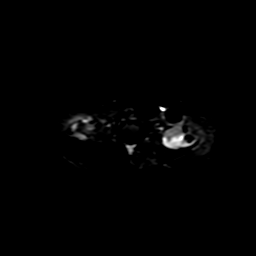

[Series 13: ax 2d dualecho · axial · 7.0mm · 0.82mm/px · 1 of 60 slices shown]
[im 1/60]
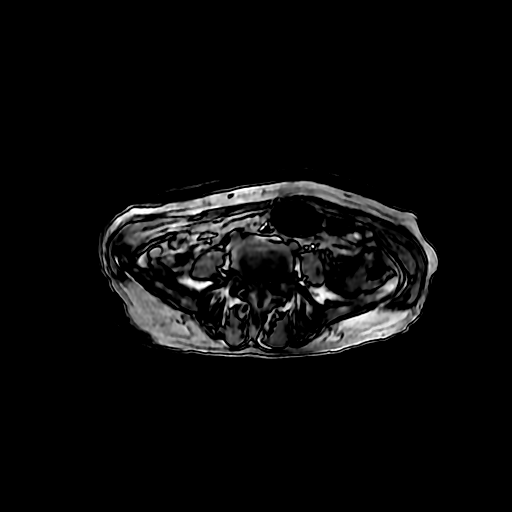

[Series 16: T1 dynamic · sagittal · 4.7mm · 0.59mm/px · 1 of 70 slices shown (1 of 7)]
[im 1/70]
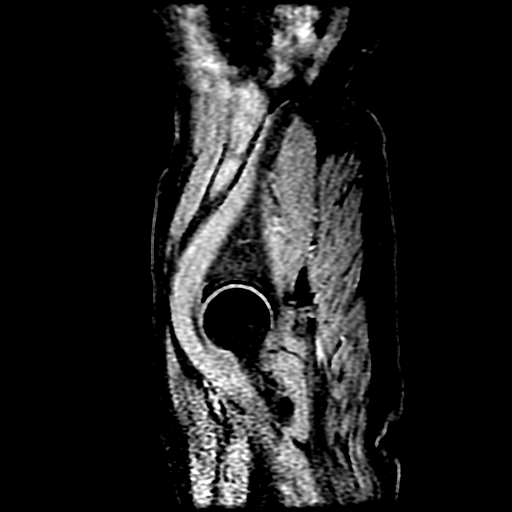

[Series 650: ADC · axial · 5.0mm · 1.37mm/px · 1 of 26 slices shown (1 of 2)]
[im 1/26]
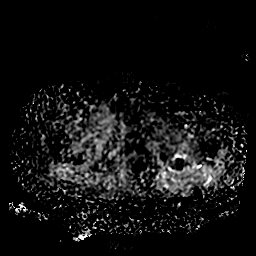

[Series 651: eadc · axial · 5.0mm · 1.37mm/px · 1 of 26 slices shown]
[im 1/26]
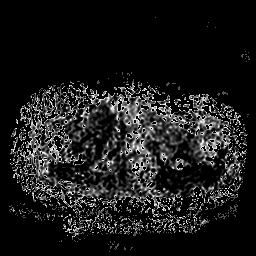

[Series 1250: ADC · axial · 7.0mm · 1.64mm/px · 1 of 30 slices shown (2 of 2)]
[im 1/30]
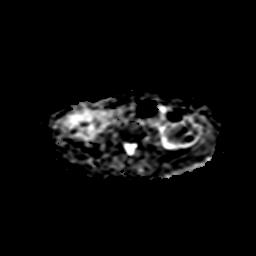

[Series 1400: T1 dynamic · axial · 4.3mm · 0.78mm/px · 1 of 110 slices shown (2 of 7)]
[im 1/110]
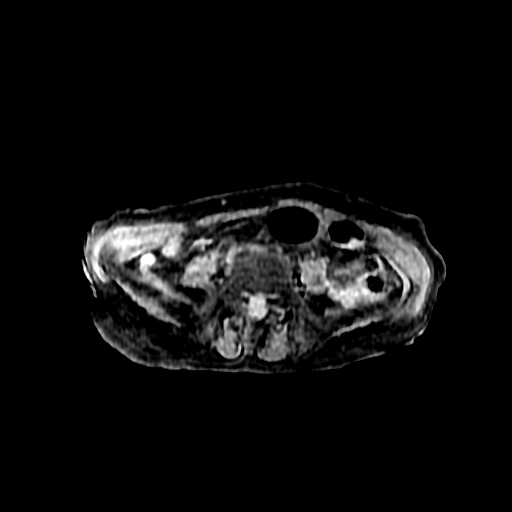

[Series 1401: T1 dynamic · axial · 4.3mm · 0.78mm/px · 1 of 110 slices shown (3 of 7)]
[im 1/110]
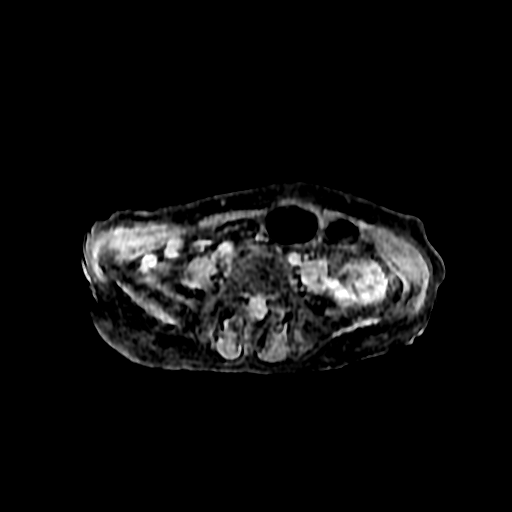

[Series 1402: T1 dynamic · axial · 4.3mm · 0.78mm/px · 1 of 110 slices shown (4 of 7)]
[im 1/110]
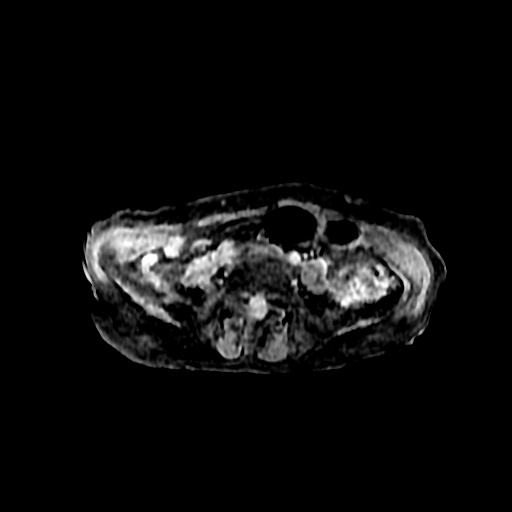

[Series 1403: T1 dynamic · axial · 4.3mm · 0.78mm/px · 1 of 110 slices shown (5 of 7)]
[im 1/110]
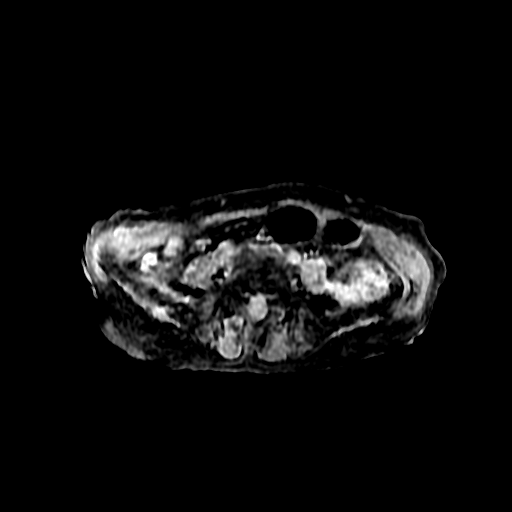

[Series 1404: processed images · axial · 2.2mm · 0.70mm/px · 1 of 22 slices shown]
[im 1/22]
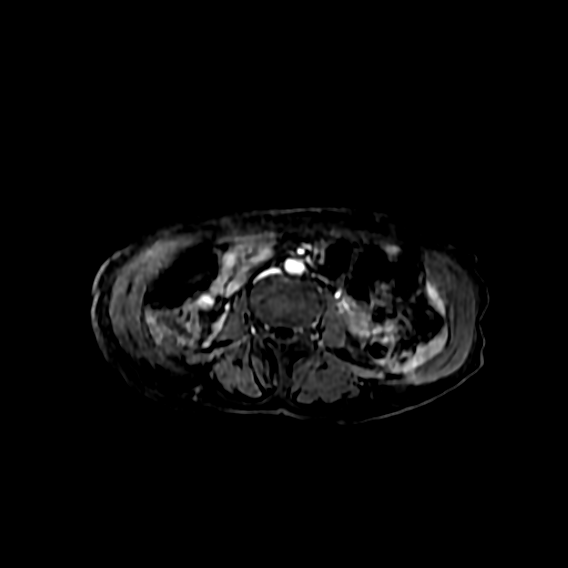

[Series 1425: T1 dynamic · axial · 4.3mm · 0.78mm/px · 1 of 109 slices shown (6 of 7)]
[im 1/109]
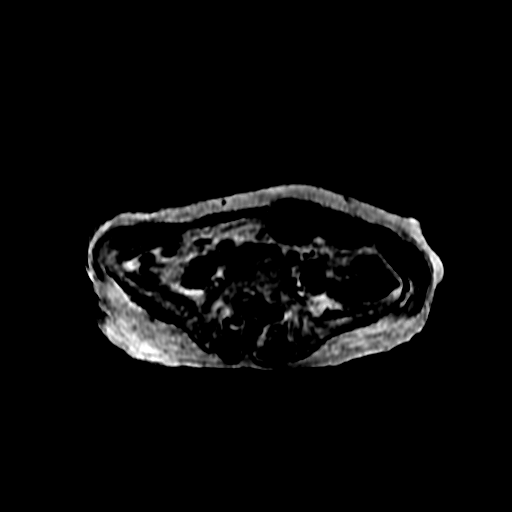

[Series 1426: T1 dynamic · axial · 4.3mm · 0.78mm/px · 1 of 103 slices shown (7 of 7)]
[im 1/103]
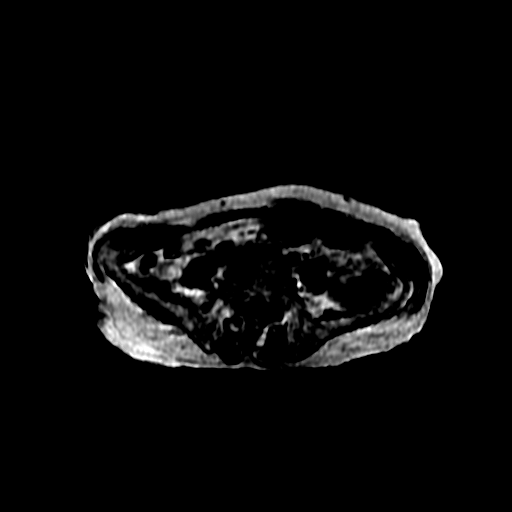

[22 of 48 positions shown; findings below may reference images not displayed]

RESSONÂNCIA MAGNÉTICA DE PELVE

TÉCNICA:
Exame realizado em equipamento de ressonância magnética com sequências, ponderações e planos específicos para o segmento de interesse, antes e após a administração endovenosa do meio de contraste.

RESULTADO:
Bexiga com boa repleção, paredes finas e conteúdo de sinal homogêneo.
Canal vaginal de paredes regulares. Colo uterino preservado.
Útero em anteversoflexão, centralizado. Mede 5,9 x 3,1 x 2,3 cm, com volume estimado em 21,9 cm³.
Miométrio com intensidade de sinal habitual, sem definição de nódulos.
Zona juncional regular, com espessura mantida e intensidade de sinal habitual.
Endométrio homogêneo e centrado, com espessura de 0,2 cm.
Espaços vesicuterino, retrocervical e do septo retovaginal livres.
Ovários não caracterizados. Não há definição de formações patológicas nas regiões anexiais correspondentes.
Formação expansiva semi-anular, com restrição à difusão, sem conteúdo mucinoso, acometendo a ampola retal, distando 5,0 cm da borda anal e margem distal 1,5 cm acima do anel anorretal. Estende-se por 4,8 cm e encontra-se abaixo da reflexão peritoneal. Existem estriações na interface gordurosa perirretal, sem sinais inequívocos de extensão extramural dentro das limitações do protocolo.
Canal anal de configuração anatômica.
Ausência de linfonodomegalias pélvicas.
Mínima quantidade de líquido livre na escavação e no espaço pré-sacral.

CONCLUSÃO: 
Formação expansiva semi-anular na ampola retal relacionada a processo neoplásico primário.
Demais achados descritos no corpo do laudo.

## 2024-02-10 ENCOUNTER — Ambulatory Visit (HOSPITAL_BASED_OUTPATIENT_CLINIC_OR_DEPARTMENT_OTHER): Attending: Orthopaedic Surgery | Admitting: Orthopaedic Surgery

## 2024-02-10 ENCOUNTER — Other Ambulatory Visit: Payer: Self-pay

## 2024-02-10 ENCOUNTER — Ambulatory Visit: Payer: No Typology Code available for payment source | Admitting: Dermatology

## 2024-02-10 ENCOUNTER — Ambulatory Visit (HOSPITAL_BASED_OUTPATIENT_CLINIC_OR_DEPARTMENT_OTHER): Admit: 2024-02-10 | Discharge: 2024-02-10 | Disposition: A | Discharge status: Home / Self Care

## 2024-02-10 ENCOUNTER — Ambulatory Visit
Admission: RE | Admit: 2024-02-10 | Discharge: 2024-02-10 | Disposition: A | Discharge status: Home / Self Care | Source: Ambulatory Visit | Attending: Diagnostic Radiology | Admitting: Diagnostic Radiology

## 2024-02-10 DIAGNOSIS — Z96652 Presence of left artificial knee joint: Secondary | ICD-10-CM | POA: Insufficient documentation

## 2024-02-10 DIAGNOSIS — M21161 Varus deformity, not elsewhere classified, right knee: Secondary | ICD-10-CM | POA: Insufficient documentation

## 2024-02-10 DIAGNOSIS — M25561 Pain in right knee: Secondary | ICD-10-CM | POA: Diagnosis present

## 2024-02-10 DIAGNOSIS — M1711 Unilateral primary osteoarthritis, right knee: Secondary | ICD-10-CM | POA: Insufficient documentation

## 2024-02-10 DIAGNOSIS — M25562 Pain in left knee: Secondary | ICD-10-CM | POA: Insufficient documentation

## 2024-02-10 DIAGNOSIS — G8929 Other chronic pain: Secondary | ICD-10-CM

## 2024-02-10 MED ORDER — MELOXICAM 15 MG PO TABS
15.0000 mg | ORAL_TABLET | Freq: Every day | ORAL | 1 refills | Status: DC
Start: 2024-02-10 — End: 2024-04-02

## 2024-02-10 NOTE — Progress Notes (Addendum)
 Orthopedic Office Note    CC: Patient presents with:  Follow Up    ORTHOPEDIC PROBLEM LIST:  1. S/p left distal radius ORIF, DOS: 04/18/2022  2. S/p left TKA, DOS: 10/12/21  3. Right knee osteoarthritis   4. Bilateral arm and leg pain   5. Right wrist pain   6.  Rash/urticaria  7.  Osteoporosis    HPI: Stephanie [name] is a 63 year old female who presents with bilateral knee pain. She is over 2 years s/p Left TKR.  Today she states an increase in her left knee pain.  No new injury or trauma.  Patient did well initially after her surgery but now feels some posterior pain..  She states that the knee bothers her most when walking or when going down stairs.  No pain when ascending stairs.  She reports that she has constant swelling of her knee.  At times she feels that the knee pops out of place and states that this is very painful. However, her left knee feels much better than it did before surgery.    She also continues to have right knee pain due to OA which is affecting her ability to work.  Pain causes her to cry sometimes.  States that she feels and hears cracking in her right knee.  She admits that the right knee feels similar to how the left knee felt prior to surgery.  Pain with walking and with climbing stairs.  She is not taking anything for pain.  She did have a cortisone injection into the right knee on 11/12/2022 and she states that this was not helpful.  She does not wish to repeat injections today.  She is hopeful to do a right knee replacement in November or December.    She is taking alendronate  once a week for osteoporosis. 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    IMAGING: Right knee films taken 4/11/'2025 show degenerative changes most prominently of the medial compartment including joint space narrowing, subchondral sclerosis, marginal osteophytosis, and lateral translocation of the tibia.   X-ray of the bilateral standing knees from  today shows bone on bone medial  compartment narrowing of the right knee.  Total knee replacement present on the left knee.  X-ray of the left knee taken today showing a left knee prosthesis in good alignment with no signs of failure.     Imaging was interpreted by myself and Dr. Lazarus Primer today.    Bone density test 10/2022 shows lowest measured BMD with a T-score of -4.4     Bone density in the AP lumbar spine measured at L1-L3 is as follows:     BMD = 0.613 g/cm2  T-score = -4.4  Z-score = -2.8     Bone density in the left femoral neck is as follows:     BMD = 0.546 g/cm2  T-score = -2.7  Z-score = -1.4     Total left hip bone density is as follows:     BMD = 0.611 g/cm2  T-score = -2.7  Z-score = -1.7     Bone density in the right femoral neck is as follows:     BMD = 0.537 g/cm2  T-score = -2.8  Z-score = -1.4     Total right hip bone density is as follows:     BMD = 0.602 g/cm2  T-score = -2.8  Z-score = -1.7  Imaging was personally reviewed and interpreted by myself  and Dr. Lazarus Primer today.    PHYSICAL EXAM:  GENERAL: Alert and oriented, in no acute distress  MOOD: Appropriate.   MUSCULOSKELETAL:   Examination of the right knee shows varus deformity. Overlying skin is warm, dry, and intact. There is no erythema, edema, or ecchymosis.  TTP over the medial and lateral joint lines.  Crepitus noted with knee flexion and extension.  ROM 0-120.  Strength intact.  Stable to varus and valgus stress.  Calf is soft and nontender. Neurovascularly intact distally.    Left knee with normal alignment.  Well healed anterior scar.  Overlying skin is warm, dry, and intact.  There is no erythema, edema, or ecchymosis.  TTP over the posterior knee and medial joint line. NTTP over the patella. Patella tracks well.  Knee is stable to varus and valgus stress. ROM 0-120.  Strength intact.  NVI.    ASSESSMENT/PLAN: 63 year old female who presents today for follow-up for her bilateral knees.  She is over 2 years out from her left-sided total knee replacement and is  still admitting to some pain however much improved since the surgery. On exam the knee is stable. X-rays show no evidence of hardware failure.  At the moment she is not taking anything for the pain.  She was prescribed meloxicam  in the past and states that this was very helpful.  New prescription sent to her pharmacy. She has severe right knee medial compartment OA and is hoping to proceed with a right total knee replacement in December before the holiday. Case request placed. She is aware that our secretary will reach out to her with an appointment for a joint class and CT scan. We will see her back closer to her surgery date for a pre-op appointment.  Offered cortisone injection for the right knee today however patient would not like to proceed with this as her last one did not provide relief.  She is aware that she will need PCP clearance.   Patient understands and agrees with plan.      Lenny Rack, PA-C, 02/10/2024     Patient seen and examined with Lenny Rack, PA-C.  This note was written by Alisa App and edited by me so that it reflects my findings, assessment and plan. I agree with the note. I spent more than 50% of joint visit with the patient and did the substantive part of the visit and decision making.    She has severe osteoarthritis in her right knee, with progressively worsening symptoms. She is hoping to undergo a knee replacement for the right knee in December. She does not wish to receive a cortisone injection for the right knee pain as previous injection was not helpful.    She is having some pain in left knee.  She actually feels that something in the front is popping in and out and this is painful.  Happens 2-3 times a week when she is sitting walking TV with her left knee bent. She doesn't move to have it pop out.     She previously used meloxicam  for knee pain, which was very helpful, but she has run out of it. She has tried Voltaren  gel and lidocaine  for pain relief, but neither was  effective.     She continues alendronate  for osteoporosis.  Takes vit D. Last level 4/24 was 43.    Placed booking for R TKR.  Discussed robotic assisted and ordered CT.  She will need to see her PCP prior to surgery.  Not sure what the popping in her knee is.  Strange it happens at rest. She may be having a cramp.    Next BMD would be after 10/2024.    Labs fine.  Prescribe mobic .    Jerilee Montane, MD, 02/10/2024

## 2024-02-22 ENCOUNTER — Encounter (HOSPITAL_BASED_OUTPATIENT_CLINIC_OR_DEPARTMENT_OTHER): Payer: Self-pay | Admitting: Family Medicine

## 2024-02-23 MED ORDER — DULOXETINE HCL 60 MG PO CPEP
60.0000 mg | ORAL_CAPSULE | Freq: Every day | ORAL | 3 refills | Status: AC
Start: 2024-02-23 — End: 2025-02-22

## 2024-02-23 NOTE — Telephone Encounter (Signed)
 PER Patient (self), Stephanie [name] is a 63 year old female has requested a refill of DULoxetine  (CYMBALTA ) 60 MG capsule .      Last OFFICE/TELE Visit:  12/26/2023 with [facility]      Last Physical Exam:   02/21/2013     There are no preventive care reminders to display for this patient.    Other Med Adult:  Most Recent BP Reading(s)  03/28/23 : 115/75        Cholesterol (mg/dL)   Date Value   88/86/7975 220     LOW DENSITY LIPOPROTEIN DIRECT (mg/dL)   Date Value   88/86/7975 141     HIGH DENSITY LIPOPROTEIN (mg/dL)   Date Value   88/86/7975 67     TRIGLYCERIDES (mg/dL)   Date Value   95/96/7976 254 (H)         THYROID  SCREEN TSH REFLEX FT4 (uIU/mL)   Date Value   05/16/2023 0.778         TSH (THYROID  STIM HORMONE) (uIU/mL)   Date Value   12/10/2016 0.790       HEMOGLOBIN A1C (%)   Date Value   07/13/2023 6.4 (H)       No results found for: POCA1C      INR (no units)   Date Value   08/03/2021 1.1   04/22/2008 1.0 (L)   02/07/2005 1.0 (L)       SODIUM (mmol/L)   Date Value   10/04/2022 140       POTASSIUM (mmol/L)   Date Value   10/04/2022 4.4           CREATININE (mg/dL)   Date Value   97/94/7975 0.6       Documented patient preferred pharmacies:    [facility], [place] - [address]  Phone: [phone] Fax: [phone]

## 2024-03-29 ENCOUNTER — Other Ambulatory Visit: Payer: Self-pay

## 2024-04-02 ENCOUNTER — Encounter (HOSPITAL_BASED_OUTPATIENT_CLINIC_OR_DEPARTMENT_OTHER): Payer: Self-pay | Admitting: Orthopaedic Surgery

## 2024-04-02 ENCOUNTER — Encounter (HOSPITAL_BASED_OUTPATIENT_CLINIC_OR_DEPARTMENT_OTHER): Payer: Self-pay | Admitting: Family Medicine

## 2024-04-02 ENCOUNTER — Encounter (HOSPITAL_BASED_OUTPATIENT_CLINIC_OR_DEPARTMENT_OTHER): Payer: Self-pay | Admitting: Physician Assistant

## 2024-04-02 DIAGNOSIS — Z6833 Body mass index (BMI) 33.0-33.9, adult: Secondary | ICD-10-CM

## 2024-04-02 DIAGNOSIS — G8929 Other chronic pain: Secondary | ICD-10-CM

## 2024-04-02 DIAGNOSIS — F5104 Psychophysiologic insomnia: Secondary | ICD-10-CM

## 2024-04-02 MED ORDER — TRAZODONE HCL 50 MG PO TABS
50.0000 mg | ORAL_TABLET | Freq: Every evening | ORAL | 2 refills | Status: DC
Start: 2024-04-02 — End: 2024-06-20

## 2024-04-02 MED ORDER — MELOXICAM 15 MG PO TABS
15.0000 mg | ORAL_TABLET | Freq: Every day | ORAL | 1 refills | Status: DC
Start: 2024-04-02 — End: 2024-06-20

## 2024-04-02 NOTE — Telephone Encounter (Signed)
 PER who is calling: Patient (self), Stephanie [name] is a 63 year old female has requested a refill of       trazodoeon.        Last Office Visit  : 03/28/2023 Marvene Spear, MD      Last Tele Visit:  12/26/2023      Last Physical Exam:  02/21/2013    There are no preventive care reminders to display for this patient.    Other Med Adult:  Most Recent BP Reading(s)  03/28/23 : 115/75        Cholesterol (mg/dL)   Date Value   88/86/7975 220     LOW DENSITY LIPOPROTEIN DIRECT (mg/dL)   Date Value   88/86/7975 141     HIGH DENSITY LIPOPROTEIN (mg/dL)   Date Value   88/86/7975 67     TRIGLYCERIDES (mg/dL)   Date Value   95/96/7976 254 (H)         THYROID  SCREEN TSH REFLEX FT4 (uIU/mL)   Date Value   05/16/2023 0.778         TSH (THYROID  STIM HORMONE) (uIU/mL)   Date Value   12/10/2016 0.790       HEMOGLOBIN A1C (%)   Date Value   07/13/2023 6.4 (H)       No results found for: POCA1C      INR (no units)   Date Value   08/03/2021 1.1   04/22/2008 1.0 (L)   02/07/2005 1.0 (L)       SODIUM (mmol/L)   Date Value   10/04/2022 140       POTASSIUM (mmol/L)   Date Value   10/04/2022 4.4           CREATININE (mg/dL)   Date Value   97/94/7975 0.6       Documented patient preferred pharmacies:    [facility], [place] - [address]  Phone: [phone] Fax: [phone]

## 2024-04-02 NOTE — Telephone Encounter (Signed)
 PER who is calling: Pharmacy, Stephanie [name] is a 63 year old female has requested a refill of       meloxicam .        Last Office Visit  : 02/10/2024 maple [name], MD      Last Tele Visit:  12/26/2023      Last Physical Exam:  02/21/2013    There are no preventive care reminders to display for this patient.    Other Med Adult:  Most Recent BP Reading(s)  03/28/23 : 115/75        Cholesterol (mg/dL)   Date Value   88/86/7975 220     LOW DENSITY LIPOPROTEIN DIRECT (mg/dL)   Date Value   88/86/7975 141     HIGH DENSITY LIPOPROTEIN (mg/dL)   Date Value   88/86/7975 67     TRIGLYCERIDES (mg/dL)   Date Value   95/96/7976 254 (H)         THYROID  SCREEN TSH REFLEX FT4 (uIU/mL)   Date Value   05/16/2023 0.778         TSH (THYROID  STIM HORMONE) (uIU/mL)   Date Value   12/10/2016 0.790       HEMOGLOBIN A1C (%)   Date Value   07/13/2023 6.4 (H)       No results found for: POCA1C      INR (no units)   Date Value   08/03/2021 1.1   04/22/2008 1.0 (L)   02/07/2005 1.0 (L)       SODIUM (mmol/L)   Date Value   10/04/2022 140       POTASSIUM (mmol/L)   Date Value   10/04/2022 4.4           CREATININE (mg/dL)   Date Value   97/94/7975 0.6       Documented patient preferred pharmacies:    [facility], [place] - [address]  Phone: [phone] Fax: [phone]

## 2024-04-16 ENCOUNTER — Ambulatory Visit (HOSPITAL_BASED_OUTPATIENT_CLINIC_OR_DEPARTMENT_OTHER): Admitting: Family Medicine

## 2024-04-16 ENCOUNTER — Ambulatory Visit
Admission: RE | Admit: 2024-04-16 | Discharge: 2024-04-16 | Disposition: A | Discharge status: Home / Self Care | Attending: Family Medicine | Admitting: Family Medicine

## 2024-04-16 ENCOUNTER — Other Ambulatory Visit: Payer: Self-pay

## 2024-04-16 ENCOUNTER — Encounter (HOSPITAL_BASED_OUTPATIENT_CLINIC_OR_DEPARTMENT_OTHER): Payer: Self-pay | Admitting: Family Medicine

## 2024-04-16 VITALS — BP 125/73 | HR 93 | Temp 97.6°F | Ht 58.66 in | Wt 154.4 lb

## 2024-04-16 DIAGNOSIS — R7303 Prediabetes: Secondary | ICD-10-CM | POA: Diagnosis present

## 2024-04-16 DIAGNOSIS — E669 Obesity, unspecified: Secondary | ICD-10-CM

## 2024-04-16 DIAGNOSIS — N951 Menopausal and female climacteric states: Secondary | ICD-10-CM | POA: Insufficient documentation

## 2024-04-16 DIAGNOSIS — I10 Essential (primary) hypertension: Secondary | ICD-10-CM | POA: Insufficient documentation

## 2024-04-16 DIAGNOSIS — L609 Nail disorder, unspecified: Secondary | ICD-10-CM | POA: Insufficient documentation

## 2024-04-16 DIAGNOSIS — M25562 Pain in left knee: Secondary | ICD-10-CM | POA: Insufficient documentation

## 2024-04-16 DIAGNOSIS — R234 Changes in skin texture: Secondary | ICD-10-CM

## 2024-04-16 DIAGNOSIS — L508 Other urticaria: Secondary | ICD-10-CM

## 2024-04-16 DIAGNOSIS — G8929 Other chronic pain: Secondary | ICD-10-CM | POA: Diagnosis present

## 2024-04-16 LAB — LUTEINIZING HORMONE (LH): LUTEINIZING HORMONE (LH): 31.3 m[IU]/mL

## 2024-04-16 LAB — BASIC METABOLIC PANEL
ANION GAP: 13 mmol/L (ref 10–22)
BUN (UREA NITROGEN): 13 mg/dL (ref 7–18)
CALCIUM: 9.1 mg/dL (ref 8.5–10.5)
CARBON DIOXIDE: 22 mmol/L (ref 21–32)
CHLORIDE: 107 mmol/L (ref 98–107)
CREATININE: 0.7 mg/dL (ref 0.4–1.2)
ESTIMATED GLOMERULAR FILT RATE: >60 mL/min (ref >60)
Glucose Random: 103 mg/dL (ref 74–160)
POTASSIUM: 3.9 mmol/L (ref 3.5–5.1)
SODIUM: 142 mmol/L (ref 136–145)

## 2024-04-16 LAB — FOLATE: FOLATE: 6.1 ng/mL (ref 4.6–?)

## 2024-04-16 LAB — VITAMIN D,25 HYDROXY: VITAMIN D,25 HYDROXY: 52 ng/mL (ref 30.0–100.0)

## 2024-04-16 LAB — IRON: IRON: 122 ug/dL (ref 50–170)

## 2024-04-16 LAB — FOLLICLE STIMULATING HORMONE: FOLLICLE STIMULATING HORMONE: 54.8 m[IU]/mL

## 2024-04-16 LAB — VITAMIN B12: VITAMIN B12: 600 pg/mL (ref 232–1245)

## 2024-04-16 LAB — THYROID SCREEN TSH REFLEX FT4: THYROID SCREEN TSH REFLEX FT4: 2.59 u[IU]/mL (ref 0.270–4.200)

## 2024-04-16 MED ORDER — AMMONIUM LACTATE 12 % EX LOTN: TOPICAL_LOTION | CUTANEOUS | 0 refills | Status: DC | PRN

## 2024-04-16 MED ORDER — ESTRADIOL 1.53 MG/SPRAY TD SOLN
1.0000 | Freq: Every day | TRANSDERMAL | 0 refills | Status: DC
Start: 2024-04-16 — End: 2024-06-20

## 2024-04-16 MED ORDER — CICLOPIROX OLAMINE 0.77 % EX SUSP
1.0000 [drp] | Freq: Two times a day (BID) | CUTANEOUS | 0 refills | Status: AC
Start: 2024-04-16 — End: 2025-04-16

## 2024-04-16 MED ORDER — OMALIZUMAB 300 MG/2ML SC SOAJ
300.0000 mg | SUBCUTANEOUS | 11 refills | Status: AC
Start: 2024-04-16 — End: 2025-04-16

## 2024-04-16 NOTE — Progress Notes (Unsigned)
 BP 125/73   Pulse 93   Temp 97.6 F (36.4 C)   Ht 4' 10.66 (1.49 m)   Wt 70 kg (154 lb 6.4 oz)   LMP 07/11/2005   SpO2 99%   BMI 31.55 kg/m     S:    Subjective   Stephanie [name] is a 63 year old female who presents for No chief complaint on file.  History of Present Illness    The patient presents with recurrent urticaria and concerns about hormone replacement therapy.    She has a history of recurrent urticaria, previously managed with omalizumab  injections, which provided relief for about two months. The symptoms, characterized by itching and hives all over her body, have returned. She has visited the emergency room twice for this issue. She self-administered the injection after observing her niece, a Engineer, civil (consulting), administer it.    She underwent a hysterectomy and oophorectomy in 2012 and is now experiencing menopausal symptoms, including hot flashes and temperature fluctuations, which were not present immediately after her surgery. Her daughter, who has a background in pharmacy and aesthetics, suggested checking hormone levels.    She reports thickened skin on her feet, which she attributes to a new tool used by her manicurist. The skin is not itchy but is very thick. She also mentions a loose toenail, which she suspects might be due to trauma rather than a fungal infection.    She experiences pain in her legs, described as muscle cramps. She also reports pain in her side, which she initially thought was related to her kidneys, but it has improved over time.    She is scheduled for knee surgery in December but has concerns about the timing due to personal commitments during the holiday season.    Her current medications include duloxetine , losartan , atorvastatin , and omeprazole . She takes meloxicam  as needed for pain and has been using famotidine  for stomach acidity, although she is trying to reduce its use. She also takes omega-3 and vitamin D3 supplements.           Review of Systems    ROS: Per HPI.  No  reported headaches, weakness/falls, chest pain, shortness of breath, abdominal pain/nausea/vomiting, numbness/tingling, fevers or chills.     Past Medical History:  02/12/2016: Chronic right shoulder pain  No date: Depression  03/20/2012: Diverticulosis of colon      Comment:  Noted on colonoscopy 6/13   08/16/2010: Elevated hemoglobin A1c      Comment:  HEMOGLOBIN A1C (%) Date Value 03/06/2015 5.6                05/20/2014 5.4 05/08/2012 6.0 (H)   No results found for:               POCA1C   01/04/2023: Eyelid lesion      Comment:  Right lower eyelid lesion  11/21/2012: Hyperopia with astigmatism and presbyopia  No date: Hypertension  11/21/2012: Immature cataract  01/21/2012: Impingement syndrome of right shoulder      Comment:  10/14: pt taking  [place] [facility] (meloxicam  7.5 mg BID                and cyclobenzaprine ); Will ask for refill once supply                finished; PCP ok with refill of flexeril  on temporary                basis;   01/26/2008: Irritable bladder  Comment:  Seen by Dr Maylon 5/09, no incontinence, likely                irritable bladder.  Trial of anticholinergic, refer to                urology, has appt Dr Rilla 02/23/08  02/16/2005: Leiomyoma of uterus, unspecified      Comment:  Dr Maylon hysterectomy November, 2006; cervix retained,               last pap 11/11 neg Repeat pap/HPV 07/2015   02/15/2014: Mass of hand  No date: OSA (obstructive sleep apnea)  07/30/2011: Other plastic surgery for unacceptable cosmetic appearance      Comment:  Breast augmentation scheduled 08/02/11;  Dr.  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  Medications, allergies, PMH and SH reviewed and updated as necessary in Epic.       Objective   Blood pressure 125/73, pulse 93, temperature 97.6 F (36.4 C), height 4' 10.66 (1.49 m), weight 70 kg (154 lb 6.4 oz), last menstrual period 07/11/2005, SpO2 99%, not currently breastfeeding.  Physical Exam    EXTREMITIES: Foot with thickened skin and injuries, non-tender. Nail appears detached, no signs of fungal infection.  MUSCULOSKELETAL: Ribs tender on palpation.       GEN: NAD  HEENT: MMM  SKIN: WWP, finger nail of middle finger loose from nailbed   CHEST: Breathing comfortably  NEURO: No focal deficits noted, alert  MSK: Normal gait observed  PSYCH: Appears stated age, appropriate grooming and dress, appropriate affect and mood congruent    Results                       Chronic urticaria  Recurrent urticaria with pruritic hives. Previous omalizumab  treatment effective for two months. Symptoms returned post-discontinuation. Self-administered injection correctly.  - Administer omalizumab  injection every four weeks.  - Coordinate with pharmacy for omalizumab  delivery.  - Schedule follow-up with allergist to monitor treatment response and discuss any necessary blood tests.    Right knee osteoarthritis, planned for surgery  Scheduled for right knee surgery on December 1st. She wishes to delay surgery due to holiday commitments.    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 No uterus or ovaries present, allowing estrogen therapy without progesterone. Discussed estrogen spray use and need to inform surgical team due to thromboembolism risk.  - Prescribe estrogen spray, one spray per day on the inner arm.  - Advise to inform surgical team about estrogen use prior to knee surgery due to increased risk of thromboembolism.  - Discuss potential need to pause estrogen therapy around the time of surgery.    Bilateral leg cramps  Bilateral leg cramps  reported.  - Recommend magnesium  supplementation to alleviate leg cramps.    Chronic pain in left mid back wall (musculoskeletal)  Intermittent musculoskeletal pain in the left lower chest wall, possibly related to costal or intercostal muscle strain.    Onycholysis, likely traumatic  Onycholysis of the toenail likely  due to trauma. Previous similar occurrence resolved spontaneously.  - Prescribe ciclopirox  topical solution for potential antifungal treatment.    Plantar hyperkeratosis  Thickened skin on the feet, possibly due to mechanical irritation.  - Apply keratolytic cream to reduce hyperkeratosis.    Hypertension  Hypertension managed with losartan .    Hyperlipidemia  Lipid levels well-managed with atorvastatin .    Gastroesophageal reflux disease (GERD)  Managed with famotidine  and omeprazole . Advised against daily famotidine  unless symptomatic.    Depression  Managed with duloxetine .         Diagnoses and all orders for this visit:  Hot flashes due to menopause  -     FOLLICLE STIMULATING HORMONE; Future  -     LUTEINIZING HORMONE (LH); Future  -     THYROID  SCREEN TSH REFLEX FT4; Future  -     VITAMIN D ,25 HYDROXY; Future  -     FOLATE; Future  -     VITAMIN B12; Future  -     IRON; Future  Skin thickening  Chronic pain of left knee  Chronic urticaria  -     Omalizumab  300 MG/2ML SOAJ; Inject 300 mg under the skin every 30 (thirty) days  -     REFERRAL TO DERMATOLOGY (EXT)  Obesity (BMI 30-39.9)  Prediabetes  Nail abnormality  -     ciclopirox  (LOPROX ) 0.77 % SUSP; Apply 1 drop topically in the morning and 1 drop before bedtime.  Hypertension, goal below 140/90  -     BASIC METABOLIC PANEL; Future  Other orders  -     ammonium lactate  (LAC-HYDRIN ) 12 % lotion; Apply topically as needed for Dry skin  -     estradiol  (EVAMIST ) 1.53 MG/SPRAY transdermal spray; Place 1 spray onto the skin daily      Attestation              The patient verbally consented to an audio recording of their visit to assist with the  completion of documentation. The patient is aware the recording is not retained after the visit is summarized.     Vena Noel, MD

## 2024-04-17 ENCOUNTER — Encounter (HOSPITAL_BASED_OUTPATIENT_CLINIC_OR_DEPARTMENT_OTHER): Payer: Self-pay | Admitting: Family Medicine

## 2024-04-18 NOTE — Telephone Encounter (Signed)
 PA approved.

## 2024-04-18 NOTE — Prior Authorization (Signed)
 [facility]  PA approved    Pt is okay to be scheduled

## 2024-05-02 ENCOUNTER — Ambulatory Visit (HOSPITAL_BASED_OUTPATIENT_CLINIC_OR_DEPARTMENT_OTHER): Payer: Self-pay | Admitting: Registered Nurse

## 2024-05-02 NOTE — Telephone Encounter (Signed)
 Pt reports condition of dry skin reddened at times now cracks making it difficult to ambulate  No fever  No drainage or areas of warmth  No n/v/d  No recent illness or traumas  Advised per nursing triage protocol.      Recommended disposition for patient:Disposition: See in Office within 3 days    If patient referred to UC/ED advised that they may require further follow up and testing after the visit with their primary care office.     Instructed patient to call back for any new, worsening, or worrisome symptoms or concerns any time day or night.

## 2024-05-04 ENCOUNTER — Other Ambulatory Visit: Payer: Self-pay

## 2024-05-04 ENCOUNTER — Emergency Department
Admission: EM | Admit: 2024-05-04 | Discharge: 2024-05-04 | Disposition: A | Discharge status: Home / Self Care | Attending: Emergency Medicine | Admitting: Emergency Medicine

## 2024-05-04 DIAGNOSIS — M791 Myalgia, unspecified site: Secondary | ICD-10-CM | POA: Diagnosis not present

## 2024-05-04 DIAGNOSIS — L509 Urticaria, unspecified: Secondary | ICD-10-CM | POA: Insufficient documentation

## 2024-05-04 DIAGNOSIS — R21 Rash and other nonspecific skin eruption: Secondary | ICD-10-CM | POA: Diagnosis present

## 2024-05-04 LAB — CBC, PLATELET & DIFFERENTIAL
ABSOLUTE BASO COUNT: 0 TH/uL (ref 0.0–0.1)
ABSOLUTE EOSINOPHIL COUNT: 0.1 TH/uL (ref 0.0–0.8)
ABSOLUTE IMM GRAN COUNT: 0.03 TH/uL (ref 0.00–0.10)
ABSOLUTE LYMPH COUNT: 3.3 TH/uL (ref 0.6–5.9)
ABSOLUTE MONO COUNT: 0.8 TH/uL (ref 0.2–1.4)
ABSOLUTE NEUTROPHIL COUNT: 5.6 TH/uL (ref 1.6–8.3)
ABSOLUTE NRBC COUNT: 0 TH/uL (ref 0.0–0.0)
BASOPHIL %: 0.2 % (ref 0.0–1.2)
EOSINOPHIL %: 1.3 % (ref 0.0–7.0)
HEMATOCRIT: 35.1 % (ref 34.1–44.9)
HEMOGLOBIN: 11.8 g/dL (ref 11.2–15.7)
IMMATURE GRANULOCYTE %: 0.3 % (ref 0.0–1.0)
LYMPHOCYTE %: 33.5 % (ref 15.0–54.0)
MEAN CORP HGB CONC: 33.6 g/dL (ref 31.0–37.0)
MEAN CORPUSCULAR HGB: 30.8 pg (ref 26.0–34.0)
MEAN CORPUSCULAR VOL: 91.6 fl (ref 80.0–100.0)
MEAN PLATELET VOLUME: 10.4 fL (ref 8.7–12.5)
MONOCYTE %: 8 % (ref 4.0–13.0)
NEUTROPHIL %: 56.7 % (ref 40.0–75.0)
NRBC %: 0 % (ref 0.0–0.0)
PLATELET COUNT: 243 TH/uL (ref 150–400)
RBC DISTRIBUTION WIDTH STD DEV: 47.1 fL — ABNORMAL HIGH (ref 35.1–46.3)
RED BLOOD CELL COUNT: 3.83 M/uL — ABNORMAL LOW (ref 3.90–5.20)
WHITE BLOOD CELL COUNT: 9.9 TH/uL (ref 4.0–11.0)

## 2024-05-04 LAB — BASIC METABOLIC PANEL
ANION GAP: 13 mmol/L (ref 10–22)
BUN (UREA NITROGEN): 7 mg/dL (ref 7–18)
CALCIUM: 9.2 mg/dL (ref 8.5–10.5)
CARBON DIOXIDE: 22 mmol/L (ref 21–32)
CHLORIDE: 102 mmol/L (ref 98–107)
CREATININE: 0.6 mg/dL (ref 0.4–1.2)
ESTIMATED GLOMERULAR FILT RATE: >60 mL/min (ref >60)
Glucose Random: 105 mg/dL (ref 74–160)
POTASSIUM: 4.4 mmol/L (ref 3.5–5.1)
SODIUM: 137 mmol/L (ref 136–145)

## 2024-05-04 LAB — HEPATIC FUNCTION PANEL
ALANINE AMINOTRANSFERASE: 39 U/L (ref 12–45)
ALBUMIN: 4.3 g/dL (ref 3.4–5.2)
ALKALINE PHOSPHATASE: 100 U/L (ref 45–117)
ASPARTATE AMINOTRANSFERASE: 36 U/L — ABNORMAL HIGH (ref 8–34)
BILIRUBIN DIRECT: 0.2 mg/dL (ref 0.0–0.2)
BILIRUBIN TOTAL: 0.5 mg/dL (ref 0.2–1.0)
INDIRECT BILIRUBIN: 0.3 mg/dL (ref 0.2–0.9)
TOTAL PROTEIN: 7 g/dL (ref 6.4–8.2)

## 2024-05-04 LAB — HOLD BLUE TOP TUBE

## 2024-05-04 LAB — HOLD GREEN TOP TUBE

## 2024-05-04 LAB — MAGNESIUM: MAGNESIUM: 2.1 mg/dL (ref 1.6–2.6)

## 2024-05-04 LAB — CREATINE KINASE TOTAL: CREATINE KINASE TOTAL: 69 U/L (ref 26–192)

## 2024-05-04 MED ORDER — CETIRIZINE HCL 10 MG PO TABS
10.0000 mg | ORAL_TABLET | Freq: Every day | ORAL | 0 refills | Status: AC
Start: 2024-05-04 — End: 2024-06-03

## 2024-05-04 MED ORDER — CETIRIZINE HCL 10 MG PO TABS
10.0000 mg | ORAL_TABLET | Freq: Once | ORAL | Status: AC
Start: 2024-05-04 — End: 2024-05-04
  Administered 2024-05-04: 10 mg via ORAL
  Filled 2024-05-04: qty 1

## 2024-05-04 MED ORDER — ACETAMINOPHEN 500 MG PO TABS
1000.0000 mg | ORAL_TABLET | Freq: Once | ORAL | Status: AC
Start: 2024-05-04 — End: 2024-05-04
  Administered 2024-05-04: 1000 mg via ORAL
  Filled 2024-05-04: qty 2

## 2024-05-04 MED ORDER — IBUPROFEN 600 MG PO TABS
600.0000 mg | ORAL_TABLET | Freq: Four times a day (QID) | ORAL | 0 refills | Status: AC | PRN
Start: 2024-05-04 — End: 2024-05-09

## 2024-05-04 MED ORDER — ACETAMINOPHEN 500 MG PO TABS
500.0000 mg | ORAL_TABLET | Freq: Four times a day (QID) | ORAL | 0 refills | Status: AC | PRN
Start: 2024-05-04 — End: 2024-05-09

## 2024-05-04 MED ORDER — KETOROLAC TROMETHAMINE 15 MG/ML IJ SOLN
15.0000 mg | Freq: Once | INTRAMUSCULAR | Status: AC
Start: 2024-05-04 — End: 2024-05-04
  Administered 2024-05-04: 15 mg via INTRAVENOUS
  Filled 2024-05-04: qty 1

## 2024-05-04 NOTE — ED Triage Note (Signed)
 Patient arrives ambulatory to ED c/o lower back pain radiating to B/L LE and radiating to upper back/neck, difficult/painful ambulation, patient reports not being able to ambulate yesterday d/t pain, no OTC PTA, denies urinary/bowel s/sx

## 2024-05-04 NOTE — Discharge Instructions (Addendum)
 Lab work reassuring here in the emergency room.  As discussed, especially here in the emergency room, we do not always find out cause of ongoing symptoms.    For ongoing more muscle pain/body aches, recommend that you stay hydrated, continue magnesium  supplement as recommended by your PCP.  Alternate Tylenol  and ibuprofen .    Ibuprofen /Motrin /Advil : 600 mg every 6 hours, take with food  Tylenol /acetaminophen : 1000 mg every 4 hours, no more than 3 doses per day    For ongoing rash, unclear cause.  Recommend that he take Zyrtec  daily.  Try to avoid things that make itching/rash worse (heat exposure such as hot showers, and scratching).  Recommend that you continue to follow-up with your primary care, dermatology, may also benefit from a rheumatology evaluation.    If at anytime with a rash you develop associated tongue or lip swelling, difficulty breathing or vomiting, or if you develop localized pain, please return for reevaluation

## 2024-05-04 NOTE — ED Provider Notes (Signed)
 EMERGENCY DEPARTMENT PHYSICIAN ASSISTANT NOTE      The ED nursing record was reviewed.   This patient was seen with Emergency Department attending physician Dr. Genora Mae interpreter used for examination  History obtained from patient  Arrived to the ED by Self    CHIEF COMPLAINT    Patient presents with:  Back Pain      HPI    Stephanie [name] is a 63 year old female, denies chronic medical history, who comes to the emergency department complaining of bodyaches.  Patient states that 2 weeks ago she started having pain in her legs that has now extended to her entire body.  Patient states that she feels like there is a shocking pain in all of her nerves, but also that her muscles hurt, and that her hands and toes feel swollen.  Patient denies any fevers or chills, no URI symptoms.    Patient states for last 6 months she has been having ongoing intermittent rash (pictures on phone- diffuse urticaria).  Patient states that the rash usually occurs at night, and then resolves a few hours after waking up.  Patient states that the rash has been making it difficult to sleep as it is very itchy.  Patient tried allergy  medicine with no relief.  Patient denies any known new exposure, no new lotions creams laundry detergents etc.    REVIEW OF SYSTEMS    General: no fever  Respiratory: no dyspnea  Cardiac: no chest pain  GI: no nausea, vomiting  GU: no urinary symptoms  MSK/skin: body aches, nerve pain  Neuro: no headache     PAST MEDICAL HISTORY    Past Medical History:  02/12/2016: Chronic right shoulder pain  No date: Depression  03/20/2012: Diverticulosis of colon      Comment:  Noted on colonoscopy 6/13   08/16/2010: Elevated hemoglobin A1c      Comment:  HEMOGLOBIN A1C (%) Date Value 03/06/2015 5.6                05/20/2014 5.4 05/08/2012 6.0 (H)   No results found for:               POCA1C   01/04/2023: Eyelid lesion      Comment:  Right lower eyelid lesion  11/21/2012: Hyperopia with astigmatism and  presbyopia  No date: Hypertension  11/21/2012: Immature cataract  01/21/2012: Impingement syndrome of right shoulder      Comment:  10/14: pt taking  [place] med (meloxicam  7.5 mg BID                and cyclobenzaprine ); Will ask for refill once supply                finished; PCP ok with refill of flexeril  on temporary                basis;   01/26/2008: Irritable bladder      Comment:  Seen by Dr Maylon 5/09, no incontinence, likely                irritable bladder.  Trial of anticholinergic, refer to                urology, has appt Dr Rilla 02/23/08  02/16/2005: Leiomyoma of uterus, unspecified      Comment:  Dr Maylon hysterectomy November, 2006; cervix retained,               last pap 11/11 neg Repeat pap/HPV  07/2015   02/15/2014: Mass of hand  No date: OSA (obstructive sleep apnea)  07/30/2011: Other plastic surgery for unacceptable cosmetic appearance      Comment:  Breast augmentation scheduled 08/02/11;  Dr.  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, left  01/08/2016: Schistosomiasis  08/13/2016: Squamous 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   eyes  06/18/2004: Urinary calculus, unspecified    PROBLEM LIST  Patient Active Problem List:     BMI 33.0-33.9,adult     Pure hypercholesterolemia     Family history of malignant neoplasm of gastrointestinal tract     Tubular adenoma of colon     Hyperopia with astigmatism and presbyopia     Immature cataract     Hypertension, goal below 140/90     Adenomatous polyp of colon     Squamous 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 eyes     Depression, recurrent (HCC)     Trauma and stressor-related disorder     Nontraumatic complete tear of right rotator cuff     Localized osteoarthritis of left knee     Obesity (BMI 30-39.9)     Chronic pain of left knee     Closed fracture of  left distal radius     Fatty liver     Prediabetes     Surgical menopause     Generalized anxiety disorder     Eyelid lesion     Primary osteoarthritis of right knee      SURGICAL HISTORY    Past Surgical History:  No date: BREAST ENHANCEMENT SURGERY      Comment:  12/12 had bilateral mammoplasty and saline implants, had               2nd surgery 5/30 for revision  01/04/2023: EXC LESION EYELID W/O CLSR/W/SIMPLE DIR CLOSURE; Right      Comment:  s/p excision of eyelid lesion, right lower eyelid,                laterally, by VJP II  No date: MAMMAPLASTY AUGMENTATION W/PROSTHETIC IMPLANT  No date: MASTOPEXY  No date: OB ANTEPARTUM CARE CESAREAN DLVR & POSTPARTUM      Comment:  x3  No date: REDUCTION MAMMAPLASTY  No date: TOTAL ABDOMINAL HYSTERECT W/WO RMVL TUBE OVARY      Comment:  for fibroids, still has cervix  No date: TOTAL KNEE REPLACEMENT; Left      Comment:  2/23    CURRENT MEDICATIONS    No current facility-administered medications for this encounter.    Current Outpatient Medications:     ibuprofen  (ADVIL ) 600 MG tablet, Take 1 tablet by mouth every 6 (six) hours as needed for Pain  for up to 5 days, Disp: 15 tablet, Rfl: 0    acetaminophen  (TYLENOL ) 500 MG tablet, Take 1 tablet by mouth every 6 (six) hours as needed for Pain  for up to 5 days, Disp: 15 tablet, Rfl: 0    cetirizine  (ZYRTEC ) 10 MG tablet, Take 1 tablet by mouth daily, Disp: 30 tablet, Rfl: 0    ammonium lactate  (LAC-HYDRIN ) 12 % lotion, Apply topically as needed for Dry skin, Disp: 400 g, Rfl: 0    estradiol  (EVAMIST ) 1.53 MG/SPRAY transdermal spray, Place 1  spray onto the skin daily, Disp: 8.1 mL, Rfl: 0    Omalizumab  300 MG/2ML SOAJ, Inject 300 mg under the skin every 30 (thirty) days, Disp: 2 mL, Rfl: 11    ciclopirox  (LOPROX ) 0.77 % SUSP, Apply 1 drop topically in the morning and 1 drop before bedtime., Disp: 1 each, Rfl: 0    meloxicam  (MOBIC ) 15 MG tablet, Take 1 tablet by mouth daily Take with food, Disp: 30 tablet, Rfl: 1    traZODone   (DESYREL ) 50 MG tablet, Take 1 tablet by mouth nightly, Disp: 90 tablet, Rfl: 2    DULoxetine  (CYMBALTA ) 60 MG capsule, Take 1 capsule by mouth daily Stop taking duloxetine  30mg , Disp: 90 capsule, Rfl: 3    carboxymethylcellulose (REFRESH TEARS) 0.5 % SOLN, Place 1 drop into both eyes in the morning and 1 drop at noon and 1 drop in the evening and 1 drop before bedtime. (Patient not taking: Reported on 02/10/2024), Disp: 15 mL, Rfl: 12    atorvastatin  (LIPITOR) 40 MG tablet, Take 1 tablet by mouth daily, Disp: 90 tablet, Rfl: 3    omeprazole  (PRILOSEC) 40 MG capsule, Take 1 capsule by mouth daily, Disp: 90 capsule, Rfl: 3    alendronate  (FOSAMAX ) 70 MG tablet, Take 70mg  every 7 days with 8 oz of water  on empty stomach.  Do not take anything else by mouth and stay upright (do not lie down) for 30 min until 1st food of day., Disp: 13 tablet, Rfl: 4    losartan  (COZAAR ) 25 MG tablet, Take 1 tablet by mouth daily, Disp: 90 tablet, Rfl: 3    Multiple Vitamin (MULTIVITAMIN) TABS, Take 1 tablet by mouth in the morning., Disp: , Rfl:     Glucosamine 750 MG TABS, Take by mouth, Disp: , Rfl:     Omega-3 Fatty Acids (FISH OIL) 1000 MG CAPS, Take by mouth, Disp: , Rfl:     Turmeric 500 MG CAPS, Take by mouth, Disp: , Rfl:     ALLERGIES    Review of Patient's Allergies indicates:  No Known Allergies    FAMILY HISTORY    Review of patient's family history indicates:  Problem: Cancer - Other      Relation: Father          Age of Onset: (Not Specified)          Comment: stomach  Problem: Heart      Relation: Brother          Age of Onset: (Not Specified)          Comment: died at 4 during valve replacement  Problem: Heart      Relation: Brother          Age of Onset: (Not Specified)          Comment: chestpain with neg w/u  Problem: Heart      Relation: Mother          Age of Onset: (Not Specified)          Comment: Died of MI age 46  Problem: Diabetes      Relation: Mother          Age of Onset: (Not Specified)      SOCIAL HISTORY     Social History     Socioeconomic History    Marital status: Divorced     Spouse name: Not on file    Number of children: Not on file    Years of education: Not on file  Highest education level: Not on file   Occupational History    Occupation: cleaning     Comment: In [place]  2000; 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   Tobacco Use    Smoking status: Former     Current packs/day: 0.00     Types: Cigarettes     Quit date: 07/27/2001     Years since quitting: 22.7     Passive exposure: Past    Smokeless tobacco: Never   Substance and Sexual Activity    Alcohol use: Yes     Alcohol/week: 6.0 standard drinks of alcohol     Types: 6 Cans of beer per week     Comment: 6 beers per week     Drug use: No    Sexual activity: Not Currently     Partners: Male     Comment: G3P3, no hx STD, no hx abn Pap   Other Topics Concern    Not on file   Social History Narrative    08/03/19 Divorced, has 3 children, lives alone, works as a Education officer, environmental lady    Social Drivers of Catering manager Strain: Not on Ship broker Insecurity: Not on file  Transportation Needs: Not on file  Physical Activity: Not on file  Stress: Not on file  Social Connections: Not on file  Intimate Partner Violence: Not on file  Housing Stability: Not on file      PHYSICAL EXAM      Vital Signs: BP 106/74   Pulse 85   Temp 98.2 F   Resp 18   Wt 69.9 kg (154 lb)   LMP 07/11/2005   SpO2 95%   BMI 31.47 kg/m      Constitutional: Well-developed, Well-nourished, Non-toxic appearance. Speaking full sentences.  Head: Normocephalic   Eyes: Vision grossly intact.  ENT: Mucous membranes moist.  Hearing grossly intact.    Neck: Normal range of motion, Supple with no meningismus   Cardio.: RRR, No MRG's.   Pulmonary: Normal BS's bilaterally, non-labored, No tachypnea, wheezes, rales or rhonchi  Abd.  Soft, NTND  GU:  No CVA tenderness.  Back: No midline tenderness; diffuse tenderness to light touch  Musculoskeletal: Moving all 4 extremities, No major deformities noted. No localized  edema  Neurological: CNII-XII grossly intact, No focal deficits noted.   Psychiatric: Appropriate for age and situation.  Skin: Warm, dry, no rash noted at this time      RESULTS  Results for orders placed or performed during the hospital encounter of 05/04/24 (from the past 24 hours)   CBC, Platelet & Differential    Collection Time: 05/04/24  9:11 AM   Result Value    WHITE BLOOD CELL COUNT 9.9    RED BLOOD CELL COUNT 3.83 (L)    HEMOGLOBIN 11.8    HEMATOCRIT 35.1    MEAN CORPUSCULAR VOL 91.6    MEAN CORPUSCULAR HGB 30.8    MEAN CORP HGB CONC 33.6    RBC DISTRIBUTION WIDTH STD DEV 47.1 (H)    PLATELET COUNT 243    MEAN PLATELET VOLUME 10.4    NEUTROPHIL % 56.7    IMMATURE GRANULOCYTE % 0.3    LYMPHOCYTE % 33.5    MONOCYTE % 8.0    EOSINOPHIL % 1.3    BASOPHIL % 0.2    NRBC % 0.0    ABSOLUTE NEUTROPHIL COUNT 5.6    ABSOLUTE IMM GRAN COUNT 0.03    ABSOLUTE LYMPH COUNT 3.3    ABSOLUTE MONO COUNT 0.8  ABSOLUTE EOSINOPHIL COUNT 0.1    ABSOLUTE BASO COUNT 0.0    ABSOLUTE NRBC COUNT 0.0   Basic Metabolic Panel    Collection Time: 05/04/24  9:11 AM   Result Value    SODIUM 137    POTASSIUM 4.4    CHLORIDE 102    CARBON DIOXIDE 22    ANION GAP 13    CALCIUM  9.2    Glucose Random 105    BUN (UREA NITROGEN) 7    CREATININE 0.6    ESTIMATED GLOMERULAR FILT RATE > 60    Narrative    Tests added: CPK by  on 05/04/24 at 1103 by [email or web address].Tests  added: MG by Estefana HERO. PA-C on 05/04/24 at 0947 by [email or web address].   Hepatic Function Panel    Collection Time: 05/04/24  9:11 AM   Result Value    TOTAL PROTEIN 7.0    ALBUMIN 4.3    BILIRUBIN TOTAL 0.5    BILIRUBIN DIRECT 0.2    INDIRECT BILIRUBIN 0.3    ALKALINE PHOSPHATASE 100    ASPARTATE AMINOTRANSFERASE 36 (H)    ALANINE AMINOTRANSFERASE 39    Narrative    Tests added: CPK by  on 05/04/24 at 1103 by [email or web address].Tests  added: MG by Estefana HERO. PA-C on 05/04/24 at 0947 by [email or web address].   Hold Green Top [facility] Time: 05/04/24  9:11 AM   Result Value    HOLD GREEN TOP TUBE RECEIVED IN CHEM   Hold Blue  Top Tube    Collection Time: 05/04/24  9:11 AM   Result Value    HOLD BLUE TOP TUBE RECEIVED IN HEMATOL   Magnesium     Collection Time: 05/04/24  9:11 AM   Result Value    MAGNESIUM  2.1    Narrative    Tests added: CPK by  on 05/04/24 at 1103 by [email or web address].Tests  added: MG by Estefana HERO. PA-C on 05/04/24 at 0947 by [email or web address].   Creatine Kinase Total    Collection Time: 05/04/24  9:11 AM   Result Value    CREATINE KINASE TOTAL 69    Narrative    Tests added: CPK by  on 05/04/24 at 1103 by [email or web address].Tests  added: MG by Estefana HERO. PA-C on 05/04/24 at 0947 by [email or web address].      MEDICATIONS ADMINISTERED ON THIS VISIT  Orders Placed This Encounter      acetaminophen  (TYLENOL ) tablet 1,000 mg      cetirizine  (ZYRTEC ) tablet 10 mg      ketorolac  (TORADOL ) injection 15 mg      ibuprofen  (ADVIL ) 600 MG tablet          Sig: Take 1 tablet by mouth every 6 (six) hours as needed for Pain  for up to 5 days          Dispense:  15 tablet          Refill:  0      acetaminophen  (TYLENOL ) 500 MG tablet          Sig: Take 1 tablet by mouth every 6 (six) hours as needed for Pain  for up to 5 days          Dispense:  15 tablet          Refill:  0      cetirizine  (ZYRTEC ) 10 MG tablet          Sig: Take 1 tablet by mouth daily          Dispense:  30 tablet          Refill:  0      ED COURSE & MEDICAL DECISION MAKING      I reviewed the patient's past medical history/problem list, past surgical history, medication list, social history and allergies.  External Records reviewed: 04/13/24 PCP note reviewed, was on omalizumab , referral to allergist/dermatology (appt 10/03/24).  Patient did mention bilateral leg cramps, instructed to trial magnesium  supplementation.           ED Decision Making & Course: Patient is a 63 year old female, denies chronic medical history, who comes emergency room complaining of 29-month history of intermittent urticarial rash now associated with 2-week history of worsening generalized body aches and muscle spasms.  Patient states that she feels  that her hands and feet are very swollen.  Per chart review patient has been on omalizumab  with partial relief, does have upcoming dermatology appointment in 5 months.  Physical examination shows afebrile nontoxic-appearing female.  No urticarial rash noted at this time, however patient does have pictures on her phone which I reviewed.  No palpable localized muscle spasms, no edema, no pitting edema, however patient does have tenderness to light touch over her entire body.  Vital signs reassuring.  Basic lab work obtained, no acute electrolyte disturbance or obvious abnormalities.  Discussed with patient limitations of the emergency room, unclear cause of ongoing symptoms.  Recommend continued outpatient PCP and dermatology follow-up, may benefit from rheumatology in clinic.  Instructed to continue daily medications, Tylenol  ibuprofen  for pain, Zyrtec  for urticaria.  Return for new or worsening acute symptoms or any other concerns.  Patient seen and evaluated by ED attending, Dr. Teddy, who agrees with assessment and plan.  Patient remained hemodynamically stable during their stay in the emergency department.     Diagnosis: generalized pain, myalgias, urticaria  Condition: stable  Disposition: discharge    Follow Up: PCP, dermatology    This Emergency Department patient encounter note was created using voice-recognition software and in real time during the ED visit, please excuse any dictation errors.

## 2024-05-04 NOTE — Narrator Note (Signed)
 Patient Disposition  Patient education for diagnosis, medications, activity, diet and follow-up.  Patient left ED 11:45 AM.  Patient rep received written instructions.    Interpreter to provide instructions: Yes; Interpreter ID:      Patient belongings with patient: YES    Have all existing LDAs been addressed? Yes    Have all IV infusions been stopped? N/A    Destination: Discharged to home    Discharge paperwork reviewed with patient, all questions and concerns addressed  Patient safely ambulating out of ED with belongings

## 2024-05-07 ENCOUNTER — Encounter (HOSPITAL_BASED_OUTPATIENT_CLINIC_OR_DEPARTMENT_OTHER): Payer: Self-pay | Admitting: Family Medicine

## 2024-05-07 ENCOUNTER — Ambulatory Visit: Attending: Family Medicine | Admitting: Family Medicine

## 2024-05-07 ENCOUNTER — Ambulatory Visit
Admission: RE | Admit: 2024-05-07 | Discharge: 2024-05-07 | Disposition: A | Discharge status: Home / Self Care | Attending: Family Medicine | Admitting: Family Medicine

## 2024-05-07 ENCOUNTER — Other Ambulatory Visit: Payer: Self-pay

## 2024-05-07 VITALS — BP 116/74 | HR 94 | Temp 97.5°F | Ht 58.66 in | Wt 155.8 lb

## 2024-05-07 DIAGNOSIS — R52 Pain, unspecified: Secondary | ICD-10-CM | POA: Diagnosis present

## 2024-05-07 DIAGNOSIS — R7309 Other abnormal glucose: Secondary | ICD-10-CM | POA: Diagnosis present

## 2024-05-07 DIAGNOSIS — Z5941 Food insecurity: Secondary | ICD-10-CM | POA: Diagnosis present

## 2024-05-07 DIAGNOSIS — L508 Other urticaria: Secondary | ICD-10-CM | POA: Insufficient documentation

## 2024-05-07 LAB — LYME TOTAL IGG IGM: LYME TOTAL IgG IgM: 0.2 AI (ref 0.0–0.8)

## 2024-05-07 LAB — RHEUMATOID FACTOR: RHEUMATOID FACTOR: <10 [IU]/mL (ref 0–13)

## 2024-05-07 LAB — HEMOGLOBIN A1C
ESTIMATED AVERAGE GLUCOSE: 137 mg/dL (ref 74–160)
HEMOGLOBIN A1C: 6.4 % — ABNORMAL HIGH (ref 4.0–5.6)

## 2024-05-07 LAB — C-REACTIVE PROTEIN: C-REACTIVE PROTEIN: 26 mg/L — ABNORMAL HIGH (ref 0–18)

## 2024-05-07 LAB — RBC SEDIMENTATION RATE: RBC SEDIMENTATION RATE: 16 mm/h (ref 0–30)

## 2024-05-07 NOTE — Patient Instructions (Addendum)
 Please call the office at [phone] to book and appointment for continued evaluation and update       [facility]   [facility]   [address]   [place], [place] [address]   [phone]

## 2024-05-07 NOTE — Progress Notes (Signed)
 [facility]  Office Visit Note     SUBJECTIVE:     Stephanie [name] is a 63 year old female who presents for follow up      # Chronic urticaria/ rash    Appears int he morning  Callus on the feet  Not sure of the trigger   The rash appears every morning  And goes away at 10 am   Am onium lactate on the feet  Cetirizine  is not working   Itching, painful - the rash   Rash have now begun to develop on the eyelids   3-4 mo   Drops for the eyes  Stopped this as not effective  Itchy eyes- struggling to focus when this happens   Has an appt with ophthal   Was on omalizumab    Has no appointments   Struggles with making appt with dermatologist in [facility]    # achiness all over the body    2 months   No h/o anything relieving the pain   Dull pain   Very painful   Intermittent  No factors - exacerbating, alleviating  Unable to work, exercise  Skips meals when she is anxious about her health  No fever  Wishes to have labs to r/o autoimmune cause         REVIEW OF SYSTEMS:  No fever, no cough, no shortness of breath, no chest pain, no dizziness, no palpitations, no nausea, no diarrhea, no lack of smell or lack of taste      OBJECTIVE:       PHYSICAL EXAM:    05/07/24  1321   BP: 116/74   Pulse: 94   Temp: 97.5 F (36.4 C)   SpO2: 97%   Weight: 70.7 kg (155 lb 12.8 oz)   Height: 4' 10.66 (1.49 m)     Constitutional: Well developed, Well nourished, No acute distress, Non-toxic appearance.   HEENT: Normocephalic, Atraumatic, Bilateral external ears normal, Oropharynx moist, No oral exudates, Nose normal, PERRLA.  Cardiovascular: Normal heart rate, Normal rhythm, No murmurs, No rubs, No gallops.   Thorax & Lungs: Normal breath sounds, No respiratory distress, No wheezing, No chest tenderness.     ASSESSMENT AND PLAN:       [name] is a 63 year old female     (R52) Aches  (primary encounter diagnosis)  Comment: generalized, left knee pain , legs hurtm, back hurt  Not all the time  On and off  Struggles to go the gym    Trial alternate days gym   Tylenol , hydration when she does go to the gym  Plan: RBC SEDIMENTATION RATE, SYSTEMIC LUPUS         ERYTHEMATOSUS, C-REACTIVE PROTEIN, RHEUMATOID         FACTOR              (L50.8) Chronic urticaria  Comment:   Plan: fu with allergist at [facility]    (R73.09) Elevated hemoglobin A1c  Comment:   Plan: HEMOGLOBIN A1C          I spent a total of 30 minutes on this visit on the date of service (total time includes all activities performed on the date of service)    1. The patient indicates understanding of these issues and agrees with the plan.  2.  The patient is given an After Visit Summary sheet that lists all of their medications with directions, their allergies, orders placed during this encounter, immunization dates, and follow- up  instructions.  3. I reviewed the patient's medical information and medical history   4.  I reconciled the patient's medication list and prepared and supplied needed refills.  5.  I have reviewed the past medical, family, and social history sections including the medications and allergies listed in the above medical record    We discussed the diagnosis and the importance of medication compliance. The patient was ready to learn and no apparent learning barriers were identified. I explained the diagnosis and treatment plan, and the patient expressed understanding of the content. Possible side effects of the prescribed medication(s) were explained.  I attempted to answer any questions regarding the diagnosis and the proposed treatment.        Electronically signed by: Ileana Gower, MD, 05/07/2024 1:59 PM  This note is electronically signed in the electronic medical record.

## 2024-05-08 ENCOUNTER — Ambulatory Visit (HOSPITAL_BASED_OUTPATIENT_CLINIC_OR_DEPARTMENT_OTHER): Payer: Self-pay | Admitting: Family Medicine

## 2024-05-08 LAB — SYSTEMIC LUPUS ERYTHEMATOSUS
CHROMATIN AB: <0.2 AI (ref 0.0–0.9)
DOUBLE STRANDED DNA AB: 2 [IU]/mL (ref 0–4)
RIBONUCLEOPROTEIN AB: <0.2 AI (ref 0.0–0.9)
RIBOSOMAL P AB: <0.2 AI (ref 0.0–0.9)
SJOGREN'S ANTIBODY SS-A: <0.2 AI (ref 0.0–0.9)
SJOGREN'S ANTIBODY SS-B: <0.2 AI (ref 0.0–0.9)
SMITH AND RIBONUCLEOPROTEIN AB: <0.2 AI (ref 0.0–0.9)
SMITH ANTIBODY: <0.2 AI (ref 0.0–0.9)

## 2024-05-28 ENCOUNTER — Encounter (HOSPITAL_BASED_OUTPATIENT_CLINIC_OR_DEPARTMENT_OTHER): Payer: Self-pay | Admitting: Family Medicine

## 2024-05-28 NOTE — Telephone Encounter (Signed)
 PER who is calling: Patient (self), Stephanie [name] is a 64 year old female has requested a refill of DULoxetine  .    Last Office Visit: 04/16/24    Other Med Adult:  Most Recent BP Reading(s)  05/07/24 : 116/74        Cholesterol (mg/dL)   Date Value   88/86/7975 220     LOW DENSITY LIPOPROTEIN DIRECT (mg/dL)   Date Value   88/86/7975 141     HIGH DENSITY LIPOPROTEIN (mg/dL)   Date Value   88/86/7975 67     TRIGLYCERIDES (mg/dL)   Date Value   95/96/7976 254 (H)         THYROID  SCREEN TSH REFLEX FT4 (uIU/mL)   Date Value   04/16/2024 2.590         TSH (THYROID  STIM HORMONE) (uIU/mL)   Date Value   12/10/2016 0.790       HEMOGLOBIN A1C (%)   Date Value   05/07/2024 6.4 (H)       No results found for: POCA1C      INR (no units)   Date Value   08/03/2021 1.1   04/22/2008 1.0 (L)   02/07/2005 1.0 (L)       SODIUM (mmol/L)   Date Value   05/04/2024 137       POTASSIUM (mmol/L)   Date Value   05/04/2024 4.4           CREATININE (mg/dL)   Date Value   90/94/7974 0.6       Documented patient preferred pharmacies:    [facility], [place] - [address]  Phone: [phone] Fax: [phone]

## 2024-05-29 NOTE — Telephone Encounter (Signed)
 Called patient and left v/m informing of appt tomorrow with her PCP

## 2024-05-30 ENCOUNTER — Ambulatory Visit: Admitting: Family Medicine

## 2024-06-04 ENCOUNTER — Encounter (HOSPITAL_BASED_OUTPATIENT_CLINIC_OR_DEPARTMENT_OTHER): Payer: Self-pay | Admitting: Family Medicine

## 2024-06-13 ENCOUNTER — Telehealth (HOSPITAL_BASED_OUTPATIENT_CLINIC_OR_DEPARTMENT_OTHER): Payer: Self-pay

## 2024-06-13 ENCOUNTER — Encounter (HOSPITAL_BASED_OUTPATIENT_CLINIC_OR_DEPARTMENT_OTHER): Payer: Self-pay

## 2024-06-13 NOTE — Telephone Encounter (Signed)
 Called patient with Interpreter ID # [id number].  Requesting call back to clinic to speak with nurse.   Kathie RN

## 2024-06-13 NOTE — Telephone Encounter (Signed)
 Returned pt call w/ interpreter services [phone]    Per pt both her feet and hands are itchy, scabbing and really dry. Pt states she is having pain from the skin feeling tight. Pt states she is currently using triamcinolone  that was prescribed by Dr Diona. Pt confirms using Rx twice daily but per instructions pt was to apply for two weeks on and two weeks off. Pt states she has been using consistently for two months with no breaks.     Advised pt to stop triamcinolone  and to send photos via mychart of feet and hands for provider to review and get further recommendations. Pt agreed w/ plan will send photos when she is home later today.

## 2024-06-13 NOTE — Telephone Encounter (Signed)
 Returning your call.

## 2024-06-13 NOTE — Telephone Encounter (Signed)
 Avie Pang, Louetta SAILOR.  Sms Nurses Pool41 minutes ago (7:59 AM)     LC  God Morning,    Patient is a former patient of Dr. Diona that has a scheduled appointment w/Dr. Luke in February. Can she see a PA or I can try to find her an appointment w/Dr. Luke sooner than February?    Please see MyChart message below.  Translates to:  My hands and little feet are all hurt.    Please advise.    Best, Louetta Hadassah Cary  P Sms Derm Mychart Scheduling (supporting Marda Alberteen Diona, MD)16 hours ago (4:14 PM)     MS  Appointment Request From: Hadassah Cary     With Provider: Marda Alberteen Diona [facility]]     Preferred Date Range: Any     Preferred Times: Any     Reason for visit: Preciso urgente ver o Dermatologista     Health Maintenance Topic:      Comments:  Minhas mos e meus pezinho esto todos feridos

## 2024-06-15 ENCOUNTER — Encounter (HOSPITAL_BASED_OUTPATIENT_CLINIC_OR_DEPARTMENT_OTHER): Payer: Self-pay | Admitting: Family Medicine

## 2024-06-19 NOTE — Telephone Encounter (Signed)
 Kim, Kubinne, MD to Me  Sms Derm Front Desk  KK      06/19/24 11:33 AM  Patient may follow-up with any of our Derm PA for evaluation of the rash.

## 2024-06-20 ENCOUNTER — Encounter (HOSPITAL_BASED_OUTPATIENT_CLINIC_OR_DEPARTMENT_OTHER): Payer: Self-pay | Admitting: Physician Assistant

## 2024-06-20 ENCOUNTER — Encounter (HOSPITAL_BASED_OUTPATIENT_CLINIC_OR_DEPARTMENT_OTHER): Payer: Self-pay | Admitting: Family Medicine

## 2024-06-20 ENCOUNTER — Encounter (HOSPITAL_BASED_OUTPATIENT_CLINIC_OR_DEPARTMENT_OTHER): Payer: Self-pay | Admitting: Orthopaedic Surgery

## 2024-06-20 DIAGNOSIS — F5104 Psychophysiologic insomnia: Secondary | ICD-10-CM

## 2024-06-20 DIAGNOSIS — Z6833 Body mass index (BMI) 33.0-33.9, adult: Secondary | ICD-10-CM

## 2024-06-20 DIAGNOSIS — G8929 Other chronic pain: Secondary | ICD-10-CM

## 2024-06-20 MED ORDER — MELOXICAM 15 MG PO TABS
15.0000 mg | ORAL_TABLET | Freq: Every day | ORAL | 1 refills | Status: AC
Start: 2024-06-20 — End: 2024-08-19

## 2024-06-20 NOTE — Telephone Encounter (Signed)
 Appt 09/22/2023 with Dermatology.   Kathie RN

## 2024-06-20 NOTE — Telephone Encounter (Signed)
 PER who is calling: Patient (self), Stephanie [name] is a 63 year old female has requested a refill of       Alendronate           Last Office Visit  : 02/10/2024 maple [name], MD      Last Tele Visit:  12/26/2023      Last Physical Exam:  02/21/2013    There are no preventive care reminders to display for this patient.    Other Med Adult:  Most Recent BP Reading(s)  05/07/24 : 116/74        Cholesterol (mg/dL)   Date Value   88/86/7975 220     LOW DENSITY LIPOPROTEIN DIRECT (mg/dL)   Date Value   88/86/7975 141     HIGH DENSITY LIPOPROTEIN (mg/dL)   Date Value   88/86/7975 67     TRIGLYCERIDES (mg/dL)   Date Value   95/96/7976 254 (H)         THYROID  SCREEN TSH REFLEX FT4 (uIU/mL)   Date Value   04/16/2024 2.590         TSH (THYROID  STIM HORMONE) (uIU/mL)   Date Value   12/10/2016 0.790       HEMOGLOBIN A1C (%)   Date Value   05/07/2024 6.4 (H)       No results found for: POCA1C      INR (no units)   Date Value   08/03/2021 1.1   04/22/2008 1.0 (L)   02/07/2005 1.0 (L)       SODIUM (mmol/L)   Date Value   05/04/2024 137       POTASSIUM (mmol/L)   Date Value   05/04/2024 4.4           CREATININE (mg/dL)   Date Value   90/94/7974 0.6       Documented patient preferred pharmacies:    [facility], [place] - [address]  Phone: [phone] Fax: [phone]

## 2024-06-20 NOTE — Telephone Encounter (Signed)
 PER who is calling: Patient (self), Stephanie [name] is a 63 year old female has requested a refill of    meloxicam              Last Office Visit  : 02/10/2024 maple [name], MD      Last Tele Visit:  12/26/2023      Last Physical Exam:  02/21/2013    There are no preventive care reminders to display for this patient.    Other Med Adult:  Most Recent BP Reading(s)  05/07/24 : 116/74        Cholesterol (mg/dL)   Date Value   88/86/7975 220     LOW DENSITY LIPOPROTEIN DIRECT (mg/dL)   Date Value   88/86/7975 141     HIGH DENSITY LIPOPROTEIN (mg/dL)   Date Value   88/86/7975 67     TRIGLYCERIDES (mg/dL)   Date Value   95/96/7976 254 (H)         THYROID  SCREEN TSH REFLEX FT4 (uIU/mL)   Date Value   04/16/2024 2.590         TSH (THYROID  STIM HORMONE) (uIU/mL)   Date Value   12/10/2016 0.790       HEMOGLOBIN A1C (%)   Date Value   05/07/2024 6.4 (H)       No results found for: POCA1C      INR (no units)   Date Value   08/03/2021 1.1   04/22/2008 1.0 (L)   02/07/2005 1.0 (L)       SODIUM (mmol/L)   Date Value   05/04/2024 137       POTASSIUM (mmol/L)   Date Value   05/04/2024 4.4           CREATININE (mg/dL)   Date Value   90/94/7974 0.6       Documented patient preferred pharmacies:    [facility] [place], [place] - [address]  Phone: [phone] Fax: [phone]

## 2024-06-20 NOTE — Telephone Encounter (Signed)
 PER who is calling: Pharmacy, Tahra Hitzeman is a 63 year old female has requested a refill of       estradiol             Last Office Visit  : 05/07/2024 Alphonso Rounds, MD      Last Tele Visit:  12/26/2023      Last Physical Exam:  02/21/2013    There are no preventive care reminders to display for this patient.    Other Med Adult:  Most Recent BP Reading(s)  05/07/24 : 116/74        Cholesterol (mg/dL)   Date Value   88/86/7975 220     LOW DENSITY LIPOPROTEIN DIRECT (mg/dL)   Date Value   88/86/7975 141     HIGH DENSITY LIPOPROTEIN (mg/dL)   Date Value   88/86/7975 67     TRIGLYCERIDES (mg/dL)   Date Value   95/96/7976 254 (H)         THYROID  SCREEN TSH REFLEX FT4 (uIU/mL)   Date Value   04/16/2024 2.590         TSH (THYROID  STIM HORMONE) (uIU/mL)   Date Value   12/10/2016 0.790       HEMOGLOBIN A1C (%)   Date Value   05/07/2024 6.4 (H)       No results found for: POCA1C      INR (no units)   Date Value   08/03/2021 1.1   04/22/2008 1.0 (L)   02/07/2005 1.0 (L)       SODIUM (mmol/L)   Date Value   05/04/2024 137       POTASSIUM (mmol/L)   Date Value   05/04/2024 4.4           CREATININE (mg/dL)   Date Value   90/94/7974 0.6       Documented patient preferred pharmacies:    [facility], [place] - [address]  Phone: [phone] Fax: [phone]

## 2024-06-21 ENCOUNTER — Other Ambulatory Visit: Payer: Self-pay

## 2024-06-21 ENCOUNTER — Ambulatory Visit

## 2024-06-21 DIAGNOSIS — B353 Tinea pedis: Secondary | ICD-10-CM | POA: Diagnosis present

## 2024-06-21 DIAGNOSIS — L859 Epidermal thickening, unspecified: Secondary | ICD-10-CM | POA: Diagnosis present

## 2024-06-21 DIAGNOSIS — B352 Tinea manuum: Secondary | ICD-10-CM | POA: Insufficient documentation

## 2024-06-21 MED ORDER — ESTRADIOL 1.53 MG/SPRAY TD SOLN
1.0000 | Freq: Every day | TRANSDERMAL | 11 refills | Status: AC
Start: 2024-06-21 — End: 2025-06-21

## 2024-06-21 MED ORDER — ALENDRONATE SODIUM 70 MG PO TABS
ORAL_TABLET | ORAL | 4 refills | Status: AC
Start: 2024-06-21 — End: 2025-06-20

## 2024-06-21 MED ORDER — TERBINAFINE HCL 250 MG PO TABS
250.0000 mg | ORAL_TABLET | Freq: Every day | ORAL | 0 refills | Status: AC
Start: 2024-06-21 — End: 2024-07-05

## 2024-06-21 MED ORDER — TRAZODONE HCL 50 MG PO TABS
50.0000 mg | ORAL_TABLET | Freq: Every evening | ORAL | 2 refills | Status: AC
Start: 2024-06-21 — End: 2025-06-21

## 2024-06-21 MED ORDER — UREA 40 % EX CREA
1.0000 | TOPICAL_CREAM | Freq: Every day | CUTANEOUS | 11 refills | Status: AC | PRN
Start: 2024-06-21 — End: 2025-06-21

## 2024-06-21 MED ORDER — LOSARTAN POTASSIUM 25 MG PO TABS
25.0000 mg | ORAL_TABLET | Freq: Every day | ORAL | 3 refills | Status: AC
Start: 2024-06-21 — End: 2025-06-21

## 2024-06-21 MED ORDER — OMEPRAZOLE 40 MG PO CPDR
40.0000 mg | DELAYED_RELEASE_CAPSULE | Freq: Every day | ORAL | 3 refills | Status: AC
Start: 2024-06-21 — End: 2025-06-21

## 2024-06-21 NOTE — Progress Notes (Signed)
 Dermatology Note: Established Patient    HPI:     Stephanie [name] is a very pleasant 63 year old female who presents today for her bilateral hands and feet.   She notes significant scaling/flaking throughout the palms/plantar feet and pain at the small toes ongoing for several weeks to months.     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 eyes     Depression, recurrent (HCC)     Trauma and stressor-related disorder     Nontraumatic complete tear of right rotator cuff     Localized osteoarthritis of left knee     Obesity (BMI 30-39.9)     Chronic pain of left knee     Closed fracture of left distal radius     Fatty liver     Prediabetes     Surgical menopause     Generalized anxiety disorder     Eyelid lesion     Primary osteoarthritis of right knee      Current Medications:  Current Outpatient Medications   Medication Sig    losartan  (COZAAR ) 25 MG tablet Take 1 tablet by mouth daily    omeprazole  (PRILOSEC) 40 MG capsule Take 1 capsule by mouth daily    traZODone  (DESYREL ) 50 MG tablet Take 1 tablet by mouth nightly    meloxicam  (MOBIC ) 15 MG tablet Take 1 tablet by mouth daily Take with food    ammonium lactate  (LAC-HYDRIN ) 12 % lotion Apply topically as needed for Dry skin    estradiol  (EVAMIST ) 1.53 MG/SPRAY transdermal spray Place 1 spray onto the skin daily    Omalizumab  300 MG/2ML SOAJ Inject 300 mg under the skin every 30 (thirty) days    ciclopirox  (LOPROX ) 0.77 % SUSP Apply 1 drop topically in the morning and 1 drop before bedtime.    DULoxetine  (CYMBALTA ) 60 MG capsule Take 1 capsule by mouth daily Stop taking duloxetine  30mg     carboxymethylcellulose (REFRESH TEARS)  0.5 % SOLN Place 1 drop into both eyes in the morning and 1 drop at noon and 1 drop in the evening and 1 drop before bedtime. (Patient not taking: Reported on 02/10/2024)    atorvastatin  (LIPITOR) 40 MG tablet Take 1 tablet by mouth daily    alendronate  (FOSAMAX ) 70 MG tablet Take 70mg  every 7 days with 8 oz of water  on empty stomach.  Do not take anything else by mouth and stay upright (do not lie down) for 30 min until 1st food of day.    Multiple Vitamin (MULTIVITAMIN) TABS Take 1 tablet by mouth in the morning.    Glucosamine 750 MG TABS Take by mouth    Omega-3 Fatty Acids (FISH OIL) 1000 MG CAPS Take by mouth    Turmeric 500 MG CAPS Take by mouth     No current facility-administered medications for this visit.       Allergies:  Review of Patient's Allergies indicates:  No Known Allergies        Pt is WD, WN, comfortable, alert and oriented    PHYSICAL EXAM  Examination was performed of the hands/feet    - Hyperkeratosis and scaling of the plantar feet bilaterally with fissuring at the left 5th toe  - Bilateral palms demonstrate thin scale             Lab Results:    ALBUMIN (g/dL)   Date Value   90/94/7974 4.3     ALKALINE PHOSPHATASE (U/L)   Date Value   05/04/2024 100     ALANINE AMINOTRANSFERASE (U/L)   Date Value   05/04/2024 39     ASPARTATE AMINOTRANSFERASE (U/L)   Date Value   05/04/2024 36 (H)     Glucose Random (mg/dL)   Date Value   90/94/7974 105     BUN (UREA NITROGEN) (mg/dL)   Date Value   90/94/7974 7     CALCIUM  (mg/dL)   Date Value   90/94/7974 9.2     CHLORIDE (mmol/L)   Date Value   05/04/2024 102     CARBON DIOXIDE (mmol/L)   Date Value   05/04/2024 22     CREATININE (mg/dL)   Date Value   90/94/7974 0.6     POTASSIUM (mmol/L)   Date Value   05/04/2024 4.4     SODIUM (mmol/L)   Date Value   05/04/2024 137     BILIRUBIN TOTAL (mg/dL)   Date Value   90/94/7974 0.5     TOTAL PROTEIN (g/dL)   Date Value   90/94/7974 7.0      Assessment/Plan:    Tinea Pedis  Tinea Manuum  Hyperkeratosis   -  Hyperkeratosis and scaling of the plantar feet bilaterally with fissuring at the left 5th toe, and bilateral palms demonstrate thin scale   - Reviewed treatment options including topical vs oral medication. 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  - Start Terbinafine 250mg  daily x 14 days  - Hold duloxetine  while taking due to increased risk of SS. Patient is not sure why she was prescribed this medication but reports her mood is good/stable. She reports she has held this in the past without issue  - Recommend wearing gloves while cleaning to prevent additional infection and avoid chemical irritants to the skin  - Start Urea 40% cream nightly to the plantar feet for hyperkeratosis     RTC in 1 month       Rosina Granville, PA-C  06/21/2024

## 2024-06-21 NOTE — Telephone Encounter (Signed)
 PER who is calling: Patient (self), Stephanie [name] is a 63 year old female has requested a refill of          traZODone  (DESYREL ) 50 MG tablet   .        Last Office Visit  : 05/07/2024 Alphonso Rounds, MD      Last Tele Visit:  12/26/2023      Last Physical Exam:  02/21/2013    There are no preventive care reminders to display for this patient.    Other Med Adult:  Most Recent BP Reading(s)  05/07/24 : 116/74        Cholesterol (mg/dL)   Date Value   88/86/7975 220     LOW DENSITY LIPOPROTEIN DIRECT (mg/dL)   Date Value   88/86/7975 141     HIGH DENSITY LIPOPROTEIN (mg/dL)   Date Value   88/86/7975 67     TRIGLYCERIDES (mg/dL)   Date Value   95/96/7976 254 (H)         THYROID  SCREEN TSH REFLEX FT4 (uIU/mL)   Date Value   04/16/2024 2.590         TSH (THYROID  STIM HORMONE) (uIU/mL)   Date Value   12/10/2016 0.790       HEMOGLOBIN A1C (%)   Date Value   05/07/2024 6.4 (H)       No results found for: POCA1C      INR (no units)   Date Value   08/03/2021 1.1   04/22/2008 1.0 (L)   02/07/2005 1.0 (L)       SODIUM (mmol/L)   Date Value   05/04/2024 137       POTASSIUM (mmol/L)   Date Value   05/04/2024 4.4           CREATININE (mg/dL)   Date Value   90/94/7974 0.6       Documented patient preferred pharmacies:    [facility], [place] - [address]  Phone: [phone] Fax: [phone]

## 2024-06-21 NOTE — Telephone Encounter (Signed)
 PER who is calling: Patient (self), Zyair Rhein is a 63 year old female has requested a refill of     -  losartan  & omeprazole       Last Office Visit: 12/26/23 with Segura-Harrison, M  Last Physical Exam: 02/21/2013    There are no preventive care reminders to display for this patient.    Other Med Adult:  Most Recent BP Reading(s)  05/07/24 : 116/74        Cholesterol (mg/dL)   Date Value   88/86/7975 220     LOW DENSITY LIPOPROTEIN DIRECT (mg/dL)   Date Value   88/86/7975 141     HIGH DENSITY LIPOPROTEIN (mg/dL)   Date Value   88/86/7975 67     TRIGLYCERIDES (mg/dL)   Date Value   95/96/7976 254 (H)         THYROID  SCREEN TSH REFLEX FT4 (uIU/mL)   Date Value   04/16/2024 2.590         TSH (THYROID  STIM HORMONE) (uIU/mL)   Date Value   12/10/2016 0.790       HEMOGLOBIN A1C (%)   Date Value   05/07/2024 6.4 (H)       No results found for: POCA1C      INR (no units)   Date Value   08/03/2021 1.1   04/22/2008 1.0 (L)   02/07/2005 1.0 (L)       SODIUM (mmol/L)   Date Value   05/04/2024 137       POTASSIUM (mmol/L)   Date Value   05/04/2024 4.4           CREATININE (mg/dL)   Date Value   90/94/7974 0.6       Documented patient preferred pharmacies:    [facility] [place], [place] - [address]  Phone: [phone] Fax: [phone]

## 2024-07-09 ENCOUNTER — Encounter (HOSPITAL_BASED_OUTPATIENT_CLINIC_OR_DEPARTMENT_OTHER): Payer: Self-pay | Admitting: Family Medicine

## 2024-07-09 DIAGNOSIS — G8929 Other chronic pain: Secondary | ICD-10-CM

## 2024-07-09 DIAGNOSIS — Z6833 Body mass index (BMI) 33.0-33.9, adult: Secondary | ICD-10-CM

## 2024-07-17 NOTE — Progress Notes (Unsigned)
 Dermatology Note: Established Patient    HPI:     Stephanie [name] is a very pleasant 63 year old female who presents today in follow up for tinea mannus/pedis.    At her last visit 1 month ago, she was prescribed oral Terbinafine 250mg  daily x 14 days. She was also instructed to apply Ureo 40% cream to feet for hyperkeratosis.    Today, she reports ***     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 eyes     Depression, recurrent (HCC)     Trauma and stressor-related disorder     Nontraumatic complete tear of right rotator cuff     Localized osteoarthritis of left knee     Obesity (BMI 30-39.9)     Chronic pain of left knee     Closed fracture of left distal radius     Fatty liver     Prediabetes     Surgical menopause     Generalized anxiety disorder     Eyelid lesion     Primary osteoarthritis of right knee      Current Medications:  Current Outpatient Medications   Medication Sig    estradiol  (EVAMIST ) 1.53 MG/SPRAY transdermal spray Place 1 spray onto the skin daily    losartan  (COZAAR ) 25 MG tablet Take 1 tablet by mouth daily    omeprazole  (PRILOSEC) 40 MG capsule Take 1 capsule by mouth daily    alendronate  (FOSAMAX ) 70 MG tablet Take 70mg  every 7 days with 8 oz of water  on empty stomach.  Do not take anything else by mouth and stay upright (do not lie down) for 30 min until 1st food of day.    traZODone  (DESYREL ) 50 MG tablet Take 1 tablet by mouth nightly    urea (CARMOL) 40 % CREA Apply 1 Application topically daily as needed    meloxicam  (MOBIC ) 15 MG tablet Take 1 tablet by mouth daily Take with food    ammonium lactate  (LAC-HYDRIN ) 12 % lotion Apply topically as needed  for Dry skin    Omalizumab  300 MG/2ML SOAJ Inject 300 mg under the skin every 30 (thirty) days    ciclopirox  (LOPROX ) 0.77 % SUSP Apply 1 drop topically in the morning and 1 drop before bedtime.    DULoxetine  (CYMBALTA ) 60 MG capsule Take 1 capsule by mouth daily Stop taking duloxetine  30mg     carboxymethylcellulose (REFRESH TEARS) 0.5 % SOLN Place 1 drop into both eyes in the morning and 1 drop at noon and 1 drop in the evening and 1 drop before bedtime. (Patient not taking: Reported on 02/10/2024)    atorvastatin  (LIPITOR) 40 MG tablet Take 1 tablet by mouth daily    Multiple Vitamin (MULTIVITAMIN) TABS Take 1 tablet by mouth in the morning.    Glucosamine 750 MG TABS Take by mouth    Omega-3 Fatty Acids (FISH OIL) 1000 MG CAPS Take by mouth    Turmeric 500 MG CAPS Take by mouth     No current facility-administered medications for this visit.  Allergies:  Review of Patient's Allergies indicates:  No Known Allergies        Pt is WD, WN, comfortable, alert and oriented    PHYSICAL EXAM    Examination was performed of the hands/feet    - Hyperkeratosis and scaling of the plantar feet bilaterally with fissuring at the left 5th toe  - Bilateral palms demonstrate thin scale             Lab Results:    ALBUMIN (g/dL)   Date Value   90/94/7974 4.3     ALKALINE PHOSPHATASE (U/L)   Date Value   05/04/2024 100     ALANINE AMINOTRANSFERASE (U/L)   Date Value   05/04/2024 39     ASPARTATE AMINOTRANSFERASE (U/L)   Date Value   05/04/2024 36 (H)     Glucose Random (mg/dL)   Date Value   90/94/7974 105     BUN (UREA NITROGEN) (mg/dL)   Date Value   90/94/7974 7     CALCIUM  (mg/dL)   Date Value   90/94/7974 9.2     CHLORIDE (mmol/L)   Date Value   05/04/2024 102     CARBON DIOXIDE (mmol/L)   Date Value   05/04/2024 22     CREATININE (mg/dL)   Date Value   90/94/7974 0.6     POTASSIUM (mmol/L)   Date Value   05/04/2024 4.4     SODIUM (mmol/L)   Date Value   05/04/2024 137     BILIRUBIN TOTAL (mg/dL)   Date Value   90/94/7974  0.5     TOTAL PROTEIN (g/dL)   Date Value   90/94/7974 7.0      Assessment/Plan:    Tinea Pedis  Tinea Manuum  Hyperkeratosis   - Hyperkeratosis and scaling of the plantar feet bilaterally with fissuring at the left 5th toe, and bilateral palms demonstrate thin scale   - Reviewed treatment options including topical vs oral medication. 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  - Start Terbinafine 250mg  daily x 14 days  - Hold duloxetine  while taking due to increased risk of SS. Patient is not sure why she was prescribed this medication but reports her mood is good/stable. She reports she has held this in the past without issue  - Recommend wearing gloves while cleaning to prevent additional infection and avoid chemical irritants to the skin  - Start Urea 40% cream nightly to the plantar feet for hyperkeratosis     RTC in 1 month       Rosina Granville, PA-C  07/17/2024

## 2024-07-18 ENCOUNTER — Ambulatory Visit

## 2024-07-30 DIAGNOSIS — M1711 Unilateral primary osteoarthritis, right knee: Secondary | ICD-10-CM | POA: Insufficient documentation

## 2024-08-03 ENCOUNTER — Ambulatory Visit

## 2024-08-09 ENCOUNTER — Ambulatory Visit (HOSPITAL_BASED_OUTPATIENT_CLINIC_OR_DEPARTMENT_OTHER): Payer: Self-pay

## 2024-08-09 ENCOUNTER — Encounter (HOSPITAL_BASED_OUTPATIENT_CLINIC_OR_DEPARTMENT_OTHER): Payer: Self-pay

## 2024-08-09 ENCOUNTER — Emergency Department
Admission: AD | Admit: 2024-08-09 | Discharge: 2024-08-09 | Disposition: A | Discharge status: Home / Self Care | Source: Ambulatory Visit | Attending: Surgery | Admitting: Surgery

## 2024-08-09 ENCOUNTER — Emergency Department (HOSPITAL_BASED_OUTPATIENT_CLINIC_OR_DEPARTMENT_OTHER)

## 2024-08-09 ENCOUNTER — Other Ambulatory Visit: Payer: Self-pay

## 2024-08-09 DIAGNOSIS — B9789 Other viral agents as the cause of diseases classified elsewhere: Secondary | ICD-10-CM | POA: Insufficient documentation

## 2024-08-09 DIAGNOSIS — R059 Cough, unspecified: Secondary | ICD-10-CM | POA: Diagnosis present

## 2024-08-09 DIAGNOSIS — N3000 Acute cystitis without hematuria: Secondary | ICD-10-CM | POA: Diagnosis not present

## 2024-08-09 DIAGNOSIS — J069 Acute upper respiratory infection, unspecified: Secondary | ICD-10-CM | POA: Insufficient documentation

## 2024-08-09 DIAGNOSIS — M545 Low back pain, unspecified: Secondary | ICD-10-CM | POA: Diagnosis not present

## 2024-08-09 DIAGNOSIS — M5489 Other dorsalgia: Secondary | ICD-10-CM | POA: Insufficient documentation

## 2024-08-09 LAB — RESPIRATORY PANEL BASIC INPAT
INFLUENZA A: NEGATIVE
INFLUENZA B: NEGATIVE
RESPIRATORY SYNCYTIAL VIRUS: NEGATIVE
SARS-COV-2: NEGATIVE

## 2024-08-09 LAB — POC URINALYSIS
BILIRUBIN, URINE: NEGATIVE
GLUCOSE,URINE: 100 — AB
NITRITE, URINE: POSITIVE — AB
OCCULT BLOOD, URINE: NEGATIVE
PH URINE: 5 (ref 5.0–8.0)
PROTEIN, URINE: 100 — AB
SPECIFIC GRAVITY, URINE: 1.01 (ref 1.003–1.030)
UROBILINOGEN URINE: 1 (ref 0.2–1.0)

## 2024-08-09 MED ORDER — BENZONATATE 100 MG PO CAPS: 100.0000 mg | ORAL_CAPSULE | Freq: Three times a day (TID) | ORAL | 0 refills | Status: AC | PRN

## 2024-08-09 MED ORDER — IBUPROFEN 600 MG PO TABS
600.0000 mg | ORAL_TABLET | Freq: Once | ORAL | Status: AC
  Administered 2024-08-09: 600 mg via ORAL
  Filled 2024-08-09: qty 1

## 2024-08-09 MED ORDER — DIPHENHYDRAMINE HCL 25 MG PO TABS: 25.0000 mg | ORAL_TABLET | Freq: Four times a day (QID) | ORAL | 0 refills | Status: AC | PRN

## 2024-08-09 MED ORDER — ACETAMINOPHEN 325 MG PO TABS: 650.0000 mg | ORAL_TABLET | Freq: Four times a day (QID) | ORAL | 0 refills | Status: AC | PRN

## 2024-08-09 MED ORDER — DIPHENHYDRAMINE HCL 25 MG PO CAPS
25.0000 mg | ORAL_CAPSULE | Freq: Once | ORAL | Status: AC
  Administered 2024-08-09: 25 mg via ORAL
  Filled 2024-08-09: qty 1

## 2024-08-09 MED ORDER — IBUPROFEN 600 MG PO TABS: 600.0000 mg | ORAL_TABLET | Freq: Four times a day (QID) | ORAL | 0 refills | Status: AC | PRN

## 2024-08-09 MED ORDER — CEFPODOXIME PROXETIL 200 MG PO TABS: 200.0000 mg | ORAL_TABLET | Freq: Two times a day (BID) | ORAL | 0 refills | Status: AC

## 2024-08-09 NOTE — UC Provider Notes (Signed)
 Chief Complaint: Back Pain      History of Present Illness:    This 63 year old female patient presents with with multiple complaints  Patient has back pain for the last 1 months on and off lower back she stated last night her pain is worse  No fever focal weakness or numbness  No urinary stool incontinence or retention  No fever  Fall or injury    Also patient is complaining since yesterday of cough congestion and runny nose  Chest pain or shortness of breath      Allergies:  Review of Patient's Allergies indicates:  No Known Allergies      Physical Exam:  GENERAL:  63 year old female in no apparent distress.    VITAL SIGNS:  BP 121/77   Pulse 96   Temp 97.2 F   Resp 20   Wt 70.3 kg (155 lb)   LMP 07/11/2005   SpO2 98%   BMI 31.67 kg/m     HEENT:  Head atraumatic.  PERRL.  Extraocular muscles intact.  Neck is supple.  CHEST:  Lungs are clear to auscultation bilaterally.  No wheeze or crackles.   CARDIAC:  RRR,   BACK:  No midline ttp, no flank ttp  ABDOMEN:  Soft, nontender, non distended, no obvious organomegaly.   EXTREMITIES:  Warm and well perfused. Pulses are 2+ bilateral.  No edema.    SKIN:  Warm and dry.  No rash.    NEUROLOGIC: Alert and appropriate.         Urgent Care Course and Medical Decision Making:    For back pain will obtain lumbar x-ray and UA  Lumbar x-ray is negative for acute pathology  UA is positive for infection will discharge with antibiotics    For cough congestion and subjective fever  Chest x-ray showed no acute pathology  Patient likely has viral symptoms will discharge with symptomatic treatment and PCP follow-up      Orders:   Orders Placed This Encounter      XR Chest 2 views          Standing Status: Standing          Number of Occurrences: 1          Critical Signs and Symptoms: cough          To assure imaging is appropriate for clinical symptoms or due to patient restrictions, this order may be changed to reflect patient needs.: Yes          Release result to patient  via [facility]: Immediate [1]      XR Lumbar Spine 2 or 3 views          Standing Status: Standing          Number of Occurrences: 1          Critical Signs and Symptoms: back pain          To assure imaging is appropriate for clinical symptoms or due to patient restrictions, this order may be changed to reflect patient needs.: Yes          Release result to patient via [facility]: Immediate [1]      POC Urinalysis          Standing Status: Standing          Number of Occurrences: 1      Respiratory Panel Basic Inpat          Standing Status: Standing  Number of Occurrences: 1          Release result to patient via [facility]: Immediate [1]      ibuprofen  (ADVIL ) tablet 600 mg      diphenhydrAMINE  (BENADRYL ) capsule 25 mg      ibuprofen  (ADVIL ) 600 MG tablet          Sig: Take 1 tablet by mouth every 6 (six) hours as needed for Pain  for up to 5 days          Dispense:  20 tablet          Refill:  0      diphenhydrAMINE  (BENADRYL ) 25 MG tablet          Sig: Take 1 tablet by mouth every 6 (six) hours as needed for Itching, Allergies or Sleep (Cough)  for up to 5 days          Dispense:  20 tablet          Refill:  0      cefpodoxime  (VANTIN ) 200 MG tablet          Sig: Take 1 tablet by mouth in the morning and 1 tablet before bedtime. Do all this for 7 days.          Dispense:  14 tablet          Refill:  0      acetaminophen  (TYLENOL ) 325 MG tablet          Sig: Take 2 tablets by mouth every 6 (six) hours as needed for Pain  for up to 5 days          Dispense:  20 tablet          Refill:  0      benzonatate  (TESSALON ) 100 MG capsule          Sig: Take 1 capsule by mouth 3 (three) times daily as needed for Cough  for up to 5 days          Dispense:  16 capsule          Refill:  0          Results:  Results for orders placed or performed during the hospital encounter of 08/09/24 (from the past 24 hours)   POC Urinalysis    Collection Time: 08/09/24 11:07 AM   Result Value    COLOR ORANGE (A)    CLARITY CLEAR     GLUCOSE,URINE 100 (A)    BILIRUBIN, URINE NEGATIVE    KETONE, URINE TRACE (A)    SPECIFIC GRAVITY, URINE 1.010    UROBILINOGEN URINE 1.0    OCCULT BLOOD, URINE NEGATIVE    PH URINE 5.0    PROTEIN, URINE 100 (A)    NITRITE, URINE POSITIVE (A)    LEUKOCYTE ESTERASE LARGE (A)               PROVISIONARY DIAGNOSIS:  Viral URI  Other acute back pain  Acute cystitis without hematuria              Linus Blackwater, MD, 08/09/2024

## 2024-08-09 NOTE — UC Triage Notes (Signed)
 Back pain and left breast pain x 2 weeks.  Took tylenol  this am

## 2024-08-09 NOTE — Narrator Note (Signed)
Patient Disposition  Patient education for diagnosis, medications, activity, diet and follow-up.  Patient left UC 11:44 AM.  Patient rep received written instructions.    Interpreter to provide instructions: No    Patient belongings with patient: YES    Have all existing LDAs been addressed? N/A    Have all IV infusions been stopped? N/A    Destination: Home

## 2024-08-09 NOTE — Telephone Encounter (Signed)
 Difficult triage, vague answers to direct questions. Multiple complaints not consistent with original sx.   I spoke with Adult Back Pain Triage     I spoke with Patient       Chief Complaint: multi sx,  back pain kidney pain   Lump over the breast, under the arm like everyone has, intermittent calf cramping      Current Day of Symptoms: 2 wks getting worse had to leave work yesterday     Patient Active Problem List:     BMI 33.0-33.9,adult     Pure hypercholesterolemia     Family history of malignant neoplasm of gastrointestinal tract     Tubular adenoma of colon     Hyperopia with astigmatism and presbyopia     Immature cataract     Hypertension, goal below 140/90     Adenomatous polyp of colon     Squamous 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 eyes     Depression, recurrent (HCC)     Trauma and stressor-related disorder     Nontraumatic complete tear of right rotator cuff     Localized osteoarthritis of left knee     Obesity (BMI 30-39.9)     Chronic pain of left knee     Closed fracture of left distal radius     Fatty liver     Prediabetes     Surgical menopause     Generalized anxiety disorder     Eyelid lesion     Primary osteoarthritis of right knee        Review of Patient's Allergies indicates:  No Known Allergies     Emergency care:     Severe Difficulty Breathing:  No     Difficult to Awaken or Acting Confused:  No    Shock Suspected:  No    SEVERE Back Pain of Sudden Onset > 60yo:  No    SEVERE Abdominal Pain:  No    Abdominal Pain >60yo:  No    Unable to Urinate and bladder feels very full:  No    Loss of Bowel or Bladder Control:  No    Numbness in groin or rectal area:  No     The patient has the following symptoms:     Location:  lower side of waist b/l    Radiation: has pain in belly of the leg not coming form back     Onset:  gradual    Duration:  2 wks    Pattern:  Constant    Severity:  severe not able to go to work    Neurologic Symptoms: left leg cramps in calf, no swelling  cramps intermittent no hx of blood clots,  hx of TKR 2 yrs ago cramp lasting for few seconds. Wakes her at hs as well     Patient believed caused by?: reports hx of Kidney problems in past had to stay in hospital  denies hx of kidney stones, unclear what the dx was.     Other Symptoms:  dark urine, burning with strong odor, reports fever last night was sweating, took tylenol  sx resolved, no n/v     Pregnancy:  N/A    Home Remedies:  Tylenol  helped for 2 hours, urinary tract relief OTC     Advised per nursing triage protocol.      Recommended disposition for patient:  Disposition: Advised to go to Urgent Care for Evaluation due to appointment availablity    If patient referred  to UC/ED advised that they may require further follow up and testing after the visit with their primary care office.     Instructed patient to call back for any new, worsening, or worrisome symptoms or concerns any time day or night.    Telephone Call Outcome:  Single Call Resolution        Reason for Disposition   Pain or burning with passing urine (urination)    Protocols used: Back Pain-A-OH

## 2024-08-28 ENCOUNTER — Emergency Department
Admission: AD | Admit: 2024-08-28 | Discharge: 2024-08-28 | Disposition: A | Discharge status: Home / Self Care | Source: Ambulatory Visit

## 2024-08-28 ENCOUNTER — Ambulatory Visit (HOSPITAL_BASED_OUTPATIENT_CLINIC_OR_DEPARTMENT_OTHER): Payer: Self-pay | Admitting: Emergency Medicine

## 2024-08-28 ENCOUNTER — Ambulatory Visit (HOSPITAL_BASED_OUTPATIENT_CLINIC_OR_DEPARTMENT_OTHER): Payer: Self-pay | Admitting: Registered Nurse

## 2024-08-28 ENCOUNTER — Other Ambulatory Visit: Payer: Self-pay

## 2024-08-28 DIAGNOSIS — S39011A Strain of muscle, fascia and tendon of abdomen, initial encounter: Secondary | ICD-10-CM | POA: Diagnosis not present

## 2024-08-28 DIAGNOSIS — R059 Cough, unspecified: Secondary | ICD-10-CM | POA: Insufficient documentation

## 2024-08-28 DIAGNOSIS — B9789 Other viral agents as the cause of diseases classified elsewhere: Secondary | ICD-10-CM | POA: Insufficient documentation

## 2024-08-28 DIAGNOSIS — X58XXXA Exposure to other specified factors, initial encounter: Secondary | ICD-10-CM | POA: Insufficient documentation

## 2024-08-28 DIAGNOSIS — J988 Other specified respiratory disorders: Secondary | ICD-10-CM | POA: Diagnosis present

## 2024-08-28 LAB — RESPIRATORY PANEL BASIC INPAT
INFLUENZA A: POSITIVE
INFLUENZA B: NEGATIVE
RESPIRATORY SYNCYTIAL VIRUS: NEGATIVE
SARS-COV-2: NEGATIVE

## 2024-08-28 MED ORDER — ACETAMINOPHEN 500 MG PO TABS: 500.0000 mg | ORAL_TABLET | Freq: Four times a day (QID) | ORAL | 0 refills | Status: AC | PRN

## 2024-08-28 MED ORDER — LIDOCAINE 4 % EX PTCH
1.0000 | MEDICATED_PATCH | Freq: Once | CUTANEOUS | Status: DC
  Administered 2024-08-28: 1 via TOPICAL
  Filled 2024-08-28: qty 1

## 2024-08-28 MED ORDER — LIDOCAINE 5 % EX PTCH: 1.0000 | MEDICATED_PATCH | Freq: Two times a day (BID) | CUTANEOUS | 0 refills | Status: AC

## 2024-08-28 MED ORDER — GUAIFENESIN 100 MG/5ML PO SYRP: 200.0000 mg | ORAL_SOLUTION | Freq: Three times a day (TID) | ORAL | 0 refills | Status: AC | PRN

## 2024-08-28 MED ORDER — ACETAMINOPHEN 500 MG PO TABS
1000.0000 mg | ORAL_TABLET | Freq: Once | ORAL | Status: AC
  Administered 2024-08-28: 1000 mg via ORAL
  Filled 2024-08-28: qty 2

## 2024-08-28 MED ORDER — IBUPROFEN 600 MG PO TABS: 600.0000 mg | ORAL_TABLET | Freq: Four times a day (QID) | ORAL | 0 refills | Status: AC | PRN

## 2024-08-28 MED ORDER — KETOROLAC TROMETHAMINE 30 MG/ML INJ
15.0000 mg | Freq: Once | Status: AC
  Administered 2024-08-28: 15 mg via INTRAMUSCULAR
  Filled 2024-08-28: qty 1

## 2024-08-28 NOTE — UC Triage Notes (Signed)
 Patient presents here today with coughing onset 4 days ago with low grade fever of 99 degrees on Saturday.

## 2024-08-28 NOTE — Discharge Instructions (Signed)
 You were seen in the ER today for signs and symptoms that are most consistent with viral respiratory infection, such as COVID or influenza  Testing: a viral swab was sent;  this should result in the next 12-24 hours. Please see your health portal for results.   To do:   Tylenol  1000 mg every 6 hours as needed for pain / fever   If instructed you may also take ibuprofen  400 - 600 mg every 6 hours as needed for pain/ fever   For cough: tea with honey, and cough medications such as robitussin can be helpful.   Please stay adequately hydrated     For abdominal pain:   Warm compresses   Lidocaine  patches     Please return to the emergency department immediately for any shortness of breath or difficulty breathing worsening status or any other concerns

## 2024-08-28 NOTE — UC Provider Notes (Signed)
 Care language: Portuguese ([place]ian); Interpreter used.             HPI:  This 63 year old female patient presents with chief complaint of        2-3 days of subjective fevers chills, generalized malaise, sore throat cough, congestion. cough.  No  nausea vomiting or diarrhea. Reports some right sided abd pain but only with coughing and this developed after the cough.  Taking over-the-counter medication with limited relief.  No shortness of breath syncope or chest pain.           Past Medical History/Problem List:  Past Medical History:  02/12/2016: Chronic right shoulder pain  No date: Depression  03/20/2012: Diverticulosis of colon      Comment:  Noted on colonoscopy 6/13   08/16/2010: Elevated hemoglobin A1c      Comment:  HEMOGLOBIN A1C (%) Date Value 03/06/2015 5.6                05/20/2014 5.4 05/08/2012 6.0 (H)   No results found for:               POCA1C   01/04/2023: Eyelid lesion      Comment:  Right lower eyelid lesion  11/21/2012: Hyperopia with astigmatism and presbyopia  No date: Hypertension  11/21/2012: Immature cataract  01/21/2012: Impingement syndrome of right shoulder      Comment:  10/14: pt taking  [place]ian med (meloxicam  7.5 mg BID                and cyclobenzaprine ); Will ask for refill once supply                finished; PCP ok with refill of flexeril  on temporary                basis;   01/26/2008: Irritable bladder      Comment:  Seen by Dr Maylon 5/09, no incontinence, likely                irritable bladder.  Trial of anticholinergic, refer to                urology, has appt Dr Rilla 02/23/08  02/16/2005: Leiomyoma of uterus, unspecified      Comment:  Dr Maylon hysterectomy November, 2006; cervix retained,               last pap 11/11 neg Repeat pap/HPV 07/2015   02/15/2014: Mass of hand  No date: OSA (obstructive sleep apnea)  07/30/2011: Other plastic surgery for unacceptable cosmetic appearance      Comment:  Breast augmentation scheduled 08/02/11;  Dr.  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, left  01/08/2016: Schistosomiasis  08/13/2016: Squamous 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   eyes  06/18/2004: Urinary calculus, unspecified  Patient Active Problem List:     BMI 33.0-33.9,adult     Pure hypercholesterolemia     Family history of malignant neoplasm of gastrointestinal tract     Tubular adenoma of colon     Hyperopia with astigmatism and presbyopia     Immature cataract     Hypertension, goal below 140/90     Adenomatous polyp of colon     Squamous blepharitis of  upper and lower eyelids of both eyes     Depression, recurrent (HCC)     Trauma and stressor-related disorder     Nontraumatic complete tear of right rotator cuff     Localized osteoarthritis of left knee     Obesity (BMI 30-39.9)     Chronic pain of left knee     Closed fracture of left distal radius     Fatty liver     Prediabetes     Surgical menopause     Generalized anxiety disorder     Eyelid lesion     Primary osteoarthritis of right knee      Past Surgical History:  Past Surgical History:  No date: BREAST ENHANCEMENT SURGERY      Comment:  12/12 had bilateral mammoplasty and saline implants, had               2nd surgery 5/30 for revision  01/04/2023: EXC LESION EYELID W/O CLSR/W/SIMPLE DIR CLOSURE; Right      Comment:  s/p excision of eyelid lesion, right lower eyelid,                laterally, by VJP II  No date: MAMMAPLASTY AUGMENTATION W/PROSTHETIC IMPLANT  No date: MASTOPEXY  No date: OB ANTEPARTUM CARE CESAREAN DLVR & POSTPARTUM      Comment:  x3  No date: REDUCTION MAMMAPLASTY  No date: TOTAL ABDOMINAL HYSTERECT W/WO RMVL TUBE OVARY      Comment:  for fibroids, still has cervix  No date: TOTAL KNEE REPLACEMENT; Left      Comment:  2/23    Medications:   Current Facility-Administered Medications   Medication    lidocaine   (SALONPAS) 4 % patch 1 patch     Current Outpatient Medications   Medication Sig    ibuprofen  (ADVIL ) 600 MG tablet Take 1 tablet by mouth every 6 (six) hours as needed for Pain  for up to 7 days    acetaminophen  (TYLENOL ) 500 MG tablet Take 1 tablet by mouth every 6 (six) hours as needed for Pain  for up to 10 days    guaifenesin  (ROBITUSSIN) 100 MG/5ML syrup Take 10 mLs by mouth 3 (three) times daily as needed for Cough  for up to 7 days    lidocaine  (LIDODERM ) 5 % patch Place 1 patch onto the skin in the morning and 1 patch before bedtime. SABRA Patch(es) may remain in place for up to 12 hours in any 24-hour period. Do all this for 7 days.    estradiol  (EVAMIST ) 1.53 MG/SPRAY transdermal spray Place 1 spray onto the skin daily    losartan  (COZAAR ) 25 MG tablet Take 1 tablet by mouth daily    omeprazole  (PRILOSEC) 40 MG capsule Take 1 capsule by mouth daily    alendronate  (FOSAMAX ) 70 MG tablet Take 70mg  every 7 days with 8 oz of water on empty stomach.  Do not take anything else by mouth and stay upright (do not lie down) for 30 min until 1st food of day.    traZODone  (DESYREL ) 50 MG tablet Take 1 tablet by mouth nightly    urea  (CARMOL) 40 % CREA Apply 1 Application topically daily as needed    meloxicam  (MOBIC ) 15 MG tablet Take 1 tablet by mouth daily Take with food    ammonium lactate  (LAC-HYDRIN ) 12 % lotion Apply topically as needed for Dry skin    Omalizumab  300 MG/2ML SOAJ Inject 300 mg under the skin every 30 (thirty) days  ciclopirox  (LOPROX ) 0.77 % SUSP Apply 1 drop topically in the morning and 1 drop before bedtime.    DULoxetine  (CYMBALTA ) 60 MG capsule Take 1 capsule by mouth daily Stop taking duloxetine  30mg     carboxymethylcellulose (REFRESH TEARS) 0.5 % SOLN Place 1 drop into both eyes in the morning and 1 drop at noon and 1 drop in the evening and 1 drop before bedtime. (Patient not taking: No sig reported)    atorvastatin  (LIPITOR) 40 MG tablet Take 1 tablet by mouth daily    Multiple Vitamin  (MULTIVITAMIN) TABS Take 1 tablet by mouth daily    Glucosamine 750 MG TABS Take by mouth    Omega-3 Fatty Acids (FISH OIL) 1000 MG CAPS Take by mouth    Turmeric 500 MG CAPS Take by mouth         Allergies:  Review of Patient's Allergies indicates:  No Known Allergies    Physical Exam:  ED Triage Vitals [08/28/24 1553]   ED Triage Vitals Brief Group      Temp 98 F      Pulse 72      Resp 14      BP 113/75      SpO2 96 %      Pain Score 9      GENERAL: No acute distress.   SKIN:  Warm & Dry  HEAD  PERRL. EOMI.  Oropharynx: clear, throat erythema   NECK: Supple. No LAN.   LUNGS:  Clear to auscultation bilaterally. No wheezes, rales, rhonchi.   HEART:  RRR.  No murmurs, rubs, or gallops.   ABDOMEN:  Soft, nd. Some ttp right sided abdomen, negative Murphy sign no McBurney's point tenderness.  No guarding or rebound tenderness.   MUSCULOSKELETAL:  No obvious deformities.    NEUROLOGIC: Alert and oriented.  Moves all extremities well.  PSYCHIATRIC:  Appropriate for age, time of day, and situation        UC Course and Medical Decision-making:    The patient is 63 year old female with   signs and symptoms that are most consistent with a suspected viral upper respiratory tract infection, the patient has cough congestion, is nontoxic-appearing, vitals are reassuring, lungs are clear to auscultation.  Neck supple do not suspect meningitis encephalitis, lungs clear no hypoxia, do not suspect bacterial pneumonia, no urinary symptoms.  Patient does have abdominal tenderness but this appears to be musculoskeletal in nature, will encourage the patient is coughing.   no indication for antibiotics at this time   okay for discharge with further supportive care for suspected respiratory viral condition   Advised to return to the ER should the patient have any worsening symptoms or concerns regarding their condition including but not limited to worsening shortness of breath    Additional verbal discharge instructions were provided  including patients diagnosis and follow up plan, as well as reasons to return to Urgent Care which were discussed in detail.  Patient is agreeable with this management.         Condition: Stable    Disposition:  Discharge    Diagnosis/Diagnoses:  Viral respiratory illness  Strain of abdominal muscle, initial encounter        Morene Revering, MD   Emergency Medicine Attending Physician  [facility]

## 2024-08-28 NOTE — Narrator Note (Signed)
 Patient Disposition  Patient education for diagnosis, medications, activity, diet and follow-up.  Patient left UC 4:33 PM.  Patient rep received written instructions.    Interpreter to provide instructions: No    Patient belongings with patient: YES    Have all existing LDAs been addressed? N/A    Have all IV infusions been stopped? N/A    Destination: Home instructions reviewed,

## 2024-08-28 NOTE — Telephone Encounter (Signed)
 Adult Respiratory Triage    I spoke with Patient:    Chief Complaint: dry cough since Saturday        Patient Active Problem List:     BMI 33.0-33.9,adult     Pure hypercholesterolemia     Family history of malignant neoplasm of gastrointestinal tract     Tubular adenoma of colon     Hyperopia with astigmatism and presbyopia     Immature cataract     Hypertension, goal below 140/90     Adenomatous polyp of colon     Squamous 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 eyes     Depression, recurrent (HCC)     Trauma and stressor-related disorder     Nontraumatic complete tear of right rotator cuff     Localized osteoarthritis of left knee     Obesity (BMI 30-39.9)     Chronic pain of left knee     Closed fracture of left distal radius     Fatty liver     Prediabetes     Surgical menopause     Generalized anxiety disorder     Eyelid lesion     Primary osteoarthritis of right knee    Review of Patient's Allergies indicates:  No Known Allergies    Emergency care:     Is the patient gasping for air or unable to speak? No    Does the patient have new severe chest pain? No    Is the patient lethargic or does the patient have altered mental status?  No     The patient has the following symptoms:     TC from pt to NAL  Certified Portuguese speaking RN confirmed pt's name, DOB and demographics  She is calling as has had a dry cough that hurts her abdomen since Sat  No SOB  No chest pain  No wheezing  Coughing fits keep her up at night  No PMH COPD/asthma  Coughing throughout phone call      Fever: Low grade fevers     If fever, duration of fever: Since Saturday    Nasal Congestion:  Denies    Nasal Discharge:  Denies    Cough: Dry    Sputum: Denies    Difficulty Breathing:  Denies    Worsening Symptoms from Baseline:  Reports    Chest Pain:  No pain    Other symptoms/pertinent information: None    COVID: not performed    Home Remedies: Theraflu, Tylenol , Ibuprofen , Robitussin    Advised per nursing triage protocol.   Verbalized understanding and agreement with instructions and disposition.     Was seen at [facility] on 08/09/24, dx'ed with viral URI  CXR was clear  Not given any meds    Recommended disposition for patient:  Disposition: Advised to go to Urgent Care for Evaluation due to appointment availablity    If patient referred to UC/ED advised that they may require further follow up and testing after the visit with their primary care office.     Instructed patient to call back for any new, worsening, or worrisome symptoms or concerns any time day or night.    Telephone Call Outcome:  Single Call Resolution                    Reason for Disposition   Patient wants to be seen    Answer Assessment - Initial Assessment Questions  .    Protocols used: Cough-A-OH

## 2024-08-29 ENCOUNTER — Ambulatory Visit: Admitting: Dermatology

## 2024-08-29 MED ORDER — OSELTAMIVIR PHOSPHATE 75 MG PO CAPS: 75.0000 mg | ORAL_CAPSULE | Freq: Two times a day (BID) | ORAL | 0 refills | Status: AC

## 2024-08-31 ENCOUNTER — Ambulatory Visit: Admitting: Physician Assistant

## 2024-09-04 ENCOUNTER — Ambulatory Visit

## 2024-09-07 ENCOUNTER — Other Ambulatory Visit: Payer: Self-pay

## 2024-09-07 ENCOUNTER — Ambulatory Visit (HOSPITAL_BASED_OUTPATIENT_CLINIC_OR_DEPARTMENT_OTHER): Payer: Self-pay | Admitting: Internal Medicine

## 2024-09-07 ENCOUNTER — Ambulatory Visit: Attending: Family Medicine | Admitting: Family Medicine

## 2024-09-07 VITALS — BP 135/81 | HR 86 | Temp 97.3°F | Wt 155.0 lb

## 2024-09-07 DIAGNOSIS — L853 Xerosis cutis: Secondary | ICD-10-CM | POA: Insufficient documentation

## 2024-09-07 DIAGNOSIS — R058 Other specified cough: Secondary | ICD-10-CM | POA: Insufficient documentation

## 2024-09-07 MED ORDER — AMMONIUM LACTATE 12 % EX LOTN: TOPICAL_LOTION | CUTANEOUS | 3 refills | Status: AC | PRN

## 2024-09-07 MED ORDER — BENZONATATE 100 MG PO CAPS: 100.0000 mg | ORAL_CAPSULE | Freq: Three times a day (TID) | ORAL | 0 refills | Status: AC | PRN

## 2024-09-07 NOTE — Progress Notes (Signed)
 CC: sick visit    #)sick visit  -15 days of symptoms  -diagnosed with flu last week, completed tamiflu   -all symptoms have resolved, just ongoing cough and some wheezing symptoms  -has used multiple cough syrups  -cough is improving    #)dry skin  -on feet, wonders what to do about this    ROS:  -no fever    O:  BP 135/81   Pulse 86   Temp 97.3 F (36.3 C) (Temporal)   Wt 70.3 kg (155 lb)   LMP 07/11/2005   SpO2 97%   BMI 31.67 kg/m   Gen: NAD  HEENT: MMM  CV: S1, S2, RRR  Pulm: CTAB  Psych: normal speech and thought  Skin: some dry, thickened skin on feet bilaterally      (R05.8) Post-viral cough syndrome  Comment: overall improving, I have no concern for a postviral pneumonia at this time. Rx provided for tessalon  perles take three times daily as needed for cough, counseled to not let these sit on tongue and dissolve but to swallow  Encouraged to drink plenty of water, tea with lemon and honey. Advised that cough will gradually improve  Plan:     (L85.3) Dry skin  Comment: refilled amlactin. Rec'd use of epsom salt soak and pumice stone as well  Plan:

## 2024-09-07 NOTE — Telephone Encounter (Signed)
 Adult Respiratory Triage    I spoke with Patient:    Chief Complaint: Cough.    Current day of symptoms: 15 days.    Patient Active Problem List:     BMI 33.0-33.9,adult     Pure hypercholesterolemia     Family history of malignant neoplasm of gastrointestinal tract     Tubular adenoma of colon     Hyperopia with astigmatism and presbyopia     Immature cataract     Hypertension, goal below 140/90     Adenomatous polyp of colon     Squamous 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 eyes     Depression, recurrent (HCC)     Trauma and stressor-related disorder     Nontraumatic complete tear of right rotator cuff     Localized osteoarthritis of left knee     Obesity (BMI 30-39.9)     Chronic pain of left knee     Closed fracture of left distal radius     Fatty liver     Prediabetes     Surgical menopause     Generalized anxiety disorder     Eyelid lesion     Primary osteoarthritis of right knee    Review of Patient's Allergies indicates:  No Known Allergies    Emergency care:     Is the patient gasping for air or unable to speak? No    Does the patient have new severe chest pain? No    Is the patient lethargic or does the patient have altered mental status?  No     The patient has the following symptoms:     Coughing a lot ad wheezing a little bit.    Reports, I went to the emergency department and got medicine for flu and already took all the medicine.    Fever: Denies     If fever, duration of fever: N/A    Nasal Congestion:  Denies    Nasal Discharge:  Denies    Cough: Dry    Sputum: Denies    Difficulty Breathing:  Wheezing a little bit.    Worsening Symptoms from Baseline:  Denies    Chest Pain:  Denies chest pain.    Other symptoms/pertinent information: None expressed.    COVID: not performed    Home Remedies: None expressed.    Advised per nursing triage protocol.  Verbalized understanding and agreement with instructions and disposition.     Recommended disposition for patient:  Disposition: See in  Office Today    If patient referred to UC/ED advised that they may require further follow up and testing after the visit with their primary care office.     Instructed patient to call back for any new, worsening, or worrisome symptoms or concerns any time day or night.    Telephone Call Outcome:  Single Call Resolution    SEE IN OFFICE TODAY OR TOMORROW:     * You need to be examined. Let me give you an appointment.  Per patient's request, scheduled patient an appointment with Dr. Crisoforo at [facility] ([facility] on today, 09/07/2024 at 10:30 am.    * IF NO AVAILABLE OFFICE APPOINTMENTS: You need to be seen in an Urgent Care Center. Go to the one at nearest urgent care. Go there today. A nearby Urgent Care Center is often a good source of care.    Reason for Disposition   Patient wants to be seen    Protocols used:  Common Cold-A-[place]

## 2024-09-24 DIAGNOSIS — M1711 Unilateral primary osteoarthritis, right knee: Secondary | ICD-10-CM | POA: Insufficient documentation

## 2024-09-26 ENCOUNTER — Encounter (HOSPITAL_BASED_OUTPATIENT_CLINIC_OR_DEPARTMENT_OTHER): Payer: Self-pay | Admitting: Family Medicine

## 2024-09-26 DIAGNOSIS — F5104 Psychophysiologic insomnia: Secondary | ICD-10-CM

## 2024-09-26 DIAGNOSIS — G8929 Other chronic pain: Secondary | ICD-10-CM

## 2024-09-26 DIAGNOSIS — Z6833 Body mass index (BMI) 33.0-33.9, adult: Secondary | ICD-10-CM

## 2024-09-28 ENCOUNTER — Encounter (HOSPITAL_BASED_OUTPATIENT_CLINIC_OR_DEPARTMENT_OTHER): Payer: Self-pay | Admitting: Family Medicine

## 2024-09-28 DIAGNOSIS — Z6833 Body mass index (BMI) 33.0-33.9, adult: Secondary | ICD-10-CM

## 2024-09-28 DIAGNOSIS — F5104 Psychophysiologic insomnia: Secondary | ICD-10-CM

## 2024-09-28 DIAGNOSIS — G8929 Other chronic pain: Secondary | ICD-10-CM

## 2024-10-03 ENCOUNTER — Ambulatory Visit: Admitting: Dermatology

## 2024-10-09 ENCOUNTER — Ambulatory Visit: Admitting: Surgical
